# Patient Record
Sex: Male | Born: 1940
Health system: Southern US, Community
[De-identification: ages and names within clinical notes are randomized; demographics above are authoritative.]

## PROBLEM LIST (undated history)

## (undated) DIAGNOSIS — N62 Hypertrophy of breast: Secondary | ICD-10-CM

## (undated) DIAGNOSIS — F32A Depression, unspecified: Secondary | ICD-10-CM

## (undated) DIAGNOSIS — I4510 Unspecified right bundle-branch block: Secondary | ICD-10-CM

## (undated) DIAGNOSIS — M1990 Unspecified osteoarthritis, unspecified site: Secondary | ICD-10-CM

## (undated) DIAGNOSIS — Z720 Tobacco use: Secondary | ICD-10-CM

## (undated) DIAGNOSIS — M4850XA Collapsed vertebra, not elsewhere classified, site unspecified, initial encounter for fracture: Secondary | ICD-10-CM

## (undated) DIAGNOSIS — N400 Enlarged prostate without lower urinary tract symptoms: Secondary | ICD-10-CM

## (undated) DIAGNOSIS — R739 Hyperglycemia, unspecified: Secondary | ICD-10-CM

## (undated) DIAGNOSIS — J449 Chronic obstructive pulmonary disease, unspecified: Secondary | ICD-10-CM

## (undated) DIAGNOSIS — Z87442 Personal history of urinary calculi: Secondary | ICD-10-CM

## (undated) DIAGNOSIS — I209 Angina pectoris, unspecified: Secondary | ICD-10-CM

## (undated) DIAGNOSIS — IMO0001 Reserved for inherently not codable concepts without codable children: Secondary | ICD-10-CM

## (undated) DIAGNOSIS — F329 Major depressive disorder, single episode, unspecified: Secondary | ICD-10-CM

## (undated) DIAGNOSIS — G4733 Obstructive sleep apnea (adult) (pediatric): Secondary | ICD-10-CM

## (undated) DIAGNOSIS — M549 Dorsalgia, unspecified: Secondary | ICD-10-CM

## (undated) DIAGNOSIS — Z9989 Dependence on other enabling machines and devices: Secondary | ICD-10-CM

## (undated) DIAGNOSIS — E538 Deficiency of other specified B group vitamins: Secondary | ICD-10-CM

## (undated) DIAGNOSIS — G8929 Other chronic pain: Secondary | ICD-10-CM

## (undated) DIAGNOSIS — E119 Type 2 diabetes mellitus without complications: Secondary | ICD-10-CM

## (undated) DIAGNOSIS — I2510 Atherosclerotic heart disease of native coronary artery without angina pectoris: Secondary | ICD-10-CM

## (undated) DIAGNOSIS — M5134 Other intervertebral disc degeneration, thoracic region: Secondary | ICD-10-CM

## (undated) DIAGNOSIS — R7303 Prediabetes: Secondary | ICD-10-CM

## (undated) DIAGNOSIS — M51379 Other intervertebral disc degeneration, lumbosacral region without mention of lumbar back pain or lower extremity pain: Secondary | ICD-10-CM

## (undated) DIAGNOSIS — I219 Acute myocardial infarction, unspecified: Secondary | ICD-10-CM

## (undated) DIAGNOSIS — E785 Hyperlipidemia, unspecified: Secondary | ICD-10-CM

## (undated) DIAGNOSIS — M5030 Other cervical disc degeneration, unspecified cervical region: Secondary | ICD-10-CM

## (undated) DIAGNOSIS — F419 Anxiety disorder, unspecified: Secondary | ICD-10-CM

## (undated) DIAGNOSIS — K219 Gastro-esophageal reflux disease without esophagitis: Secondary | ICD-10-CM

## (undated) DIAGNOSIS — K279 Peptic ulcer, site unspecified, unspecified as acute or chronic, without hemorrhage or perforation: Secondary | ICD-10-CM

## (undated) DIAGNOSIS — M545 Low back pain, unspecified: Secondary | ICD-10-CM

## (undated) DIAGNOSIS — D369 Benign neoplasm, unspecified site: Secondary | ICD-10-CM

## (undated) DIAGNOSIS — M5137 Other intervertebral disc degeneration, lumbosacral region: Secondary | ICD-10-CM

## (undated) HISTORY — DX: Other chronic pain: G89.29

## (undated) HISTORY — PX: CORONARY ANGIOPLASTY: SHX604

## (undated) HISTORY — DX: Benign neoplasm, unspecified site: D36.9

## (undated) HISTORY — DX: Depression, unspecified: F32.A

## (undated) HISTORY — DX: Benign prostatic hyperplasia without lower urinary tract symptoms: N40.0

## (undated) HISTORY — PX: CARDIAC CATHETERIZATION: SHX172

## (undated) HISTORY — DX: Major depressive disorder, single episode, unspecified: F32.9

## (undated) HISTORY — PX: JOINT REPLACEMENT: SHX530

## (undated) HISTORY — DX: Hyperglycemia, unspecified: R73.9

## (undated) HISTORY — DX: Hypertrophy of breast: N62

## (undated) HISTORY — DX: Deficiency of other specified B group vitamins: E53.8

## (undated) HISTORY — PX: CATARACT EXTRACTION W/ INTRAOCULAR LENS  IMPLANT, BILATERAL: SHX1307

## (undated) HISTORY — DX: Low back pain, unspecified: M54.50

## (undated) HISTORY — DX: Low back pain: M54.5

## (undated) HISTORY — PX: EYE SURGERY: SHX253

## (undated) HISTORY — PX: PENILE PROSTHESIS IMPLANT: SHX240

## (undated) HISTORY — PX: COLONOSCOPY W/ POLYPECTOMY: SHX1380

## (undated) HISTORY — DX: Atherosclerotic heart disease of native coronary artery without angina pectoris: I25.10

## (undated) HISTORY — DX: Collapsed vertebra, not elsewhere classified, site unspecified, initial encounter for fracture: M48.50XA

---
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
## 1968-02-26 DIAGNOSIS — M4850XA Collapsed vertebra, not elsewhere classified, site unspecified, initial encounter for fracture: Secondary | ICD-10-CM

## 1968-02-26 HISTORY — DX: Collapsed vertebra, not elsewhere classified, site unspecified, initial encounter for fracture: M48.50XA

## 1975-02-26 HISTORY — PX: NASAL SEPTUM SURGERY: SHX37

## 1999-03-26 ENCOUNTER — Encounter: Payer: Self-pay | Admitting: Urology

## 1999-03-30 ENCOUNTER — Observation Stay (HOSPITAL_COMMUNITY): Admission: RE | Admit: 1999-03-30 | Discharge: 1999-03-31 | Payer: Self-pay | Admitting: Urology

## 1999-08-26 HISTORY — PX: SCROTAL SURGERY: SHX2387

## 1999-09-17 ENCOUNTER — Ambulatory Visit (HOSPITAL_BASED_OUTPATIENT_CLINIC_OR_DEPARTMENT_OTHER): Admission: RE | Admit: 1999-09-17 | Discharge: 1999-09-17 | Payer: Self-pay | Admitting: Urology

## 2001-01-30 ENCOUNTER — Encounter: Payer: Self-pay | Admitting: Rheumatology

## 2001-01-30 ENCOUNTER — Encounter: Admission: RE | Admit: 2001-01-30 | Discharge: 2001-01-30 | Payer: Self-pay | Admitting: Rheumatology

## 2001-02-13 ENCOUNTER — Ambulatory Visit (HOSPITAL_COMMUNITY): Admission: RE | Admit: 2001-02-13 | Discharge: 2001-02-13 | Payer: Self-pay | Admitting: Cardiovascular Disease

## 2001-02-13 ENCOUNTER — Encounter: Payer: Self-pay | Admitting: Cardiovascular Disease

## 2001-02-16 ENCOUNTER — Encounter: Admission: RE | Admit: 2001-02-16 | Discharge: 2001-02-16 | Payer: Self-pay | Admitting: Rheumatology

## 2001-02-16 ENCOUNTER — Encounter: Payer: Self-pay | Admitting: Rheumatology

## 2001-02-25 HISTORY — PX: BYPASS GRAFT POPLITEAL TO POPLITEAL: SHX5763

## 2001-03-13 ENCOUNTER — Inpatient Hospital Stay (HOSPITAL_COMMUNITY): Admission: RE | Admit: 2001-03-13 | Discharge: 2001-03-15 | Payer: Self-pay

## 2005-02-25 DIAGNOSIS — D369 Benign neoplasm, unspecified site: Secondary | ICD-10-CM

## 2005-02-25 HISTORY — DX: Benign neoplasm, unspecified site: D36.9

## 2005-05-23 ENCOUNTER — Encounter: Admission: RE | Admit: 2005-05-23 | Discharge: 2005-05-23 | Payer: Self-pay | Admitting: Rheumatology

## 2005-05-26 HISTORY — PX: LOWER EXTREMITY ANGIOGRAM: SHX5955

## 2005-06-12 ENCOUNTER — Ambulatory Visit (HOSPITAL_COMMUNITY): Admission: RE | Admit: 2005-06-12 | Discharge: 2005-06-12 | Payer: Self-pay | Admitting: Vascular Surgery

## 2005-06-25 HISTORY — PX: BYPASS GRAFT POPLITEAL TO POPLITEAL: SHX5763

## 2005-06-27 ENCOUNTER — Inpatient Hospital Stay (HOSPITAL_COMMUNITY): Admission: RE | Admit: 2005-06-27 | Discharge: 2005-06-29 | Payer: Self-pay | Admitting: Vascular Surgery

## 2006-02-28 ENCOUNTER — Encounter: Admission: RE | Admit: 2006-02-28 | Discharge: 2006-02-28 | Payer: Self-pay | Admitting: Rheumatology

## 2006-05-06 ENCOUNTER — Encounter: Admission: RE | Admit: 2006-05-06 | Discharge: 2006-05-06 | Payer: Self-pay | Admitting: Geriatric Medicine

## 2006-05-14 ENCOUNTER — Encounter: Admission: RE | Admit: 2006-05-14 | Discharge: 2006-05-14 | Payer: Self-pay | Admitting: Geriatric Medicine

## 2006-11-28 ENCOUNTER — Encounter: Admission: RE | Admit: 2006-11-28 | Discharge: 2006-11-28 | Payer: Self-pay | Admitting: Geriatric Medicine

## 2007-02-26 DIAGNOSIS — I2510 Atherosclerotic heart disease of native coronary artery without angina pectoris: Secondary | ICD-10-CM

## 2007-02-26 HISTORY — DX: Atherosclerotic heart disease of native coronary artery without angina pectoris: I25.10

## 2007-10-02 ENCOUNTER — Ambulatory Visit: Payer: Self-pay | Admitting: Vascular Surgery

## 2007-11-26 HISTORY — PX: ESOPHAGOGASTRODUODENOSCOPY (EGD) WITH ESOPHAGEAL DILATION: SHX5812

## 2007-12-01 ENCOUNTER — Encounter: Admission: RE | Admit: 2007-12-01 | Discharge: 2007-12-01 | Payer: Self-pay | Admitting: Cardiology

## 2007-12-04 ENCOUNTER — Ambulatory Visit (HOSPITAL_COMMUNITY): Admission: RE | Admit: 2007-12-04 | Discharge: 2007-12-04 | Payer: Self-pay | Admitting: Cardiology

## 2007-12-17 ENCOUNTER — Encounter (INDEPENDENT_AMBULATORY_CARE_PROVIDER_SITE_OTHER): Payer: Self-pay | Admitting: Gastroenterology

## 2007-12-17 ENCOUNTER — Ambulatory Visit (HOSPITAL_COMMUNITY): Admission: RE | Admit: 2007-12-17 | Discharge: 2007-12-17 | Payer: Self-pay | Admitting: Gastroenterology

## 2008-12-15 ENCOUNTER — Ambulatory Visit: Payer: Self-pay | Admitting: Vascular Surgery

## 2009-01-25 HISTORY — PX: TOTAL KNEE ARTHROPLASTY: SHX125

## 2009-02-06 ENCOUNTER — Inpatient Hospital Stay (HOSPITAL_COMMUNITY): Admission: RE | Admit: 2009-02-06 | Discharge: 2009-02-08 | Payer: Self-pay | Admitting: Orthopedic Surgery

## 2009-02-12 ENCOUNTER — Ambulatory Visit: Payer: Self-pay | Admitting: Vascular Surgery

## 2009-02-12 ENCOUNTER — Encounter (INDEPENDENT_AMBULATORY_CARE_PROVIDER_SITE_OTHER): Payer: Self-pay | Admitting: Emergency Medicine

## 2009-02-12 ENCOUNTER — Ambulatory Visit (HOSPITAL_COMMUNITY): Admission: EM | Admit: 2009-02-12 | Discharge: 2009-02-12 | Payer: Self-pay | Admitting: Emergency Medicine

## 2009-06-25 DIAGNOSIS — N62 Hypertrophy of breast: Secondary | ICD-10-CM

## 2009-06-25 HISTORY — DX: Hypertrophy of breast: N62

## 2009-07-05 ENCOUNTER — Encounter: Admission: RE | Admit: 2009-07-05 | Discharge: 2009-07-05 | Payer: Self-pay | Admitting: Geriatric Medicine

## 2009-11-08 ENCOUNTER — Encounter: Admission: RE | Admit: 2009-11-08 | Discharge: 2009-11-08 | Payer: Self-pay | Admitting: Geriatric Medicine

## 2010-03-18 ENCOUNTER — Encounter: Payer: Self-pay | Admitting: Geriatric Medicine

## 2010-05-28 LAB — DIFFERENTIAL
Basophils Absolute: 0 10*3/uL (ref 0.0–0.1)
Basophils Relative: 1 % (ref 0–1)
Eosinophils Absolute: 0.1 10*3/uL (ref 0.0–0.7)
Eosinophils Relative: 2 % (ref 0–5)
Lymphocytes Relative: 25 % (ref 12–46)
Lymphs Abs: 1.4 10*3/uL (ref 0.7–4.0)
Monocytes Absolute: 0.6 K/uL (ref 0.1–1.0)
Monocytes Relative: 11 % (ref 3–12)
Neutro Abs: 3.5 K/uL (ref 1.7–7.7)
Neutrophils Relative %: 62 % (ref 43–77)

## 2010-05-28 LAB — CBC
HCT: 28 % — ABNORMAL LOW (ref 39.0–52.0)
Hemoglobin: 9.6 g/dL — ABNORMAL LOW (ref 13.0–17.0)
MCHC: 34.4 g/dL (ref 30.0–36.0)
MCV: 91.9 fL (ref 78.0–100.0)
Platelets: 250 10*3/uL (ref 150–400)
RBC: 3.05 MIL/uL — ABNORMAL LOW (ref 4.22–5.81)
RDW: 14.2 % (ref 11.5–15.5)
WBC: 5.7 10*3/uL (ref 4.0–10.5)

## 2010-05-28 LAB — POCT CARDIAC MARKERS
CKMB, poc: 2.7 ng/mL (ref 1.0–8.0)
Myoglobin, poc: 278 ng/mL (ref 12–200)
Troponin i, poc: <0.05 ng/mL (ref 0.00–0.09)

## 2010-05-28 LAB — APTT: aPTT: 34 seconds (ref 24–37)

## 2010-05-28 LAB — PROTIME-INR
INR: 1.72 — ABNORMAL HIGH (ref 0.00–1.49)
Prothrombin Time: 20 s — ABNORMAL HIGH (ref 11.6–15.2)

## 2010-05-29 LAB — CBC
HCT: 32.1 % — ABNORMAL LOW (ref 39.0–52.0)
Hemoglobin: 11.1 g/dL — ABNORMAL LOW (ref 13.0–17.0)
Hemoglobin: 14.4 g/dL (ref 13.0–17.0)
MCHC: 34.7 g/dL (ref 30.0–36.0)
MCHC: 35.3 g/dL (ref 30.0–36.0)
MCV: 92 fL (ref 78.0–100.0)
MCV: 92.8 fL (ref 78.0–100.0)
Platelets: 134 10*3/uL — ABNORMAL LOW (ref 150–400)
Platelets: 155 10*3/uL (ref 150–400)
Platelets: 197 10*3/uL (ref 150–400)
RBC: 3.34 MIL/uL — ABNORMAL LOW (ref 4.22–5.81)
RDW: 13.5 % (ref 11.5–15.5)
RDW: 13.7 % (ref 11.5–15.5)
WBC: 6.4 10*3/uL (ref 4.0–10.5)

## 2010-05-29 LAB — URINALYSIS, ROUTINE W REFLEX MICROSCOPIC
Bilirubin Urine: NEGATIVE
Hgb urine dipstick: NEGATIVE
Protein, ur: NEGATIVE mg/dL
Specific Gravity, Urine: 1.018 (ref 1.005–1.030)
pH: 6.5 (ref 5.0–8.0)

## 2010-05-29 LAB — DIFFERENTIAL
Eosinophils Relative: 3 % (ref 0–5)
Lymphocytes Relative: 36 % (ref 12–46)
Monocytes Relative: 8 % (ref 3–12)
Neutrophils Relative %: 52 % (ref 43–77)

## 2010-05-29 LAB — COMPREHENSIVE METABOLIC PANEL
ALT: 22 U/L (ref 0–53)
AST: 23 U/L (ref 0–37)
Albumin: 3.7 g/dL (ref 3.5–5.2)
Alkaline Phosphatase: 73 U/L (ref 39–117)
BUN: 12 mg/dL (ref 6–23)
CO2: 28 mEq/L (ref 19–32)
Calcium: 9.1 mg/dL (ref 8.4–10.5)
Chloride: 101 mEq/L (ref 96–112)
Creatinine, Ser: 0.78 mg/dL (ref 0.4–1.5)
Glucose, Bld: 93 mg/dL (ref 70–99)
Total Bilirubin: 0.6 mg/dL (ref 0.3–1.2)
Total Protein: 6.8 g/dL (ref 6.0–8.3)

## 2010-05-29 LAB — PROTIME-INR
INR: 1 (ref 0.00–1.49)
INR: 1.39 (ref 0.00–1.49)
Prothrombin Time: 16.9 seconds — ABNORMAL HIGH (ref 11.6–15.2)

## 2010-05-29 LAB — BASIC METABOLIC PANEL
BUN: 10 mg/dL (ref 6–23)
BUN: 8 mg/dL (ref 6–23)
CO2: 27 mEq/L (ref 19–32)
CO2: 29 mEq/L (ref 19–32)
Calcium: 8.6 mg/dL (ref 8.4–10.5)
Chloride: 99 mEq/L (ref 96–112)
Creatinine, Ser: 0.82 mg/dL (ref 0.4–1.5)
GFR calc Af Amer: >60 mL/min (ref >60)
GFR calc non Af Amer: >60 mL/min (ref >60)
Glucose, Bld: 149 mg/dL — ABNORMAL HIGH (ref 70–99)
Glucose, Bld: 150 mg/dL — ABNORMAL HIGH (ref 70–99)
Potassium: 3.9 mEq/L (ref 3.5–5.1)
Sodium: 133 mEq/L — ABNORMAL LOW (ref 135–145)

## 2010-05-29 LAB — URINE CULTURE: Culture: NO GROWTH

## 2010-05-29 LAB — APTT: aPTT: 31 seconds (ref 24–37)

## 2010-05-29 LAB — URINE MICROSCOPIC-ADD ON

## 2010-05-29 LAB — TYPE AND SCREEN: Antibody Screen: NEGATIVE

## 2010-07-10 NOTE — Procedures (Signed)
BYPASS GRAFT EVALUATION   INDICATION:  Followup left lower extremity bypass graft.   HISTORY:  Diabetes:  No.  Cardiac:  CAD.  Hypertension:  Yes.  Smoking:  Yes.  Previous Surgery:  Left superficial femoral artery to popliteal artery  bypass graft on 06/27/2005.   SINGLE LEVEL ARTERIAL EXAM                               RIGHT              LEFT  Brachial:                    131                139  Anterior tibial:             139                153  Posterior tibial:            172                176  Peroneal:  Ankle/brachial index:        1.24               1.27   PREVIOUS ABI:  Date:  01/31/2006  RIGHT:  >1.0  LEFT:  >1.0   LOWER EXTREMITY BYPASS GRAFT DUPLEX EXAM:   DUPLEX:  Biphasic Doppler waveforms noted throughout the left lower  extremity bypass graft and its native vessels with no increase in  Doppler velocities noted.   IMPRESSION:  1. Patent left superficial femoral to popliteal artery bypass graft      with no evidence of stenosis.  2. Stable bilateral ankle brachial indices noted.   ___________________________________________  Larina Earthly, M.D.   CH/MEDQ  D:  10/02/2007  T:  10/02/2007  Job:  [id number]

## 2010-07-10 NOTE — Op Note (Signed)
[name], [name] [name] NO.:  [id number]   MEDICAL RECORD NO.:  [id number]          PATIENT TYPE:  AMB   LOCATION:  ENDO                         FACILITY:  [facility]   PHYSICIAN:  Petra Kuba, M.D.    DATE OF BIRTH:  1940-04-09   DATE OF PROCEDURE:  12/17/2007  DATE OF DISCHARGE:                               OPERATIVE REPORT   PROCEDURE:  Esophagogastroduodenoscopy with Savary dilatation.   INDICATIONS:  Patient with atypical chest pain, dysphagia.   Consent was signed after risks, benefits, methods and options thoroughly  discussed multiple times in the past.   MEDICINES USED:  Per Anesthesia, a combination of Diprivan and other  medicines.   DESCRIPTION OF PROCEDURE:  The video endoscope was inserted via direct  vision.  He had a tortuous esophagus, but no obvious hiatal hernia,  stricture or ring.  The scope passed into the stomach and advanced  through the antrum, pertinent for some atrophic gastritis.  Advanced  through a normal pylorus into a normal duodenal bulb and around the C-  loop to a normal second portion of the duodenum.  The scope was  withdrawn back to the bulb and a good look there ruled out abnormalities  in that location.  The scope was withdrawn back to the stomach and  retroflexed.  The cardia, fundus, angularis, lesser and greater curve  were all normal on retroflexed visualization, except for some atrophic  gastritis.  Straight visualization of the stomach did not reveal any  additional finding.  The scope was then slowly withdrawn back to the  posterior pharynx, again confirming the normal esophagus with some  tortuosity without any obvious stricture, ring, blockage or signs of  significant reflux.  The scope was reinserted to the atrium and the  Savary wire was advanced.  Customary J-loop was confirmed  endoscopically.  The scope was removed, making sure to advance the wire  in 1:1 customary method.  Once the scope was removed, marks  were  confirmed at the 4 mark, which equals 80 cm on the wire, Savary 16 Foley  was advanced without resistance.  The wire was withdrawn back in the  dilator which were removed in tandem.  There was a trace amount of heme  on the dilator.  The procedure was terminated at this junction.  The  patient tolerated the procedure well.  There was no immediate  complication.   ENDOSCOPIC DIAGNOSES:  1. Tortuous esophagus, but no obvious stricture or ring, status post      Savary dilatation to 16 only without fluoro.  2. Mild atrophic gastritis and antritis.  3. Otherwise normal esophagogastroduodenoscopy.   PLAN:  Follow up p.r.n. or in 6 weeks to recheck symptoms to make sure  no further workup plans are needed, but possibly a barium swallow if  dysphagia continues and a barium tablet.           ______________________________  Petra Kuba, M.D.     MEM/MEDQ  D:  12/17/2007  T:  12/17/2007  Job:  [id number]   cc:   Hal T.  Stoneking, M.D.  Fax: [phone]

## 2010-07-10 NOTE — Op Note (Signed)
NAMENADIA, TORR NO.:  [id number]   MEDICAL RECORD NO.:  [id number]          PATIENT TYPE:  AMB   LOCATION:  ENDO                         FACILITY:  [facility]   PHYSICIAN:  Petra Kuba, M.D.    DATE OF BIRTH:  [birth year]   DATE OF PROCEDURE:  12/17/2007  DATE OF DISCHARGE:                               OPERATIVE REPORT   PROCEDURE:  Colonoscopy with polypectomy.   INDICATION:  Patient with history of multiple polyps, due for colonic  screening.  Consent was signed after risks, benefits, methods, options  thoroughly discussed multiple times in the past.   MEDICINES USED:  Per anesthesia.  A combination of Diprivan, etc.   PROCEDURE:  Rectal inspection was pertinent for external hemorrhoids.  Genitalia exam was negative.  The video colonoscope was inserted and  displayed a fair preparation with a long looping colon, with abdominal  pressure was able to be advanced into the cecum.  No significant  abnormality could be seen on insertion.  The cecum was identified  by  the appendiceal orifice and the ileocecal valve.  In fact the scope was  inserted a short ways into the terminal ileum which was normal.  Photo  documentation was obtained.  The scope was slowly withdrawn.  A long  time of lots of washing and suctioning was done to get adequate  visualization on withdrawal, over a liter was suctioned and washed.  On  slow withdrawal a small cecal sessile polyp was seen, carefully snared  and with the setting 150 and 15 was removed, suctioned through the scope  and collected in the trap.  Another small semi-sessile polyp was seen in  the hepatic flexure and this was snared at the same setting, suctioned  through the scope and collected in the trap, those were put in the same  container.  On slow withdrawal through the rest of the colon there were  2 small transverse polyps that were seen which were hot biopsied x1 and  3 tiny descending and sigmoid polyps which were  cold biopsied, the  transverse and descending and sigmoid, were all put in the same  container.  No other abnormalities were seen as we slowly withdrew back  to the rectum.  Anorectal pull through in retroflexion confirmed some  small hemorrhoids.  The scope was straightened and readvanced a short  ways into the left side of the colon, air was suctioned, the scope  removed.  The patient tolerated the procedure well, there was no obvious  immediate complication.   ENDOSCOPIC DIAGNOSES:  1. Internal, external hemorrhoids.  2. Three tiny sigmoid and descending polyps cold biopsied.  3. Three small transverse semi-sessile polyps hot biopsied.  4. Two cecal and hepatic flexure small polyps snared on the lower      setting.  5. Otherwise within normal limits to the terminal ileum.   PLAN:  Await pathology to determine future colonic screening, will  definitely use Diprivan on repeat screening and continue workup with an  EGD and probable dilation.  Please see that dictation for other workup  plans, etc.  No aspirin, no nonsteroidals for 10 days.           ______________________________  Petra Kuba, M.D.     MEM/MEDQ  D:  12/17/2007  T:  12/17/2007  Job:  [id number]   cc:   Petra Kuba, M.D.  Fax: [phone]   Hal T. Stoneking, M.D.  Fax: [phone]

## 2010-07-10 NOTE — Cardiovascular Report (Signed)
[name] [name], PIPE NO.:  [id number]   MEDICAL RECORD NO.:  [id number]          PATIENT TYPE:  OIB   LOCATION:  2899                         FACILITY:  [facility]   PHYSICIAN:  Madaline Savage, M.D.DATE OF BIRTH:  1941-02-15   DATE OF PROCEDURE:  DATE OF DISCHARGE:  12/04/2007                            CARDIAC CATHETERIZATION   PROCEDURES PERFORMED:  1. Selective coronary angiography by Judkins technique.  2. Retrograde left heart catheterization.  3. Left ventricular angiography.   COMPLICATIONS:  None.   ENTRY SITE:  Right femoral.   DYE USED:  Omnipaque.   PATIENT PROFILE:  The patient is a 70 year old married white gentleman  from [place], [place], who is a medical patient of Dr. Merlene Laughter.  It is pertinent that he has had previous cardiac  catheterization in 1991 showing a 90% right coronary artery stenosis and  a circumflex stenosis of 50% with normal LV systolic function.  He had  had an inferior myocardial infarction in 1993 with an occluded right  coronary artery, but he had circumflex collateralization and normal LV  systolic function; therefore, he was treated medically.   The patient has a blood pressure now of 261 pounds and a BMI of 38.  He  is 5 feet 9 inches tall.  He has had recent chest pain that is sharp and  burning as well.  We discussed options of stress testing versus cath,  and the patient chose cath and we came to [facility] for outpatient  catheterization today.  No complications occurred.  Results showed LV  pressure to be 162/11 with an end-diastolic pressure of 18.  Central  aortic pressure 140/80, mean of 105.  There was no record made of the  pullback pressure from left ventricle and central aorta.   ANGIOGRAPHIC RESULTS:  The patient has faint calcification in the mid  circumflex coronary artery.  No calcification anywhere else.  Left  ventricular angiogram showed vigorous contractility of all wall segments  except for inferior wall which was very mildly hypokinetic.  Ejection  fraction estimate 55-60%.  No mitral regurgitation.   Coronary artery anatomy shows that there is a widely patent LAD with a  septal perforator branch and a diagonal, all of which appear normal.  He  has a circumflex coronary artery that is nondominant, which provides  marked collateralization of the distal RCA.  You can actually see 3  segments of the RCA filled by way of these collaterals from his distal  circumflex.   The right coronary artery is 100% occluded, just after the pulmonary  conus branch and sinus node branch.  There is really not much in the way  of antegrade collateral flow in a right to right fashion.   FINAL IMPRESSIONS:  Essentially single-vessel coronary artery disease  with 100% occlusion of the proximal right coronary artery with  collateralization from a patent circumflex coronary artery with good  left ventricular systolic function.   PLAN:  The patient will be recovered in our short-stay unit at [facility] and  be a candidate for discharge later today  and will follow up with me in  the office at a later time for further recommendations and therapy.           ______________________________  Madaline Savage, M.D.     WHG/MEDQ  D:  12/04/2007  T:  12/04/2007  Job:  [id number]   cc:   Hal T. Stoneking, M.D.  [facility]

## 2010-07-10 NOTE — Procedures (Signed)
BYPASS GRAFT EVALUATION   INDICATION:  Followup femoral-popliteal artery bypass graft.   HISTORY:  Diabetes:  No.  Cardiac:  Coronary artery disease, MI x2.  Hypertension:  No.  Smoking:  Yes.  Previous Surgery:  Left superficial femoral artery to popliteal artery  bypass graft on 06/27/2005.   SINGLE LEVEL ARTERIAL EXAM                               RIGHT              LEFT  Brachial:                    159                160  Anterior tibial:             155                176  Posterior tibial:            167                170  Peroneal:  Ankle/brachial index:        1.04               1.10   PREVIOUS ABI:  Date:  10/02/2007  RIGHT:  1.24  LEFT:  1.27   LOWER EXTREMITY BYPASS GRAFT DUPLEX EXAM:   DUPLEX:  The left superficial femoral artery to popliteal artery bypass  graft appears with triphasic waveforms proximal, within and distally.   IMPRESSION:  1. Stable ankle brachial indices noted from previous study.  2. Patent left superficial femoral artery to popliteal artery bypass      graft with no stenosis found.   ___________________________________________  Larina Earthly, M.D.   CB/MEDQ  D:  12/15/2008  T:  12/16/2008  Job:  [id number]

## 2010-07-13 NOTE — Discharge Summary (Signed)
[name] [name], [name] [name] NO.:  [id number]   MEDICAL RECORD NO.:  [id number]          PATIENT TYPE:  INP   LOCATION:  2001                         FACILITY:  [facility]   PHYSICIAN:  Larina Earthly, M.D.    DATE OF BIRTH:  1940/12/14   DATE OF ADMISSION:  06/27/2005  DATE OF DISCHARGE:  06/29/2005                                 DISCHARGE SUMMARY   HISTORY OF PRESENT ILLNESS:  The patient is a 70 year old, white male who  over the past year has had problems with worsening left knee pain.  The  patient describes stiffness as well as difficulty straightening his leg and  pain in the knee cap area with ambulation.  He was seen by Dr. Pete Glatter and  subsequently referred to Dr. Thurston Hole for further evaluation.  The patient  underwent plain film of the knee as well as MRI and this showed a nearly 3  cm, left popliteal aneurysm.  He was treated with steroid injection by Dr.  Thurston Hole for some degenerative joint disease which was also noted in the left  knee and subsequently referred to Dr. Arbie Cookey for evaluation of the aneurysm.  Of note, the patient has a previous  history of right popliteal aneurysm  repair about 5 years ago by Dr. Orson Slick.  The patient denied any other  symptoms of claudication, calf, hip or buttock pain, rest pain, peripheral  pain or nonhealing lower extremity ulcers.  Dr. Arbie Cookey reviewed his films and  felt that the best course of action would be to proceed with popliteal  aneurysm repair and he was admitted this hospitalization for the procedure.   PAST MEDICAL HISTORY:  1.  Hyperlipidemia.  2.  Coronary artery disease, status post myocardial infarction x2.  3.  Degenerative joint disease of the left knee with meniscal tear by MRI.  4.  Peptic ulcer disease.  5.  Questionable history of irregular heart beat.   PAST SURGICAL HISTORY:  1.  Right popliteal artery aneurysm repair in 2002, by Dr. Orson Slick.  2.  Septoplasty in 1977.  3.  Penile implant in 2000.  4.   Vasectomy in 1972.   MEDICATIONS PRIOR TO ADMISSION:  1.  Felodipine ER 10 mg daily.  2.  Crestor 20 mg daily.  3.  Wellbutrin 300 mg daily.  4.  Isosorbide dinitrate 20 mg two tablets every day.  5.  Tylenol t.i.d. and p.r.n.  6.  Centrum Silver one daily.  7.  Aspirin 81 mg daily.  8.  Protonix 40 mg daily.   ALLERGIES:  SULFA which causes itching and rash.   For family history, social history, review of systems and physical, please  see the History and Physical done at the time of admission.   HOSPITAL COURSE:  The patient was admitted electively on Jun 27, 2005, and  taken to the operating room where he underwent repair of left popliteal  artery aneurysm with above-knee to below-knee popliteal artery bypass using  reverse saphenous vein graft from the lower left extremity.  The patient  tolerated the procedure well and was taken  to the post anesthesia care unit  in stable condition.   Postoperatively, the patient has done well.  He has had some ongoing  stiffness and pain in the left knee which has led to some difficulty with  ambulation.  From a vascular view point, he has Doppler signals in both the  left posterior tibial and dorsalis pedis arteries.  The incision is healing  well without signs of infection.  He does have some edema in the extremity.  From a laboratory view point, his hemoglobin and hematocrit are quite stable  at 13.6 and 38.5 respectively.  Electrolytes, BUN and creatinine are all  within normal limits.  His overall status is felt that he will be stable for  discharge on the morning of Jun 29, 2005, pending morning rounds re-  evaluation and primarily how he is doing in regards to his mobilization.   DISCHARGE MEDICATIONS:  1.  Percocet 5/325 one to two every 6 hours as needed for pain.  2.  Preoperative medications.   SPECIAL INSTRUCTIONS:  The patient received written instructions in regards  to medications, activity, diet, wound care and followup.    FOLLOW UP:  Followup will include Dr. Arbie Cookey on Jul 15, 2005, at 2 p.m.   CONDITION ON DISCHARGE:  Stable and improving.   DISCHARGE DIAGNOSES:  1.  Left popliteal aneurysm, now status post repair with bypass as described      above.  2.  Hyperlipidemia.  3.  Coronary artery disease.  4.  Degenerative joint disease.  5.  History of meniscal tear, left knee by magnetic resonance imaging.  6.  History of peptic ulcer disease.  7.  History of possible irregular heart beat.  8.  Previous surgeries as mentioned in the above dictation.      Rowe Clack, P.A.-C.      Larina Earthly, M.D.  Electronically Signed    WEG/MEDQ  D:  06/28/2005  T:  06/29/2005  Job:  [id number]   cc:   Hal T. Stoneking, M.D.  Fax: [phone]   Elana Alm. Thurston Hole, M.D.  Fax: [phone]

## 2010-07-13 NOTE — H&P (Signed)
[name] [name], [name] [name] NO.:  [id number]   MEDICAL RECORD NO.:  [id number]          PATIENT TYPE:  INP   LOCATION:  NA                           FACILITY:  [facility]   PHYSICIAN:  Larina Earthly, M.D.    DATE OF BIRTH:  1940-08-10   DATE OF ADMISSION:  06/27/2005  DATE OF DISCHARGE:                                HISTORY & PHYSICAL   CHIEF COMPLAINT:  Left knee pain.   HISTORY OF PRESENT ILLNESS:  The patient is a 70 year old white male who  over the past year has had problems with worsening left knee pain.  He  reports left knee stiffness, difficulty straightening his leg, and pain in  the kneecap area with ambulation.  He has seen Dr. Pete Glatter and was  subsequently referred to Dr. Thurston Hole for further evaluation.  He has  undergone plain films of the knee as well as an MRI and this showed a nearly  3-cm left popliteal aneurysm.  He was treated with a steroid injection by  Dr. Thurston Hole for some degenerative joint disease which was also noted in the  left knee area and was subsequently referred to Dr. Tawanna Cooler Early for  evaluation of the aneurysm.  Of note, he has a previous history of a right  popliteal aneurysm repair around five years ago by Dr. Orson Slick.  He denies  other symptoms such as claudication, calf, hip, or buttock pain, rest pain,  peripheral edema, non-healing lower extremity ulcers.  Dr. Arbie Cookey reviewed  his films and felt that his best course of action would be to proceed with  popliteal aneurysm repair at this time.   PAST MEDICAL HISTORY:  1.  Hyperlipidemia.  2.  Coronary artery disease, status post myocardial infarction x2.  3.  Degenerative joint disease of the left knee with a meniscal tear by MRI.  4.  History of peptic ulcer disease.  5.  Questionable history of irregular heart beat.   PAST SURGICAL HISTORY:  1.  Right popliteal artery aneurysm repair in 2002 by Dr. Orson Slick.  2.  Septoplasty in 1977.  3.  Penile implant in 2000.  4.  Vasectomy in  1972.   MEDICATIONS:  1.  Felodipine ER 10 mg every day.  2.  Crestor 20 mg every day.  3.  Wellbutrin 300 mg every day.  4.  Isosorbide dinitrate 20 mg two tabs every day.  5.  Tylenol t.i.d. and p.r.n.  6.  Centrum Silver one every day.  7.  Aspirin 81 mg every day.  8.  Protonix 40 mg every day.   ALLERGIES:  SULFA which causes itching and a rash.   SOCIAL HISTORY:  He is married and has four children.  He resides in [place] with his wife.  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  He previously smoked one  to one and a half packs of cigarettes per day but has currently cut back to  one half pack.  He has smoked for 50 years.  He does not consume alcohol.   FAMILY HISTORY:  His mother died of Alzheimer's.  His father died of  coronary artery disease and diabetes mellitus.  He has one sister who has  fibromyalgia.  There is a strong history of coronary artery disease in his  paternal grandparents and his aunt.   REVIEW OF SYSTEMS:  See history of present illness for pertinent positives  and negatives.  Also, he has dyspnea with exertion.  He has seasonal  allergies.  He complains of some urinary urgency as well as nocturia of at  least two times per night.  He has chronic back pain and stiffness which he  attributes to arthritis.  He has decreased hearing and has a hearing aid  which he does not use regularly.  He wears glasses.  He denies fevers,  chills, weight loss, recent infections, visual changes, amaurosis fugax,  dysphagia, dysarthria, TIA symptoms, syncope, abdominal pain, nausea,  vomiting, diarrhea, constipation, reflux symptoms, hematochezia, melena,  hematuria, dysuria, nocturia, cough, wheezing, shortness of breath,  orthopnea, PND, hemoptysis, chest pain, palpitations, intolerance to heat or  cold, anxiety, depression, or other psychiatric disturbance.   PHYSICAL EXAMINATION:  VITAL SIGNS:  Blood pressure is 132/80, heart rate 84  and regular, respirations 16 and  unlabored.  GENERAL:  This is an obese white male in no acute distress.  HEENT:  Normocephalic atraumatic.  Pupils are equal, round, and react to  light and accommodation.  Extraocular movements intact.  TMs and canals are  clear.  Nares patent bilaterally.  Oropharynx is clear with moist mucous  membranes and upper dentures in place.  NECK:  Supple without lymphadenopathy, thyromegaly, or carotid bruits.  HEART:  Regular rate and rhythm without murmurs, rubs, or gallops.  LUNGS:  Clear to auscultation.  ABDOMEN:  Soft, obese, nontender, nondistended with active bowel sounds in  all quadrants.  No masses or hepatosplenomegaly.  EXTREMITIES:  No clubbing, cyanosis, or edema.  He has a well healed right  popliteal scar.  He has an easily palpable pulsatile mass in the left  popliteal area.  He has 2+ femoral, popliteal, dorsalis pedis, and posterior  tibial pulses.  NEUROLOGIC:  Cranial nerves II-XII grossly intact.  He is alert and oriented  x3.  Gait is within normal limits.  Upper and lower extremity strength is 5+  and symmetrical.   ASSESSMENT:  This is a 70 year old male with a left popliteal aneurysm.   PLAN:  He will be admitted to [facility], on Jun 29, 2005, and  undergo left popliteal aneurysm repair by Dr. Gretta Began.      Coral Ceo, P.A.      Larina Earthly, M.D.  Electronically Signed    GC/MEDQ  D:  06/21/2005  T:  06/21/2005  Job:  [id number]   cc:   Molly Maduro A. Thurston Hole, M.D.  Fax: [phone]   Hal T. Stoneking, M.D.  Fax: [phone]

## 2010-07-13 NOTE — Op Note (Signed)
NAMEJODI, [name] [name] NO.:  [id number]   MEDICAL RECORD NO.:  [id number]          PATIENT TYPE:  INP   LOCATION:  2001                         FACILITY:  [facility]   PHYSICIAN:  Larina Earthly, M.D.    DATE OF BIRTH:  [birth year]   DATE OF PROCEDURE:  06/27/2005  DATE OF DISCHARGE:                                 OPERATIVE REPORT   PREOPERATIVE DIAGNOSIS:  A 3 cm left popliteal artery aneurysm.   POSTOPERATIVE DIAGNOSIS:  A 3 cm left popliteal artery aneurysm.   OPERATION/PROCEDURE:  Exclusion of left popliteal artery aneurysm and bypass  with saphenous vein at the left popliteal artery aneurysm.   SURGEON:  Larina Earthly, M.D.   ASSISTANT:  Jerold Coombe, P.A.-C.   ANESTHESIA:  General endotracheal anesthesia.   COMPLICATIONS:  None.   DISPOSITION:  To the recovery room stable.   DESCRIPTION OF PROCEDURE:  The patient was taken to the operating room and  placed in the supine position.  The area of the left groin, left leg were  prepped and draped in the usual sterile fashion.  Incision was made over the  saphenous vein in the mid calf and a skin bridge was left and the vein was  exposed through a separate incision in the above-knee position.  The patient  had excellent caliber saphenous vein.  The tributary branches of the  saphenous vein were ligated with 3-0 and 4-0 silk ties and divided.  The  below-knee popliteal artery was exposed through the medial approach through  the same vein harvest incision and was of normal caliber.  The above-knee  popliteal artery and distal superficial artery junction were exposed and the  popliteal artery aneurysm was visualized in the above-knee and at-knee  position.  The artery above this had mild ectasia and was not aneurysmal.  A  tunnel was created from the level of the above-knee to below-knee popliteal  artery.  The saphenous vein was ligated distally and proximally and divided  and was gently dilated and was  found to be excellent conduit for bypass.  The caliber was relatively uniform and decision was made to reverse the  saphenous vein.  The below-knee popliteal artery was again exposed, was  ligated proximally and was occluded distally.  The patient had been given  8000 units of IV heparin.  The saphenous vein graft was reversed and  spatulated and sewn end-to-side to the below-knee popliteal artery with a  running 6-0 Prolene suture.  Clamps were removed after the usual flush  maneuvers and asbestos was completed and the graft was flushed with  heparinized saline.  The graft was then brought through the prior placed  anatomic tunnel up to the level of the superficial femoral artery.  The  superficial femoral artery was ligated distally with a 2-0 silk tie and was  occluded proximally with a vascular clamp.  The distal superficial femoral  artery was opened with _____ Windle Guard scissors.  The graft was cut to the  appropriate length with the knee straightened and was spatulated and sewn  end-to-side  to the artery with a running 6-0 Prolene suture.  Prior to  completion of the anastomosis, usual flushing maneuvers were undertaken.  The anastomosis was completed.  Flow was restored through the graft.  The  patient had a palpable dorsalis pedis pulse.  The patient was given 50 mg of  protamine to reverse the heparin.  Wounds were  irrigated with saline.  Hemostasis with electrocautery.  Wounds were closed  with 2-0 Vicryl to close the fascia and the skin was closed with 3-0  subcuticular Vicryl stitch.  Sterile dressing was applied.  The patient was  taken to the recovery room in stable condition.      Larina Earthly, M.D.  Electronically Signed     TFE/MEDQ  D:  06/27/2005  T:  06/28/2005  Job:  [id number]   cc:   Hal T. Stoneking, M.D.  Fax: [phone]   Madaline Savage, M.D.  Fax: [phone]   Elana Alm. Thurston Hole, M.D.  Fax: [phone]

## 2010-07-13 NOTE — Op Note (Signed)
[place]  Patient:    [name] [name], BURNSWORTH Visit Number: [id number] MRN: [id number]          Service Type: DSU Location: [facility] 2869 01 Attending Physician:  Ruta Hinds Dictated by:   Pearletha Furl Alanda Amass, M.D. Proc. Date: 02/13/01 Admit Date:  02/13/2001 Discharge Date: 02/13/2001   CC:         Harlow Mares, M.D., [facility]  Madaline Savage, M.D.  Zigmund Daniel, M.D.   Operative Report  PROCEDURE:  Retrograde abdominal aortic catheterization.  Abdominal aortic angiogram, midstream PA projection.  Right iliac angiography PA and oblique projections.  Bilateral lower extremity runoff using DSA cutfilm imaging and bolus chase technique.  Spot films right SFA popliteal.  DESCRIPTION OF PROCEDURE:  The patient was brought to the sixth floor PV lab in the postabsorptive state after 5 mg Valium p.o. premedication.  He was given 2 mg of Nubane for sedation in lab, 1% Xylocaine was used for local anesthesia.  Preoperative creatinine was 0.9.  The patient was hydrated preoperatively.  The RFA was entered with a single anterior puncture using 18 thin wall needle with modified Seldinger technique and a 5 French short Cordis sidearm sheath was inserted without difficulty.  Wooley wire was used to traverse the iliac system and 5 French pigtail catheter was placed above the level of the renal arteries.  Abdominal aortic angiogram was done in the midstream PA projection with DSA at 20 cc, 20 cc per second.  A second injection was done above the iliac bifurcation in the PA projection.  Right iliac angiography was done through the sidearm sheath in the PA and oblique projection by hand injection.  Bilateral lower extremity runoff was done with injection above the iliac bifurcation at 88 cc, 8 cc per second with DSA imaging and visualization using steptable "bolus chase" technique to the feet bilaterally.  Spot films of the right distal SFA and  popliteal were done in the PA and oblique projections using peak-hold technique with 8 to 10 cc injections x 2.  Catheters were removed, sidearm sheath was flushed.  The patient was brought to the holding area in stable condition where sidearm sheath was removed and pressure hemostasis applied on the right groin.  Arterial pressures were monitored throughout the procedure and ranged 140 to 160 over 80-85 mmHg.  Abdominal aortic angiogram in midstream PA projection revealed a widely patent proximal siliac and SMA axis.  The renal arteries were single and normal bilaterally.  Infrarenal abdominal aorta showed mild atherosclerotic eccentric changes on the right with no significant stenosis or aneurysmal formation.  RCIA had 30% eccentric narrowing, but a large residual lumen and was smooth throughout the proximal half.  There was an ulcerative lesion in the distal RCIA medially with no significant luminal narrowing or stenosis.  The right hypogastric was intact and the REIA was tortuous, but normal.  The LCIA had irregularities.  The left hypogastric was intact and the LEIA was tortuous and normal.  Bilateral SFA profunda junctions were normal.  There were mild irregularities in the distal third of the left SFA and segmental aneurysmal disease of approximately 10 to 12 mm of the distal left SFA popliteal ending in the mid popliteal before the joint line.  There was no significant stenosis or clot seen.  There was three vessel runoff to the LLE with a dominant PT artery.  The distal right SFA had 30% irregularity followed by a segmental lumpy bumpy aneurysmal  dilatation of the distal SFA and popliteal extending to the joint line with minimal dilatation beyond this.  This was approximately 20 mm in diameter at largest transverse.  There was no high grade stenosis and no clots or thrombus seen.  There is three vessel runoff to the right foot.  There was bilateral anterior tibial  irregularity with no significant stenosis.  BRIEF HISTORY:  Please refer to H&P and outpatient records.  This patient is a 70 year old smoker with exogenous obesity, COPD, and known coronary artery disease, hyperlipidemia, and hypertension.  He suffered a remote DMI in 1991 and was treated medically with hospitalization in [facility].  He has been followed cardiacwise by Madaline Savage, M.D. and his last catheterization was June of 1997 showing total right coronary artery occlusion with good collaterals from the left coronary system and no significant left coronary disease with EF approximately 55%.  In preparation for penile implant, he underwent Cardiolite February of 2002 which showed no ischemia and inferior scar.  The patient has had recent onset of right knee discomfort.  He has discomfort in the subpatellar region as well as into the posterior aspect of the knee.  It occurs at rest and is a throbbing sensation and occurs with exertion.  He has been seen by Demetria Pore. Levitin, M.D. who felt that this was probably not arthritic.  He has been on analgesics now and was referred to our office for Dopplers and lower extremity Duplex which demonstrated an LABI of 0.98, an RABI of 1.0 with aneurysmal dilatation segmentally of the right popliteal with greatest diameter of 20 mm.  Angiography reveals moderately large, complex, nonthrombotic aneurysm of the distal right SFA and popliteal.  This appears to approach 20 mm in diameter. His symptoms are somewhat difficult to interpret, but I believe may be compatible with pain from this aneurysm.  He has not had atheroembolic phenomenon clinically.  I would recommend vascular surgical consultation.  The patient has been counseled on necessity to discontinue smoking and will need lipid follow-up as well.  CATHETERIZATION DIAGNOSES:  1. Bilateral popliteal aneurysms, right greater than left - symptomatic right. 2. Three vessel runoff  bilaterally with no significant aortoiliac disease. 3. Systemic hypertension with normal renal arteries. 4. Exogenous obesity. 5. Chronic obstructive pulmonary disease, continued cigarette abuse. 6. Remote diaphragmatic myocardial infarction in 1991 treated medically,    negative Cardiolite February of 2002, single vessel disease on    catheterization in 1999. 7. Chronic right bundle branch block, asymptomatic. 8. Prior penile implant in February of 2001 with revision in July of 2002    (Dr. Brunilda Payor). 9. Hyperlipidemia. Dictated by:   Pearletha Furl Alanda Amass, M.D. Attending Physician:  Ruta Hinds DD:  02/13/01 TD:  02/15/01 Job: [id number]

## 2010-07-13 NOTE — Op Note (Signed)
[place]. [facility]  Patient:    [name] [name], [name] [name]                         MRN: [id number] Proc. Date: 09/17/99 Adm. Date:  [id number] Attending:  Lindaann Slough CC:         Madaline Savage, M.D.                           Operative Report  PREOPERATIVE DIAGNOSIS:  High scrotal migration of scrotal pump.  POSTOPERATIVE DIAGNOSIS:  High scrotal migration of scrotal pump.  OPERATION PERFORMED:  Revision of scrotal pump.  SURGEON:  Lindaann Slough, M.D.  ASSISTANT:  ANESTHESIA:  General.  INDICATIONS FOR PROCEDURE:  The patient is a 70 year old male who had a penile prosthesis inserted about five months ago.  The scrotal pump had migrated high in the scrotum and had been causing him some discomfort.  It is also difficult for him to inflate and deflate the penile prosthesis.  He is scheduled for revision of his scrotal pump.   DESCRIPTION OF PROCEDURE:  Under general anesthesia, the patient was prepped and draped and was placed in supine position.  The scrotal incision was made. The incision was carried down to the subcutaneous tissues and the ____________ pump was ____________ and freed from the surrounding tissues. Then a scrotal pouch was made in the most dependent portion of the scrotum. The wound was irrigated with normal saline.  Hemostasis was secured with electrocautery.  The ____________ pump was then placed in the scrotal pouch and secured with #2-0 Vicryl.  The wound was then closed in two layers with #3-0 Vicryl and 4-0 Vicryl.  ESTIMATED BLOOD LOSS:  Minimal.  Sponge, needle and instrument counts were correct.  The patient tolerated the procedure well and left the operating room in satisfactory condition to post anesthesia care unit. DD:  09/17/99 TD:  09/18/99 Job: [id number]

## 2010-07-13 NOTE — Op Note (Signed)
[name] [name], [name] [name]                  ACCOUNT NO.:  [id number]   MEDICAL RECORD NO.:  [id number]          PATIENT TYPE:  AMB   LOCATION:  SDS                          FACILITY:  [facility]   PHYSICIAN:  Larina Earthly, M.D.    DATE OF BIRTH:  [birth year]   DATE OF PROCEDURE:  06/12/2005  DATE OF DISCHARGE:  06/12/2005                                 OPERATIVE REPORT   PREOPERATIVE DIAGNOSIS:  Left popliteal artery aneurysm.   POSTOPERATIVE DIAGNOSIS:  Left popliteal artery aneurysm.   PROCEDURE:  Aortogram, bilateral lower extremity runoff.   SURGEON:  Larina Earthly, M.D.   ASSISTANT:  Nurse   ANESTHESIA:  1% lidocaine local.   COMPLICATIONS:  None.   DISPOSITION:  To holding area stable.   DESCRIPTION OF PROCEDURE:  The patient was taken to the peripheral vascular  catheterization lab, placed in the supine area where the area of both groins  were prepped and draped in the usual sterile fashion.  Using local  anesthesia and a single wall puncture, the right common femoral artery was  entered and a guidewire was placed up to the level of the suprarenal aorta.  A 5-French sheath catheter was passed over the guidewire.  A pigtail  catheter was positioned above the suprarenal aorta and AP projections were  undertaken.  This revealed widely patent renal arteries bilaterally.  There  was mild irregularity of infrarenal aorta with no evidence of aneurysmal  change.  Next, the pigtail catheter was pulled down to the level of the  aortic bifurcation and runoff was obtained through this injection.  There  was no evidence of any iliac artery aneurysm.  These vessels were widely  patent with no evidence of atherosclerotic disease.  The common femoral  arteries were opened bilaterally as were the superficial femoral arteries.  The patient had a prior right distal superficial femoral artery to below-  knee popliteal bypass for aneurysm.  There was some irregularity of the  superficial femoral  artery proximal to the bypass but no evidence of  stenosis.  The bypass was widely patent into the below-knee popliteal artery  with three-vessel runoff.  Next the left leg runoff revealed the popliteal  artery aneurysm at the above-knee position.  There was calcification of the  popliteal artery aneurysm with mural thrombus at the level of the knee.  There was three-vessel runoff on the left as well.  The patient tolerated  the procedure without immediate complications and was transferred to holding  area in stable condition.     Larina Earthly, M.D.  Electronically Signed    TFE/MEDQ  D:  06/12/2005  T:  06/12/2005  Job:  [id number]

## 2010-07-13 NOTE — Consult Note (Signed)
[place]. [facility]  Patient:    [name] [name], [name] [name] Visit Number: [id number] MRN: [id number]          Service Type: DSU Location: [facility] 2869 01 Attending Physician:  Ruta Hinds Dictated by:   Zigmund Daniel, M.D. Proc. Date: 02/13/01 Admit Date:  02/13/2001 Discharge Date: 02/13/2001   CC:         Richard A. Alanda Amass, M.D.   Consultation Report  HISTORY:  The patient is a 70 year old white male who underwent evaluation of pain in the right knee.  He is known to have some arthritis, and he saw Dr. Coral Spikes who felt that the pain was not completely explained by arthritis. The patient has been taking only Tylenol, however.  He was found to have an enlarged popliteal artery shown to be aneurysmally dilated by duplex scan, and he underwent arteriogram today showing excellent inflow with minimal proximal disease and distal disease but very irregular and enlarged popliteal artery. He has had no pain in the foot.  He has had some pain in the area of the right hip.  He has had no symptoms to speak of on the left.  The arteriogram additionally demonstrated minimal aneurysmal dilatation of the left popliteal artery.  The maximum arteriographic diameter of aneurysm was 2 cm.  The popliteal artery is diffusely aneurysmal.  There is no obvious clot free within the lumen.  On examination, the patient has no evidence of complication of the arterial puncture on the right side.  There are good posterior tibial pulses bilaterally.  There is an easily palpable popliteal aneurysm on both sides, much larger on the right, slightly tender on the right.  Palpation of aneurysm does not reproduce the patients pain, however.  There is no skin ulceration or evidence of any thromboembolic disease distally.  IMPRESSION: 1. Right popliteal artery aneurysm, possibly producing pain. 2. Left popliteal aneurysm, minimal.  RECOMMENDATIONS:  I have discussed the situation  with the patient, and I reviewed the angiogram personally.  I told him that I could not be altogether certain that this aneurysm could be producing his pain, although it might possibly be producing some pressure on nerves and tissues which would be painful.  I let him know that the main reason for repair of such an aneurysm would be to prevent rupture and thromboembolic complications of the aneurysm. He understands this.  His main concern is his pain, and he may want to discuss change of his pain medication regimen with his physician.  He also wants to discuss this with his family and will get back in touch with me after the holidays to discuss the possibility of having the aneurysm repaired.  I think the aneurysm should be repaired. Dictated by:   Zigmund Daniel, M.D. Attending Physician:  Ruta Hinds DD:  02/13/01 TD:  02/14/01 Job: [id number]

## 2010-07-13 NOTE — Op Note (Signed)
[place]. [facility]  Patient:    [name] [name], [name] [name] Visit Number: [id number] MRN: [id number]          Service Type: SUR Location: 5700 5704 01 Attending Physician:  Meredith Leeds Dictated by:   Zigmund Daniel, M.D. Proc. Date: 03/13/01 Admit Date:  03/13/2001 Discharge Date: 03/15/2001   CC:         Richard A. Alanda Amass, M.D.   Operative Report  PREOPERATIVE DIAGNOSIS:  Painful right popliteal artery aneurysm.  POSTOPERATIVE DIAGNOSIS:  Painful right popliteal artery aneurysm.  PROCEDURE:  Exclusion and bypass of right popliteal aneurysm.  SURGEON:  Zigmund Daniel, M.D.  ASSISTANT:  Donnie Coffin. Samuella Cota, M.D.  ANESTHESIA:  General.  DESCRIPTION OF PROCEDURE:  After the patient was monitored and anesthetized and had routine preparation and draping of the entire right lower extremity, right groin, and lower abdomen, I made an incision in the medial distal thigh, dissected down into the popliteal space, and carefully isolated and controlled proximally and distally the popliteal artery.  It was slightly large but not aneurysmal at that point.  It was a bit difficult to separate from the femoral veins, but we got it separated and an adequate length exposed.  I then made an incision on the proximal medial leg and dissected down through the subcutaneous tissues and muscle and entered the popliteal space and cut the semimembranosus and semitendinosus tendon and went on down into the popliteal space and again isolated the popliteal artery proximally and distally.  It was of normal caliber at that location.  There was a good pulse in it.  It was very deep and a bit difficult to expose, but I was able to get it adequately exposed.  I then located the long saphenous vein in the posteromedial thigh and found it to be of adequate caliber, and I dissected out a suitable length of the vein in the thigh, cutting down directly on it.  I made a tunnel  for the vein between the proximal and distal arterial incisions.  I ligated tributaries of the vein and then resected an adequate length, dilated it to be sure there were no holes in it, and then reversed it and then after the patient was heparinized, I performed an end of vein to side of popliteal artery anastomosis with 6-0 Prolene suture.  The vein was spatulated somewhat to provide a nice, wide anastomotic opening.  Flow through the vein was excellent.  I passed it through the tunnel and then cut it to the necessary length and made sure it was not twisted, then performed a similar distal anastomosis after spatulating the vein and making a longitudinal arteriotomy. This then when complete had excellent flow through the vein, no kinks or twists in it, and when I occluded the popliteal artery just proximal to the vein graft, the pulse in the distal popliteal artery and in the posterior tibial artery was excellent.  I then ligated the popliteal artery just proximal to the distal anastomosis and distal to the proximal anastomosis, and when I did so there was no longer a pulse in the popliteal artery aneurysm or in the artery proximal or distal to the ligatures.  I felt that this excluded the aneurysm adequately and that it would thrombose without any problem at that point.  I felt that the distal perfusion was excellent and the conduit was excellent.  Hemostasis was good.  I closed the incisions in multiple layers with 2-0 Vicryl and with staples,  and there was a good pulse in the bypass graft and posterior tibial artery at the close of the procedure. Dictated by:   Zigmund Daniel, M.D. Attending Physician:  Meredith Leeds DD:  03/13/01 TD:  03/16/01 Job: [id number]

## 2011-06-05 ENCOUNTER — Ambulatory Visit: Payer: Self-pay | Admitting: Ophthalmology

## 2011-06-18 ENCOUNTER — Ambulatory Visit: Payer: Self-pay | Admitting: Ophthalmology

## 2011-10-29 ENCOUNTER — Ambulatory Visit: Payer: Self-pay | Admitting: Ophthalmology

## 2011-12-04 ENCOUNTER — Encounter: Payer: Self-pay | Admitting: Vascular Surgery

## 2012-09-03 ENCOUNTER — Emergency Department (HOSPITAL_COMMUNITY): Payer: Medicare Other

## 2012-09-03 ENCOUNTER — Encounter (HOSPITAL_COMMUNITY)
Admission: EM | Disposition: A | Discharge status: Home / Self Care | Payer: Self-pay | Source: Home / Self Care | Attending: Emergency Medicine

## 2012-09-03 ENCOUNTER — Encounter (HOSPITAL_COMMUNITY): Payer: Self-pay | Admitting: Cardiology

## 2012-09-03 ENCOUNTER — Observation Stay (HOSPITAL_COMMUNITY)
Admission: EM | Admit: 2012-09-03 | Discharge: 2012-09-04 | Disposition: A | Discharge status: Home / Self Care | Payer: Medicare Other | Attending: Interventional Cardiology | Admitting: Interventional Cardiology

## 2012-09-03 DIAGNOSIS — F17200 Nicotine dependence, unspecified, uncomplicated: Secondary | ICD-10-CM | POA: Insufficient documentation

## 2012-09-03 DIAGNOSIS — J449 Chronic obstructive pulmonary disease, unspecified: Secondary | ICD-10-CM | POA: Insufficient documentation

## 2012-09-03 DIAGNOSIS — Z79899 Other long term (current) drug therapy: Secondary | ICD-10-CM | POA: Insufficient documentation

## 2012-09-03 DIAGNOSIS — Z91199 Patient's noncompliance with other medical treatment and regimen due to unspecified reason: Secondary | ICD-10-CM | POA: Insufficient documentation

## 2012-09-03 DIAGNOSIS — I2510 Atherosclerotic heart disease of native coronary artery without angina pectoris: Principal | ICD-10-CM | POA: Insufficient documentation

## 2012-09-03 DIAGNOSIS — I25110 Atherosclerotic heart disease of native coronary artery with unstable angina pectoris: Secondary | ICD-10-CM | POA: Diagnosis present

## 2012-09-03 DIAGNOSIS — I724 Aneurysm of artery of lower extremity: Secondary | ICD-10-CM | POA: Diagnosis present

## 2012-09-03 DIAGNOSIS — Z9119 Patient's noncompliance with other medical treatment and regimen: Secondary | ICD-10-CM | POA: Insufficient documentation

## 2012-09-03 DIAGNOSIS — I2582 Chronic total occlusion of coronary artery: Secondary | ICD-10-CM | POA: Insufficient documentation

## 2012-09-03 DIAGNOSIS — K219 Gastro-esophageal reflux disease without esophagitis: Secondary | ICD-10-CM | POA: Diagnosis present

## 2012-09-03 DIAGNOSIS — I252 Old myocardial infarction: Secondary | ICD-10-CM | POA: Insufficient documentation

## 2012-09-03 DIAGNOSIS — I200 Unstable angina: Secondary | ICD-10-CM | POA: Insufficient documentation

## 2012-09-03 DIAGNOSIS — R0602 Shortness of breath: Secondary | ICD-10-CM | POA: Insufficient documentation

## 2012-09-03 DIAGNOSIS — J4489 Other specified chronic obstructive pulmonary disease: Secondary | ICD-10-CM | POA: Insufficient documentation

## 2012-09-03 DIAGNOSIS — E785 Hyperlipidemia, unspecified: Secondary | ICD-10-CM | POA: Diagnosis present

## 2012-09-03 DIAGNOSIS — I4510 Unspecified right bundle-branch block: Secondary | ICD-10-CM | POA: Insufficient documentation

## 2012-09-03 DIAGNOSIS — R072 Precordial pain: Secondary | ICD-10-CM | POA: Insufficient documentation

## 2012-09-03 DIAGNOSIS — Z7982 Long term (current) use of aspirin: Secondary | ICD-10-CM | POA: Insufficient documentation

## 2012-09-03 DIAGNOSIS — K279 Peptic ulcer, site unspecified, unspecified as acute or chronic, without hemorrhage or perforation: Secondary | ICD-10-CM | POA: Diagnosis present

## 2012-09-03 HISTORY — DX: Gastro-esophageal reflux disease without esophagitis: K21.9

## 2012-09-03 HISTORY — DX: Acute myocardial infarction, unspecified: I21.9

## 2012-09-03 HISTORY — PX: LEFT HEART CATHETERIZATION WITH CORONARY ANGIOGRAM: SHX5451

## 2012-09-03 HISTORY — DX: Tobacco use: Z72.0

## 2012-09-03 HISTORY — DX: Unspecified right bundle-branch block: I45.10

## 2012-09-03 HISTORY — DX: Chronic obstructive pulmonary disease, unspecified: J44.9

## 2012-09-03 HISTORY — DX: Peptic ulcer, site unspecified, unspecified as acute or chronic, without hemorrhage or perforation: K27.9

## 2012-09-03 HISTORY — DX: Hyperlipidemia, unspecified: E78.5

## 2012-09-03 LAB — COMPREHENSIVE METABOLIC PANEL
ALT: 17 U/L (ref 0–53)
Alkaline Phosphatase: 71 U/L (ref 39–117)
CO2: 25 mEq/L (ref 19–32)
GFR calc Af Amer: >90 mL/min (ref >90)
GFR calc non Af Amer: 89 mL/min — ABNORMAL LOW (ref >90)
Glucose, Bld: 88 mg/dL (ref 70–99)
Potassium: 4.1 mEq/L (ref 3.5–5.1)
Sodium: 137 mEq/L (ref 135–145)

## 2012-09-03 LAB — BASIC METABOLIC PANEL
Calcium: 9.8 mg/dL (ref 8.4–10.5)
GFR calc Af Amer: >90 mL/min (ref >90)
GFR calc non Af Amer: 87 mL/min — ABNORMAL LOW (ref >90)
Glucose, Bld: 101 mg/dL — ABNORMAL HIGH (ref 70–99)
Potassium: 4.1 mEq/L (ref 3.5–5.1)
Sodium: 137 mEq/L (ref 135–145)

## 2012-09-03 LAB — CBC WITH DIFFERENTIAL/PLATELET
Lymphocytes Relative: 31 % (ref 12–46)
Lymphs Abs: 2.2 10*3/uL (ref 0.7–4.0)
Neutro Abs: 4.1 10*3/uL (ref 1.7–7.7)
Neutrophils Relative %: 56 % (ref 43–77)
Platelets: 176 10*3/uL (ref 150–400)
RBC: 4.86 MIL/uL (ref 4.22–5.81)
WBC: 7.3 10*3/uL (ref 4.0–10.5)

## 2012-09-03 LAB — CBC
Hemoglobin: 15.4 g/dL (ref 13.0–17.0)
MCH: 31.7 pg (ref 26.0–34.0)
MCHC: 35.2 g/dL (ref 30.0–36.0)
RDW: 13.5 % (ref 11.5–15.5)

## 2012-09-03 LAB — POCT I-STAT TROPONIN I: Troponin i, poc: 0 ng/mL (ref 0.00–0.08)

## 2012-09-03 LAB — APTT: aPTT: 29 seconds (ref 24–37)

## 2012-09-03 LAB — PROTIME-INR: Prothrombin Time: 13.5 seconds (ref 11.6–15.2)

## 2012-09-03 SURGERY — LEFT HEART CATHETERIZATION WITH CORONARY ANGIOGRAM
Anesthesia: LOCAL

## 2012-09-03 MED ORDER — NITROGLYCERIN IN D5W 200-5 MCG/ML-% IV SOLN: 3.0000 ug/min | INTRAVENOUS | Status: DC

## 2012-09-03 MED ORDER — ONDANSETRON HCL 4 MG/2ML IJ SOLN: 4.0000 mg | Freq: Four times a day (QID) | INTRAMUSCULAR | Status: DC | PRN

## 2012-09-03 MED ORDER — HEPARIN (PORCINE) IN NACL 100-0.45 UNIT/ML-% IJ SOLN
1200.0000 [IU]/h | INTRAMUSCULAR | Status: DC
  Administered 2012-09-03: 1200 [IU]/h via INTRAVENOUS
  Filled 2012-09-03: qty 250

## 2012-09-03 MED ORDER — METOPROLOL TARTRATE 25 MG PO TABS
25.0000 mg | ORAL_TABLET | Freq: Two times a day (BID) | ORAL | Status: DC
  Administered 2012-09-03 – 2012-09-04 (×2): 25 mg via ORAL
  Filled 2012-09-03 (×3): qty 1

## 2012-09-03 MED ORDER — BUPROPION HCL ER (XL) 300 MG PO TB24
300.0000 mg | ORAL_TABLET | Freq: Every day | ORAL | Status: DC
  Administered 2012-09-03 – 2012-09-04 (×2): 300 mg via ORAL
  Filled 2012-09-03 (×2): qty 1

## 2012-09-03 MED ORDER — ASPIRIN EC 81 MG PO TBEC
81.0000 mg | DELAYED_RELEASE_TABLET | Freq: Every day | ORAL | Status: DC
  Administered 2012-09-04: 10:00:00 81 mg via ORAL
  Filled 2012-09-03: qty 1

## 2012-09-03 MED ORDER — SODIUM CHLORIDE 0.9 % IV SOLN: 250.0000 mL | INTRAVENOUS | Status: DC | PRN

## 2012-09-03 MED ORDER — UREA 40 % EX OINT: 1.0000 "application " | TOPICAL_OINTMENT | Freq: Two times a day (BID) | CUTANEOUS | Status: DC

## 2012-09-03 MED ORDER — LIDOCAINE HCL (PF) 1 % IJ SOLN
INTRAMUSCULAR | Status: AC
  Filled 2012-09-03: qty 30

## 2012-09-03 MED ORDER — SODIUM CHLORIDE 0.9 % IV SOLN: 1.0000 mL/kg/h | INTRAVENOUS | Status: AC

## 2012-09-03 MED ORDER — ASPIRIN 81 MG PO TABS
81.0000 mg | ORAL_TABLET | Freq: Every day | ORAL | Status: DC
Start: 2012-09-03 — End: 2012-09-03

## 2012-09-03 MED ORDER — PANTOPRAZOLE SODIUM 40 MG PO TBEC
40.0000 mg | DELAYED_RELEASE_TABLET | Freq: Every day | ORAL | Status: DC
  Administered 2012-09-03 – 2012-09-04 (×2): 40 mg via ORAL
  Filled 2012-09-03 (×2): qty 1

## 2012-09-03 MED ORDER — ENOXAPARIN SODIUM 40 MG/0.4ML ~~LOC~~ SOLN
40.0000 mg | SUBCUTANEOUS | Status: DC
  Filled 2012-09-03: qty 0.4

## 2012-09-03 MED ORDER — ISOSORBIDE DINITRATE 20 MG PO TABS
20.0000 mg | ORAL_TABLET | Freq: Three times a day (TID) | ORAL | Status: DC
  Administered 2012-09-03 – 2012-09-04 (×2): 20 mg via ORAL
  Filled 2012-09-03 (×5): qty 1

## 2012-09-03 MED ORDER — HEPARIN (PORCINE) IN NACL 2-0.9 UNIT/ML-% IJ SOLN
INTRAMUSCULAR | Status: AC
  Filled 2012-09-03: qty 1000

## 2012-09-03 MED ORDER — SODIUM CHLORIDE 0.9 % IV SOLN: INTRAVENOUS | Status: DC

## 2012-09-03 MED ORDER — MIDAZOLAM HCL 2 MG/2ML IJ SOLN
INTRAMUSCULAR | Status: AC
  Filled 2012-09-03: qty 2

## 2012-09-03 MED ORDER — HYDROCODONE-ACETAMINOPHEN 5-325 MG PO TABS: 1.0000 | ORAL_TABLET | Freq: Four times a day (QID) | ORAL | Status: DC | PRN

## 2012-09-03 MED ORDER — ACETAMINOPHEN 325 MG PO TABS: 650.0000 mg | ORAL_TABLET | ORAL | Status: DC | PRN

## 2012-09-03 MED ORDER — SODIUM CHLORIDE 0.9 % IJ SOLN: 3.0000 mL | Freq: Two times a day (BID) | INTRAMUSCULAR | Status: DC

## 2012-09-03 MED ORDER — FENTANYL CITRATE 0.05 MG/ML IJ SOLN
INTRAMUSCULAR | Status: AC
  Filled 2012-09-03: qty 2

## 2012-09-03 MED ORDER — HEPARIN BOLUS VIA INFUSION
4000.0000 [IU] | Freq: Once | INTRAVENOUS | Status: AC
  Administered 2012-09-03: 4000 [IU] via INTRAVENOUS

## 2012-09-03 MED ORDER — ASPIRIN 81 MG PO CHEW
324.0000 mg | CHEWABLE_TABLET | Freq: Once | ORAL | Status: AC
  Administered 2012-09-03: 324 mg via ORAL
  Filled 2012-09-03: qty 4

## 2012-09-03 MED ORDER — NITROGLYCERIN 0.4 MG SL SUBL: 0.4000 mg | SUBLINGUAL_TABLET | SUBLINGUAL | Status: DC | PRN

## 2012-09-03 MED ORDER — ASPIRIN 81 MG PO CHEW: 324.0000 mg | CHEWABLE_TABLET | ORAL | Status: DC

## 2012-09-03 MED ORDER — ATORVASTATIN CALCIUM 40 MG PO TABS: 40.0000 mg | ORAL_TABLET | Freq: Every day | ORAL | Status: DC

## 2012-09-03 MED ORDER — SODIUM CHLORIDE 0.9 % IJ SOLN: 3.0000 mL | INTRAMUSCULAR | Status: DC | PRN

## 2012-09-03 MED ORDER — ROSUVASTATIN CALCIUM 20 MG PO TABS
20.0000 mg | ORAL_TABLET | Freq: Every day | ORAL | Status: DC
  Filled 2012-09-03 (×2): qty 1

## 2012-09-03 NOTE — ED Notes (Signed)
Pt a x 4. Waiting for Cath to call and report they are ready. Pt family at bedside.

## 2012-09-03 NOTE — Progress Notes (Signed)
ANTICOAGULATION CONSULT NOTE - Initial Consult  Pharmacy Consult for Heparin Indication: chest pain/ACS  Allergies  Allergen Reactions  . Lipitor (Atorvastatin)     Muscle weakness  . Sulfa Antibiotics   . Vytorin (Ezetimibe-Simvastatin)     Body aches    Patient Measurements: Height: 5\' 8"  (172.7 cm) Weight: 241 lb (109.317 kg) IBW/kg (Calculated) : 68.4 Heparin Dosing Weight: 92.6 kgs  Vital Signs: Temp: 98.1 F (36.7 C) (07/10 1127) Temp src: Oral (07/10 1127) BP: 138/84 mmHg (07/10 1133) Pulse Rate: 66 (07/10 1133)  Labs:  Recent Labs  09/03/12 1145  HGB 15.4  HCT 43.8  PLT 180  CREATININE 0.80    Estimated Creatinine Clearance: 100.1 ml/min (by C-G formula based on Cr of 0.8).   Medical History: Past Medical History  Diagnosis Date  . MI (myocardial infarction)     Medications:   (Not in a hospital admission)  Assessment: 72yo M admited w/ worsening chest pain and known CAD. Pharmacy consulted for heparin dosing. Patient shows no acute signs of bleeding. CBC stable.   Goal of Therapy:  Heparin level 0.3-0.7 units/ml Monitor platelets by anticoagulation protocol: Yes   Plan:  Give 4000 units bolus x 1 Start heparin infusion at 1200 units/hr Check anti-Xa level in 6 hours and daily while on heparin Continue to monitor H&H and platelets  Allena Katz, Analis Distler M 09/03/2012,1:31 PM

## 2012-09-03 NOTE — ED Notes (Signed)
Pt reports chest pain over the past couple of days. Reports that he has history of MI back the in the 90's. Reports SOB and nausea over the past and pain is worse with activity. Denies any chest pain at this time. No distress noted. Skin warm and dry.

## 2012-09-03 NOTE — H&P (Signed)
[name] [name] is a 72 y.o. male  Admit date: 09/03/2012 Referring Physician: Pete Glatter Primary Cardiologist:: HWB Leia Alf, M.D. Chief complaint / reason for admission: Unstable angina pectoris  HPI: The patient is 72 years of age and has long-standing history of coronary artery disease. He suffered to myocardial infarctions in the mid 1990s. Cardiac catheterization performed in 2009 demonstrated total occlusion of the right coronary filling off with collaterals from the left circumflex. Over the past year and progressively more severely over the past month the patient has experienced chest discomfort with minimal activity. Rest relieves the discomfort. Prolonged discomfort is relieved by supplemental nitroglycerin. He has dyspnea on exertion. He was admitted to the hospital with the diagnoses of accelerating angina.    PMH:    Past Medical History  Diagnosis Date  . MI (myocardial infarction)   . Gastroesophageal reflux   . Hyperlipidemia LDL goal < 70   . COPD (chronic obstructive pulmonary disease)   . Tobacco abuse   . Right bundle branch block     PSH:    Past Surgical History  Procedure Laterality Date  . Knee surgery      ALLERGIES:   Lipitor; Sulfa antibiotics; and Vytorin  Prior to Admit Meds:   Prescriptions prior to admission  Medication Sig Dispense Refill  . aspirin 81 MG tablet Take 81 mg by mouth daily.      Marland Kitchen buPROPion (WELLBUTRIN XL) 300 MG 24 hr tablet Take 300 mg by mouth daily.      . diclofenac sodium (VOLTAREN) 1 % GEL Apply 4 g topically every 6 (six) hours as needed (joint pain).      Marland Kitchen esomeprazole (NEXIUM) 40 MG capsule Take 40 mg by mouth daily before breakfast.      . HYDROcodone-acetaminophen (NORCO/VICODIN) 5-325 MG per tablet Take 1 tablet by mouth every 6 (six) hours as needed for pain.      . isosorbide dinitrate (ISORDIL) 20 MG tablet Take 20 mg by mouth 3 (three) times daily.      . nitroGLYCERIN (NITROSTAT) 0.4 MG SL tablet Place 0.4 mg  under the tongue every 5 (five) minutes as needed for chest pain.      . rosuvastatin (CRESTOR) 20 MG tablet Take 20 mg by mouth daily.      Marland Kitchen triamcinolone (KENALOG) 0.025 % cream Apply 1 application topically daily.      . urea (CARMOL) 40 % ointment Apply 1 application topically 2 (two) times daily.       Family HX:   History reviewed. No pertinent family history. Social HX:    History   Social History  . Marital Status: Married    Spouse Name: N/A    Number of Children: N/A  . Years of Education: N/A   Occupational History  . Not on file.   Social History Main Topics  . Smoking status: Current Every Day Smoker  . Smokeless tobacco: Not on file  . Alcohol Use: No  . Drug Use: No  . Sexually Active: Not on file   Other Topics Concern  . Not on file   Social History Narrative  . No narrative on file     ROS: He denies diabetes, claudication, stroke, bleeding, there is a prior history of peptic ulcer disease, and medication noncompliance.  Physical Exam: Blood pressure 148/75, pulse 66, temperature 98.1 F (36.7 C), temperature source Oral, resp. rate 20, height 5\' 8"  (1.727 m), weight 108.2 kg (238 lb 8.6 oz), SpO2  98.00%.   Patient is obese. Lying comfortably on the gurney in the emergency room without any distress. Skin color is without evidence of cyanosis or pallor.  HEENT exam shows pupils are equally reactive to light  Neck exam reveals no JVD or carotid bruits  Chest is clear auscultation and percussion  Cardiac exam reveals a grade 2 of 6 systolic murmur right sternal border. No diastolic murmur. S4 gallop is audible.  Abdomen is obese but nontender.  Extremities reveal no edema. Femoral pulses are 2+, radial pulses are 2+, and carotids are 2+ and symmetric.  Neurological exam is intact Labs:   Lab Results  Component Value Date   WBC 7.3 09/03/2012   HGB 14.9 09/03/2012   HCT 43.7 09/03/2012   MCV 89.9 09/03/2012   PLT 176 09/03/2012     Recent  Labs Lab 09/03/12 1428  NA 137  K 4.1  CL 101  CO2 25  BUN 16  CREATININE 0.75  CALCIUM 9.7  PROT 7.1  BILITOT 0.5  ALKPHOS 71  ALT 17  AST 20  GLUCOSE 88   Lab Results  Component Value Date   TROPONINI <0.30 09/03/2012   BNP    Component Value Date/Time   PROBNP 82.5 09/03/2012 1427     Radiology:   Clinical Data: 2-week history of chest pain. Shortness of breath.  History of nine. History of smoking.  CHEST - 2 VIEW  Comparison: 02/07/2009. CT 02/12/2009.  Findings: Cardiac silhouette is normal size and shape. Ectasia and  nonaneurysmal calcification of the thoracic aorta are seen. There  is tortuosity of the descending thoracic aorta. Mediastinal and  hilar contours appear stable. No pulmonary infiltrates or masses  were evident. No pleural abnormality is evident. There is  osteophyte formation in the spine. There is narrowing of some of  the intervertebral disc spaces.  IMPRESSION:  No acute or active cardiopulmonary or pleural abnormalities are  seen. Stable chronic findings are detailed above.   EKG:  Normal sinus rhythm with right bundle branch block. No acute change noted  ASSESSMENT:   1. Crescendo angina pectoris in a patient with known coronary disease with chronic total occlusion of the right coronary collateralized from the left system. Concern at this time in a patient who continues to smoke is that there is disease affecting the collateral source.  2. Tobacco abuse with probable COPD  3. Gastroesophageal reflux  Plan:  1. Serial markers rule out myocardial infarction  2. Serial EKGs to rule out microinfarction  3. The patient is been counseled to [facility] coronary angiography to define anatomy and help guide therapy.  4. Smoking cessation recommended  5. Start beta blocker therapy Lesleigh Noe 09/03/2012 5:36 PM

## 2012-09-03 NOTE — CV Procedure (Signed)
     Diagnostic Cardiac Catheterization Report  [name] [name]  72 y.o.  male 07-10-1940  Procedure Date: 09/03/2012 Referring Physician: Pete Glatter, MD Primary Cardiologist:: HWBSmith, III   PROCEDURE:  Left heart catheterization with selective coronary angiography, left ventriculogram.  INDICATIONS:  Crescendo angina the patient with known coronary disease consisting of total occlusion of the RCA collateralized from the left coronary system  The risks, benefits, and details of the procedure were explained to the patient.  The patient verbalized understanding and wanted to proceed.  Informed written consent was obtained.  PROCEDURE TECHNIQUE:  After Xylocaine anesthesia a 5 French sheath was placed in the right femoral artery with a single anterior needle wall stick.   Coronary angiography was done using a 5 [place] A2 MP, 5 [place] JR 4 and JL 4 diagnostic catheters.  Left ventriculography was done using a JR 4 utilizing hand injection.    CONTRAST:  Total of 80 cc.  COMPLICATIONS:  None.    HEMODYNAMICS:  Aortic pressure was 140/70 mmHg; LV pressure was 143/15 mmHg; LVEDP 18 mm mercury.  There was no gradient between the left ventricle and aorta.    ANGIOGRAPHIC DATA:   The left main coronary artery is widely patent.  The left anterior descending artery is transapical, giving origin to a large diagonal. Multiple luminal irregularities are noted throughout the proximal and mid vessel. No focal high-grade obstructions noted..  The left circumflex artery is nondominant. A large well-formed left atrial recurrent branch supplies collaterals to the distal right coronary. 2 small obtuse marginal branches arise from the circumflex and free of any significant obstruction..  The right coronary artery is totally occluded proximally.  LEFT VENTRICULOGRAM:  Left ventricular angiogram was done in the 30 RAO projection and revealed normal left ventricular wall motion and systolic function with an  estimated ejection fraction of 60 %.    IMPRESSIONS:  1. Exertional dyspnea and chest tightness possibly related to combination of COPD and coronary artery disease. No threatening anatomic substrate is identified by catheterization.  2. Totally occluded proximal right coronary with widely patent circumflex and LAD. Anatomy is generally unchanged compared to 2009 although there is more.diffuse LAD and circumflex disease along the previous study.  3. Overall normal left ventricular function   RECOMMENDATION:  Add low-dose beta blocker therapy. Evaluate for the presence and severity of COPD. No further cardiac evaluation to.

## 2012-09-03 NOTE — ED Provider Notes (Signed)
History    CSN: [id number] Arrival date & time 09/03/12  1116  First MD Initiated Contact with Patient 09/03/12 1131     Chief Complaint  Patient presents with  . Chest Pain   (Consider location/radiation/quality/duration/timing/severity/associated sxs/prior Treatment) HPI Comments: Patient presents with 2 week history of progressively worsening substernal chest pain. Is associated with shortness of breath and nausea and worse with exertion. It lasts for 10-15 minutes at a time. His daughter states he had MIs in 65s but is not any stents. A catheterization in 2009 showed patent vessels. He is pain-free at this time. His daughter states he's been using 5 nitroglycerin daily for the past 2 weeks when he usually uses it only intermittently. Denies any abdominal pain, back pain, headache or vision change. He saw his PCP today and was referred here to see cardiology.  The history is provided by the patient.   Past Medical History  Diagnosis Date  . MI (myocardial infarction)    Past Surgical History  Procedure Laterality Date  . Knee surgery     History reviewed. No pertinent family history. History  Substance Use Topics  . Smoking status: Current Every Day Smoker  . Smokeless tobacco: Not on file  . Alcohol Use: No    Review of Systems  Constitutional: Positive for activity change. Negative for fever and appetite change.  HENT: Negative for congestion and rhinorrhea.   Respiratory: Positive for chest tightness and shortness of breath. Negative for cough.   Cardiovascular: Positive for chest pain.  Gastrointestinal: Positive for nausea. Negative for abdominal pain.  Genitourinary: Negative for scrotal swelling and testicular pain.  Musculoskeletal: Negative for back pain.  Skin: Negative for rash.  Neurological: Positive for dizziness. Negative for weakness and headaches.  Hematological: Negative for adenopathy.  A complete 10 system review of systems was obtained and all  systems are negative except as noted in the HPI and PMH.    Allergies  Lipitor; Sulfa antibiotics; and Vytorin  Home Medications   Current Outpatient Rx  Name  Route  Sig  Dispense  Refill  . aspirin 81 MG tablet   Oral   Take 81 mg by mouth daily.         Marland Kitchen buPROPion (WELLBUTRIN XL) 300 MG 24 hr tablet   Oral   Take 300 mg by mouth daily.         . diclofenac sodium (VOLTAREN) 1 % GEL   Topical   Apply 4 g topically every 6 (six) hours as needed (joint pain).         Marland Kitchen esomeprazole (NEXIUM) 40 MG capsule   Oral   Take 40 mg by mouth daily before breakfast.         . HYDROcodone-acetaminophen (NORCO/VICODIN) 5-325 MG per tablet   Oral   Take 1 tablet by mouth every 6 (six) hours as needed for pain.         . isosorbide dinitrate (ISORDIL) 20 MG tablet   Oral   Take 20 mg by mouth 3 (three) times daily.         . nitroGLYCERIN (NITROSTAT) 0.4 MG SL tablet   Sublingual   Place 0.4 mg under the tongue every 5 (five) minutes as needed for chest pain.         . rosuvastatin (CRESTOR) 20 MG tablet   Oral   Take 20 mg by mouth daily.         Marland Kitchen triamcinolone (KENALOG) 0.025 % cream  Topical   Apply 1 application topically daily.         . urea (CARMOL) 40 % ointment   Topical   Apply 1 application topically 2 (two) times daily.          BP 149/78  Pulse 65  Temp(Src) 98.1 F (36.7 C) (Oral)  Resp 19  Ht 5\' 8"  (1.727 m)  Wt 241 lb (109.317 kg)  BMI 36.65 kg/m2  SpO2 96% Physical Exam  Constitutional: He is oriented to person, place, and time. He appears well-developed and well-nourished. No distress.  HENT:  Head: Normocephalic and atraumatic.  Mouth/Throat: No oropharyngeal exudate.  Eyes: Conjunctivae and EOM are normal. Pupils are equal, round, and reactive to light.  Neck: Normal range of motion. Neck supple.  Cardiovascular: Normal rate, regular rhythm, normal heart sounds and intact distal pulses.   No murmur  heard. Pulmonary/Chest: Effort normal and breath sounds normal. No respiratory distress.  Abdominal: Soft. There is no tenderness. There is no rebound and no guarding.  Musculoskeletal: Normal range of motion. He exhibits no edema and no tenderness.  Neurological: He is alert and oriented to person, place, and time. No cranial nerve deficit. He exhibits normal muscle tone. Coordination normal.  Skin: Skin is warm.    ED Course  Procedures (including critical care time) Labs Reviewed  BASIC METABOLIC PANEL - Abnormal; Notable for the following:    Glucose, Bld 101 (*)    GFR calc non Af Amer 87 (*)    All other components within normal limits  COMPREHENSIVE METABOLIC PANEL - Abnormal; Notable for the following:    GFR calc non Af Amer 89 (*)    All other components within normal limits  CBC  PRO B NATRIURETIC PEPTIDE  TROPONIN I  APTT  PROTIME-INR  CBC WITH DIFFERENTIAL  PRO B NATRIURETIC PEPTIDE  TROPONIN I  TROPONIN I  HEPARIN LEVEL (UNFRACTIONATED)  POCT I-STAT TROPONIN I   Dg Chest 2 View  09/03/2012   *RADIOLOGY REPORT*  Clinical Data: 2-week history of chest pain.  Shortness of breath. History of nine.  History of smoking.  CHEST - 2 VIEW  Comparison: 02/07/2009.  CT 02/12/2009.  Findings: Cardiac silhouette is normal size and shape. Ectasia and nonaneurysmal calcification of the thoracic aorta are seen.  There is tortuosity of the descending thoracic aorta.  Mediastinal and hilar contours appear stable.  No pulmonary infiltrates or masses were evident. No pleural abnormality is evident.  There is osteophyte formation in the spine.  There is narrowing of some of the intervertebral disc spaces.  IMPRESSION: No acute or active cardiopulmonary or pleural abnormalities are seen.  Stable chronic findings are detailed above.   Original Report Authenticated By: Onalee Hua Call   1. Unstable angina     MDM  Worsening intermittent chest pain for the past 2 weeks associated with shortness  of breath and nausea, similar to previous MI.Marland Kitchen Patient given aspirin. Chest pain-free at this time.   EKG is unchanged. First troponin is negative. Discussed with Dr. Katrinka Blazing of [facility] cardiology. Patient's story is concerning for angina he will be admitted for catheterization.    Date: 09/03/2012  Rate: 66  Rhythm: normal sinus rhythm  QRS Axis: normal  Intervals: normal  ST/T Wave abnormalities: nonspecific ST/T changes  Conduction Disutrbances:right bundle branch block  Narrative Interpretation:   Old EKG Reviewed: unchanged   CRITICAL CARE Performed by: Glynn Octave Total critical care time: 30 Critical care time was exclusive of separately billable procedures  and treating other patients. Critical care was necessary to treat or prevent imminent or life-threatening deterioration. Critical care was time spent personally by me on the following activities: development of treatment plan with patient and/or surrogate as well as nursing, discussions with consultants, evaluation of patient's response to treatment, examination of patient, obtaining history from patient or surrogate, ordering and performing treatments and interventions, ordering and review of laboratory studies, ordering and review of radiographic studies, pulse oximetry and re-evaluation of patient's condition.    Glynn Octave, MD 09/03/12 [phone]

## 2012-09-04 LAB — LIPID PANEL
Cholesterol: 100 mg/dL (ref 0–200)
LDL Cholesterol: 42 mg/dL (ref 0–99)
Total CHOL/HDL Ratio: 3.2 RATIO
Triglycerides: 136 mg/dL (ref <150)
VLDL: 27 mg/dL (ref 0–40)

## 2012-09-04 MED ORDER — METOPROLOL TARTRATE 25 MG PO TABS: 25.0000 mg | ORAL_TABLET | Freq: Two times a day (BID) | ORAL | Status: DC

## 2012-09-04 MED ORDER — ACTIVE PARTNERSHIP FOR HEALTH OF YOUR HEART BOOK
Freq: Once | Status: AC
  Administered 2012-09-04: 01:00:00
  Filled 2012-09-04: qty 1

## 2012-09-04 NOTE — Discharge Summary (Signed)
Patient ID: [name] [name] MRN: [id number] DOB/AGE: 73-May-1942 73 y.o.  Admit date: 09/03/2012 Discharge date: 09/04/2012 Patient Active Problem List   Diagnosis Date Noted  . Unstable angina pectoris 09/03/2012    Priority: High    Class: Acute  . CAD (coronary artery disease), native coronary artery 09/03/2012    Priority: Medium    Class: Chronic  . Bilateral popliteal artery aneurysm 09/03/2012    Priority: Low    Class: Chronic  . Gastroesophageal reflux disease 09/03/2012    Priority: Low    Class: Chronic  . Hyperlipidemia LDL goal < 70 09/03/2012    Priority: Low    Class: Chronic  . Right bundle branch block 09/03/2012    Priority: Low    Class: Chronic  . Peptic ulcer disease 09/03/2012    Priority: Low    Class: Chronic   Primary Discharge Diagnosis: Class III angina Secondary Discharge Diagnosis: Coronary atherosclerosis with chronic total occlusion of the right coronary  Bilateral popliteal artery aneurysms  Hyperlipidemia  Right bundle branch block  Tobacco abuse  COPD  Significant Diagnostic Studies: Coronary angiography  Consults: None  Hospital Course: The patient was sent to the emergency room by his primary physician because of accelerating angina. Historically, the patient reduced compliance with isosorbide therapy. Because of her history was concerning he underwent coronary angiography. Anatomy was unchanged from prior study 5 years ago demonstrating chronic total occlusion of the right coronary collateralized from the circumflex. Both the circumflex and left coronary contained significant plaquing but no high-grade obstruction.  Beta blocker therapy was reinstituted. Compliance with isosorbide was stressed. At discharge there was no evidence of complication. He was encouraged to discontinue tobacco use .   Discharge Exam: Blood pressure 134/70, pulse 68, temperature 97.7 F (36.5 C), temperature source Oral, resp. rate 18, height 5\' 8"   (1.727 m), weight 109 kg (240 lb 4.8 oz), SpO2 97.00%.    Right groin unremarkable  Chest is clear  No murmur or rub is heard.  Neurological exam is unremarkable.   Labs:   Lab Results  Component Value Date   WBC 7.3 09/03/2012   HGB 14.9 09/03/2012   HCT 43.7 09/03/2012   MCV 89.9 09/03/2012   PLT 176 09/03/2012    Recent Labs Lab 09/03/12 1428  NA 137  K 4.1  CL 101  CO2 25  BUN 16  CREATININE 0.75  CALCIUM 9.7  PROT 7.1  BILITOT 0.5  ALKPHOS 71  ALT 17  AST 20  GLUCOSE 88   Lab Results  Component Value Date   TROPONINI <0.30 09/04/2012    Lab Results  Component Value Date   CHOL 100 09/04/2012   Lab Results  Component Value Date   HDL 31* 09/04/2012   Lab Results  Component Value Date   LDLCALC 42 09/04/2012   Lab Results  Component Value Date   TRIG 136 09/04/2012   Lab Results  Component Value Date   CHOLHDL 3.2 09/04/2012   No results found for this basename: LDLDIRECT      Radiology: No acute abnormality EKG: Right bundle branch block  FOLLOW UP PLANS AND APPOINTMENTS    Medication List    STOP taking these medications       diclofenac sodium 1 % Gel  Commonly known as:  VOLTAREN      TAKE these medications       aspirin 81 MG tablet  Take 81 mg by mouth daily.  buPROPion 300 MG 24 hr tablet  Commonly known as:  WELLBUTRIN XL  Take 300 mg by mouth daily.     esomeprazole 40 MG capsule  Commonly known as:  NEXIUM  Take 40 mg by mouth daily before breakfast.     HYDROcodone-acetaminophen 5-325 MG per tablet  Commonly known as:  NORCO/VICODIN  Take 1 tablet by mouth every 6 (six) hours as needed for pain.     isosorbide dinitrate 20 MG tablet  Commonly known as:  ISORDIL  Take 20 mg by mouth 3 (three) times daily.     metoprolol tartrate 25 MG tablet  Commonly known as:  LOPRESSOR  Take 1 tablet (25 mg total) by mouth 2 (two) times daily.     nitroGLYCERIN 0.4 MG SL tablet  Commonly known as:  NITROSTAT  Place 0.4  mg under the tongue every 5 (five) minutes as needed for chest pain.     rosuvastatin 20 MG tablet  Commonly known as:  CRESTOR  Take 20 mg by mouth daily.     triamcinolone 0.025 % cream  Commonly known as:  KENALOG  Apply 1 application topically daily.     urea 40 % ointment  Commonly known as:  CARMOL  Apply 1 application topically 2 (two) times daily.           Follow-up Information   Follow up with Lesleigh Noe, MD On 10/05/2012. (2:45 PM)    Contact information:   [address] [place] [address] [phone]       BRING ALL MEDICATIONS WITH YOU TO FOLLOW UP APPOINTMENTS  Time spent with patient to include physician time: 20 minutes Signed: Lesleigh Noe 09/04/2012, 8:05 AM

## 2012-11-11 ENCOUNTER — Other Ambulatory Visit: Payer: Self-pay | Admitting: Geriatric Medicine

## 2012-11-11 DIAGNOSIS — R1310 Dysphagia, unspecified: Secondary | ICD-10-CM

## 2012-11-18 ENCOUNTER — Ambulatory Visit
Admission: RE | Admit: 2012-11-18 | Discharge: 2012-11-18 | Disposition: A | Discharge status: Home / Self Care | Payer: Medicare Other | Source: Ambulatory Visit | Attending: Geriatric Medicine | Admitting: Geriatric Medicine

## 2012-11-18 DIAGNOSIS — R1310 Dysphagia, unspecified: Secondary | ICD-10-CM

## 2013-10-05 ENCOUNTER — Telehealth: Payer: Self-pay

## 2013-10-05 NOTE — Telephone Encounter (Signed)
I received a call from the patient's daughter stating that her father was here and told someone that he was having Chest Pain. I called the patient on both phone numbers provided in EPIC and left a message on both for the patient to return my call. Dr. [name] was made aware of this patient and he advised to tell the patient to please go to the ER at [facility] if he was having current chest pain. An appt. Was made for the patient to see Dr. [name] in Oct. And a note was sent to Marland Mcalpine, CMA for Dr. [name] to try and move the patient to a sooner appt.

## 2013-10-06 NOTE — Telephone Encounter (Signed)
Spoke with patients daughter yesterday and let her know we will work on getting her father seen sooner. I also asked pts daughter if her father had received work-up for his COPD, which was Dr. Thompson Caul recommendation after heart cath last year. She said that he probably hasnt and she wasn't aware of him having COPD. She will get pt in to see his  primary Dr. Felipa Eth asap and I told her we would get back to her regarding appt for father.

## 2013-10-15 ENCOUNTER — Telehealth: Payer: Self-pay

## 2013-10-15 NOTE — Telephone Encounter (Signed)
pt aware appt moved up with Dr.Smith to 9/9 @ 10;15am.pt verbalized understanding.

## 2013-10-18 NOTE — Telephone Encounter (Signed)
Lamar Laundry, CMA at 10/15/2013 3:49 PM     Status: Signed        pt aware appt moved up with Dr.Smith to 9/9 @ 10;15am.pt verbalized understanding.

## 2013-10-19 ENCOUNTER — Telehealth: Payer: Self-pay | Admitting: *Deleted

## 2013-10-19 MED ORDER — METOPROLOL TARTRATE 25 MG PO TABS: 25.0000 mg | ORAL_TABLET | Freq: Two times a day (BID) | ORAL | Status: DC

## 2013-10-19 NOTE — Telephone Encounter (Signed)
Metoprolol refilled 60 R-1

## 2013-10-19 NOTE — Telephone Encounter (Signed)
Patients daughter requests metoprolol refill for patient be sent to [place] in [place]. Thanks, MI

## 2013-11-03 ENCOUNTER — Ambulatory Visit (INDEPENDENT_AMBULATORY_CARE_PROVIDER_SITE_OTHER): Payer: Medicare Other | Admitting: Interventional Cardiology

## 2013-11-03 ENCOUNTER — Encounter: Payer: Self-pay | Admitting: Interventional Cardiology

## 2013-11-03 VITALS — BP 102/80 | HR 61 | Ht 70.0 in | Wt 253.0 lb

## 2013-11-03 DIAGNOSIS — I2510 Atherosclerotic heart disease of native coronary artery without angina pectoris: Secondary | ICD-10-CM

## 2013-11-03 DIAGNOSIS — I10 Essential (primary) hypertension: Secondary | ICD-10-CM

## 2013-11-03 DIAGNOSIS — I209 Angina pectoris, unspecified: Secondary | ICD-10-CM

## 2013-11-03 DIAGNOSIS — I4510 Unspecified right bundle-branch block: Secondary | ICD-10-CM

## 2013-11-03 DIAGNOSIS — I25119 Atherosclerotic heart disease of native coronary artery with unspecified angina pectoris: Secondary | ICD-10-CM

## 2013-11-03 DIAGNOSIS — E785 Hyperlipidemia, unspecified: Secondary | ICD-10-CM

## 2013-11-03 DIAGNOSIS — I724 Aneurysm of artery of lower extremity: Secondary | ICD-10-CM

## 2013-11-03 NOTE — Patient Instructions (Signed)
Your physician recommends that you continue on your current medications as directed. Please refer to the Current Medication list given to you today.  Your physician has requested that you have a lexiscan myoview. For further information please visit [email or web address]. Please follow instruction sheet, as given.  Your physician wants you to follow-up in: 1 year You will receive a reminder letter in the mail two months in advance. If you don't receive a letter, please call our office to schedule the follow-up appointment.

## 2013-11-03 NOTE — Progress Notes (Signed)
Patient ID: [name] [name], male   DOB: Jun 19, 1940, 73 y.o.   MRN: [id number]    [address], [address], [place]  [address] Phone: [phone] Fax:  [phone]  Date:  11/03/2013   ID:  [name] [name], DOB 03-Apr-1940, MRN [id number]  PCP:  No primary provider on file.   ASSESSMENT:  1. Coronary atherosclerosis with known RCA occlusion and moderate LAD disease in 2009. Progressive angina over the last 6 months requiring multiple nitroglycerin tablets for relief 2. Hypertension 3. Hyperlipidemia 4. Right bundle branch block 5. Peripheral vascular disease with bilateral popliteal aneurysm  PLAN:  1. pharmacologic nuclear study 2. He prolonged angina unrelieved by nitroglycerin, go to the emergency room 3. Moderate to high-risk myocardial perfusion study, will need to have coronary angiography   SUBJECTIVE: [name] [name] is a 73 y.o. male who is experiencing gradual increase in angina over the past 6 months. Angina has always been exertional do to a totally occluded right coronary. He now notices that when he climbs a flight of stairs, he would get tightness in his chest and sometimes requires up to 3 nitroglycerin tablets to relieve. He has never had discomfort to occur at rest. He denies orthopnea, PND, or lower extremity swelling. He's had no prolonged episodes lasting greater than 10 minutes.   Wt Readings from Last 3 Encounters:  11/03/13 253 lb (114.76 kg)  09/04/12 240 lb 4.8 oz (109 kg)  09/04/12 240 lb 4.8 oz (109 kg)     Past Medical History  Diagnosis Date  . MI (myocardial infarction)   . Gastroesophageal reflux   . Hyperlipidemia LDL goal < 70   . COPD (chronic obstructive pulmonary disease)   . Tobacco abuse   . Right bundle branch block   . Peptic ulcer disease     Current Outpatient Prescriptions  Medication Sig Dispense Refill  . aspirin 81 MG tablet Take 81 mg by mouth daily.      Marland Kitchen buPROPion (WELLBUTRIN XL) 300 MG 24 hr tablet Take 300 mg by  mouth daily.      Marland Kitchen esomeprazole (NEXIUM) 40 MG capsule Take 40 mg by mouth daily before breakfast.      . HYDROcodone-acetaminophen (NORCO/VICODIN) 5-325 MG per tablet Take 1 tablet by mouth every 6 (six) hours as needed for pain.      . isosorbide dinitrate (ISORDIL) 20 MG tablet Take 20 mg by mouth 3 (three) times daily.      . metoprolol tartrate (LOPRESSOR) 25 MG tablet Take 1 tablet (25 mg total) by mouth 2 (two) times daily.  60 tablet  1  . nitroGLYCERIN (NITROSTAT) 0.4 MG SL tablet Place 0.4 mg under the tongue every 5 (five) minutes as needed for chest pain.      . rosuvastatin (CRESTOR) 20 MG tablet Take 10 mg by mouth daily.       . tamsulosin (FLOMAX) 0.4 MG CAPS capsule       . triamcinolone (KENALOG) 0.025 % cream Apply 1 application topically daily.      . urea (CARMOL) 40 % ointment Apply 1 application topically 2 (two) times daily.       No current facility-administered medications for this visit.    Allergies:    Allergies  Allergen Reactions  . Lipitor [Atorvastatin]     Muscle weakness  . Sulfa Antibiotics   . Vytorin [Ezetimibe-Simvastatin]     Body aches    Social History:  The patient  reports that  he has been smoking.  He does not have any smokeless tobacco history on file. He reports that he does not drink alcohol or use illicit drugs.   ROS:  Please see the history of present illness.   Denies claudication. Relatively sedentary due to low back discomfort and degenerative disc disease. Denies palpitations and syncope.   All other systems reviewed and negative.   OBJECTIVE: VS:  BP 102/80  Pulse 61  Ht 5\' 10"  (1.778 m)  Wt 253 lb (114.76 kg)  BMI 36.30 kg/m2 Well nourished, well developed, in no acute distress, obese HEENT: normal Neck: JVD flat. Carotid bruit absent  Cardiac:  normal S1, S2; RRR; no murmur Lungs:  clear to auscultation bilaterally, no wheezing, rhonchi or rales Abd: soft, nontender, no hepatomegaly Ext: Edema trace bilateral. Pulses  2+ and symmetric Skin: warm and dry Neuro:  CNs 2-12 intact, no focal abnormalities noted  EKG:  Sinus bradycardia, right bundle branch block, otherwise unchanged       Signed, Illene Labrador III, MD 11/03/2013 11:23 AM

## 2013-11-17 ENCOUNTER — Ambulatory Visit (HOSPITAL_COMMUNITY): Payer: Medicare Other | Attending: Cardiology | Admitting: Radiology

## 2013-11-17 VITALS — BP 129/76 | HR 57 | Ht 70.0 in | Wt 249.0 lb

## 2013-11-17 DIAGNOSIS — I25119 Atherosclerotic heart disease of native coronary artery with unspecified angina pectoris: Secondary | ICD-10-CM

## 2013-11-17 DIAGNOSIS — R0602 Shortness of breath: Secondary | ICD-10-CM | POA: Diagnosis not present

## 2013-11-17 DIAGNOSIS — I10 Essential (primary) hypertension: Secondary | ICD-10-CM | POA: Diagnosis not present

## 2013-11-17 DIAGNOSIS — R42 Dizziness and giddiness: Secondary | ICD-10-CM | POA: Insufficient documentation

## 2013-11-17 DIAGNOSIS — I4510 Unspecified right bundle-branch block: Secondary | ICD-10-CM | POA: Insufficient documentation

## 2013-11-17 DIAGNOSIS — R079 Chest pain, unspecified: Secondary | ICD-10-CM | POA: Insufficient documentation

## 2013-11-17 DIAGNOSIS — I2510 Atherosclerotic heart disease of native coronary artery without angina pectoris: Secondary | ICD-10-CM

## 2013-11-17 MED ORDER — REGADENOSON 0.4 MG/5ML IV SOLN
0.4000 mg | Freq: Once | INTRAVENOUS | Status: AC
  Administered 2013-11-17: 0.4 mg via INTRAVENOUS

## 2013-11-17 MED ORDER — TECHNETIUM TC 99M SESTAMIBI GENERIC - CARDIOLITE
30.0000 | Freq: Once | INTRAVENOUS | Status: AC | PRN
  Administered 2013-11-17: 30 via INTRAVENOUS

## 2013-11-17 MED ORDER — TECHNETIUM TC 99M SESTAMIBI GENERIC - CARDIOLITE
10.0000 | Freq: Once | INTRAVENOUS | Status: AC | PRN
  Administered 2013-11-17: 10 via INTRAVENOUS

## 2013-11-17 NOTE — Progress Notes (Signed)
[facility] [place], [place] [address] [phone]    Cardiology Nuclear Med Study  [name] [name] is a 73 y.o. male     MRN : [id number]     DOB: [birth year]  Procedure Date: 11/17/2013  Nuclear Med Background Indication for Stress Test:  Evaluation for Ischemia and Follow up CAD History:  CAD, MPI ~5-6 yrs ago (normal), COPD Cardiac Risk Factors: Hypertension and RBBB  Symptoms:  Chest Pain with Exertion (last date of chest discomfort was yesterday), Dizziness and SOB   Nuclear Pre-Procedure Caffeine/Decaff Intake:  None NPO After: 7:00pm   Lungs:  clear O2 Sat: 95% on room air. IV 0.9% NS with Angio Cath:  22g  IV Site: R Hand  IV Started by:  Crissie Figures, RN  Chest Size (in):  50 Cup Size: n/a  Height: 5\' 10"  (1.778 m)  Weight:  249 lb (112.946 kg)  BMI:  Body mass index is 35.73 kg/(m^2). Tech Comments:  N/A    Nuclear Med Study 1 or 2 day study: 1 day  Stress Test Type:  Lexiscan  Reading MD: N/A  Order Authorizing Provider:  [name], MD  Resting Radionuclide: Technetium 66m Sestamibi  Resting Radionuclide Dose: 11.0 mCi   Stress Radionuclide:  Technetium 81m Sestamibi  Stress Radionuclide Dose: 33.0 mCi           Stress Protocol Rest HR:57 Stress HR: 72  Rest BP: 129/76 Stress BP: 151/84  Exercise Time (min): n/a METS: n/a           Dose of Adenosine (mg):  n/a Dose of Lexiscan: 0.4 mg  Dose of Atropine (mg): n/a Dose of Dobutamine: n/a mcg/kg/min (at max HR)  Stress Test Technologist: Glade Lloyd, BS-ES  Nuclear Technologist:  Vedia Pereyra, CNMT     Rest Procedure:  Myocardial perfusion imaging was performed at rest 45 minutes following the intravenous administration of Technetium 79m Sestamibi. Rest ECG: NSR-RBBB  Stress Procedure:  The patient received IV Lexiscan 0.4 mg over 15-seconds.  Technetium 37m Sestamibi injected at 30-seconds.  Quantitative spect images were obtained after a 45 minute delay.   During the infusion of Lexiscan the patient complained of SOB and had a slight cough.  These symptoms subsided in recovery.  Stress ECG: No significant change from baseline ECG  QPS Raw Data Images:  Normal; no motion artifact; normal heart/lung ratio. Stress Images:  There is decreased uptake in the inferior wall. Rest Images:  There is decreased uptake in the inferior wall. Subtraction (SDS):   Fixed inferior wall defect Transient Ischemic Dilatation (Normal <1.22):  1.37 Lung/Heart Ratio (Normal <0.45):  0.33  Quantitative Gated Spect Images QGS EDV:  124 ml QGS ESV:  63 ml  Impression Exercise Capacity:  Lexiscan with no exercise. BP Response:  Normal blood pressure response. Clinical Symptoms:  There is dyspnea. ECG Impression:  No significant ST segment change suggestive of ischemia. Comparison with Prior Nuclear Study: No images to compare  Overall Impression:  Low risk stress nuclear study Moderate sized inferior wall infarct from apex to base with no ischemia.  LV Ejection Fraction: 49%.  LV Wall Motion:  Mildly decreased function septal hypokinesis  [name]

## 2013-11-19 ENCOUNTER — Telehealth: Payer: Self-pay

## 2013-11-19 NOTE — Telephone Encounter (Signed)
called and spioke with pt and pt wife about lexiscan results.Moderate risk study due to fixed defect and ischemic dilatation. Needscath with coronary angio and possible PCI  Per pt o/v on 9/9 Dr.Smith discussed that a cardiac cath may be required it pt lexiscan was mod-high risk. Pt and pt wife sts that they were not aware. Pt would like for Dr.Smith to give him a call before he decides to proceed Pt adv that I will fwd Dr.Smith a message to call him-adv pt it would probably be mid week next wk since Dr.Smith has jury duty Pt adv to seek medical attention for prolonged chest pain not relieved with Nitro Pt verbalized understanding.

## 2013-11-19 NOTE — Telephone Encounter (Signed)
Message copied by Lamar Laundry on Fri Nov 19, 2013  2:42 PM ------      Message from: [name]      Created: Thu Nov 18, 2013  7:02 PM       Moderate risk study due to fixed defect and ischemic dilatation. Needscath with coronary angio and possible PCI ------

## 2013-11-26 ENCOUNTER — Telehealth: Payer: Self-pay | Admitting: Interventional Cardiology

## 2013-11-26 ENCOUNTER — Other Ambulatory Visit: Payer: Self-pay | Admitting: Interventional Cardiology

## 2013-11-26 ENCOUNTER — Encounter (HOSPITAL_COMMUNITY): Payer: Self-pay | Admitting: Pharmacy Technician

## 2013-11-26 DIAGNOSIS — R931 Abnormal findings on diagnostic imaging of heart and coronary circulation: Secondary | ICD-10-CM

## 2013-11-26 DIAGNOSIS — R9439 Abnormal result of other cardiovascular function study: Secondary | ICD-10-CM

## 2013-11-26 NOTE — Telephone Encounter (Signed)
returned pt daughter call.lmtcb

## 2013-11-26 NOTE — Telephone Encounter (Signed)
New message     When is cath scheduled?

## 2013-11-26 NOTE — Telephone Encounter (Signed)
Pt daughter aware.pt cardiac cath scheduled on 10/7 @8 :30am with Dr.Smith. Pt will come into the office for pre procedure labs on 10/5 and have a cxr @ [facility]. Pre procedure letter left at the front desk for pt to pick up when he comes in for labs.pt daughter verbalized understanding

## 2013-11-29 ENCOUNTER — Other Ambulatory Visit (INDEPENDENT_AMBULATORY_CARE_PROVIDER_SITE_OTHER): Payer: Medicare Other | Admitting: *Deleted

## 2013-11-29 ENCOUNTER — Ambulatory Visit
Admission: RE | Admit: 2013-11-29 | Discharge: 2013-11-29 | Disposition: A | Discharge status: Home / Self Care | Payer: Medicare Other | Source: Ambulatory Visit | Attending: Interventional Cardiology | Admitting: Interventional Cardiology

## 2013-11-29 DIAGNOSIS — R9439 Abnormal result of other cardiovascular function study: Secondary | ICD-10-CM

## 2013-11-29 DIAGNOSIS — R931 Abnormal findings on diagnostic imaging of heart and coronary circulation: Secondary | ICD-10-CM

## 2013-11-29 LAB — PROTIME-INR
INR: 1.1 ratio — ABNORMAL HIGH (ref 0.8–1.0)
PROTHROMBIN TIME: 12.4 s (ref 9.6–13.1)

## 2013-11-29 LAB — CBC WITH DIFFERENTIAL/PLATELET
BASOS ABS: 0 10*3/uL (ref 0.0–0.1)
Basophils Relative: 0.4 % (ref 0.0–3.0)
Eosinophils Absolute: 0.3 10*3/uL (ref 0.0–0.7)
Eosinophils Relative: 3.9 % (ref 0.0–5.0)
HCT: 42.5 % (ref 39.0–52.0)
Hemoglobin: 14.1 g/dL (ref 13.0–17.0)
LYMPHS PCT: 29 % (ref 12.0–46.0)
Lymphs Abs: 2 10*3/uL (ref 0.7–4.0)
MCHC: 33.2 g/dL (ref 30.0–36.0)
MCV: 91.4 fl (ref 78.0–100.0)
MONO ABS: 0.6 10*3/uL (ref 0.1–1.0)
Monocytes Relative: 8 % (ref 3.0–12.0)
Neutro Abs: 4.2 10*3/uL (ref 1.4–7.7)
Neutrophils Relative %: 58.7 % (ref 43.0–77.0)
PLATELETS: 199 10*3/uL (ref 150.0–400.0)
RBC: 4.66 Mil/uL (ref 4.22–5.81)
RDW: 14.5 % (ref 11.5–15.5)
WBC: 7.1 10*3/uL (ref 4.0–10.5)

## 2013-11-29 LAB — BASIC METABOLIC PANEL
BUN: 16 mg/dL (ref 6–23)
CALCIUM: 9.1 mg/dL (ref 8.4–10.5)
CO2: 24 mEq/L (ref 19–32)
CREATININE: 0.8 mg/dL (ref 0.4–1.5)
Chloride: 101 mEq/L (ref 96–112)
GFR: 97.6 mL/min (ref >60.00)
Glucose, Bld: 130 mg/dL — ABNORMAL HIGH (ref 70–99)
Potassium: 4.2 mEq/L (ref 3.5–5.1)
Sodium: 135 mEq/L (ref 135–145)

## 2013-12-01 ENCOUNTER — Encounter (HOSPITAL_COMMUNITY)
Admission: RE | Disposition: A | Discharge status: Home / Self Care | Payer: Self-pay | Source: Ambulatory Visit | Attending: Interventional Cardiology

## 2013-12-01 ENCOUNTER — Ambulatory Visit (HOSPITAL_COMMUNITY)
Admission: RE | Admit: 2013-12-01 | Discharge: 2013-12-01 | Disposition: A | Discharge status: Home / Self Care | Payer: Medicare Other | Source: Ambulatory Visit | Attending: Interventional Cardiology | Admitting: Interventional Cardiology

## 2013-12-01 DIAGNOSIS — Z720 Tobacco use: Secondary | ICD-10-CM | POA: Diagnosis not present

## 2013-12-01 DIAGNOSIS — I2582 Chronic total occlusion of coronary artery: Secondary | ICD-10-CM | POA: Diagnosis not present

## 2013-12-01 DIAGNOSIS — E785 Hyperlipidemia, unspecified: Secondary | ICD-10-CM | POA: Insufficient documentation

## 2013-12-01 DIAGNOSIS — J449 Chronic obstructive pulmonary disease, unspecified: Secondary | ICD-10-CM | POA: Diagnosis not present

## 2013-12-01 DIAGNOSIS — I252 Old myocardial infarction: Secondary | ICD-10-CM | POA: Diagnosis not present

## 2013-12-01 DIAGNOSIS — I25119 Atherosclerotic heart disease of native coronary artery with unspecified angina pectoris: Secondary | ICD-10-CM | POA: Diagnosis not present

## 2013-12-01 DIAGNOSIS — I4510 Unspecified right bundle-branch block: Secondary | ICD-10-CM | POA: Diagnosis not present

## 2013-12-01 DIAGNOSIS — R931 Abnormal findings on diagnostic imaging of heart and coronary circulation: Secondary | ICD-10-CM

## 2013-12-01 DIAGNOSIS — I10 Essential (primary) hypertension: Secondary | ICD-10-CM | POA: Insufficient documentation

## 2013-12-01 DIAGNOSIS — I739 Peripheral vascular disease, unspecified: Secondary | ICD-10-CM | POA: Diagnosis not present

## 2013-12-01 DIAGNOSIS — I25118 Atherosclerotic heart disease of native coronary artery with other forms of angina pectoris: Secondary | ICD-10-CM

## 2013-12-01 DIAGNOSIS — R9439 Abnormal result of other cardiovascular function study: Secondary | ICD-10-CM | POA: Diagnosis present

## 2013-12-01 HISTORY — PX: LEFT HEART CATHETERIZATION WITH CORONARY ANGIOGRAM: SHX5451

## 2013-12-01 SURGERY — LEFT HEART CATHETERIZATION WITH CORONARY ANGIOGRAM
Anesthesia: LOCAL

## 2013-12-01 MED ORDER — HEPARIN (PORCINE) IN NACL 2-0.9 UNIT/ML-% IJ SOLN
INTRAMUSCULAR | Status: AC
  Filled 2013-12-01: qty 1000

## 2013-12-01 MED ORDER — ONDANSETRON HCL 4 MG/2ML IJ SOLN: 4.0000 mg | Freq: Four times a day (QID) | INTRAMUSCULAR | Status: DC | PRN

## 2013-12-01 MED ORDER — FENTANYL CITRATE 0.05 MG/ML IJ SOLN
INTRAMUSCULAR | Status: AC
  Filled 2013-12-01: qty 2

## 2013-12-01 MED ORDER — NITROGLYCERIN 1 MG/10 ML FOR IR/CATH LAB
INTRA_ARTERIAL | Status: AC
  Filled 2013-12-01: qty 10

## 2013-12-01 MED ORDER — ASPIRIN 81 MG PO CHEW
CHEWABLE_TABLET | ORAL | Status: AC
  Filled 2013-12-01: qty 1

## 2013-12-01 MED ORDER — SODIUM CHLORIDE 0.9 % IV SOLN
INTRAVENOUS | Status: DC
  Administered 2013-12-01: 07:00:00 via INTRAVENOUS

## 2013-12-01 MED ORDER — VERAPAMIL HCL 2.5 MG/ML IV SOLN
INTRAVENOUS | Status: AC
  Filled 2013-12-01: qty 2

## 2013-12-01 MED ORDER — LIDOCAINE HCL (PF) 1 % IJ SOLN
INTRAMUSCULAR | Status: AC
  Filled 2013-12-01: qty 30

## 2013-12-01 MED ORDER — HEPARIN SODIUM (PORCINE) 1000 UNIT/ML IJ SOLN
INTRAMUSCULAR | Status: AC
  Filled 2013-12-01: qty 1

## 2013-12-01 MED ORDER — SODIUM CHLORIDE 0.9 % IJ SOLN: 3.0000 mL | INTRAMUSCULAR | Status: DC | PRN

## 2013-12-01 MED ORDER — ACETAMINOPHEN 325 MG PO TABS: 650.0000 mg | ORAL_TABLET | ORAL | Status: DC | PRN

## 2013-12-01 MED ORDER — SODIUM CHLORIDE 0.9 % IJ SOLN: 3.0000 mL | Freq: Two times a day (BID) | INTRAMUSCULAR | Status: DC

## 2013-12-01 MED ORDER — MIDAZOLAM HCL 2 MG/2ML IJ SOLN
INTRAMUSCULAR | Status: AC
  Filled 2013-12-01: qty 2

## 2013-12-01 MED ORDER — ASPIRIN 81 MG PO CHEW
81.0000 mg | CHEWABLE_TABLET | ORAL | Status: AC
  Administered 2013-12-01: 81 mg via ORAL

## 2013-12-01 MED ORDER — SODIUM CHLORIDE 0.9 % IV SOLN: 250.0000 mL | INTRAVENOUS | Status: DC | PRN

## 2013-12-01 MED ORDER — SODIUM CHLORIDE 0.9 % IV SOLN: 1.0000 mL/kg/h | INTRAVENOUS | Status: DC

## 2013-12-01 NOTE — Discharge Instructions (Signed)
Radial Site Care °Refer to this sheet in the next few weeks. These instructions provide you with information on caring for yourself after your procedure. Your caregiver may also give you more specific instructions. Your treatment has been planned according to current medical practices, but problems sometimes occur. Call your caregiver if you have any problems or questions after your procedure. °HOME CARE INSTRUCTIONS °· You may shower the day after the procedure. Remove the bandage (dressing) and gently wash the site with plain soap and water. Gently pat the site dry. °· Do not apply powder or lotion to the site. °· Do not submerge the affected site in water for 3 to 5 days. °· Inspect the site at least twice daily. °· Do not flex or bend the affected arm for 24 hours. °· No lifting over 5 pounds (2.3 kg) for 5 days after your procedure. °· Do not drive home if you are discharged the same day of the procedure. Have someone else drive you. °· You may drive 24 hours after the procedure unless otherwise instructed by your caregiver. °· Do not operate machinery or power tools for 24 hours. °· A responsible adult should be with you for the first 24 hours after you arrive home. °What to expect: °· Any bruising will usually fade within 1 to 2 weeks. °· Blood that collects in the tissue (hematoma) may be painful to the touch. It should usually decrease in size and tenderness within 1 to 2 weeks. °SEEK IMMEDIATE MEDICAL CARE IF: °· You have unusual pain at the radial site. °· You have redness, warmth, swelling, or pain at the radial site. °· You have drainage (other than a small amount of blood on the dressing). °· You have chills. °· You have a fever or persistent symptoms for more than 72 hours. °· You have a fever and your symptoms suddenly get worse. °· Your arm becomes pale, cool, tingly, or numb. °· You have heavy bleeding from the site. Hold pressure on the site. °Document Released: 03/16/2010 Document Revised:  05/06/2011 Document Reviewed: 03/16/2010 °ExitCare® Patient Information ©2015 [employer]. This information is not intended to replace advice given to you by your health care provider. Make sure you discuss any questions you have with your health care provider. ° °

## 2013-12-01 NOTE — H&P (Signed)
[facility] [address], [address] [place], [place]  [address] Phone: [phone] Fax:  [phone]  Date:  12/01/2013   ID:  [name], DOB 05-16-1940, MRN [id number]  PCP:  No primary provider on file.   ASSESSMENT:  1. Coronary atherosclerotic heart disease with recent increase in angina in a patient with known total occlusion of the RCA and moderate LAD disease. Recent nuclear study demonstrated a large generally fixed inferior defect with evidence of stress induced LV enlargement. This is a moderate risk study and in light of recent symptoms, coronary angiography is indicated. 2. Essential hypertension 3. Hyperlipidemia 4. Peripheral vascular disease with bilateral popliteal aneurysm formation  PLAN:  1. Discuss the findings of the nuclear study with the patient. In view of accelerating anginal symptoms, I have recommended coronary angiography to rule out progression of disease in the setting of known total occlusion of the right coronary. 2. The procedure and risks were discussed with the patient in detail including the risk of stroke, death, myocardial infarction, kidney injury, limb ischemia, among others.   SUBJECTIVE: [name] [name] is a 73 y.o. male a several week history of recurring chest discomfort responsive to nitroglycerin. A recent myocardial perfusion study was read as "low risk". Upon review of the study there is a large inferior wall defect, mild reduction in LVEF, and ischemic dilatation. Because of these findings which to me suggests at least moderate risk and his anginal symptoms, coronary angiography is indicated to exclude progression of disease in territories of an RCA which is known to be occluded. Patient denies orthopnea, PND, and syncope.   Wt Readings from Last 3 Encounters:  12/01/13 250 lb (113.399 kg)  12/01/13 250 lb (113.399 kg)  11/17/13 249 lb (112.946 kg)     Past Medical History  Diagnosis Date  . MI (myocardial infarction)   .  Gastroesophageal reflux   . Hyperlipidemia LDL goal < 70   . COPD (chronic obstructive pulmonary disease)   . Tobacco abuse   . Right bundle branch block   . Peptic ulcer disease     Current Facility-Administered Medications  Medication Dose Route Frequency Provider Last Rate Last Dose  . 0.9 %  sodium chloride infusion  250 mL Intravenous PRN Belva Crome III, MD      . Derrill Memo ON 12/02/2013] 0.9 %  sodium chloride infusion   Intravenous Continuous Sinclair Grooms, MD 125 mL/hr at 12/01/13 [phone]    . aspirin 81 MG chewable tablet           . sodium chloride 0.9 % injection 3 mL  3 mL Intravenous Q12H Belva Crome III, MD      . sodium chloride 0.9 % injection 3 mL  3 mL Intravenous PRN Sinclair Grooms, MD        Allergies:    Allergies  Allergen Reactions  . Lipitor [Atorvastatin]     Muscle weakness  . Sulfa Antibiotics   . Vytorin [Ezetimibe-Simvastatin]     Body aches    Social History:  The patient  reports that he has been smoking.  He does not have any smokeless tobacco history on file. He reports that he does not drink alcohol or use illicit drugs.   ROS:  Please see the history of present illness.   Denies tobacco use.   All other systems reviewed and negative.   OBJECTIVE: VS:  BP 140/53  Pulse 64  Temp(Src) 97.7 F (36.5  C) (Oral)  Resp 20  Ht 5' 8.5" (1.74 m)  Wt 250 lb (113.399 kg)  BMI 37.46 kg/m2  SpO2 99% Well nourished, well developed, in no acute distress, obese HEENT: normal Neck: JVD flat. Carotid bruit absent  Cardiac:  normal S1, S2; RRR; no murmur Lungs:  clear to auscultation bilaterally, no wheezing, rhonchi or rales Abd: soft, nontender, no hepatomegaly Ext: Edema absent. Pulses 2+ Skin: warm and dry Neuro:  CNs 2-12 intact, no focal abnormalities noted  EKG:  Normal sinus rhythm, right bundle branch block, nonspecific ST abnormality.       Signed, Illene Labrador III, MD 12/01/2013 7:47 AM

## 2013-12-01 NOTE — CV Procedure (Addendum)
     Left Heart Catheterization with Coronary Angiography  Report  [name] [name]  73 y.o.  male [birth year]  Procedure Date: 12/01/2013 Referring Physician: [name], M.D. Primary Cardiologist: Amalia Hailey, M.D.  INDICATIONS: Class III angina with known total occlusion of the right coronary artery  PROCEDURE: 1. Left heart catheterization; 2. Coronary angiography; 3. Left ventriculography  CONSENT:  The risks, benefits, and details of the procedure were explained in detail to the patient. Risks including death, stroke, heart attack, kidney injury, allergy, limb ischemia, bleeding and radiation injury were discussed.  The patient verbalized understanding and wanted to proceed.  Informed written consent was obtained.  PROCEDURE TECHNIQUE:  After Xylocaine anesthesia a 5 French Slender sheath was placed in the right radial artery with an angiocath and the modified Seldinger technique.  Coronary angiography was done using a 5 F JR 4 and JL 3.5 cm diagnostic catheter.  Left ventriculography was done using the JR 4 catheter and hand injection.   Images were reviewed and the case was terminated.  Hemostasis was achieved with a wrist and at 13 cc of air.   CONTRAST:  Total of 90 cc.  COMPLICATIONS:  None   HEMODYNAMICS:  Aortic pressure 122/62 mmHg; LV pressure 127/18 mmHg; LVEDP 28 mmHg  ANGIOGRAPHIC DATA:   The left main coronary artery is widely patent.  The left anterior descending artery is calcified with luminal irregularities but no high-grade obstruction. The mid LAD beyond the second diagonal is 30-50% narrowed..  The left circumflex artery is moderate in size and forms a very large collateral to the distal RCA with a left atrial recurrent. No significant obstructions noted..  The right coronary artery is heavily calcified in the midsegment and totally occluded in the midsegment. Faint right to right homocollaterals are noted.Marland Kitchen   LEFT VENTRICULOGRAM:  Left ventricular  angiogram was done in the 30 RAO projection and revealed normal cavity size and contractility. EF 60%.   IMPRESSIONS: 1. Chronic total occlusion of the RCA which is well collateralized from the left circumflex and the LAD cell perforators. 2. Widely patent RCA and circumflex coronary arteries 3. Diastolic heart failure with LVEDP 28 mmHg 4. Class III angina pectoris   RECOMMENDATION:  Intensify medical therapy. Decrease LVEDP to hopefully improve angina via the Law of [place]. We'll double beta blocker to metoprolol 50 mg twice a day; change to isosorbide mononitrate 60 mg per day; add low-dose diuretic therapy.  Will refer to the CT oh team, [name].

## 2013-12-01 NOTE — Interval H&P Note (Signed)
Cath Lab Visit (complete for each Cath Lab visit)  Clinical Evaluation Leading to the Procedure:   ACS: No.  Non-ACS:    Anginal Classification: CCS III  Anti-ischemic medical therapy: Maximal Therapy (2 or more classes of medications)  Non-Invasive Test Results: Intermediate-risk stress test findings: cardiac mortality 1-3%/year  Prior CABG: No previous CABG      History and Physical Interval Note:  12/01/2013 8:17 AM  [name] [name]  has presented today for surgery, with the diagnosis of abnormal nuclear stress test, angina  The various methods of treatment have been discussed with the patient and family. After consideration of risks, benefits and other options for treatment, the patient has consented to  Procedure(s): LEFT HEART CATHETERIZATION WITH CORONARY ANGIOGRAM (N/A) as a surgical intervention .  The patient's history has been reviewed, patient examined, no change in status, stable for surgery.  I have reviewed the patient's chart and labs.  Questions were answered to the patient's satisfaction.     Sinclair Grooms

## 2013-12-02 ENCOUNTER — Ambulatory Visit: Payer: Medicare Other | Admitting: Interventional Cardiology

## 2013-12-31 ENCOUNTER — Encounter: Payer: Self-pay | Admitting: *Deleted

## 2014-01-11 ENCOUNTER — Telehealth: Payer: Self-pay

## 2014-01-11 ENCOUNTER — Telehealth: Payer: Self-pay | Admitting: Interventional Cardiology

## 2014-01-11 NOTE — Telephone Encounter (Signed)
Pt daughter Domingo Dimes that pt had a cardiac cath on 10/7.Dr.Smith was going to talk with Dr.Varanassi about proceeding with another procedure. She reports that pt chest pain has gotten worse and it is impeding on his quality of life.adv her that Dr.Smith is currently out of the office, I will fwd him a message and call back with his response. Pt daughter verbalized understanding.

## 2014-01-11 NOTE — Telephone Encounter (Signed)
Pt daughter spoke with Dr.Smith directly.. Pt will need a consult with Dr.Varanassi for GTO.pt daughter aware of appt 11/25 @8 :81EX

## 2014-01-11 NOTE — Telephone Encounter (Signed)
Returned pt daughter call.lmtcb

## 2014-01-11 NOTE — Telephone Encounter (Signed)
Pt's dtr calling re procedure pt to have, chest pain getting worse , can he get this done sooner? pls call

## 2014-01-19 ENCOUNTER — Encounter: Payer: Self-pay | Admitting: Interventional Cardiology

## 2014-01-19 ENCOUNTER — Ambulatory Visit (INDEPENDENT_AMBULATORY_CARE_PROVIDER_SITE_OTHER): Payer: Medicare Other | Admitting: Interventional Cardiology

## 2014-01-19 ENCOUNTER — Encounter: Payer: Self-pay | Admitting: *Deleted

## 2014-01-19 VITALS — BP 118/58 | HR 60 | Ht 68.5 in | Wt 253.4 lb

## 2014-01-19 DIAGNOSIS — I209 Angina pectoris, unspecified: Secondary | ICD-10-CM

## 2014-01-19 DIAGNOSIS — Z01812 Encounter for preprocedural laboratory examination: Secondary | ICD-10-CM

## 2014-01-19 DIAGNOSIS — I25119 Atherosclerotic heart disease of native coronary artery with unspecified angina pectoris: Secondary | ICD-10-CM

## 2014-01-19 DIAGNOSIS — E663 Overweight: Secondary | ICD-10-CM

## 2014-01-19 MED ORDER — PANTOPRAZOLE SODIUM 40 MG PO TBEC: 40.0000 mg | DELAYED_RELEASE_TABLET | Freq: Every day | ORAL | Status: DC

## 2014-01-19 MED ORDER — CLOPIDOGREL BISULFATE 75 MG PO TABS: 75.0000 mg | ORAL_TABLET | Freq: Every day | ORAL | Status: DC

## 2014-01-19 MED ORDER — ISOSORBIDE MONONITRATE ER 60 MG PO TB24: 60.0000 mg | ORAL_TABLET | Freq: Every day | ORAL | Status: DC

## 2014-01-19 NOTE — Progress Notes (Signed)
Patient ID: [name] [name], male   DOB: [birth year], 73 y.o.   MRN: [id number]    [place], [place],   [address] Phone: [phone] Fax:  [phone]  Date:  01/19/2014   ID:  [name], [name] February 13, 1941, MRN [id number]  PCP:  Mathews Argyle, MD      History of Present Illness: [name] [name] is a 73 y.o. male who had a cath a month ago showing a chronic total occlusion of his RCA.  He continues to have discomfort in his chest.  It comes on with walking.  He uses NTG several times a day.  Sometimes, he will have sharp pains in the center of his chest while watching TV.    He feels that sx are getting worse.  They are very limiting.  Dr. Melvern Banker did his prior cath many years ago.  Apparently, his RCA was occluded in 1993. He had a repeat catheterization in 2009 which showed a short RCA occlusion with left to right collaterals. He has never had attempted intervention on this vessel.   Wt Readings from Last 3 Encounters:  01/19/14 253 lb 6.4 oz (114.941 kg)  12/01/13 250 lb (113.399 kg)  11/17/13 249 lb (112.946 kg)     Past Medical History  Diagnosis Date  . MI (myocardial infarction)   . Gastroesophageal reflux   . Hyperlipidemia LDL goal < 70   . COPD (chronic obstructive pulmonary disease)   . Tobacco abuse   . Right bundle branch block   . Peptic ulcer disease   . Coronary artery disease 2009  . Chronic low back pain   . B12 deficiency   . Sleep apnea   . Vertebral compression fracture 1970  . Depression   . BPH (benign prostatic hyperplasia)   . Hyperglycemia   . Gynecomastia 06/2009  . Adenomatous polyp 2007    Current Outpatient Prescriptions  Medication Sig Dispense Refill  . aspirin 81 MG tablet Take 81 mg by mouth daily.    Marland Kitchen buPROPion (WELLBUTRIN XL) 300 MG 24 hr tablet Take 300 mg by mouth daily.    . cyanocobalamin (,VITAMIN B-12,) 1000 MCG/ML injection Inject 1,000 mcg into the muscle every 30 (thirty) days.    .  hydrochlorothiazide (MICROZIDE) 12.5 MG capsule   1  . HYDROcodone-acetaminophen (NORCO/VICODIN) 5-325 MG per tablet Take 1 tablet by mouth every 6 (six) hours as needed for severe pain.     . metFORMIN (GLUCOPHAGE-XR) 500 MG 24 hr tablet   1  . metoprolol tartrate (LOPRESSOR) 25 MG tablet Take 1 tablet (25 mg total) by mouth 2 (two) times daily. 60 tablet 1  . nitroGLYCERIN (NITROSTAT) 0.4 MG SL tablet Place 0.4 mg under the tongue every 5 (five) minutes as needed for chest pain.    . pantoprazole (PROTONIX) 40 MG tablet Take 1 tablet (40 mg total) by mouth daily. 90 tablet 1  . rosuvastatin (CRESTOR) 10 MG tablet Take 10 mg by mouth daily.    . tamsulosin (FLOMAX) 0.4 MG CAPS capsule Take 0.4 mg by mouth daily.     Marland Kitchen triamcinolone (KENALOG) 0.025 % cream Apply 1 application topically daily.    . urea (CARMOL) 40 % ointment Apply 1 application topically 2 (two) times daily.    . clopidogrel (PLAVIX) 75 MG tablet Take 1 tablet (75 mg total) by mouth daily. 30 tablet 3  . isosorbide mononitrate (IMDUR) 60 MG 24 hr tablet Take 1 tablet (60 mg total)  by mouth daily. 30 tablet 3   No current facility-administered medications for this visit.    Allergies:    Allergies  Allergen Reactions  . Lipitor [Atorvastatin]     Muscle weakness  . Sulfa Antibiotics   . Vytorin [Ezetimibe-Simvastatin]     Body aches    Social History:  The patient  reports that he quit smoking about 16 months ago. He does not have any smokeless tobacco history on file. He reports that he does not drink alcohol or use illicit drugs.   Family History:  The patient's family history includes Alzheimer's disease (age of onset: 77) in his mother; Diabetes in his daughter; Diabetes (age of onset: 58) in his father; Fibromyalgia (age of onset: 74) in his sister; Hypertension in his son; Obesity in his daughter; Peripheral vascular disease in his father; Pneumonia in his father.   ROS:  Please see the history of present illness.   No nausea, vomiting.  No fevers, chills.  No focal weakness.  No dysuria.    All other systems reviewed and negative.   PHYSICAL EXAM: VS:  BP 118/58 mmHg  Pulse 60  Ht 5' 8.5" (1.74 m)  Wt 253 lb 6.4 oz (114.941 kg)  BMI 37.96 kg/m2 General: Well developed, well nourished, in no acute distress HEENT: normal Neck: no JVD, no carotid bruits Cardiac:  normal S1, S2; RRR;  Lungs:  clear to auscultation bilaterally, no wheezing, rhonchi or rales Abd: soft, nontender, no hepatomegaly Ext: no edema,  Skin: warm and dry;  Neuro:   no focal abnormalities noted Psych: normal affect     ASSESSMENT AND PLAN:  1. CAD: Chronic total occlusion of the RCA.  He continues to have symptoms despite 2 antianginal medicines. Will change his long release nitrates to once a day Imdur.  His beta blocker cannot be titrated any further as he is having significant lightheadedness when he stands quickly. This started after starting on a beta blocker for antianginal. Also, his heart rate would limit this. The risks and benefits of CTO PCI were explained to the patient including renal dysfunction, radiation exposure, vessel dissection and perforation, pericardial tamponade on and death. He is willing to proceed. He understands that this may take 2 attempts. We also discussed the possibility of using rotational atherectomy, which is highly likely given the amount of calcium in this vessel. All questions were answered.  Will add clopidogrel 75 mg daily as well.  Change PPI to Protonix.  25 minutes was spent face to face with the patient counseling regarding this procedure.  2. Obesity This will be an issue for radiation exposure during the procedure as he will need high doses of radiation for imaging.  Explained that we may have to stop the procedure due to reaching a radiation exposure limit.   Signed, Mina Marble, MD, [facility] 01/19/2014 10:16 AM

## 2014-01-19 NOTE — Patient Instructions (Addendum)
Your physician has recommended you make the following change in your medication:  1) STOP Isosorbide dinitrate 2) START Imdur 60 mg daily 3) START Plavix 75 mg daily 4) STOP Prilosec 5) START Protonix  Your physician recommends that you return for pre-procedure lab work on: 01/26/14  Your physician has requested that you have a cardiac CTO PCI (chronic total occlusion percutaneous cardiac intervention) . For further information please visit [email or web address]. Please follow instruction sheet, as given.

## 2014-01-26 ENCOUNTER — Other Ambulatory Visit (INDEPENDENT_AMBULATORY_CARE_PROVIDER_SITE_OTHER): Payer: Medicare Other | Admitting: *Deleted

## 2014-01-26 DIAGNOSIS — I25119 Atherosclerotic heart disease of native coronary artery with unspecified angina pectoris: Secondary | ICD-10-CM

## 2014-01-26 DIAGNOSIS — I209 Angina pectoris, unspecified: Secondary | ICD-10-CM

## 2014-01-26 DIAGNOSIS — Z01812 Encounter for preprocedural laboratory examination: Secondary | ICD-10-CM

## 2014-01-27 LAB — BASIC METABOLIC PANEL
BUN: 14 mg/dL (ref 6–23)
CO2: 27 mEq/L (ref 19–32)
CREATININE: 0.8 mg/dL (ref 0.4–1.5)
Calcium: 8.6 mg/dL (ref 8.4–10.5)
Chloride: 106 mEq/L (ref 96–112)
GFR: 96.2 mL/min (ref >60.00)
Glucose, Bld: 112 mg/dL — ABNORMAL HIGH (ref 70–99)
Potassium: 3.9 mEq/L (ref 3.5–5.1)
Sodium: 138 mEq/L (ref 135–145)

## 2014-02-01 ENCOUNTER — Other Ambulatory Visit: Payer: Self-pay | Admitting: Interventional Cardiology

## 2014-02-01 DIAGNOSIS — I209 Angina pectoris, unspecified: Secondary | ICD-10-CM

## 2014-02-02 ENCOUNTER — Encounter (HOSPITAL_COMMUNITY)
Admission: RE | Disposition: A | Discharge status: Home / Self Care | Payer: Self-pay | Source: Ambulatory Visit | Attending: Interventional Cardiology

## 2014-02-02 ENCOUNTER — Ambulatory Visit (HOSPITAL_COMMUNITY)
Admission: RE | Admit: 2014-02-02 | Discharge: 2014-02-03 | Disposition: A | Discharge status: Home / Self Care | Payer: Medicare Other | Source: Ambulatory Visit | Attending: Interventional Cardiology | Admitting: Interventional Cardiology

## 2014-02-02 ENCOUNTER — Encounter (HOSPITAL_COMMUNITY): Payer: Self-pay | Admitting: General Practice

## 2014-02-02 DIAGNOSIS — N400 Enlarged prostate without lower urinary tract symptoms: Secondary | ICD-10-CM | POA: Diagnosis not present

## 2014-02-02 DIAGNOSIS — J449 Chronic obstructive pulmonary disease, unspecified: Secondary | ICD-10-CM | POA: Insufficient documentation

## 2014-02-02 DIAGNOSIS — F329 Major depressive disorder, single episode, unspecified: Secondary | ICD-10-CM | POA: Diagnosis not present

## 2014-02-02 DIAGNOSIS — Z7982 Long term (current) use of aspirin: Secondary | ICD-10-CM | POA: Insufficient documentation

## 2014-02-02 DIAGNOSIS — I252 Old myocardial infarction: Secondary | ICD-10-CM | POA: Diagnosis not present

## 2014-02-02 DIAGNOSIS — Z6837 Body mass index (BMI) 37.0-37.9, adult: Secondary | ICD-10-CM | POA: Insufficient documentation

## 2014-02-02 DIAGNOSIS — E119 Type 2 diabetes mellitus without complications: Secondary | ICD-10-CM | POA: Insufficient documentation

## 2014-02-02 DIAGNOSIS — I25119 Atherosclerotic heart disease of native coronary artery with unspecified angina pectoris: Secondary | ICD-10-CM | POA: Diagnosis not present

## 2014-02-02 DIAGNOSIS — I2582 Chronic total occlusion of coronary artery: Secondary | ICD-10-CM

## 2014-02-02 DIAGNOSIS — K219 Gastro-esophageal reflux disease without esophagitis: Secondary | ICD-10-CM | POA: Diagnosis not present

## 2014-02-02 DIAGNOSIS — E785 Hyperlipidemia, unspecified: Secondary | ICD-10-CM | POA: Diagnosis not present

## 2014-02-02 DIAGNOSIS — E669 Obesity, unspecified: Secondary | ICD-10-CM | POA: Diagnosis not present

## 2014-02-02 DIAGNOSIS — Z8711 Personal history of peptic ulcer disease: Secondary | ICD-10-CM | POA: Diagnosis not present

## 2014-02-02 DIAGNOSIS — Z87891 Personal history of nicotine dependence: Secondary | ICD-10-CM | POA: Insufficient documentation

## 2014-02-02 DIAGNOSIS — I209 Angina pectoris, unspecified: Secondary | ICD-10-CM

## 2014-02-02 HISTORY — DX: Obstructive sleep apnea (adult) (pediatric): G47.33

## 2014-02-02 HISTORY — DX: Unspecified osteoarthritis, unspecified site: M19.90

## 2014-02-02 HISTORY — DX: Dependence on other enabling machines and devices: Z99.89

## 2014-02-02 HISTORY — DX: Other intervertebral disc degeneration, lumbosacral region: M51.37

## 2014-02-02 HISTORY — DX: Dorsalgia, unspecified: M54.9

## 2014-02-02 HISTORY — PX: CARDIAC CATHETERIZATION: SHX172

## 2014-02-02 HISTORY — DX: Other intervertebral disc degeneration, thoracic region: M51.34

## 2014-02-02 HISTORY — DX: Other chronic pain: G89.29

## 2014-02-02 HISTORY — DX: Other cervical disc degeneration, unspecified cervical region: M50.30

## 2014-02-02 HISTORY — DX: Other intervertebral disc degeneration, lumbosacral region without mention of lumbar back pain or lower extremity pain: M51.379

## 2014-02-02 LAB — CBC
HCT: 41.4 % (ref 39.0–52.0)
Hemoglobin: 14 g/dL (ref 13.0–17.0)
MCH: 31.3 pg (ref 26.0–34.0)
MCHC: 33.8 g/dL (ref 30.0–36.0)
MCV: 92.4 fL (ref 78.0–100.0)
Platelets: 177 10*3/uL (ref 150–400)
RBC: 4.48 MIL/uL (ref 4.22–5.81)
RDW: 13.5 % (ref 11.5–15.5)
WBC: 5.4 10*3/uL (ref 4.0–10.5)

## 2014-02-02 LAB — POCT ACTIVATED CLOTTING TIME
ACTIVATED CLOTTING TIME: 405 s
ACTIVATED CLOTTING TIME: 232 s
Activated Clotting Time: 232 seconds
Activated Clotting Time: 294 seconds
Activated Clotting Time: 374 seconds
Activated Clotting Time: 325 seconds
Activated Clotting Time: 331 seconds
Activated Clotting Time: 361 seconds
Activated Clotting Time: 276 seconds
Activated Clotting Time: 140 seconds

## 2014-02-02 LAB — PROTIME-INR
INR: 2.3 — AB (ref 0.00–1.49)
INR: 1.05 (ref 0.00–1.49)
Prothrombin Time: 25.5 seconds — ABNORMAL HIGH (ref 11.6–15.2)
Prothrombin Time: 13.9 seconds (ref 11.6–15.2)

## 2014-02-02 LAB — GLUCOSE, CAPILLARY
GLUCOSE-CAPILLARY: 156 mg/dL — AB (ref 70–99)
Glucose-Capillary: 95 mg/dL (ref 70–99)
Glucose-Capillary: 163 mg/dL — ABNORMAL HIGH (ref 70–99)

## 2014-02-02 SURGERY — LEFT HEART CATH AND CORONARY ANGIOGRAPHY

## 2014-02-02 MED ORDER — NITROGLYCERIN IN D5W 200-5 MCG/ML-% IV SOLN
INTRAVENOUS | Status: AC
  Filled 2014-02-02: qty 250

## 2014-02-02 MED ORDER — METFORMIN HCL ER 500 MG PO TB24
500.0000 mg | ORAL_TABLET | Freq: Every day | ORAL | Status: DC
Start: 2014-02-04 — End: 2014-02-03

## 2014-02-02 MED ORDER — SODIUM CHLORIDE 0.9 % IV SOLN: 250.0000 mL | INTRAVENOUS | Status: DC | PRN

## 2014-02-02 MED ORDER — NITROGLYCERIN 0.4 MG SL SUBL: 0.4000 mg | SUBLINGUAL_TABLET | SUBLINGUAL | Status: DC | PRN

## 2014-02-02 MED ORDER — SODIUM CHLORIDE 0.9 % IJ SOLN: 3.0000 mL | INTRAMUSCULAR | Status: DC | PRN

## 2014-02-02 MED ORDER — CYANOCOBALAMIN 1000 MCG/ML IJ SOLN: 1000.0000 ug | INTRAMUSCULAR | Status: DC

## 2014-02-02 MED ORDER — ASPIRIN 81 MG PO CHEW: 81.0000 mg | CHEWABLE_TABLET | ORAL | Status: DC

## 2014-02-02 MED ORDER — PANTOPRAZOLE SODIUM 40 MG PO TBEC
40.0000 mg | DELAYED_RELEASE_TABLET | Freq: Every day | ORAL | Status: DC
  Administered 2014-02-02: 40 mg via ORAL
  Filled 2014-02-02: qty 1

## 2014-02-02 MED ORDER — BUPROPION HCL ER (XL) 300 MG PO TB24
300.0000 mg | ORAL_TABLET | Freq: Every day | ORAL | Status: DC
  Administered 2014-02-02: 18:00:00 300 mg via ORAL
  Filled 2014-02-02 (×2): qty 1

## 2014-02-02 MED ORDER — LIDOCAINE HCL (PF) 1 % IJ SOLN
INTRAMUSCULAR | Status: AC
  Filled 2014-02-02: qty 30

## 2014-02-02 MED ORDER — METOPROLOL TARTRATE 25 MG PO TABS: 25.0000 mg | ORAL_TABLET | Freq: Two times a day (BID) | ORAL | Status: DC

## 2014-02-02 MED ORDER — HYDRALAZINE HCL 20 MG/ML IJ SOLN
INTRAMUSCULAR | Status: AC
  Filled 2014-02-02: qty 1

## 2014-02-02 MED ORDER — ACETAMINOPHEN 325 MG PO TABS: 650.0000 mg | ORAL_TABLET | ORAL | Status: DC | PRN

## 2014-02-02 MED ORDER — FENTANYL CITRATE 0.05 MG/ML IJ SOLN
INTRAMUSCULAR | Status: AC
  Filled 2014-02-02: qty 2

## 2014-02-02 MED ORDER — ASPIRIN 81 MG PO CHEW
81.0000 mg | CHEWABLE_TABLET | Freq: Every day | ORAL | Status: DC
  Administered 2014-02-03: 81 mg via ORAL
  Filled 2014-02-02 (×2): qty 1

## 2014-02-02 MED ORDER — MIDAZOLAM HCL 2 MG/2ML IJ SOLN
INTRAMUSCULAR | Status: AC
  Filled 2014-02-02: qty 2

## 2014-02-02 MED ORDER — METOPROLOL TARTRATE 50 MG PO TABS
50.0000 mg | ORAL_TABLET | Freq: Two times a day (BID) | ORAL | Status: DC
  Administered 2014-02-02: 21:00:00 50 mg via ORAL
  Filled 2014-02-02: qty 1
  Filled 2014-02-02: qty 2
  Filled 2014-02-02 (×2): qty 1

## 2014-02-02 MED ORDER — HYDROCODONE-ACETAMINOPHEN 5-325 MG PO TABS: 1.0000 | ORAL_TABLET | Freq: Four times a day (QID) | ORAL | Status: DC | PRN

## 2014-02-02 MED ORDER — HEPARIN SODIUM (PORCINE) 1000 UNIT/ML IJ SOLN
INTRAMUSCULAR | Status: AC
  Filled 2014-02-02: qty 1

## 2014-02-02 MED ORDER — ISOSORBIDE MONONITRATE ER 60 MG PO TB24
60.0000 mg | ORAL_TABLET | Freq: Every day | ORAL | Status: DC
  Administered 2014-02-02: 60 mg via ORAL
  Filled 2014-02-02 (×2): qty 1

## 2014-02-02 MED ORDER — ONDANSETRON HCL 4 MG/2ML IJ SOLN: 4.0000 mg | Freq: Four times a day (QID) | INTRAMUSCULAR | Status: DC | PRN

## 2014-02-02 MED ORDER — CLOPIDOGREL BISULFATE 75 MG PO TABS: 75.0000 mg | ORAL_TABLET | Freq: Every day | ORAL | Status: DC

## 2014-02-02 MED ORDER — SODIUM CHLORIDE 0.9 % IV SOLN: INTRAVENOUS | Status: DC

## 2014-02-02 MED ORDER — NITROGLYCERIN 1 MG/10 ML FOR IR/CATH LAB
INTRA_ARTERIAL | Status: AC
  Filled 2014-02-02: qty 10

## 2014-02-02 MED ORDER — TAMSULOSIN HCL 0.4 MG PO CAPS
0.4000 mg | ORAL_CAPSULE | Freq: Every day | ORAL | Status: DC
  Administered 2014-02-02: 18:00:00 0.4 mg via ORAL
  Filled 2014-02-02 (×2): qty 1

## 2014-02-02 MED ORDER — CLOPIDOGREL BISULFATE 75 MG PO TABS
75.0000 mg | ORAL_TABLET | Freq: Every day | ORAL | Status: DC
  Administered 2014-02-03: 08:00:00 75 mg via ORAL
  Filled 2014-02-02: qty 1

## 2014-02-02 MED ORDER — ASPIRIN EC 81 MG PO TBEC: 81.0000 mg | DELAYED_RELEASE_TABLET | Freq: Every day | ORAL | Status: DC

## 2014-02-02 MED ORDER — SODIUM CHLORIDE 0.9 % IJ SOLN: 3.0000 mL | Freq: Two times a day (BID) | INTRAMUSCULAR | Status: DC

## 2014-02-02 MED ORDER — SODIUM CHLORIDE 0.9 % IV SOLN: INTRAVENOUS | Status: AC

## 2014-02-02 MED ORDER — HEPARIN (PORCINE) IN NACL 2-0.9 UNIT/ML-% IJ SOLN
INTRAMUSCULAR | Status: AC
  Filled 2014-02-02: qty 1000

## 2014-02-02 NOTE — CV Procedure (Signed)
PROCEDURE:  Left heart catheterization with selective coronary angiography, left ventriculogram.  INDICATIONS:  Angina   The risks, benefits, and details of the procedure were explained to the patient.  The patient verbalized understanding and wanted to proceed.  Informed written consent was obtained.  PROCEDURE TECHNIQUE:  After Xylocaine anesthesia an 11F 45 cm Terumo sheath was placed in the right femoral artery with a single anterior needle wall stick.  An Amplatz super stiff wire was used for support after predilating with a 6 [place] dilator. Left coronary angiography was done using a Judkins CLS 3.5 guide catheter.  Right coronary angiography was done using an AL-1 guide catheter.  Left heart catheterization was performed using the AL-1.   The attempted intervention was performed. Please see below for details. Hemostasis in the left groin was achieved with a 6 French Angio-Seal. Due to calcification in the femoral artery and being close to the bifurcation, manual compression will be used for the right groin hemostasis.   CONTRAST:  Total of 190 cc.  COMPLICATIONS:  None.    HEMODYNAMICS:  Aortic pressure was 143/58; LV pressure was 143/0; LVEDP 12.  There was no gradient between the left ventricle and aorta.    ANGIOGRAPHIC DATA:   The left main coronary artery is widely patent.  The left anterior descending artery is a large vessel proximally. There is mild disease in the mid vessel. There are several medium-sized septal perforator vessels which provided collaterals to the PDA.  The left circumflex artery is a large vessel with mild disease. There is a large epicardial collateral which is tortuous which connects directly to the posterior lateral artery.  The right coronary artery is occluded proximally and heavily diseased throughout the mid to distal portion. Reconstitution of the RCA occurs in the posterior lateral and posterior descending arteries.  LEFT VENTRICULOGRAM:  Left  ventricular angiogram was not done.  PCI NARRATIVE: IV heparin was used for anticoagulation. Multiple ECTs were used to check that the heparin was therapeutic. Initially, a fielder XT wire was advanced into the RCA. We are able to navigate past 2 proximal branches off the RCA. The wire would not proceed past the calcific proximal. Even with a balloon catheter support, the wire would not cross. We switched out for a Corsair catheter for support. The wire still would not cross. A miracle 3 g wire was then used without success. We switched her miracle 6 g wire. This did advance to the more distal RCA, likely subintimal. Did not make it any further into the PDA or posterolateral artery. We did not advance to an area that was suitable for reentry.  Attention was then turned to the retrograde approach. A pro-water wire was initially attempted to use to gain access to the septal, this was unsuccessful. We then used a fielder FC wire. A Turnpike catheter was advanced into the largest septal perforator. Multiple attempts with a sion wire were made to try and cross into the RCA territory distally. This was unsuccessful. The same technique was tried with 2 other septal perforators. We could not gain access to the distal RCA territory.  At that point, attention was then turned back to the antegrade approach. We attempted to not go a pilot 200 wire without success. We then tried the knuckle the fielder FC wire. This did not work either. We pulled the Corsair catheter back but the wire still did not advance.    Final angiography was performed. There was mild staining in his  septal perforator. The LAD was intact. Flow to the proximal branches and the RCA was still present. The patient was pain-free. During the procedure, he was hypertensive and IV nitroglycerin was used to bring his blood pressure down.   IMPRESSIONS:  1. Unsuccessful attempt at CTO PCI of the RCA. Both antegrade and retrograde approaches were  attempted. 2.   LVEDP 12 mmHg.    RECOMMENDATION:  Intensify medical therapy as tolerated for angina. If symptoms cannot be controlled, would have to consider using the epicardial collateral from the circumflex to get into the right. This would be a procedure with significantly more risk of perforation of the collateral. Hopefully, we can try to control his angina with other therapy.

## 2014-02-02 NOTE — H&P (View-Only) (Signed)
Patient ID: [name] [name], male   DOB: 1940-03-09, 73 y.o.   MRN: [id number]    [place], [place], [place]  [address] Phone: [phone] Fax:  [phone]  Date:  01/19/2014   ID:  Lemon, Sternberg [birth year], MRN [id number]  PCP:  Mathews Argyle, MD      History of Present Illness: [name] [name] is a 73 y.o. male who had a cath a month ago showing a chronic total occlusion of his RCA.  He continues to have discomfort in his chest.  It comes on with walking.  He uses NTG several times a day.  Sometimes, he will have sharp pains in the center of his chest while watching TV.    He feels that sx are getting worse.  They are very limiting.  Dr. Melvern Banker did his prior cath many years ago.  Apparently, his RCA was occluded in 1993. He had a repeat catheterization in 2009 which showed a short RCA occlusion with left to right collaterals. He has never had attempted intervention on this vessel.   Wt Readings from Last 3 Encounters:  01/19/14 253 lb 6.4 oz (114.941 kg)  12/01/13 250 lb (113.399 kg)  11/17/13 249 lb (112.946 kg)     Past Medical History  Diagnosis Date  . MI (myocardial infarction)   . Gastroesophageal reflux   . Hyperlipidemia LDL goal < 70   . COPD (chronic obstructive pulmonary disease)   . Tobacco abuse   . Right bundle branch block   . Peptic ulcer disease   . Coronary artery disease 2009  . Chronic low back pain   . B12 deficiency   . Sleep apnea   . Vertebral compression fracture 1970  . Depression   . BPH (benign prostatic hyperplasia)   . Hyperglycemia   . Gynecomastia 06/2009  . Adenomatous polyp 2007    Current Outpatient Prescriptions  Medication Sig Dispense Refill  . aspirin 81 MG tablet Take 81 mg by mouth daily.    Marland Kitchen buPROPion (WELLBUTRIN XL) 300 MG 24 hr tablet Take 300 mg by mouth daily.    . cyanocobalamin (,VITAMIN B-12,) 1000 MCG/ML injection Inject 1,000 mcg into the muscle every 30 (thirty) days.    .  hydrochlorothiazide (MICROZIDE) 12.5 MG capsule   1  . HYDROcodone-acetaminophen (NORCO/VICODIN) 5-325 MG per tablet Take 1 tablet by mouth every 6 (six) hours as needed for severe pain.     . metFORMIN (GLUCOPHAGE-XR) 500 MG 24 hr tablet   1  . metoprolol tartrate (LOPRESSOR) 25 MG tablet Take 1 tablet (25 mg total) by mouth 2 (two) times daily. 60 tablet 1  . nitroGLYCERIN (NITROSTAT) 0.4 MG SL tablet Place 0.4 mg under the tongue every 5 (five) minutes as needed for chest pain.    . pantoprazole (PROTONIX) 40 MG tablet Take 1 tablet (40 mg total) by mouth daily. 90 tablet 1  . rosuvastatin (CRESTOR) 10 MG tablet Take 10 mg by mouth daily.    . tamsulosin (FLOMAX) 0.4 MG CAPS capsule Take 0.4 mg by mouth daily.     Marland Kitchen triamcinolone (KENALOG) 0.025 % cream Apply 1 application topically daily.    . urea (CARMOL) 40 % ointment Apply 1 application topically 2 (two) times daily.    . clopidogrel (PLAVIX) 75 MG tablet Take 1 tablet (75 mg total) by mouth daily. 30 tablet 3  . isosorbide mononitrate (IMDUR) 60 MG 24 hr tablet Take 1 tablet (60 mg total)  by mouth daily. 30 tablet 3   No current facility-administered medications for this visit.    Allergies:    Allergies  Allergen Reactions  . Lipitor [Atorvastatin]     Muscle weakness  . Sulfa Antibiotics   . Vytorin [Ezetimibe-Simvastatin]     Body aches    Social History:  The patient  reports that he quit smoking about 16 months ago. He does not have any smokeless tobacco history on file. He reports that he does not drink alcohol or use illicit drugs.   Family History:  The patient's family history includes Alzheimer's disease (age of onset: 80) in his mother; Diabetes in his daughter; Diabetes (age of onset: 23) in his father; Fibromyalgia (age of onset: 63) in his sister; Hypertension in his son; Obesity in his daughter; Peripheral vascular disease in his father; Pneumonia in his father.   ROS:  Please see the history of present illness.   No nausea, vomiting.  No fevers, chills.  No focal weakness.  No dysuria.    All other systems reviewed and negative.   PHYSICAL EXAM: VS:  BP 118/58 mmHg  Pulse 60  Ht 5' 8.5" (1.74 m)  Wt 253 lb 6.4 oz (114.941 kg)  BMI 37.96 kg/m2 General: Well developed, well nourished, in no acute distress HEENT: normal Neck: no JVD, no carotid bruits Cardiac:  normal S1, S2; RRR;  Lungs:  clear to auscultation bilaterally, no wheezing, rhonchi or rales Abd: soft, nontender, no hepatomegaly Ext: no edema,  Skin: warm and dry;  Neuro:   no focal abnormalities noted Psych: normal affect     ASSESSMENT AND PLAN:  1. CAD: Chronic total occlusion of the RCA.  He continues to have symptoms despite 2 antianginal medicines. Will change his long release nitrates to once a day Imdur.  His beta blocker cannot be titrated any further as he is having significant lightheadedness when he stands quickly. This started after starting on a beta blocker for antianginal. Also, his heart rate would limit this. The risks and benefits of CTO PCI were explained to the patient including renal dysfunction, radiation exposure, vessel dissection and perforation, pericardial tamponade on and death. He is willing to proceed. He understands that this may take 2 attempts. We also discussed the possibility of using rotational atherectomy, which is highly likely given the amount of calcium in this vessel. All questions were answered.  Will add clopidogrel 75 mg daily as well.  Change PPI to Protonix.  25 minutes was spent face to face with the patient counseling regarding this procedure.  2. Obesity This will be an issue for radiation exposure during the procedure as he will need high doses of radiation for imaging.  Explained that we may have to stop the procedure due to reaching a radiation exposure limit.   Signed, Mina Marble, MD, [facility] 01/19/2014 10:16 AM

## 2014-02-02 NOTE — Interval H&P Note (Signed)
Cath Lab Visit (complete for each Cath Lab visit)  Clinical Evaluation Leading to the Procedure:   ACS: No.  Non-ACS:    Anginal Classification: CCS III  Anti-ischemic medical therapy: Maximal Therapy (2 or more classes of medications)  Non-Invasive Test Results: No non-invasive testing performed  Prior CABG: No previous CABG   Ischemic Symptoms? CCS III (Marked limitation of ordinary activity) Anti-ischemic Medical Therapy? Maximal Medical Therapy (2 or more classes of medications) Non-invasive Test Results? No non-invasive testing performed Prior CABG? No Previous CABG   Patient Information:   1-2V CAD, no prox LAD  A (7)  Indication: 20; Score: 7   Patient Information:   1-2V-CAD with DS 50-60% With No FFR, No IVUS  I (3)  Indication: 21; Score: 3   Patient Information:   1-2V-CAD with DS 50-60% With FFR  A (7)  Indication: 22; Score: 7   Patient Information:   1-2V-CAD with DS 50-60% With FFR>0.8, IVUS not significant  I (2)  Indication: 23; Score: 2   Patient Information:   3V-CAD without LMCA With Abnormal LV systolic function  A (9)  Indication: 48; Score: 9   Patient Information:   LMCA-CAD  A (9)  Indication: 49; Score: 9   Patient Information:   2V-CAD with prox LAD PCI  A (7)  Indication: 62; Score: 7   Patient Information:   2V-CAD with prox LAD CABG  A (8)  Indication: 62; Score: 8   Patient Information:   3V-CAD without LMCA With Low CAD burden(i.e., 3 focal stenoses, low SYNTAX score) PCI  A (7)  Indication: 63; Score: 7   Patient Information:   3V-CAD without LMCA With Low CAD burden(i.e., 3 focal stenoses, low SYNTAX score) CABG  A (9)  Indication: 63; Score: 9   Patient Information:   3V-CAD without LMCA E06c - Intermediate-high CAD burden (i.e., multiple diffuse lesions, presence of CTO, or high SYNTAX score) PCI  U (4)  Indication: 64; Score: 4   Patient Information:   3V-CAD without  LMCA E06c - Intermediate-high CAD burden (i.e., multiple diffuse lesions, presence of CTO, or high SYNTAX score) CABG  A (9)  Indication: 64; Score: 9   Patient Information:   LMCA-CAD With Isolated LMCA stenosis  PCI  U (6)  Indication: 65; Score: 6   Patient Information:   LMCA-CAD With Isolated LMCA stenosis  CABG  A (9)  Indication: 65; Score: 9   Patient Information:   LMCA-CAD Additional CAD, low CAD burden (i.e., 1- to 2-vessel additional involvement, low SYNTAX score) PCI  U (5)  Indication: 66; Score: 5   Patient Information:   LMCA-CAD Additional CAD, low CAD burden (i.e., 1- to 2-vessel additional involvement, low SYNTAX score) CABG  A (9)  Indication: 66; Score: 9   Patient Information:   LMCA-CAD Additional CAD, intermediate-high CAD burden (i.e., 3-vessel involvement, presence of CTO, or high SYNTAX score) PCI  I (3)  Indication: 67; Score: 3   Patient Information:   LMCA-CAD Additional CAD, intermediate-high CAD burden (i.e., 3-vessel involvement, presence of CTO, or high SYNTAX score) CABG  A (9)  Indication: 67; Score: 9    History and Physical Interval Note:  02/02/2014 8:40 AM  [name] [name]  has presented today for surgery, with the diagnosis of cad  The various methods of treatment have been discussed with the patient and family. After consideration of risks, benefits and other options for treatment, the patient has consented to  Procedure(s): PERCUTANEOUS CORONARY STENT  INTERVENTION (PCI-S) (N/A) as a surgical intervention .  The patient's history has been reviewed, patient examined, no change in status, stable for surgery.  I have reviewed the patient's chart and labs.  Questions were answered to the patient's satisfaction.     [name] [name] S.

## 2014-02-03 ENCOUNTER — Encounter (HOSPITAL_COMMUNITY): Payer: Self-pay | Admitting: Interventional Cardiology

## 2014-02-03 DIAGNOSIS — I2582 Chronic total occlusion of coronary artery: Secondary | ICD-10-CM | POA: Diagnosis not present

## 2014-02-03 DIAGNOSIS — I209 Angina pectoris, unspecified: Secondary | ICD-10-CM

## 2014-02-03 DIAGNOSIS — J449 Chronic obstructive pulmonary disease, unspecified: Secondary | ICD-10-CM | POA: Diagnosis not present

## 2014-02-03 DIAGNOSIS — I25119 Atherosclerotic heart disease of native coronary artery with unspecified angina pectoris: Secondary | ICD-10-CM | POA: Diagnosis not present

## 2014-02-03 DIAGNOSIS — I252 Old myocardial infarction: Secondary | ICD-10-CM | POA: Diagnosis not present

## 2014-02-03 LAB — CBC
HCT: 38 % — ABNORMAL LOW (ref 39.0–52.0)
Hemoglobin: 12.6 g/dL — ABNORMAL LOW (ref 13.0–17.0)
MCH: 30.1 pg (ref 26.0–34.0)
MCHC: 33.2 g/dL (ref 30.0–36.0)
MCV: 90.7 fL (ref 78.0–100.0)
Platelets: 159 10*3/uL (ref 150–400)
RBC: 4.19 MIL/uL — ABNORMAL LOW (ref 4.22–5.81)
RDW: 13.8 % (ref 11.5–15.5)
WBC: 6.4 10*3/uL (ref 4.0–10.5)

## 2014-02-03 LAB — BASIC METABOLIC PANEL
Anion gap: 11 (ref 5–15)
BUN: 13 mg/dL (ref 6–23)
CO2: 24 mEq/L (ref 19–32)
Calcium: 9.1 mg/dL (ref 8.4–10.5)
Chloride: 99 mEq/L (ref 96–112)
Creatinine, Ser: 0.64 mg/dL (ref 0.50–1.35)
GFR calc Af Amer: >90 mL/min (ref >90)
GFR calc non Af Amer: >90 mL/min (ref >90)
Glucose, Bld: 118 mg/dL — ABNORMAL HIGH (ref 70–99)
Potassium: 3.9 mEq/L (ref 3.7–5.3)
Sodium: 134 mEq/L — ABNORMAL LOW (ref 137–147)

## 2014-02-03 LAB — GLUCOSE, CAPILLARY: Glucose-Capillary: 265 mg/dL — ABNORMAL HIGH (ref 70–99)

## 2014-02-03 MED ORDER — INSULIN ASPART 100 UNIT/ML ~~LOC~~ SOLN
0.0000 [IU] | Freq: Three times a day (TID) | SUBCUTANEOUS | Status: DC
  Administered 2014-02-03: 08:00:00 8 [IU] via SUBCUTANEOUS

## 2014-02-03 MED ORDER — RANOLAZINE ER 500 MG PO TB12
500.0000 mg | ORAL_TABLET | Freq: Two times a day (BID) | ORAL | Status: DC
  Filled 2014-02-03 (×2): qty 1

## 2014-02-03 MED ORDER — RANOLAZINE ER 500 MG PO TB12: 500.0000 mg | ORAL_TABLET | Freq: Two times a day (BID) | ORAL | Status: DC

## 2014-02-03 MED ORDER — METFORMIN HCL ER 500 MG PO TB24
500.0000 mg | ORAL_TABLET | Freq: Every day | ORAL | Status: DC
Start: 2014-02-03 — End: 2016-07-10

## 2014-02-03 NOTE — Progress Notes (Signed)
CARDIAC REHAB PHASE I   PRE:  Rate/Rhythm: 75 SR  BP:  Supine:   Sitting: 105/50  Standing:    SaO2:   MODE:  Ambulation: 500 ft   POST:  Rate/Rhythm: 78 SR  BP:  Supine:   Sitting: 95/61  Standing:    SaO2:  0800-0850 Pt walked 500 ft with hand held asst, stopping once due to SOB to rest. No CP. Pt stated he can only walk about 300 ft at a time due to SOB from COPD. Encouraged walking but did not give ex ed due to limitation from COPD. Pt not interested in CRP 2.  Reviewed NTG use and gave diabetic diet. Pt stated he is a  New diabetic. Discussed watching his carbs. Pt stated his wife is a diabetic and they attended some diet classes.   Graylon Good, RN BSN  02/03/2014 8:47 AM

## 2014-02-03 NOTE — Discharge Summary (Signed)
CARDIOLOGY DISCHARGE SUMMARY   Patient ID: [name] [name] MRN: [id number] DOB/AGE: May 01, 1940 73 y.o.  Admit date: 02/02/2014 Discharge date: 02/03/2014  PCP: Mathews Argyle, MD Primary Cardiologist: Dr. Beau Fanny  Primary Discharge Diagnosis:  Angina pectoris Secondary Discharge Diagnosis:   Procedures: Left heart catheterization with selective coronary angiography, left ventriculogram  Hospital Course: [name] [name] is a 73 y.o. male with a history of CAD. He was having continued anginal symptoms despite 2 antianginal medications. Dr. Beau Fanny reviewed previous data and noted that there was a chronic total occlusion of the RCA. Procedures have been developed to perform PCI on these vessels and he discussed the risks and benefits of this procedure with the patient. [name] agreed to the procedure and came to the hospital on 12/09 for this.  Procedure results are below. He had unsuccessful PCI of his CTO RCA and tolerated the procedure well. He was held overnight and hydrated.  On 12/10, he was seen by Dr. Beau Fanny and all data were reviewed. [name] had been seen by cardiac rehabilitation and educated on heart-healthy lifestyle modifications and exercise guidelines. He had ecchymosis on both cath sites and bilateral femoral bruits were noted but no significant hematemesis. The patient and his wife were educated on managing this and contacting our office for any concerns.  On 12/10 [name] was ambulating without chest pain or shortness of breath. No further inpatient workup was indicated and he is considered stable for discharge, to follow up as an outpatient.  BP 113/62 mmHg  Pulse 63  Temp(Src) 98.1 F (36.7 C) (Oral)  Resp 18  Ht 5\' 9"  (1.753 m)  Wt 251 lb 12.6 oz (114.21 kg)  BMI 37.17 kg/m2  SpO2 96%  General: Well developed, well nourished, male in no acute distress Head: Eyes PERRLA, No xanthomas.   Normocephalic and atraumatic  Lungs: Clear bilaterally to  auscultation. Heart: HRRR S1 S2, without MRG.  Pulses are 2+ & equal. No JVD.  Abdomen: Bowel sounds are present, abdomen soft and non-tender without masses or  hernias noted. Msk: Normal strength and tone for age. Extremities: No clubbing, cyanosis or edema.  Right groin cath site small amount of ecchymosis, small hematoma, left groin cath site with moderate ecchymosis and 3 cm hematoma. Bilateral femoral bruits noted. Skin:  No rashes or lesions noted. Neuro: Alert and oriented X 3. Psych:  Good affect, responds appropriately  Labs: Lab Results  Component Value Date   WBC 6.4 02/03/2014   HGB 12.6* 02/03/2014   HCT 38.0* 02/03/2014   MCV 90.7 02/03/2014   PLT 159 02/03/2014     Recent Labs Lab 02/03/14 0335  NA 134*  K 3.9  CL 99  CO2 24  BUN 13  CREATININE 0.64  CALCIUM 9.1  GLUCOSE 118*    Recent Labs  02/02/14 0854  INR 1.05   Cardiac Cath:  ANGIOGRAPHIC DATA: The left main coronary artery is widely patent. The left anterior descending artery is a large vessel proximally. There is mild disease in the mid vessel. There are several medium-sized septal perforator vessels which provided collaterals to the PDA. The left circumflex artery is a large vessel with mild disease. There is a large epicardial collateral which is tortuous which connects directly to the posterior lateral artery. The right coronary artery is occluded proximally and heavily diseased throughout the mid to distal portion. Reconstitution of the RCA occurs in the posterior lateral and posterior descending arteries. IMPRESSIONS: 1. Unsuccessful attempt at CTO  PCI of the RCA. Both antegrade and retrograde approaches were attempted. 2.  LVEDP 12 mmHg. RECOMMENDATION: Intensify medical therapy as tolerated for angina. If symptoms cannot be controlled, would have to consider using the epicardial collateral from the circumflex to get into the right. This would be a procedure with significantly more risk  of perforation of the collateral. Hopefully, we can try to control his angina with other therapy.  EKG: SR, RBBB  FOLLOW UP PLANS AND APPOINTMENTS Allergies  Allergen Reactions  . Lipitor [Atorvastatin] Other (See Comments)    Muscle weakness  . Sulfa Antibiotics Hives  . Vytorin [Ezetimibe-Simvastatin] Other (See Comments)    Body aches     Medication List    TAKE these medications        aspirin EC 81 MG tablet  Take 81 mg by mouth daily.     buPROPion 300 MG 24 hr tablet  Commonly known as:  WELLBUTRIN XL  Take 300 mg by mouth daily.     clopidogrel 75 MG tablet  Commonly known as:  PLAVIX  Take 1 tablet (75 mg total) by mouth daily.     cyanocobalamin 1000 MCG/ML injection  Commonly known as:  (VITAMIN B-12)  Inject 1,000 mcg into the muscle every 30 (thirty) days. No specific day of the month. Administered by daughter.     hydrochlorothiazide 12.5 MG capsule  Commonly known as:  MICROZIDE  Take 12.5 mg by mouth daily.     HYDROcodone-acetaminophen 5-325 MG per tablet  Commonly known as:  NORCO/VICODIN  Take 1 tablet by mouth every 6 (six) hours as needed for severe pain.     isosorbide mononitrate 60 MG 24 hr tablet  Commonly known as:  IMDUR  Take 1 tablet (60 mg total) by mouth daily.     metFORMIN 500 MG 24 hr tablet  Commonly known as:  GLUCOPHAGE-XR  Take 1 tablet (500 mg total) by mouth daily with breakfast. HOLD for 48 hours. Restart on 02/05/2014     metoprolol tartrate 25 MG tablet  Commonly known as:  LOPRESSOR  Take 1 tablet (25 mg total) by mouth 2 (two) times daily.     nitroGLYCERIN 0.4 MG SL tablet  Commonly known as:  NITROSTAT  Place 0.4 mg under the tongue every 5 (five) minutes as needed for chest pain.     pantoprazole 40 MG tablet  Commonly known as:  PROTONIX  Take 1 tablet (40 mg total) by mouth daily.     ranolazine 500 MG 12 hr tablet  Commonly known as:  RANEXA  Take 1 tablet (500 mg total) by mouth 2 (two) times daily.      tamsulosin 0.4 MG Caps capsule  Commonly known as:  FLOMAX  Take 0.4 mg by mouth daily.        Discharge Instructions    Diet - low sodium heart healthy    Complete by:  As directed      Diet Carb Modified    Complete by:  As directed      Increase activity slowly    Complete by:  As directed           Follow-up Information    Follow up with Jettie Booze., MD.   Specialty:  Interventional Cardiology   Why:  The office will call   Contact information:   [address] [place] [address] [phone]       BRING ALL MEDICATIONS WITH YOU TO FOLLOW UP APPOINTMENTS  Time spent with patient to include physician time: 38 min Signed: Rosaria Ferries, PA-C 02/03/2014, 10:14 AM Co-Sign MD  I have examined the patient and reviewed assessment and plan and discussed with patient.  Agree with above as stated.  Unsuccessful PCI attempt.  COntinue medical therapy.  We discussed that if he has refractory sx, we could try to use a large epicardial collateral to help cross retrograde.  We used only antegrade approach and septal perforators retrograde during this attempt.  Ranexa started this admission.  Will see if this helps with anginal sx. Would try to avoid sending for CABG since it would be for only one vessel.   Xxavier Noon S.

## 2014-02-03 NOTE — Discharge Instructions (Signed)
PLEASE REMEMBER TO BRING ALL OF YOUR MEDICATIONS TO EACH OF YOUR FOLLOW-UP OFFICE VISITS. ° °PLEASE ATTEND ALL SCHEDULED FOLLOW-UP APPOINTMENTS.  ° °Activity: Increase activity slowly as tolerated. You may shower, but no soaking baths (or swimming) for 1 week. No driving for 2 days. No lifting over 5 lbs for 1 week. No sexual activity for 1 week.  ° °You May Return to Work: in 1 week (if applicable) ° °Wound Care: You may wash cath site gently with soap and water. Keep cath site clean and dry. If you notice pain, swelling, bleeding or pus at your cath site, please call [phone]. ° ° ° °Cardiac Cath Site Care °Refer to this sheet in the next few weeks. These instructions provide you with information on caring for yourself after your procedure. Your caregiver may also give you more specific instructions. Your treatment has been planned according to current medical practices, but problems sometimes occur. Call your caregiver if you have any problems or questions after your procedure. °HOME CARE INSTRUCTIONS °· You may shower 24 hours after the procedure. Remove the bandage (dressing) and gently wash the site with plain soap and water. Gently pat the site dry.  °· Do not apply powder or lotion to the site.  °· Do not sit in a bathtub, swimming pool, or whirlpool for 5 to 7 days.  °· No bending, squatting, or lifting anything over 10 pounds (4.5 kg) as directed by your caregiver.  °· Inspect the site at least twice daily.  °· Do not drive home if you are discharged the same day of the procedure. Have someone else drive you.  °· You may drive 24 hours after the procedure unless otherwise instructed by your caregiver.  °What to expect: °· Any bruising will usually fade within 1 to 2 weeks.  °· Blood that collects in the tissue (hematoma) may be painful to the touch. It should usually decrease in size and tenderness within 1 to 2 weeks.  °SEEK IMMEDIATE MEDICAL CARE IF: °· You have unusual pain at the site or down the  affected limb.  °· You have redness, warmth, swelling, or pain at the site.  °· You have drainage (other than a small amount of blood on the dressing).  °· You have chills.  °· You have a fever or persistent symptoms for more than 72 hours.  °· You have a fever and your symptoms suddenly get worse.  °· Your leg becomes pale, cool, tingly, or numb.  °· You have heavy bleeding from the site. Hold pressure on the site.  °Document Released: 03/16/2010 Document Revised: 01/31/2011 Document Reviewed: 03/16/2010 °[employer]® Patient Information ©2012 [employer]. ° °

## 2014-06-14 NOTE — Op Note (Signed)
PATIENT NAME:  [name] [name], [name] [name] MR#:  [id number] DATE OF BIRTH:  02-18-1941  DATE OF PROCEDURE:  10/29/2011  PREOPERATIVE DIAGNOSIS: Visually significant cataract of the right eye.   POSTOPERATIVE DIAGNOSIS: Visually significant cataract of the right eye.   OPERATIVE PROCEDURE: Cataract extraction by phacoemulsification with implant of intraocular lens to right eye.   SURGEON: Birder Robson, MD.   ANESTHESIA:  1. Managed anesthesia care.  2. Topical tetracaine drops followed by 2% Xylocaine jelly applied in the preoperative holding area.   COMPLICATIONS: None.   TECHNIQUE:  Stop and chop.  DESCRIPTION OF PROCEDURE: The patient was examined and consented in the preoperative holding area where the aforementioned topical anesthesia was applied to the right eye and then brought back to the Operating Room where the right eye was prepped and draped in the usual sterile ophthalmic fashion and a lid speculum was placed. A paracentesis was created with the side port blade and the anterior chamber was filled with viscoelastic. A near clear corneal incision was performed with the steel keratome. A continuous curvilinear capsulorrhexis was performed with a cystotome followed by the capsulorrhexis forceps. Hydrodissection and hydrodelineation were carried out with BSS on a blunt cannula. The lens was removed in a stop and chop technique and the remaining cortical material was removed with the irrigation-aspiration handpiece. The capsular bag was inflated with viscoelastic and the Tecnis ZCB00 16.5-diopter lens, serial number [id number] was placed in the capsular bag without complication. The remaining viscoelastic was removed from the eye with the irrigation-aspiration handpiece. The wounds were hydrated. The anterior chamber was flushed with Miostat and the eye was inflated to physiologic pressure. The wounds were found to be water tight. The eye was dressed with Vigamox. The patient was given protective  glasses to wear throughout the day and a shield with which to sleep tonight. The patient was also given drops with which to begin a drop regimen today and will follow-up with me in one day.  ____________________________ [name]. Kang Ishida, MD wlp:slb D: 10/29/2011 12:29:10 ET T: 10/29/2011 13:08:51 ET JOB#: [id number]  cc: Corinne Goucher L. Shakendra Griffeth, MD, <Dictator> [name] Jonna Dittrich MD ELECTRONICALLY SIGNED 10/30/2011 13:07

## 2014-06-19 NOTE — Op Note (Signed)
PATIENT NAME:  [name] [name], [name] [name] MR#:  [id number] DATE OF BIRTH:  April 29, 1940  DATE OF PROCEDURE:  06/18/2011  PREOPERATIVE DIAGNOSIS: Visually significant cataract of the left eye.   POSTOPERATIVE DIAGNOSIS: Visually significant cataract of the left eye.   OPERATIVE PROCEDURE: Cataract extraction by phacoemulsification with implant of intraocular lens to left eye.   SURGEON: Birder Robson, MD.   ANESTHESIA:  1. Managed anesthesia care.  2. Topical tetracaine drops followed by 2% Xylocaine jelly applied in the preoperative holding area.   COMPLICATIONS: None.   TECHNIQUE:  Four quadrant divide-and-conquer.   DESCRIPTION OF PROCEDURE: The patient was examined and consented in the preoperative holding area where the aforementioned topical anesthesia was applied to the left eye and then brought back to the Operating Room where the left eye was prepped and draped in the usual sterile ophthalmic fashion and a lid speculum was placed. A paracentesis was created with the side port blade and the anterior chamber was filled with viscoelastic. A near clear corneal incision was performed with the steel keratome. A continuous curvilinear capsulorrhexis was performed with a cystotome followed by the capsulorrhexis forceps. Hydrodissection and hydrodelineation were carried out with BSS on a blunt cannula. The lens was removed in a four quadrant divide-and-conquer technique and the remaining cortical material was removed with the irrigation-aspiration handpiece. The capsular bag was inflated with viscoelastic and the Technis ZCB00 16.5-diopter lens, serial number [id number] was placed in the capsular bag without complication. The remaining viscoelastic was removed from the eye with the irrigation-aspiration handpiece. The wounds were hydrated. The anterior chamber was flushed with Miostat and the eye was inflated to physiologic pressure. The wounds were found to be water tight. The eye was dressed with Vigamox. The  patient was given protective glasses to wear throughout the day and a shield with which to sleep tonight. The patient was also given drops with which to begin a drop regimen today and will follow-up with me in one day.   ____________________________ [name]. [name], MD wlp:cms D: 06/18/2011 12:26:17 ET T: 06/18/2011 12:35:44 ET JOB#: [id number]  cc: [name], MD, <Dictator> [name] MD ELECTRONICALLY SIGNED 06/25/2011 11:41

## 2014-07-14 ENCOUNTER — Other Ambulatory Visit: Payer: Self-pay | Admitting: Interventional Cardiology

## 2014-08-17 ENCOUNTER — Other Ambulatory Visit: Payer: Self-pay | Admitting: Interventional Cardiology

## 2014-10-07 ENCOUNTER — Other Ambulatory Visit: Payer: Self-pay | Admitting: Interventional Cardiology

## 2014-10-07 NOTE — Telephone Encounter (Signed)
Should this still be 25mg  bid? The pharmacy is requesting 50mg  bid. Please advise. Thanks, MI

## 2014-10-25 ENCOUNTER — Telehealth: Payer: Self-pay | Admitting: Interventional Cardiology

## 2014-10-25 NOTE — Telephone Encounter (Signed)
New message  Per daughter:  Pt c/o of Chest Pain: STAT if CP now or developed within 24 hours  1. Are you having CP right now? No  2. Are you experiencing any other symptoms (ex. SOB, nausea, vomiting, sweating)? SOB   3. How long have you been experiencing CP? Gotten worse over past 3-4 weeks  4. Is your CP continuous or coming and going? Comes and goes on daily basis  5. Have you taken Nitroglycerin? Yes  Pt had chest pain on Sunday and took nitro and it did not help with c/p Pt daughter had pt to take an extra baby aspirin on Sunday and it did help with symptoms Daughter wanting to know if pt can be seen sooner than appointment in November Daughter says pt is not on a blood thinner and daughter wanting to know if he should be on blood thinner If you cannot reach daughter on mobile #, please call ofc # [phone] and have her paged at work?

## 2014-10-25 NOTE — Telephone Encounter (Signed)
Returned pt daughter call. She sts there is some confusion with pt meds. She sts that pt has not been taking plavix or Ranexa. She was at pt home yesterday and wrote down what pt has on hand. She confirms that pt is taking Imdur and Metoprolol. Pt has daily chest discomfort, and uses sub nitro daily.  Discussed with Armanda Heritage  Pt daughter aware. appt scheduled with Armanda Heritage on 8/31 @ 1:30pm.

## 2014-10-26 ENCOUNTER — Ambulatory Visit (INDEPENDENT_AMBULATORY_CARE_PROVIDER_SITE_OTHER): Payer: Medicare Other | Admitting: Physician Assistant

## 2014-10-26 ENCOUNTER — Encounter: Payer: Self-pay | Admitting: Physician Assistant

## 2014-10-26 VITALS — BP 98/63 | HR 56 | Ht 69.0 in | Wt 242.6 lb

## 2014-10-26 DIAGNOSIS — I209 Angina pectoris, unspecified: Secondary | ICD-10-CM | POA: Diagnosis not present

## 2014-10-26 DIAGNOSIS — R079 Chest pain, unspecified: Secondary | ICD-10-CM

## 2014-10-26 MED ORDER — NITROGLYCERIN 0.4 MG SL SUBL: 0.4000 mg | SUBLINGUAL_TABLET | SUBLINGUAL | Status: DC | PRN

## 2014-10-26 MED ORDER — ISOSORBIDE MONONITRATE ER 30 MG PO TB24: 30.0000 mg | ORAL_TABLET | Freq: Every day | ORAL | Status: DC

## 2014-10-26 NOTE — Patient Instructions (Addendum)
Medication Instructions:  Your physician has recommended you make the following change in your medication:  1) STOP Isosorbide dinitrate (Isordil) 2) START Isosorbide mononitrate (Imdur) 30 mg tablet by mouth DAILY   Labwork: NONE  Testing/Procedures: NONE  Follow-Up: Your physician recommends that you schedule a follow-up appointment with Dr. [name] next available.    Any Other Special Instructions Will Be Listed Below (If Applicable).

## 2014-10-26 NOTE — Progress Notes (Signed)
Cardiology Office Note   Date:  10/26/2014   ID:  [name], [name] [birth year], MRN [id number]  PCP:  Mathews Argyle, MD  Cardiologist:  Dr Gwynne Edinger, PA-C   Chief Complaint: Anginal chest pain  History of Present Illness: [name] [name] is a 74 y.o. male with a history of CTO RCA, unsuccessful PCI in 01/2014, GERD, hyperlipidemia, RBBB and DJD.   [name] [name] presents for evaluation of chest pain. Ever since before his heart catheterization in December 2015, he has dealt with his angina. It will occur with exertion. After he goes up a flight of stairs, he will have to stop at the top and sit down and rest and take a nitroglycerin.   He was on Ranexa for short period of time, but stated the co-pay was too high for him to be able to afford it. He did not get the prescription refilled. He is compliant with his other medications.   He is concerned because he keeps having to take nitroglycerin for the angina and is taking 4-5 a day in addition to the sublingual nitroglycerin before meals he takes to help with a GI problem (possibly esophageal spasm). He has not had chest pain that is unrelieved by sublingual nitroglycerin and he generally does not have to take more than one or 2 before symptoms are relieved.   Past Medical History  Diagnosis Date  . Gastroesophageal reflux   . Hyperlipidemia LDL goal < 70   . Tobacco abuse   . Right bundle branch block   . Peptic ulcer disease 1959  . Coronary artery disease 2009  . Chronic low back pain   . B12 deficiency   . Vertebral compression fracture 1970  . Depression   . BPH (benign prostatic hyperplasia)   . Hyperglycemia   . Gynecomastia 06/2009  . Adenomatous polyp 2007  . MI (myocardial infarction) 08/1989; 1993  . OSA on CPAP     "have mask; don't wear it" (02/02/2014)  . Arthritis     "my body"  . DDD (degenerative disc disease), cervical   . DDD (degenerative disc disease), lumbosacral   . DDD (degenerative  disc disease), thoracic   . Chronic back pain     "all over"  . Skin cancer     "burned/froze off my back" (02/02/2014)    Past Surgical History  Procedure Laterality Date  . Total knee arthroplasty Left 01/2009  . Joint replacement    . Cataract extraction w/ intraocular lens  implant, bilateral Bilateral ~ 2012  . Penile prosthesis implant  1990's  . Nasal septum surgery  1977  . Cardiac catheterization  1991; 1993; 08/2012; 11/2013; 02/02/2014    I've had 6-7 total  . Coronary angioplasty  1990's  . Esophagogastroduodenoscopy (egd) with esophageal dilation  11/2007  . Bypass graft popliteal to popliteal Left 06/2005    Dr Donnetta Hutching  . Bypass graft popliteal to popliteal Right 02/2001    Dr Deon Pilling  . Scrotal surgery  08/1999    pump revision/notes 07/11/2010  . Lower extremity angiogram Left 05/2005    L pop-pop BPG patent  . Left heart catheterization with coronary angiogram N/A 09/03/2012    Procedure: LEFT HEART CATHETERIZATION WITH CORONARY ANGIOGRAM;  Surgeon: Sinclair Grooms, MD;  Location: [facility] CATH LAB;  Service: Cardiovascular;  Laterality: N/A;  . Left heart catheterization with coronary angiogram N/A 12/01/2013    Procedure: LEFT HEART CATHETERIZATION WITH CORONARY ANGIOGRAM;  Surgeon: Mallie Mussel  Carlye Grippe, MD;  Location: [facility] CATH LAB;  Service: Cardiovascular;  Laterality: N/A;  . Cardiac catheterization  02/02/2014    Procedure: LEFT HEART CATH AND CORONARY ANGIOGRAPHY;  Surgeon: Jettie Booze, MD;  mild disease in LAD/CFX, RCA w/ CTO, unsuccessfull PCI both w/ antegrade and retrograde approaches     Current Outpatient Prescriptions  Medication Sig Dispense Refill  . aspirin EC 81 MG tablet Take 81 mg by mouth daily.    Marland Kitchen buPROPion (WELLBUTRIN XL) 300 MG 24 hr tablet Take 300 mg by mouth daily.    . cyanocobalamin (,VITAMIN B-12,) 1000 MCG/ML injection Inject 1,000 mcg into the muscle every 30 (thirty) days. No specific day of the month. Administered by daughter.    .  hydrochlorothiazide (MICROZIDE) 12.5 MG capsule Take 12.5 mg by mouth daily.    Marland Kitchen HYDROcodone-acetaminophen (NORCO/VICODIN) 5-325 MG per tablet Take 1 tablet by mouth every 6 (six) hours as needed for severe pain.     . metFORMIN (GLUCOPHAGE-XR) 500 MG 24 hr tablet Take 1 tablet (500 mg total) by mouth daily with breakfast. HOLD for 48 hours. Restart on 02/05/2014  1  . metoprolol (LOPRESSOR) 50 MG tablet TAKE ONE TABLET BY MOUTH TWICE DAILY 60 tablet 0  . metoprolol tartrate (LOPRESSOR) 25 MG tablet Take 1 tablet (25 mg total) by mouth 2 (two) times daily. 60 tablet 1  . nitroGLYCERIN (NITROSTAT) 0.4 MG SL tablet Place 1 tablet (0.4 mg total) under the tongue every 5 (five) minutes as needed for chest pain. 100 tablet 11  . pantoprazole (PROTONIX) 40 MG tablet Take 1 tablet (40 mg total) by mouth daily. 90 tablet 1  . rosuvastatin (CRESTOR) 10 MG tablet Take 10 mg by mouth daily.    . tamsulosin (FLOMAX) 0.4 MG CAPS capsule Take 0.4 mg by mouth daily.     . clopidogrel (PLAVIX) 75 MG tablet TAKE ONE TABLET BY MOUTH ONCE DAILY (Patient not taking: Reported on 10/26/2014) 30 tablet 0  . isosorbide mononitrate (IMDUR) 30 MG 24 hr tablet Take 1 tablet (30 mg total) by mouth daily. 90 tablet 3  . ranolazine (RANEXA) 500 MG 12 hr tablet Take 1 tablet (500 mg total) by mouth 2 (two) times daily. (Patient not taking: Reported on 10/26/2014) 60 tablet 11   No current facility-administered medications for this visit.    Allergies:   Lipitor; Sulfa antibiotics; and Vytorin    Social History:  The patient  reports that he quit smoking about 2 years ago. His smoking use included Cigarettes. He has a 59 pack-year smoking history. He has quit using smokeless tobacco. His smokeless tobacco use included Chew. He reports that he does not drink alcohol or use illicit drugs.   Family History:  The patient's family history includes Alzheimer's disease (age of onset: 32) in his mother; Diabetes in his daughter;  Diabetes (age of onset: 67) in his father; Fibromyalgia (age of onset: 27) in his sister; Hypertension in his son; Obesity in his daughter; Peripheral vascular disease in his father; Pneumonia in his father.    ROS:  Please see the history of present illness. All other systems are reviewed and negative.    PHYSICAL EXAM: VS:  BP 98/63 mmHg  Pulse 56  Ht 5\' 9"  (1.753 m)  Wt 242 lb 9.6 oz (110.043 kg)  BMI 35.81 kg/m2  SpO2 98% , BMI Body mass index is 35.81 kg/(m^2). GEN: Well nourished, well developed, in no acute distress HEENT: normal Neck: no JVD, carotid  bruits, or masses Cardiac: RRR; no murmurs, rubs, or gallops, no edema, distal pulses intact in all 4 extremities  Respiratory:  Few rales, good air exchange, normal work of breathing GI: soft, nontender, nondistended, + BS MS: no deformity or atrophy Skin: warm and dry, no rash Neuro:  Strength and sensation are intact Psych: euthymic mood, full affect   EKG:  EKG is ordered today. The ekg ordered today demonstrates sinus bradycardia, rate 56 with a right bundle branch block which is old.   Recent Labs: 02/03/2014: BUN 13; Creatinine, Ser 0.64; Hemoglobin 12.6*; Platelets 159; Potassium 3.9; Sodium 134*    Lipid Panel    Component Value Date/Time   CHOL 100 09/04/2012 0515   TRIG 136 09/04/2012 0515   HDL 31* 09/04/2012 0515   CHOLHDL 3.2 09/04/2012 0515   VLDL 27 09/04/2012 0515   LDLCALC 42 09/04/2012 0515     Wt Readings from Last 3 Encounters:  10/26/14 242 lb 9.6 oz (110.043 kg)  02/03/14 251 lb 12.6 oz (114.21 kg)  01/19/14 253 lb 6.4 oz (114.941 kg)     Other studies Reviewed: Additional studies/ records that were reviewed today include: Cath reports, hospital records and previous office notes as well as ECGs.  ASSESSMENT AND PLAN:  1.  Angina pectoris: He has chronic angina and his blood pressure limits medication options. He was on Isordil but only taking it once a day at 20 mg. We'll change him to  Imdur 30 mg daily and see how tolerated. We will refill his sublingual nitroglycerin and give him 100 tablets at a time because he takes it both chronically for GI issues and when necessary for cardiac issues. He is encouraged to look up Ranexa in the computer to see if there is a co-pay assistance card available from the manufacturer. The patient states their daughter can help with this. The concept of collateral circulation was discussed with the patient and he is encouraged to increase his activity as long as his angina resolves with one sublingual nitroglycerin. He is encouraged to follow-up with Dr. Beau Fanny at his next available appointment.  Current medicines are reviewed at length with the patient today.  The patient does not have concerns regarding medicines.  The following changes have been made:  Change Isordil to Imdur and refill nitroglycerin  Labs/ tests ordered today include:   Orders Placed This Encounter  Procedures  . EKG 12-Lead     Disposition:   FU with Dr. Beau Fanny   Signed, Lenoard Aden  10/26/2014 4:36 PM    [facility] [place], [place], [place]  [address] Phone: [phone]; Fax: [phone]

## 2014-10-27 NOTE — Telephone Encounter (Signed)
Spoke with pt daughter Jonni Sanger. Ad her that pt med list has been update. She will help the pt complete the pt assistance form for Ranexa as Armanda Heritage suggested and drop it off to our office. Pt will keep f/u appt with Dr.Smith in Nov

## 2014-10-27 NOTE — Telephone Encounter (Signed)
lmtcb x2 on pt daughter cell and work#

## 2014-10-27 NOTE — Telephone Encounter (Signed)
Follow up     Pt was seen yesterday by Suanne Marker and she was needing correct dosage of Metoprolol Correct dosage is 50mg  1 tablet 2 times a day If you cannot reach [name] on mobile, please call office @ [phone] and have her paged

## 2014-12-08 ENCOUNTER — Other Ambulatory Visit: Payer: Self-pay | Admitting: Interventional Cardiology

## 2014-12-14 ENCOUNTER — Other Ambulatory Visit: Payer: Self-pay | Admitting: Interventional Cardiology

## 2014-12-14 NOTE — Telephone Encounter (Signed)
Pharmacy is requesting metoprolol tartrate 50mg  bid, but office notes indicate that the patient should be taking 25mg  bid. Please advise. Thanks, MI

## 2014-12-15 NOTE — Telephone Encounter (Signed)
Per tel call on 9/1. Pt is taking Metoprolol 50mg  bid

## 2014-12-16 ENCOUNTER — Other Ambulatory Visit: Payer: Self-pay | Admitting: Geriatric Medicine

## 2014-12-16 DIAGNOSIS — F17211 Nicotine dependence, cigarettes, in remission: Secondary | ICD-10-CM

## 2014-12-16 NOTE — Telephone Encounter (Signed)
Update med list and approve 50 mg BID

## 2014-12-26 ENCOUNTER — Other Ambulatory Visit: Payer: Medicare Other

## 2014-12-27 ENCOUNTER — Other Ambulatory Visit: Payer: Medicare Other

## 2014-12-30 ENCOUNTER — Ambulatory Visit
Admission: RE | Admit: 2014-12-30 | Discharge: 2014-12-30 | Disposition: A | Discharge status: Home / Self Care | Payer: Medicare Other | Source: Ambulatory Visit | Attending: Geriatric Medicine | Admitting: Geriatric Medicine

## 2014-12-30 DIAGNOSIS — F17211 Nicotine dependence, cigarettes, in remission: Secondary | ICD-10-CM

## 2015-01-12 ENCOUNTER — Encounter: Payer: Self-pay | Admitting: Interventional Cardiology

## 2015-01-12 ENCOUNTER — Ambulatory Visit (INDEPENDENT_AMBULATORY_CARE_PROVIDER_SITE_OTHER): Payer: Medicare Other | Admitting: Interventional Cardiology

## 2015-01-12 VITALS — BP 110/64 | HR 55 | Ht 68.0 in | Wt 243.0 lb

## 2015-01-12 DIAGNOSIS — E785 Hyperlipidemia, unspecified: Secondary | ICD-10-CM

## 2015-01-12 DIAGNOSIS — I25119 Atherosclerotic heart disease of native coronary artery with unspecified angina pectoris: Secondary | ICD-10-CM | POA: Diagnosis not present

## 2015-01-12 DIAGNOSIS — I209 Angina pectoris, unspecified: Secondary | ICD-10-CM

## 2015-01-12 DIAGNOSIS — I10 Essential (primary) hypertension: Secondary | ICD-10-CM | POA: Diagnosis not present

## 2015-01-12 DIAGNOSIS — I724 Aneurysm of artery of lower extremity: Secondary | ICD-10-CM

## 2015-01-12 DIAGNOSIS — I4510 Unspecified right bundle-branch block: Secondary | ICD-10-CM

## 2015-01-12 MED ORDER — ISOSORBIDE MONONITRATE ER 60 MG PO TB24: 60.0000 mg | ORAL_TABLET | Freq: Every day | ORAL | Status: DC

## 2015-01-12 NOTE — Progress Notes (Signed)
Cardiology Office Note   Date:  01/12/2015   ID:  [name] [name], DOB 1940-07-27, MRN [id number]  PCP:  Mathews Argyle, MD  Cardiologist:  Sinclair Grooms, MD   Chief Complaint  Patient presents with  . Coronary Artery Disease      History of Present Illness: [name] [name] is a 74 y.o. male who presents for a COPD, CAD with chronic total occlusion of RCA, essential hypertension, hyperlipidemia, obesity, obstructive sleep apnea, and diabetes mellitus with C KD.  Each evening when he climbs his stairs, he has angina. He has angina at other times. Angina is responsive to nitroglycerin. He can't do as much as he would like to do because of discomfort. He usually gets complete relief with one tablet.    Past Medical History  Diagnosis Date  . Gastroesophageal reflux   . Hyperlipidemia LDL goal < 70   . Tobacco abuse   . Right bundle branch block   . Peptic ulcer disease 1959  . Coronary artery disease 2009  . Chronic low back pain   . B12 deficiency   . Vertebral compression fracture ([place]) 1970  . Depression   . BPH (benign prostatic hyperplasia)   . Hyperglycemia   . Gynecomastia 06/2009  . Adenomatous polyp 2007  . MI (myocardial infarction) ([place]) 08/1989; 1993  . OSA on CPAP     "have mask; don't wear it" (02/02/2014)  . Arthritis     "my body"  . DDD (degenerative disc disease), cervical   . DDD (degenerative disc disease), lumbosacral   . DDD (degenerative disc disease), thoracic   . Chronic back pain     "all over"  . Skin cancer     "burned/froze off my back" (02/02/2014)    Past Surgical History  Procedure Laterality Date  . Total knee arthroplasty Left 01/2009  . Joint replacement    . Cataract extraction w/ intraocular lens  implant, bilateral Bilateral ~ 2012  . Penile prosthesis implant  1990's  . Nasal septum surgery  1977  . Cardiac catheterization  1991; 1993; 08/2012; 11/2013; 02/02/2014    I've had 6-7 total  . Coronary angioplasty  1990's    . Esophagogastroduodenoscopy (egd) with esophageal dilation  11/2007  . Bypass graft popliteal to popliteal Left 06/2005    Dr Donnetta Hutching  . Bypass graft popliteal to popliteal Right 02/2001    Dr Deon Pilling  . Scrotal surgery  08/1999    pump revision/notes 07/11/2010  . Lower extremity angiogram Left 05/2005    L pop-pop BPG patent  . Left heart catheterization with coronary angiogram N/A 09/03/2012    Procedure: LEFT HEART CATHETERIZATION WITH CORONARY ANGIOGRAM;  Surgeon: Sinclair Grooms, MD;  Location: [facility] CATH LAB;  Service: Cardiovascular;  Laterality: N/A;  . Left heart catheterization with coronary angiogram N/A 12/01/2013    Procedure: LEFT HEART CATHETERIZATION WITH CORONARY ANGIOGRAM;  Surgeon: Sinclair Grooms, MD;  Location: [facility] CATH LAB;  Service: Cardiovascular;  Laterality: N/A;  . Cardiac catheterization  02/02/2014    Procedure: LEFT HEART CATH AND CORONARY ANGIOGRAPHY;  Surgeon: Jettie Booze, MD;  mild disease in LAD/CFX, RCA w/ CTO, unsuccessfull PCI both w/ antegrade and retrograde approaches      Current Outpatient Prescriptions  Medication Sig Dispense Refill  . aspirin EC 81 MG tablet Take 81 mg by mouth daily.    Marland Kitchen buPROPion (WELLBUTRIN XL) 300 MG 24 hr tablet Take 300 mg by mouth daily.    Marland Kitchen  cyanocobalamin (,VITAMIN B-12,) 1000 MCG/ML injection Inject 1,000 mcg into the muscle every 30 (thirty) days. No specific day of the month. Administered by daughter.    Marland Kitchen HYDROcodone-acetaminophen (NORCO/VICODIN) 5-325 MG per tablet Take 1 tablet by mouth every 6 (six) hours as needed for severe pain.     . isosorbide mononitrate (IMDUR) 30 MG 24 hr tablet Take 1 tablet (30 mg total) by mouth daily. 90 tablet 3  . metFORMIN (GLUCOPHAGE-XR) 500 MG 24 hr tablet Take 1 tablet (500 mg total) by mouth daily with breakfast. HOLD for 48 hours. Restart on 02/05/2014  1  . metoprolol (LOPRESSOR) 50 MG tablet TAKE ONE TABLET BY MOUTH TWICE DAILY 60 tablet 1  . nitroGLYCERIN (NITROSTAT) 0.4  MG SL tablet Place 1 tablet (0.4 mg total) under the tongue every 5 (five) minutes as needed for chest pain. 100 tablet 11  . pantoprazole (PROTONIX) 40 MG tablet Take 1 tablet (40 mg total) by mouth daily. 90 tablet 1  . rosuvastatin (CRESTOR) 10 MG tablet Take 10 mg by mouth daily.    . tamsulosin (FLOMAX) 0.4 MG CAPS capsule Take 0.4 mg by mouth daily.      No current facility-administered medications for this visit.    Allergies:   Lipitor; Other; Sulfa antibiotics; and Vytorin    Social History:  The patient  reports that he quit smoking about 2 years ago. His smoking use included Cigarettes. He has a 59 pack-year smoking history. He has quit using smokeless tobacco. His smokeless tobacco use included Chew. He reports that he does not drink alcohol or use illicit drugs.   Family History:  The patient's family history includes Alzheimer's disease (age of onset: 63) in his mother; Diabetes in his daughter; Diabetes (age of onset: 80) in his father; Fibromyalgia (age of onset: 72) in his sister; Hypertension in his son; Obesity in his daughter; Peripheral vascular disease in his father; Pneumonia in his father.    ROS:  Please see the history of present illness.   Otherwise, review of systems are positive for difficulty swallowing, constipation, leg pain, fatigue, decreased hearing, muscle pain, occasional easy bruising and dizziness..   All other systems are reviewed and negative.    PHYSICAL EXAM: VS:  BP 110/64 mmHg  Pulse 55  Ht 5\' 8"  (1.727 m)  Wt 110.224 kg (243 lb)  BMI 36.96 kg/m2 , BMI Body mass index is 36.96 kg/(m^2). GEN: Well nourished, well developed, in no acute distress HEENT: normal Neck: no JVD, carotid bruits, or masses Cardiac: RRR.  There is no murmur, rub, or gallop. There is no edema. Respiratory:  clear to auscultation bilaterally, normal work of breathing. GI: soft, nontender, nondistended, + BS MS: no deformity or atrophy Skin: warm and dry, no rash Neuro:   Strength and sensation are intact Psych: euthymic mood, full affect   EKG:  EKG is not ordered today.    Recent Labs: 02/03/2014: BUN 13; Creatinine, Ser 0.64; Hemoglobin 12.6*; Platelets 159; Potassium 3.9; Sodium 134*    Lipid Panel    Component Value Date/Time   CHOL 100 09/04/2012 0515   TRIG 136 09/04/2012 0515   HDL 31* 09/04/2012 0515   CHOLHDL 3.2 09/04/2012 0515   VLDL 27 09/04/2012 0515   LDLCALC 42 09/04/2012 0515      Wt Readings from Last 3 Encounters:  01/12/15 110.224 kg (243 lb)  10/26/14 110.043 kg (242 lb 9.6 oz)  02/03/14 114.21 kg (251 lb 12.6 oz)  Other studies Reviewed: Additional studies/ records that were reviewed today include: Electronic medical record. The findings include no new data.    ASSESSMENT AND PLAN:  1. Coronary artery disease involving native coronary artery of native heart with angina pectoris (HCC) Class III angina. Failed attempt at RCA CTO recanalization  2. Right bundle branch block None evaluated  3. Hyperlipidemia with target LDL less than 70 On therapy and followed by primary care  4. Essential hypertension Excellent control  5. Bilateral popliteal artery aneurysm (HCC) Asymptomatic    Current medicines are reviewed at length with the patient today.  The patient has the following concerns regarding medicines: Discussed the low dose of isosorbide. No side effects..  The following changes/actions have been instituted:    Increase Imdur to 60 mg per day and further titrated as needed to control angina.  Clinical follow-up in 6 months  Return call to [place] in one month to inform if there is improvement in exertional ability on optimized Imdur. If not we should further increase the dose. We also have the option of increasing Ranexa.  Labs/ tests ordered today include:  No orders of the defined types were placed in this encounter.     Disposition:   FU with HS in 6 months  Signed, Sinclair Grooms, MD   01/12/2015 8:16 AM    [facility] [address], [place], [place]  [address] Phone: [phone]; Fax: [phone]

## 2015-01-12 NOTE — Patient Instructions (Signed)
Medication Instructions:  Your physician has recommended you make the following change in your medication:  INCREASE Imdur to 60mg  daily   Labwork: None ordered  Testing/Procedures: None ordered  Follow-Up: Your physician wants you to follow-up in: 6 months with Dr.Smith You will receive a reminder letter in the mail two months in advance. If you don't receive a letter, please call our office to schedule the follow-up appointment.   Any Other Special Instructions Will Be Listed Below (If Applicable). Call the office in 4 weeks with an update of your symptoms     If you need a refill on your cardiac medications before your next appointment, please call your pharmacy.

## 2015-01-25 ENCOUNTER — Telehealth: Payer: Self-pay | Admitting: Interventional Cardiology

## 2015-01-25 NOTE — Telephone Encounter (Signed)
Called pt to verify how he is using Nitro. Nitro should be prn for CP. lmtcb

## 2015-01-25 NOTE — Telephone Encounter (Signed)
Spoke with pt who sts that he is not taking Nitro Daily after every meal or every 15 min prn. Pt sts that he takes Nitro prn for CP. Pt sts that he does not use Nitro daily, only if he is hurting. Pt verbally repeated back to me instruction on how and when to use Nitro.  Spoke with Opal Sidles the pharmacist at [facility], she sts hat pt last refilled Nitro on 11/14 and that the pt did indicate to her that he is using Nitro daily after each meal. Adv her of by conversation with pt above. Adv her pt Nitro should be dispensed as it is written.

## 2015-01-25 NOTE — Telephone Encounter (Signed)
Follow up ° ° ° ° ° °Returning a call to the nurse °

## 2015-01-25 NOTE — Telephone Encounter (Signed)
[facility] calling [phone]-they have rx for pt's Nitro to take 1 tab under tongue as needed for chest pain, pt states Dr. [name] told him to take 1 tab after every meal and every 15 minutes as needed for chest pain-[facility] needs clarification as pt's insurance will not  cover so many pills-pls advise

## 2015-02-04 ENCOUNTER — Other Ambulatory Visit: Payer: Self-pay | Admitting: Interventional Cardiology

## 2015-02-24 ENCOUNTER — Other Ambulatory Visit: Payer: Self-pay | Admitting: Interventional Cardiology

## 2015-05-15 DIAGNOSIS — M65341 Trigger finger, right ring finger: Secondary | ICD-10-CM | POA: Insufficient documentation

## 2015-05-15 DIAGNOSIS — M1811 Unilateral primary osteoarthritis of first carpometacarpal joint, right hand: Secondary | ICD-10-CM | POA: Insufficient documentation

## 2015-05-15 DIAGNOSIS — M79641 Pain in right hand: Secondary | ICD-10-CM | POA: Insufficient documentation

## 2015-05-15 DIAGNOSIS — M19042 Primary osteoarthritis, left hand: Secondary | ICD-10-CM | POA: Insufficient documentation

## 2015-06-16 ENCOUNTER — Encounter (HOSPITAL_COMMUNITY): Payer: Self-pay | Admitting: *Deleted

## 2015-06-16 ENCOUNTER — Emergency Department (HOSPITAL_COMMUNITY): Payer: No Typology Code available for payment source

## 2015-06-16 ENCOUNTER — Observation Stay (HOSPITAL_COMMUNITY)
Admission: EM | Admit: 2015-06-16 | Discharge: 2015-06-17 | Disposition: A | Discharge status: Home / Self Care | Payer: No Typology Code available for payment source | Attending: Internal Medicine | Admitting: Internal Medicine

## 2015-06-16 DIAGNOSIS — K219 Gastro-esophageal reflux disease without esophagitis: Secondary | ICD-10-CM | POA: Diagnosis present

## 2015-06-16 DIAGNOSIS — N400 Enlarged prostate without lower urinary tract symptoms: Secondary | ICD-10-CM | POA: Diagnosis not present

## 2015-06-16 DIAGNOSIS — Z87891 Personal history of nicotine dependence: Secondary | ICD-10-CM | POA: Diagnosis not present

## 2015-06-16 DIAGNOSIS — I25119 Atherosclerotic heart disease of native coronary artery with unspecified angina pectoris: Secondary | ICD-10-CM

## 2015-06-16 DIAGNOSIS — Z7984 Long term (current) use of oral hypoglycemic drugs: Secondary | ICD-10-CM | POA: Insufficient documentation

## 2015-06-16 DIAGNOSIS — E538 Deficiency of other specified B group vitamins: Secondary | ICD-10-CM | POA: Insufficient documentation

## 2015-06-16 DIAGNOSIS — I25110 Atherosclerotic heart disease of native coronary artery with unstable angina pectoris: Secondary | ICD-10-CM | POA: Diagnosis present

## 2015-06-16 DIAGNOSIS — Z7982 Long term (current) use of aspirin: Secondary | ICD-10-CM | POA: Insufficient documentation

## 2015-06-16 DIAGNOSIS — Z79899 Other long term (current) drug therapy: Secondary | ICD-10-CM | POA: Diagnosis not present

## 2015-06-16 DIAGNOSIS — W2211XA Striking against or struck by driver side automobile airbag, initial encounter: Secondary | ICD-10-CM | POA: Insufficient documentation

## 2015-06-16 DIAGNOSIS — S27892A Contusion of other specified intrathoracic organs, initial encounter: Secondary | ICD-10-CM | POA: Diagnosis not present

## 2015-06-16 DIAGNOSIS — Z95828 Presence of other vascular implants and grafts: Secondary | ICD-10-CM | POA: Insufficient documentation

## 2015-06-16 DIAGNOSIS — E785 Hyperlipidemia, unspecified: Secondary | ICD-10-CM | POA: Diagnosis not present

## 2015-06-16 DIAGNOSIS — I252 Old myocardial infarction: Secondary | ICD-10-CM | POA: Insufficient documentation

## 2015-06-16 DIAGNOSIS — Z96652 Presence of left artificial knee joint: Secondary | ICD-10-CM | POA: Insufficient documentation

## 2015-06-16 DIAGNOSIS — K224 Dyskinesia of esophagus: Secondary | ICD-10-CM | POA: Insufficient documentation

## 2015-06-16 DIAGNOSIS — F329 Major depressive disorder, single episode, unspecified: Secondary | ICD-10-CM | POA: Diagnosis not present

## 2015-06-16 DIAGNOSIS — I2510 Atherosclerotic heart disease of native coronary artery without angina pectoris: Secondary | ICD-10-CM | POA: Insufficient documentation

## 2015-06-16 DIAGNOSIS — S2220XA Unspecified fracture of sternum, initial encounter for closed fracture: Secondary | ICD-10-CM

## 2015-06-16 DIAGNOSIS — I10 Essential (primary) hypertension: Secondary | ICD-10-CM | POA: Diagnosis not present

## 2015-06-16 DIAGNOSIS — G4733 Obstructive sleep apnea (adult) (pediatric): Secondary | ICD-10-CM | POA: Insufficient documentation

## 2015-06-16 DIAGNOSIS — Z85828 Personal history of other malignant neoplasm of skin: Secondary | ICD-10-CM | POA: Insufficient documentation

## 2015-06-16 DIAGNOSIS — S2222XA Fracture of body of sternum, initial encounter for closed fracture: Principal | ICD-10-CM | POA: Insufficient documentation

## 2015-06-16 DIAGNOSIS — R0789 Other chest pain: Secondary | ICD-10-CM | POA: Diagnosis present

## 2015-06-16 DIAGNOSIS — T148XXA Other injury of unspecified body region, initial encounter: Secondary | ICD-10-CM

## 2015-06-16 LAB — BASIC METABOLIC PANEL
ANION GAP: 8 (ref 5–15)
BUN: 16 mg/dL (ref 6–20)
CHLORIDE: 105 mmol/L (ref 101–111)
CO2: 25 mmol/L (ref 22–32)
Calcium: 9.3 mg/dL (ref 8.9–10.3)
Creatinine, Ser: 0.76 mg/dL (ref 0.61–1.24)
GFR calc Af Amer: >60 mL/min (ref >60)
Glucose, Bld: 146 mg/dL — ABNORMAL HIGH (ref 65–99)
POTASSIUM: 3.9 mmol/L (ref 3.5–5.1)
SODIUM: 138 mmol/L (ref 135–145)

## 2015-06-16 LAB — CBC WITH DIFFERENTIAL/PLATELET
BASOS ABS: 0 10*3/uL (ref 0.0–0.1)
Basophils Relative: 0 %
EOS ABS: 0.2 10*3/uL (ref 0.0–0.7)
Eosinophils Relative: 3 %
HCT: 42.7 % (ref 39.0–52.0)
HEMOGLOBIN: 14.2 g/dL (ref 13.0–17.0)
LYMPHS ABS: 1.5 10*3/uL (ref 0.7–4.0)
Lymphocytes Relative: 19 %
MCH: 30.5 pg (ref 26.0–34.0)
MCHC: 33.3 g/dL (ref 30.0–36.0)
MCV: 91.8 fL (ref 78.0–100.0)
Monocytes Absolute: 0.7 10*3/uL (ref 0.1–1.0)
Monocytes Relative: 8 %
NEUTROS PCT: 70 %
Neutro Abs: 5.5 10*3/uL (ref 1.7–7.7)
PLATELETS: 177 10*3/uL (ref 150–400)
RBC: 4.65 MIL/uL (ref 4.22–5.81)
RDW: 13.8 % (ref 11.5–15.5)
WBC: 7.9 10*3/uL (ref 4.0–10.5)

## 2015-06-16 LAB — TROPONIN I

## 2015-06-16 MED ORDER — SODIUM CHLORIDE 0.9% FLUSH: 3.0000 mL | Freq: Two times a day (BID) | INTRAVENOUS | Status: DC

## 2015-06-16 MED ORDER — ISOSORBIDE MONONITRATE ER 60 MG PO TB24: 60.0000 mg | ORAL_TABLET | Freq: Every day | ORAL | Status: DC

## 2015-06-16 MED ORDER — PANTOPRAZOLE SODIUM 40 MG PO TBEC: 40.0000 mg | DELAYED_RELEASE_TABLET | Freq: Every day | ORAL | Status: DC

## 2015-06-16 MED ORDER — CYANOCOBALAMIN 1000 MCG/ML IJ SOLN: 1000.0000 ug | INTRAMUSCULAR | Status: DC

## 2015-06-16 MED ORDER — METFORMIN HCL ER 500 MG PO TB24: 500.0000 mg | ORAL_TABLET | Freq: Every day | ORAL | Status: DC

## 2015-06-16 MED ORDER — BUPROPION HCL ER (XL) 300 MG PO TB24
300.0000 mg | ORAL_TABLET | Freq: Every day | ORAL | Status: DC
  Filled 2015-06-16: qty 1

## 2015-06-16 MED ORDER — DOCUSATE SODIUM 100 MG PO CAPS
100.0000 mg | ORAL_CAPSULE | Freq: Two times a day (BID) | ORAL | Status: DC
  Administered 2015-06-16: 100 mg via ORAL
  Filled 2015-06-16: qty 1

## 2015-06-16 MED ORDER — ROSUVASTATIN CALCIUM 10 MG PO TABS: 10.0000 mg | ORAL_TABLET | Freq: Every day | ORAL | Status: DC

## 2015-06-16 MED ORDER — NITROGLYCERIN 0.4 MG SL SUBL: 0.4000 mg | SUBLINGUAL_TABLET | Freq: Three times a day (TID) | SUBLINGUAL | Status: DC

## 2015-06-16 MED ORDER — NITROGLYCERIN 0.4 MG SL SUBL
0.4000 mg | SUBLINGUAL_TABLET | SUBLINGUAL | Status: DC | PRN
Start: 2015-06-16 — End: 2015-06-17

## 2015-06-16 MED ORDER — MORPHINE SULFATE (PF) 4 MG/ML IV SOLN
4.0000 mg | Freq: Once | INTRAVENOUS | Status: AC
  Administered 2015-06-16: 4 mg via INTRAVENOUS
  Filled 2015-06-16: qty 1

## 2015-06-16 MED ORDER — METOPROLOL TARTRATE 50 MG PO TABS
50.0000 mg | ORAL_TABLET | Freq: Two times a day (BID) | ORAL | Status: DC
  Administered 2015-06-16: 50 mg via ORAL
  Filled 2015-06-16: qty 1

## 2015-06-16 MED ORDER — TAMSULOSIN HCL 0.4 MG PO CAPS: 0.4000 mg | ORAL_CAPSULE | Freq: Every day | ORAL | Status: DC

## 2015-06-16 MED ORDER — ONDANSETRON HCL 4 MG/2ML IJ SOLN
4.0000 mg | Freq: Four times a day (QID) | INTRAMUSCULAR | Status: DC | PRN
Start: 2015-06-16 — End: 2015-06-17

## 2015-06-16 MED ORDER — OXYCODONE HCL 5 MG PO TABS
5.0000 mg | ORAL_TABLET | ORAL | Status: DC | PRN
  Administered 2015-06-16 – 2015-06-17 (×3): 5 mg via ORAL
  Filled 2015-06-16 (×3): qty 1

## 2015-06-16 MED ORDER — ONDANSETRON HCL 4 MG PO TABS: 4.0000 mg | ORAL_TABLET | Freq: Four times a day (QID) | ORAL | Status: DC | PRN

## 2015-06-16 MED ORDER — MORPHINE SULFATE (PF) 2 MG/ML IV SOLN
2.0000 mg | INTRAVENOUS | Status: DC | PRN
  Administered 2015-06-16: 2 mg via INTRAVENOUS
  Filled 2015-06-16: qty 1
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
## 2015-06-16 NOTE — ED Notes (Signed)
CARELINK Called at 17:35.
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
## 2015-06-16 NOTE — ED Notes (Signed)
Dr Wyatt at bedside.  

## 2015-06-17 DIAGNOSIS — S27899A Unspecified injury of other specified intrathoracic organs, initial encounter: Secondary | ICD-10-CM | POA: Diagnosis not present

## 2015-06-17 DIAGNOSIS — S2222XA Fracture of body of sternum, initial encounter for closed fracture: Secondary | ICD-10-CM | POA: Diagnosis not present

## 2015-06-17 LAB — CBC
HEMATOCRIT: 43.2 % (ref 39.0–52.0)
Hemoglobin: 14.1 g/dL (ref 13.0–17.0)
MCH: 30.4 pg (ref 26.0–34.0)
MCHC: 32.6 g/dL (ref 30.0–36.0)
MCV: 93.1 fL (ref 78.0–100.0)
Platelets: 151 10*3/uL (ref 150–400)
RBC: 4.64 MIL/uL (ref 4.22–5.81)
RDW: 13.7 % (ref 11.5–15.5)
WBC: 7.1 10*3/uL (ref 4.0–10.5)

## 2015-06-17 LAB — GLUCOSE, CAPILLARY
GLUCOSE-CAPILLARY: 142 mg/dL — AB (ref 65–99)
GLUCOSE-CAPILLARY: 134 mg/dL — AB (ref 65–99)

## 2015-06-17 MED ORDER — DOCUSATE SODIUM 100 MG PO CAPS: 100.0000 mg | ORAL_CAPSULE | Freq: Two times a day (BID) | ORAL | Status: DC

## 2015-06-17 MED ORDER — ASPIRIN EC 81 MG PO TBEC: 81.0000 mg | DELAYED_RELEASE_TABLET | Freq: Every day | ORAL | Status: AC

## 2015-06-17 MED ORDER — HYDROCODONE-ACETAMINOPHEN 5-325 MG PO TABS: 1.0000 | ORAL_TABLET | Freq: Four times a day (QID) | ORAL | Status: DC | PRN

## 2015-06-17 MED ORDER — ALBUTEROL SULFATE HFA 108 (90 BASE) MCG/ACT IN AERS: 2.0000 | INHALATION_SPRAY | Freq: Four times a day (QID) | RESPIRATORY_TRACT | Status: DC | PRN

## 2015-06-17 MED ORDER — ALBUTEROL SULFATE (2.5 MG/3ML) 0.083% IN NEBU
2.5000 mg | INHALATION_SOLUTION | Freq: Once | RESPIRATORY_TRACT | Status: AC
  Administered 2015-06-17: 2.5 mg via RESPIRATORY_TRACT
  Filled 2015-06-17: qty 3
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
## 2015-06-17 NOTE — Progress Notes (Signed)
Discharged to home with family office visits in place teaching done  
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
## 2015-07-10 ENCOUNTER — Encounter: Payer: Self-pay | Admitting: Interventional Cardiology

## 2015-07-10 ENCOUNTER — Telehealth: Payer: Self-pay | Admitting: Interventional Cardiology

## 2015-07-10 ENCOUNTER — Ambulatory Visit (INDEPENDENT_AMBULATORY_CARE_PROVIDER_SITE_OTHER): Payer: Medicare Other | Admitting: Interventional Cardiology

## 2015-07-10 VITALS — BP 100/58 | HR 66 | Ht 68.5 in | Wt 235.8 lb

## 2015-07-10 DIAGNOSIS — I25119 Atherosclerotic heart disease of native coronary artery with unspecified angina pectoris: Secondary | ICD-10-CM | POA: Diagnosis not present

## 2015-07-10 DIAGNOSIS — I10 Essential (primary) hypertension: Secondary | ICD-10-CM | POA: Diagnosis not present

## 2015-07-10 DIAGNOSIS — E785 Hyperlipidemia, unspecified: Secondary | ICD-10-CM

## 2015-07-10 DIAGNOSIS — R0789 Other chest pain: Secondary | ICD-10-CM | POA: Diagnosis not present

## 2015-07-10 DIAGNOSIS — I4510 Unspecified right bundle-branch block: Secondary | ICD-10-CM

## 2015-07-10 DIAGNOSIS — I724 Aneurysm of artery of lower extremity: Secondary | ICD-10-CM

## 2015-07-10 NOTE — Telephone Encounter (Signed)
Noted with update Dr.Smith
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
## 2015-07-10 NOTE — Patient Instructions (Signed)
Medication Instructions:  Your physician recommends that you continue on your current medications as directed. Please refer to the Current Medication list given to you today.   Labwork: -None  Testing/Procedures: -None  Follow-Up: Your physician wants you to follow-up in: 1 year with Dr. [name].  You will receive a reminder letter in the mail two months in advance. If you don't receive a letter, please call our office to schedule the follow-up appointment.   Any Other Special Instructions Will Be Listed Below (If Applicable).  Call our office  back in 2-3 week to let Dr. [name]  know about your chest discomfort.   If you need a refill on your cardiac medications before your next appointment, please call your pharmacy.

## 2015-07-19 NOTE — Addendum Note (Signed)
Addended by: Freada Bergeron on: 07/19/2015 05:07 PM   Modules accepted: Orders

## 2015-09-22 ENCOUNTER — Other Ambulatory Visit: Payer: Self-pay | Admitting: Gastroenterology

## 2015-09-25 ENCOUNTER — Encounter (HOSPITAL_COMMUNITY): Payer: Self-pay | Admitting: *Deleted

## 2015-09-25 NOTE — Progress Notes (Signed)
Anesthesia Chart Review:  Pt is a 75 year old male scheduled for EGD on 09/27/2015 with Clarene Essex, MD.   Pt is a same day work up.   Cardiologist is [name], MD.   PMH includes:  CAD, MI, DM, hyperlipidemia, OSA, GERD. Current smoker. BMI 35  Medications include: albuterol, ASA, imdur, metformin, metoprolol, protonix, crestor  Labs will be obtained DOS.   Chest x-ray 06/16/15 reviewed. No edema or consolidation  EKG 07/10/15: NSR. RBBB.   Cardiac cath 12/01/13 (for abnormal stress test, chest pain):  1. Chronic total occlusion of the RCA which is well collateralized from the left circumflex and the LAD cell perforators. 2. Widely patent RCA and circumflex coronary arteries 3. Diastolic heart failure with LVEDP 28 mmHg 4. Class III angina pectoris  Pt reported to PAT RN he has chest pain and that Dr. [name] is aware (angina documented in 01/12/15 note and on 2015 cath) and that it is not different than his usual.   If no changes, I anticipate pt can proceed with surgery as scheduled.   Willeen Cass, FNP-BC [facility]/Anesthesiology Phone: [phone] 09/25/2015 1:41 PM
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
## 2015-09-28 ENCOUNTER — Ambulatory Visit (HOSPITAL_COMMUNITY): Payer: Medicare Other | Admitting: Emergency Medicine

## 2015-09-28 ENCOUNTER — Encounter (HOSPITAL_COMMUNITY): Payer: Self-pay | Admitting: *Deleted

## 2015-09-28 ENCOUNTER — Encounter (HOSPITAL_COMMUNITY)
Admission: RE | Disposition: A | Discharge status: Home / Self Care | Payer: Self-pay | Source: Ambulatory Visit | Attending: Gastroenterology

## 2015-09-28 ENCOUNTER — Ambulatory Visit (HOSPITAL_COMMUNITY)
Admission: RE | Admit: 2015-09-28 | Discharge: 2015-09-28 | Disposition: A | Discharge status: Home / Self Care | Payer: Medicare Other | Source: Ambulatory Visit | Attending: Gastroenterology | Admitting: Gastroenterology

## 2015-09-28 ENCOUNTER — Ambulatory Visit (HOSPITAL_COMMUNITY): Payer: Medicare Other

## 2015-09-28 DIAGNOSIS — G4730 Sleep apnea, unspecified: Secondary | ICD-10-CM | POA: Insufficient documentation

## 2015-09-28 DIAGNOSIS — K259 Gastric ulcer, unspecified as acute or chronic, without hemorrhage or perforation: Secondary | ICD-10-CM | POA: Diagnosis not present

## 2015-09-28 DIAGNOSIS — E1151 Type 2 diabetes mellitus with diabetic peripheral angiopathy without gangrene: Secondary | ICD-10-CM | POA: Diagnosis not present

## 2015-09-28 DIAGNOSIS — I252 Old myocardial infarction: Secondary | ICD-10-CM | POA: Insufficient documentation

## 2015-09-28 DIAGNOSIS — R1310 Dysphagia, unspecified: Secondary | ICD-10-CM | POA: Diagnosis present

## 2015-09-28 DIAGNOSIS — K224 Dyskinesia of esophagus: Secondary | ICD-10-CM | POA: Insufficient documentation

## 2015-09-28 DIAGNOSIS — M1990 Unspecified osteoarthritis, unspecified site: Secondary | ICD-10-CM | POA: Insufficient documentation

## 2015-09-28 DIAGNOSIS — K222 Esophageal obstruction: Secondary | ICD-10-CM | POA: Diagnosis not present

## 2015-09-28 DIAGNOSIS — K219 Gastro-esophageal reflux disease without esophagitis: Secondary | ICD-10-CM | POA: Insufficient documentation

## 2015-09-28 DIAGNOSIS — I2510 Atherosclerotic heart disease of native coronary artery without angina pectoris: Secondary | ICD-10-CM | POA: Insufficient documentation

## 2015-09-28 HISTORY — PX: ESOPHAGOGASTRODUODENOSCOPY (EGD) WITH PROPOFOL: SHX5813

## 2015-09-28 HISTORY — DX: Type 2 diabetes mellitus without complications: E11.9

## 2015-09-28 HISTORY — PX: SAVORY DILATION: SHX5439

## 2015-09-28 HISTORY — DX: Reserved for inherently not codable concepts without codable children: IMO0001

## 2015-09-28 LAB — GLUCOSE, CAPILLARY: GLUCOSE-CAPILLARY: 139 mg/dL — AB (ref 65–99)

## 2015-09-28 SURGERY — ESOPHAGOGASTRODUODENOSCOPY (EGD) WITH PROPOFOL
Anesthesia: Monitor Anesthesia Care

## 2015-09-28 MED ORDER — LIDOCAINE HCL (CARDIAC) 20 MG/ML IV SOLN
INTRAVENOUS | Status: DC | PRN
  Administered 2015-09-28: 100 mg via INTRATRACHEAL

## 2015-09-28 MED ORDER — PROPOFOL 500 MG/50ML IV EMUL
INTRAVENOUS | Status: DC | PRN
  Administered 2015-09-28: 80 ug/kg/min via INTRAVENOUS

## 2015-09-28 MED ORDER — NITROGLYCERIN 0.4 MG SL SUBL
SUBLINGUAL_TABLET | SUBLINGUAL | Status: AC
  Filled 2015-09-28: qty 1

## 2015-09-28 MED ORDER — PROPOFOL 10 MG/ML IV BOLUS
INTRAVENOUS | Status: DC | PRN
  Administered 2015-09-28 (×5): 10 mg via INTRAVENOUS

## 2015-09-28 MED ORDER — LACTATED RINGERS IV SOLN
INTRAVENOUS | Status: DC
  Administered 2015-09-28: 08:00:00 via INTRAVENOUS

## 2015-09-28 MED ORDER — SODIUM CHLORIDE 0.9 % IV SOLN: INTRAVENOUS | Status: DC

## 2015-09-28 MED ORDER — NITROGLYCERIN IN D5W 200-5 MCG/ML-% IV SOLN
0.0000 ug/min | INTRAVENOUS | Status: DC
  Filled 2015-09-28: qty 250

## 2015-09-28 MED ORDER — BUTAMBEN-TETRACAINE-BENZOCAINE 2-2-14 % EX AERO
INHALATION_SPRAY | CUTANEOUS | Status: DC | PRN
  Administered 2015-09-28: 2 via TOPICAL

## 2015-09-28 NOTE — Anesthesia Preprocedure Evaluation (Signed)
Anesthesia Evaluation  Patient identified by MRN, date of birth, ID band Patient awake    Airway Mallampati: II  TM Distance: >3 FB Neck ROM: Full    Dental  (+) Teeth Intact   Pulmonary shortness of breath, sleep apnea , Current Smoker,    breath sounds clear to auscultation       Cardiovascular + angina + CAD, + Past MI and + Peripheral Vascular Disease  + dysrhythmias  Rhythm:Regular Rate:Normal     Neuro/Psych    GI/Hepatic GERD  Controlled,  Endo/Other  diabetes, Well Controlled  Renal/GU      Musculoskeletal  (+) Arthritis ,   Abdominal (+) + obese,   Peds  Hematology   Anesthesia Other Findings   Reproductive/Obstetrics                             Anesthesia Physical Anesthesia Plan  ASA: III  Anesthesia Plan: MAC   Post-op Pain Management:    Induction: Intravenous  Airway Management Planned: Natural Airway and Nasal Cannula  Additional Equipment:   Intra-op Plan:   Post-operative Plan:   Informed Consent: I have reviewed the patients History and Physical, chart, labs and discussed the procedure including the risks, benefits and alternatives for the proposed anesthesia with the patient or authorized representative who has indicated his/her understanding and acceptance.   Dental advisory given  Plan Discussed with: CRNA  Anesthesia Plan Comments:         Anesthesia Quick Evaluation

## 2015-09-28 NOTE — Transfer of Care (Signed)
Immediate Anesthesia Transfer of Care Note  Patient: [name] [name]  Procedure(s) Performed: Procedure(s) with comments: ESOPHAGOGASTRODUODENOSCOPY (EGD) WITH PROPOFOL (N/A) SAVORY DILATION (N/A) - have balloons available  Patient Location: Endoscopy Unit  Anesthesia Type:MAC  Level of Consciousness: awake, alert , oriented and patient cooperative  Airway & Oxygen Therapy: Patient Spontanous Breathing and Patient connected to nasal cannula oxygen  Post-op Assessment: Report given to RN and Post -op Vital signs reviewed and stable  Post vital signs: Reviewed and stable  Last Vitals:  Vitals:   09/28/15 0747 09/28/15 0922  BP: (!) 178/76 131/62  Pulse: 66 66  Resp: 18 14  Temp: 36.7 C     Last Pain:  Vitals:   09/28/15 0747  TempSrc: Oral         Complications: No apparent anesthesia complications

## 2015-09-28 NOTE — Progress Notes (Signed)
[name] [name] 8:35 AM  Subjective: Patient without any new complaints since we saw him last week in the office  Objective: Vital signs stable afebrile no acute distress exam please see preassessment evaluation  Assessment: Dysphasia  Plan: Okay to proceed with endoscopy and probable dilation with anesthesia assistance  [facility]  Pager [phone] After 5PM or if no answer call [phone]

## 2015-09-28 NOTE — Discharge Instructions (Signed)
Call if question or problem or if symptoms getting worse otherwise follow-up in one month  Esophagogastroduodenoscopy, Care After Refer to this sheet in the next few weeks. These instructions provide you with information about caring for yourself after your procedure. Your health care provider may also give you more specific instructions. Your treatment has been planned according to current medical practices, but problems sometimes occur. Call your health care provider if you have any problems or questions after your procedure. WHAT TO EXPECT AFTER THE PROCEDURE After your procedure, it is typical to feel:  Soreness in your throat.  Pain with swallowing.  Sick to your stomach (nauseous).  Bloated.  Dizzy.  Fatigued. HOME CARE INSTRUCTIONS  Do not eat or drink anything until the numbing medicine (local anesthetic) has worn off and your gag reflex has returned. You will know that the local anesthetic has worn off when you can swallow comfortably.  Do not drive or operate machinery until directed by your health care provider.  Take medicines only as directed by your health care provider. SEEK MEDICAL CARE IF:   You cannot stop coughing.  You are not urinating at all or less than usual. SEEK IMMEDIATE MEDICAL CARE IF:  You have difficulty swallowing.  You cannot eat or drink.  You have worsening throat or chest pain.  You have dizziness or lightheadedness or you faint.  You have nausea or vomiting.  You have chills.  You have a fever.  You have severe abdominal pain.  You have black, tarry, or bloody stools.   This information is not intended to replace advice given to you by your health care provider. Make sure you discuss any questions you have with your health care provider.   Document Released: 01/29/2012 Document Revised: 03/04/2014 Document Reviewed: 01/29/2012 Elsevier Interactive Patient Education 2016 Elsevier Inc[employer]   Esophageal Dilatation Esophageal  dilatation is a procedure to open a blocked or narrowed part of the esophagus. The esophagus is the long tube in your throat that carries food and liquid from your mouth to your stomach. The procedure is also called esophageal dilation.  You may need this procedure if you have a buildup of scar tissue in your esophagus that makes it difficult, painful, or even impossible to swallow. This can be caused by gastroesophageal reflux disease (GERD). In rare cases, people need this procedure because they have cancer of the esophagus or a problem with the way food moves through the esophagus. Sometimes you may need to have another dilatation to enlarge the opening of the esophagus gradually. LET [facility] CARE PROVIDER KNOW ABOUT:   Any allergies you have.  All medicines you are taking, including vitamins, herbs, eye drops, creams, and over-the-counter medicines.  Previous problems you or members of your family have had with the use of anesthetics.  Any blood disorders you have.  Previous surgeries you have had.  Medical conditions you have.  Any antibiotic medicines you are required to take before dental procedures. RISKS AND COMPLICATIONS Generally, this is a safe procedure. However, problems can occur and include:  Bleeding from a tear in the lining of the esophagus.  A hole (perforation) in the esophagus. BEFORE THE PROCEDURE  Do not eat or drink anything after midnight on the night before the procedure or as directed by your health care provider.  Ask your health care provider about changing or stopping your regular medicines. This is especially important if you are taking diabetes medicines or blood thinners.  Plan to have someone  take you home after the procedure. PROCEDURE   You will be given a medicine that makes you relaxed and sleepy (sedative).  A medicine may be sprayed or gargled to numb the back of the throat.  Your health care provider can use various instruments to do  an esophageal dilatation. During the procedure, the instrument used will be placed in your mouth and passed down into your esophagus. Options include:  Simple dilators. This instrument is carefully placed in the esophagus to stretch it.  Guided wire bougies. In this method, a flexible tube (endoscope) is used to insert a wire into the esophagus. The dilator is passed over this wire to enlarge the esophagus. Then the wire is removed.  Balloon dilators. An endoscope with a small balloon at the end is passed down into the esophagus. Inflating the balloon gently stretches the esophagus and opens it up. AFTER THE PROCEDURE  Your blood pressure, heart rate, breathing rate, and blood oxygen level will be monitored often until the medicines you were given have worn off.  Your throat may feel slightly sore and will probably still feel numb. This will improve slowly over time.  You will not be allowed to eat or drink until the throat numbness has resolved.  If this is a same-day procedure, you may be allowed to go home once you have been able to drink, urinate, and sit on the edge of the bed without nausea or dizziness.  If this is a same-day procedure, you should have a friend or family member with you for the next 24 hours after the procedure.   This information is not intended to replace advice given to you by your health care provider. Make sure you discuss any questions you have with your health care provider.   Document Released: 04/04/2005 Document Revised: 03/04/2014 Document Reviewed: 06/23/2013 Elsevier Interactive Patient Education [employer].

## 2015-09-28 NOTE — Anesthesia Postprocedure Evaluation (Signed)
Anesthesia Post Note  Patient: VENIAMIN WEIGMAN  Procedure(s) Performed: Procedure(s) (LRB): ESOPHAGOGASTRODUODENOSCOPY (EGD) WITH PROPOFOL (N/A) SAVORY DILATION (N/A)  Patient location during evaluation: Endoscopy Anesthesia Type: MAC Level of consciousness: awake and alert Pain management: pain level controlled Vital Signs Assessment: post-procedure vital signs reviewed and stable Respiratory status: spontaneous breathing, nonlabored ventilation, respiratory function stable and patient connected to nasal cannula oxygen Cardiovascular status: stable and blood pressure returned to baseline Anesthetic complications: no    Last Vitals:  Vitals:   09/28/15 0922 09/28/15 0933  BP: 131/62 (!) 148/73  Pulse: 66 67  Resp: 14 (!) 21  Temp:      Last Pain:  Vitals:   09/28/15 0747  TempSrc: Oral                 Kawan Valladolid,JAMES TERRILL

## 2015-09-28 NOTE — Op Note (Signed)
[facility] ([facility]) Patient Name: [name] [name] Procedure Date : 09/28/2015 MRN: [id number] Attending MD: Clarene Essex , MD Date of Birth: 1940/12/07 CSN: [id number] Age: 75 Admit Type: Inpatient Procedure:                Upper GI endoscopy Indications:              Dysphagia Providers:                Clarene Essex, MD, Sarah Monday RN, RN, William Dalton, Technician Referring MD:              Medicines:                Propofol total dose 264 mg IV, Cetacaine spray 100                            mg IV lidocaine Complications:            No immediate complications. Estimated Blood Loss:     Estimated blood loss: none. Estimated blood loss:                            none. Procedure:                Pre-Anesthesia Assessment:                           - Prior to the procedure, a History and Physical                            was performed, and patient medications and                            allergies were reviewed. The patient's tolerance of                            previous anesthesia was also reviewed. The risks                            and benefits of the procedure and the sedation                            options and risks were discussed with the patient.                            All questions were answered, and informed consent                            was obtained. Prior Anticoagulants: The patient has                            taken aspirin, last dose was 2 days prior to  procedure. ASA Grade Assessment: II - A patient                            with mild systemic disease. After reviewing the                            risks and benefits, the patient was deemed in                            satisfactory condition to undergo the procedure.                           After obtaining informed consent, the endoscope was                            passed under direct vision. Throughout the   procedure, the patient's blood pressure, pulse, and                            oxygen saturations were monitored continuously. The                            [id number]) scope was introduced through the                            mouth, and advanced to the second part of duodenum.                            The upper GI endoscopy was accomplished without                            difficulty. The patient tolerated the procedure                            well. Findings:      One mild benign-appearing, intrinsic stenosis was found. And was       traversed. A guidewire was placed under fluoroscopic guidance and the       scope was withdrawn. Dilation was performed with a Savary dilator with       no resistance at 16 mm. Estimated blood loss: none.      One tiny non-bleeding cratered gastric ulcer with no stigmata of       bleeding was found in the gastric antrum.      The duodenal bulb, first portion of the duodenum and second portion of       the duodenum were normal.      Abnormal motility was noted in the distal esophagus. There is spasticity       of the esophageal body. The distal esophagus/lower esophageal sphincter       is spastic, but gives up passage to the endoscope.      The exam was otherwise without abnormality. Impression:               - Benign-appearing esophageal stenosis. Dilated.                           -  Non-bleeding gastric ulcer with no stigmata of                            bleeding.                           - Normal duodenal bulb, first portion of the                            duodenum and second portion of the duodenum.                           - Abnormal esophageal motility, consistent with                            presbyesophagus.                           - The examination was otherwise normal.                           - No specimens collected. Moderate Sedation:      moderate sedation-none Recommendation:           - Patient has a contact  number available for                            emergencies. The signs and symptoms of potential                            delayed complications were discussed with the                            patient. Return to normal activities tomorrow.                            Written discharge instructions were provided to the                            patient.                           - Soft diet today.                           - Continue present medications. continue pump                            inhibitors and nitroglycerin before eating                           - Return to GI clinic in 4 weeks.                           - Telephone GI clinic if symptomatic PRN. consider  a balloon dilatation if this is unsuccessful or                            even one time Botox just to see if it helps Procedure Code(s):        --- Professional ---                           [phone], Esophagogastroduodenoscopy, flexible,                            transoral; with insertion of guide wire followed by                            passage of dilator(s) through esophagus over guide                            wire Diagnosis Code(s):        --- Professional ---                           K22.2, Esophageal obstruction                           K25.9, Gastric ulcer, unspecified as acute or                            chronic, without hemorrhage or perforation                           K22.4, Dyskinesia of esophagus                           R13.10, Dysphagia, unspecified CPT copyright 2016 American Medical Association. All rights reserved. The codes documented in this report are preliminary and upon coder review may  be revised to meet current compliance requirements. Clarene Essex, MD 09/28/2015 9:24:22 AM This report has been signed electronically. Number of Addenda: 0

## 2015-09-28 NOTE — Anesthesia Procedure Notes (Signed)
Procedure Name: MAC Date/Time: 09/28/2015 8:52 AM Performed by: Lance Coon Pre-anesthesia Checklist: Patient identified, Emergency Drugs available, Suction available, Patient being monitored and Timeout performed Patient Re-evaluated:Patient Re-evaluated prior to inductionOxygen Delivery Method: Circle system utilized

## 2015-09-29 NOTE — Anesthesia Postprocedure Evaluation (Signed)
Anesthesia Post Note  Patient: [name] [name]  Procedure(s) Performed: Procedure(s) (LRB): ESOPHAGOGASTRODUODENOSCOPY (EGD) WITH PROPOFOL (N/A) SAVORY DILATION (N/A)  Anesthesia Post Evaluation  Last Vitals:  Vitals:   09/28/15 0922 09/28/15 0933  BP: 131/62 (!) 148/73  Pulse: 66 67  Resp: 14 (!) 21  Temp:      Last Pain:  Vitals:   09/28/15 0747  TempSrc: Oral                 Kenyana Husak,JAMES TERRILL

## 2016-05-07 ENCOUNTER — Ambulatory Visit
Admission: RE | Admit: 2016-05-07 | Discharge: 2016-05-07 | Disposition: A | Discharge status: Home / Self Care | Payer: Medicare Other | Source: Ambulatory Visit | Attending: Geriatric Medicine | Admitting: Geriatric Medicine

## 2016-05-07 ENCOUNTER — Other Ambulatory Visit: Payer: Self-pay | Admitting: Geriatric Medicine

## 2016-05-07 DIAGNOSIS — M79641 Pain in right hand: Secondary | ICD-10-CM

## 2016-06-01 ENCOUNTER — Other Ambulatory Visit: Payer: Self-pay | Admitting: Interventional Cardiology

## 2016-06-01 DIAGNOSIS — I209 Angina pectoris, unspecified: Secondary | ICD-10-CM

## 2016-06-25 ENCOUNTER — Other Ambulatory Visit: Payer: Self-pay | Admitting: Interventional Cardiology

## 2016-06-25 DIAGNOSIS — I209 Angina pectoris, unspecified: Secondary | ICD-10-CM

## 2016-07-09 NOTE — Progress Notes (Signed)
Cardiology Office Note    Date:  07/10/2016   ID:  [name] [name], DOB April 01, 1940, MRN [id number]  PCP:  [name], MD  Cardiologist: Sinclair Grooms, MD   Chief Complaint  Patient presents with  . Coronary Artery Disease    History of Present Illness:  [name] [name] is a 76 y.o. male follow-up of COPD, CAD with chronic total occlusion of RCA, essential hypertension, hyperlipidemia, obesity, obstructive sleep apnea, and diabetes mellitus with CKD  He is still smoking cigarettes and seems to be proud of it. He denies chest pain pattern that has changed. He uses nitroglycerin infrequently. This is because he has been instructed by his gastroenterologist, Dr. Watt Climes to take a sublingual nitroglycerin prior to each meal. This seems to help his swallowing. He does not have orthopnea or lower extremity swelling. He has not had syncope but has occasional dizzy episodes that are vague and not well characterized.  Past Medical History:  Diagnosis Date  . Adenomatous polyp 2007  . Arthritis    "my body"  . B12 deficiency   . BPH (benign prostatic hyperplasia)   . Chronic back pain    "all over"  . Chronic low back pain   . Coronary artery disease 2009  . DDD (degenerative disc disease), cervical   . DDD (degenerative disc disease), lumbosacral   . DDD (degenerative disc disease), thoracic   . Depression   . Diabetes mellitus without complication ([place])    Type II  . Gastroesophageal reflux   . Gynecomastia 06/2009  . Hyperglycemia   . Hyperlipidemia LDL goal < 70   . MI (myocardial infarction) ([place]) 08/1989; 1993  . OSA on CPAP    "have mask; don't wear it" (02/02/2014) 09/25/15- now has a BiPAp- doesn'tt use it  . Peptic ulcer disease 1959  . Right bundle branch block   . Shortness of breath dyspnea   . Skin cancer    "burned/froze off my back" (02/02/2014)  . Tobacco abuse   . Vertebral compression fracture ([place]) 1970    Past Surgical History:  Procedure Laterality Date    . BYPASS GRAFT POPLITEAL TO POPLITEAL Left 06/2005   Dr Donnetta Hutching  . BYPASS GRAFT POPLITEAL TO POPLITEAL Right 02/2001   Dr Deon Pilling  . CARDIAC CATHETERIZATION  1991; 1993; 08/2012; 11/2013; 02/02/2014   I've had 6-7 total  . CARDIAC CATHETERIZATION  02/02/2014   Procedure: LEFT HEART CATH AND CORONARY ANGIOGRAPHY;  Surgeon: Jettie Booze, MD;  mild disease in LAD/CFX, RCA w/ CTO, unsuccessfull PCI both w/ antegrade and retrograde approaches   . CATARACT EXTRACTION W/ INTRAOCULAR LENS  IMPLANT, BILATERAL Bilateral ~ 2012  . COLONOSCOPY W/ POLYPECTOMY    . CORONARY ANGIOPLASTY  1990's  . ESOPHAGOGASTRODUODENOSCOPY (EGD) WITH ESOPHAGEAL DILATION  11/2007  . ESOPHAGOGASTRODUODENOSCOPY (EGD) WITH PROPOFOL N/A 09/28/2015   Procedure: ESOPHAGOGASTRODUODENOSCOPY (EGD) WITH PROPOFOL;  Surgeon: Clarene Essex, MD;  Location: [facility] ENDOSCOPY;  Service: Endoscopy;  Laterality: N/A;  . EYE SURGERY Bilateral   . JOINT REPLACEMENT    . LEFT HEART CATHETERIZATION WITH CORONARY ANGIOGRAM N/A 09/03/2012   Procedure: LEFT HEART CATHETERIZATION WITH CORONARY ANGIOGRAM;  Surgeon: Sinclair Grooms, MD;  Location: [facility] CATH LAB;  Service: Cardiovascular;  Laterality: N/A;  . LEFT HEART CATHETERIZATION WITH CORONARY ANGIOGRAM N/A 12/01/2013   Procedure: LEFT HEART CATHETERIZATION WITH CORONARY ANGIOGRAM;  Surgeon: Sinclair Grooms, MD;  Location: [facility] CATH LAB;  Service: Cardiovascular;  Laterality: N/A;  . LOWER EXTREMITY  ANGIOGRAM Left 05/2005   L pop-pop BPG patent  . NASAL SEPTUM SURGERY  1977  . PENILE PROSTHESIS IMPLANT  1990's  . SAVORY DILATION N/A 09/28/2015   Procedure: SAVORY DILATION;  Surgeon: Clarene Essex, MD;  Location[facility] ENDOSCOPY;  Service: Endoscopy;  Laterality: N/A;  have balloons available  . SCROTAL SURGERY  08/1999   pump revision/notes 07/11/2010  . TOTAL KNEE ARTHROPLASTY Left 01/2009    Current Medications: Outpatient Medications Prior to Visit  Medication Sig Dispense Refill  . aspirin EC 81 MG  tablet Take 1 tablet (81 mg total) by mouth daily. Resume after 4 days.    Marland Kitchen buPROPion (WELLBUTRIN XL) 300 MG 24 hr tablet Take 300 mg by mouth daily.    . cetirizine (ZYRTEC) 10 MG tablet Take 10 mg by mouth daily as needed for allergies.   11  . cyanocobalamin (,VITAMIN B-12,) 1000 MCG/ML injection Inject 1,000 mcg into the muscle every 30 (thirty) days. No specific day of the month. Administered by daughter.    Marland Kitchen HYDROcodone-acetaminophen (NORCO/VICODIN) 5-325 MG tablet Take 1 tablet by mouth every 6 (six) hours as needed for severe pain. 30 tablet 0  . isosorbide mononitrate (IMDUR) 60 MG 24 hr tablet TAKE 1 TABLET BY MOUTH ONCE DAILY 30 tablet 0  . Melatonin 2.5 MG CHEW Chew 2.5 mg by mouth at bedtime as needed (sleep.).    Marland Kitchen nitroGLYCERIN (NITROSTAT) 0.4 MG SL tablet Place 1 tablet (0.4 mg total) under the tongue every 5 (five) minutes as needed for chest pain. 100 tablet 11  . nitroGLYCERIN (NITROSTAT) 0.4 MG SL tablet Place 0.4 mg under the tongue 3 (three) times daily. 30 minutes prior to meals for esophageal spasms.    . rosuvastatin (CRESTOR) 10 MG tablet Take 10 mg by mouth daily.    . tamsulosin (FLOMAX) 0.4 MG CAPS capsule Take 0.4 mg by mouth daily.     Marland Kitchen albuterol (PROVENTIL HFA;VENTOLIN HFA) 108 (90 Base) MCG/ACT inhaler Inhale 2 puffs into the lungs every 6 (six) hours as needed for wheezing or shortness of breath. (Patient not taking: Reported on 07/10/2016) 1 Inhaler 2  . docusate sodium (COLACE) 100 MG capsule Take 1 capsule (100 mg total) by mouth 2 (two) times daily. (Patient not taking: Reported on 07/10/2016) 60 capsule 0  . metFORMIN (GLUCOPHAGE-XR) 500 MG 24 hr tablet Take 1 tablet (500 mg total) by mouth daily with breakfast. HOLD for 48 hours. Restart on 02/05/2014 (Patient not taking: Reported on 07/10/2016)  1  . metoprolol (LOPRESSOR) 50 MG tablet TAKE ONE TABLET BY MOUTH TWICE DAILY (Patient not taking: Reported on 07/10/2016) 60 tablet 1  . pantoprazole (PROTONIX) 40 MG  tablet Take 1 tablet (40 mg total) by mouth daily. (Patient not taking: Reported on 07/10/2016) 90 tablet 1   No facility-administered medications prior to visit.      Allergies:   Lipitor [atorvastatin]; Other; Sulfa antibiotics; Sulfacetamide sodium; and Vytorin [ezetimibe-simvastatin]   Social History   Social History  . Marital status: Widowed    Spouse name: N/A  . Number of children: N/A  . Years of education: N/A   Social History Main Topics  . Smoking status: Current Some Day Smoker    Packs/day: 1.00    Years: 59.00    Types: Cigarettes    Last attempt to quit: 08/25/2012  . Smokeless tobacco: Former Systems developer    Types: Chew     Comment: Smokes 1- 2 cigs a day since wife died Jul 26, 2015  .  Alcohol use No  . Drug use: No  . Sexual activity: Not Asked   Other Topics Concern  . None   Social History Narrative   Tobacco use cigarettes: former smoker, quit in year 2014. Pack-year Hx: 32, tobacco history last updated 05/12/2013, additional findings: tobacco user moderate cigarette smoker (10-19 cigs/day), additional findings: tobacco Non-User current non-smoker, no smoking. No alcohol, stopped 1981. 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 Marital status: married. 4 children.     Family History:  The patient's family history includes Alzheimer's disease (age of onset: 12) in his mother; Diabetes in his daughter; Diabetes (age of onset: 16) in his father; Fibromyalgia (age of onset: 58) in his sister; Hypertension in his son; Obesity in his daughter; Peripheral vascular disease in his father; Pneumonia in his father.   ROS:   Please see the history of present illness.    Continues to smoke cigarettes heavily. Has arthritis in his knees. Occasional dizzy spells. Chronic shortness of breath but not changed from prior. Significant difficulty with swallowing.  All other systems reviewed and are negative.   PHYSICAL EXAM:   VS:  BP  106/62 (BP Location: Right Arm)   Pulse (!) 57   Ht 5\' 9"  (1.753 m)   Wt 228 lb 12.8 oz (103.8 kg)   BMI 33.79 kg/m    GEN: Well nourished, well developed, in no acute distress .He has a full head of Quincy Simmonds here and actually considering his problems. She and her than his stated age. HEENT: normal  Neck: no JVD, carotid bruits, or masses Cardiac: RRR; no murmurs, rubs, or gallops,no edema  Respiratory:  clear to auscultation bilaterally, normal work of breathing. There are distant breath sounds. GI: soft, nontender, nondistended, + BS MS: no deformity or atrophy  Skin: warm and dry, no rash Neuro:  Alert and Oriented x 3, Strength and sensation are intact Psych: euthymic mood, full affect  Wt Readings from Last 3 Encounters:  07/10/16 228 lb 12.8 oz (103.8 kg)  07/10/15 235 lb 12.8 oz (107 kg)  06/16/15 241 lb 6.4 oz (109.5 kg)      Studies/Labs Reviewed:   EKG:  EKG  Normal sinus rhythm, right bundle-branch block, and when compared to prior tracings, no significant changes of occurred.  Recent Labs: No results found for requested labs within last 8760 hours.   Lipid Panel    Component Value Date/Time   CHOL 100 09/04/2012 0515   TRIG 136 09/04/2012 0515   HDL 31 (L) 09/04/2012 0515   CHOLHDL 3.2 09/04/2012 0515   VLDL 27 09/04/2012 0515   LDLCALC 42 09/04/2012 0515    Additional studies/ records that were reviewed today include:  No new data no new evaluations.    ASSESSMENT:    1. Coronary artery disease involving native coronary artery of native heart with angina pectoris ([place])   2. Essential hypertension   3. Hyperlipidemia with target LDL less than 70   4. Right bundle branch block   5. Gastroesophageal reflux disease with esophagitis      PLAN:  In order of problems listed above:  1. Stable from an anginal standpoint. He is known to have chronic stable right coronary occlusion with collaterals. Nitroglycerin seems remove any chest discomfort. 2. Well  controlled target should be 140/90 mmHg a last period 3. LDL target less than 70 as our goal. 4. EKG is stable/has not changed.  Counseled to stop smoking. Calcitonin notified us of  increasing episodes of chest pain. Plan clinical follow-up in one year. Copy of today's EKG will be sent his primary care physician, Dr. Felipa Eth.  Medication Adjustments/Labs and Tests Ordered: Current medicines are reviewed at length with the patient today.  Concerns regarding medicines are outlined above.  Medication changes, Labs and Tests ordered today are listed in the Patient Instructions below. Patient Instructions  Medication Instructions:  None  Labwork: None  Testing/Procedures: None  Follow-Up: Your physician wants you to follow-up in: 1 year with Dr. [name].  You will receive a reminder letter in the mail two months in advance. If you don't receive a letter, please call our office to schedule the follow-up appointment.   Any Other Special Instructions Will Be Listed Below (If Applicable).     If you need a refill on your cardiac medications before your next appointment, please call your pharmacy.      Signed, Sinclair Grooms, MD  07/10/2016 10:47 AM    [facility] [address], [place], [place]  [address] Phone: [phone]; Fax: [phone]

## 2016-07-10 ENCOUNTER — Ambulatory Visit (INDEPENDENT_AMBULATORY_CARE_PROVIDER_SITE_OTHER): Payer: Medicare Other | Admitting: Interventional Cardiology

## 2016-07-10 ENCOUNTER — Encounter: Payer: Self-pay | Admitting: Interventional Cardiology

## 2016-07-10 VITALS — BP 106/62 | HR 57 | Ht 69.0 in | Wt 228.8 lb

## 2016-07-10 DIAGNOSIS — I10 Essential (primary) hypertension: Secondary | ICD-10-CM | POA: Diagnosis not present

## 2016-07-10 DIAGNOSIS — K2100 Gastro-esophageal reflux disease with esophagitis, without bleeding: Secondary | ICD-10-CM

## 2016-07-10 DIAGNOSIS — E785 Hyperlipidemia, unspecified: Secondary | ICD-10-CM

## 2016-07-10 DIAGNOSIS — I25119 Atherosclerotic heart disease of native coronary artery with unspecified angina pectoris: Secondary | ICD-10-CM

## 2016-07-10 DIAGNOSIS — I4510 Unspecified right bundle-branch block: Secondary | ICD-10-CM

## 2016-07-10 NOTE — Patient Instructions (Signed)

## 2016-07-26 ENCOUNTER — Other Ambulatory Visit: Payer: Self-pay | Admitting: Interventional Cardiology

## 2016-07-26 DIAGNOSIS — I209 Angina pectoris, unspecified: Secondary | ICD-10-CM

## 2016-08-19 DIAGNOSIS — L608 Other nail disorders: Secondary | ICD-10-CM | POA: Insufficient documentation

## 2016-08-22 ENCOUNTER — Other Ambulatory Visit: Payer: Self-pay | Admitting: Orthopedic Surgery

## 2016-09-11 ENCOUNTER — Encounter (HOSPITAL_BASED_OUTPATIENT_CLINIC_OR_DEPARTMENT_OTHER): Payer: Self-pay | Admitting: *Deleted

## 2016-09-13 ENCOUNTER — Encounter (HOSPITAL_BASED_OUTPATIENT_CLINIC_OR_DEPARTMENT_OTHER)
Admission: RE | Admit: 2016-09-13 | Discharge: 2016-09-13 | Disposition: A | Discharge status: Home / Self Care | Payer: Medicare Other | Source: Ambulatory Visit | Attending: Orthopedic Surgery | Admitting: Orthopedic Surgery

## 2016-09-13 DIAGNOSIS — Z01818 Encounter for other preprocedural examination: Secondary | ICD-10-CM | POA: Insufficient documentation

## 2016-09-13 LAB — BASIC METABOLIC PANEL
Anion gap: 6 (ref 5–15)
BUN: 13 mg/dL (ref 6–20)
CALCIUM: 9.3 mg/dL (ref 8.9–10.3)
CO2: 27 mmol/L (ref 22–32)
CREATININE: 0.77 mg/dL (ref 0.61–1.24)
Chloride: 103 mmol/L (ref 101–111)
Glucose, Bld: 111 mg/dL — ABNORMAL HIGH (ref 65–99)
Potassium: 4.6 mmol/L (ref 3.5–5.1)
SODIUM: 136 mmol/L (ref 135–145)

## 2016-09-17 ENCOUNTER — Ambulatory Visit (HOSPITAL_BASED_OUTPATIENT_CLINIC_OR_DEPARTMENT_OTHER)
Admission: RE | Admit: 2016-09-17 | Discharge: 2016-09-17 | Disposition: A | Discharge status: Home / Self Care | Payer: Medicare Other | Source: Ambulatory Visit | Attending: Orthopedic Surgery | Admitting: Orthopedic Surgery

## 2016-09-17 ENCOUNTER — Encounter (HOSPITAL_BASED_OUTPATIENT_CLINIC_OR_DEPARTMENT_OTHER): Payer: Self-pay | Admitting: Certified Registered"

## 2016-09-17 ENCOUNTER — Ambulatory Visit (HOSPITAL_BASED_OUTPATIENT_CLINIC_OR_DEPARTMENT_OTHER): Payer: Medicare Other | Admitting: Certified Registered"

## 2016-09-17 ENCOUNTER — Encounter (HOSPITAL_BASED_OUTPATIENT_CLINIC_OR_DEPARTMENT_OTHER)
Admission: RE | Disposition: A | Discharge status: Home / Self Care | Payer: Self-pay | Source: Ambulatory Visit | Attending: Orthopedic Surgery

## 2016-09-17 DIAGNOSIS — I2510 Atherosclerotic heart disease of native coronary artery without angina pectoris: Secondary | ICD-10-CM | POA: Diagnosis not present

## 2016-09-17 DIAGNOSIS — K219 Gastro-esophageal reflux disease without esophagitis: Secondary | ICD-10-CM | POA: Insufficient documentation

## 2016-09-17 DIAGNOSIS — Z7982 Long term (current) use of aspirin: Secondary | ICD-10-CM | POA: Diagnosis not present

## 2016-09-17 DIAGNOSIS — F17210 Nicotine dependence, cigarettes, uncomplicated: Secondary | ICD-10-CM | POA: Diagnosis not present

## 2016-09-17 DIAGNOSIS — Z79899 Other long term (current) drug therapy: Secondary | ICD-10-CM | POA: Insufficient documentation

## 2016-09-17 DIAGNOSIS — N400 Enlarged prostate without lower urinary tract symptoms: Secondary | ICD-10-CM | POA: Diagnosis not present

## 2016-09-17 DIAGNOSIS — Z7984 Long term (current) use of oral hypoglycemic drugs: Secondary | ICD-10-CM | POA: Diagnosis not present

## 2016-09-17 DIAGNOSIS — I252 Old myocardial infarction: Secondary | ICD-10-CM | POA: Diagnosis not present

## 2016-09-17 DIAGNOSIS — F329 Major depressive disorder, single episode, unspecified: Secondary | ICD-10-CM | POA: Insufficient documentation

## 2016-09-17 DIAGNOSIS — E119 Type 2 diabetes mellitus without complications: Secondary | ICD-10-CM | POA: Diagnosis not present

## 2016-09-17 DIAGNOSIS — L608 Other nail disorders: Secondary | ICD-10-CM | POA: Insufficient documentation

## 2016-09-17 DIAGNOSIS — M5030 Other cervical disc degeneration, unspecified cervical region: Secondary | ICD-10-CM | POA: Insufficient documentation

## 2016-09-17 DIAGNOSIS — G4733 Obstructive sleep apnea (adult) (pediatric): Secondary | ICD-10-CM | POA: Insufficient documentation

## 2016-09-17 DIAGNOSIS — E785 Hyperlipidemia, unspecified: Secondary | ICD-10-CM | POA: Diagnosis not present

## 2016-09-17 DIAGNOSIS — M5137 Other intervertebral disc degeneration, lumbosacral region: Secondary | ICD-10-CM | POA: Diagnosis not present

## 2016-09-17 HISTORY — PX: SKIN FULL THICKNESS GRAFT: SHX442

## 2016-09-17 HISTORY — PX: NAILBED REPAIR: SHX5028

## 2016-09-17 LAB — GLUCOSE, CAPILLARY
GLUCOSE-CAPILLARY: 97 mg/dL (ref 65–99)
Glucose-Capillary: 109 mg/dL — ABNORMAL HIGH (ref 65–99)

## 2016-09-17 SURGERY — REPAIR, NAIL BED
Anesthesia: Monitor Anesthesia Care | Site: Finger | Laterality: Right

## 2016-09-17 MED ORDER — SCOPOLAMINE 1 MG/3DAYS TD PT72: 1.0000 | MEDICATED_PATCH | Freq: Once | TRANSDERMAL | Status: DC | PRN

## 2016-09-17 MED ORDER — MEPERIDINE HCL 25 MG/ML IJ SOLN: 6.2500 mg | INTRAMUSCULAR | Status: DC | PRN

## 2016-09-17 MED ORDER — BUPIVACAINE HCL (PF) 0.25 % IJ SOLN
INTRAMUSCULAR | Status: DC | PRN
  Administered 2016-09-17: 10 mL

## 2016-09-17 MED ORDER — LACTATED RINGERS IV SOLN
INTRAVENOUS | Status: DC
  Administered 2016-09-17 (×2): via INTRAVENOUS

## 2016-09-17 MED ORDER — HYDROCODONE-ACETAMINOPHEN 7.5-325 MG PO TABS: 1.0000 | ORAL_TABLET | Freq: Once | ORAL | Status: DC | PRN

## 2016-09-17 MED ORDER — BUPIVACAINE HCL (PF) 0.25 % IJ SOLN
INTRAMUSCULAR | Status: AC
  Filled 2016-09-17: qty 30

## 2016-09-17 MED ORDER — PROMETHAZINE HCL 25 MG/ML IJ SOLN: 6.2500 mg | INTRAMUSCULAR | Status: DC | PRN

## 2016-09-17 MED ORDER — LIDOCAINE HCL (PF) 1 % IJ SOLN
INTRAMUSCULAR | Status: AC
  Filled 2016-09-17: qty 5

## 2016-09-17 MED ORDER — FENTANYL CITRATE (PF) 100 MCG/2ML IJ SOLN
50.0000 ug | INTRAMUSCULAR | Status: DC | PRN
  Administered 2016-09-17: 50 ug via INTRAVENOUS

## 2016-09-17 MED ORDER — ONDANSETRON HCL 4 MG/2ML IJ SOLN
INTRAMUSCULAR | Status: DC | PRN
  Administered 2016-09-17: 4 mg via INTRAVENOUS

## 2016-09-17 MED ORDER — LIDOCAINE HCL (CARDIAC) 20 MG/ML IV SOLN
INTRAVENOUS | Status: DC | PRN
  Administered 2016-09-17: 30 mg via INTRAVENOUS

## 2016-09-17 MED ORDER — CEFAZOLIN SODIUM-DEXTROSE 2-4 GM/100ML-% IV SOLN
2.0000 g | INTRAVENOUS | Status: AC
  Administered 2016-09-17: 2 g via INTRAVENOUS

## 2016-09-17 MED ORDER — MIDAZOLAM HCL 2 MG/2ML IJ SOLN
1.0000 mg | INTRAMUSCULAR | Status: DC | PRN
Start: 2016-09-17 — End: 2016-09-17

## 2016-09-17 MED ORDER — PROPOFOL 500 MG/50ML IV EMUL
INTRAVENOUS | Status: DC | PRN
  Administered 2016-09-17: 50 ug/kg/min via INTRAVENOUS

## 2016-09-17 MED ORDER — CEFAZOLIN SODIUM-DEXTROSE 2-4 GM/100ML-% IV SOLN
INTRAVENOUS | Status: AC
  Filled 2016-09-17: qty 100

## 2016-09-17 MED ORDER — FENTANYL CITRATE (PF) 100 MCG/2ML IJ SOLN
INTRAMUSCULAR | Status: AC
  Filled 2016-09-17: qty 2

## 2016-09-17 MED ORDER — LIDOCAINE HCL (PF) 0.5 % IJ SOLN
INTRAMUSCULAR | Status: DC | PRN
  Administered 2016-09-17: 30 mL via INTRAVENOUS

## 2016-09-17 MED ORDER — 0.9 % SODIUM CHLORIDE (POUR BTL) OPTIME
TOPICAL | Status: DC | PRN
  Administered 2016-09-17: 150 mL

## 2016-09-17 MED ORDER — FENTANYL CITRATE (PF) 100 MCG/2ML IJ SOLN: 25.0000 ug | INTRAMUSCULAR | Status: DC | PRN

## 2016-09-17 MED ORDER — HYDROCODONE-ACETAMINOPHEN 5-325 MG PO TABS: 1.0000 | ORAL_TABLET | Freq: Four times a day (QID) | ORAL | 0 refills | Status: DC | PRN

## 2016-09-17 MED ORDER — CHLORHEXIDINE GLUCONATE 4 % EX LIQD: 60.0000 mL | Freq: Once | CUTANEOUS | Status: DC

## 2016-09-17 SURGICAL SUPPLY — 44 items
APL SKNCLS STERI-STRIP NONHPOA (GAUZE/BANDAGES/DRESSINGS) ×3
BENZOIN TINCTURE PRP APPL 2/3 (GAUZE/BANDAGES/DRESSINGS) ×2 IMPLANT
BLADE MINI RND TIP GREEN BEAV (BLADE) ×2 IMPLANT
BLADE SURG 15 STRL LF DISP TIS (BLADE) ×3 IMPLANT
BLADE SURG 15 STRL SS (BLADE) ×4
BNDG CMPR 9X4 STRL LF SNTH (GAUZE/BANDAGES/DRESSINGS)
BNDG COHESIVE 1X5 TAN STRL LF (GAUZE/BANDAGES/DRESSINGS) ×2 IMPLANT
BNDG ESMARK 4X9 LF (GAUZE/BANDAGES/DRESSINGS) IMPLANT
CHLORAPREP W/TINT 26ML (MISCELLANEOUS) ×4 IMPLANT
CORD BIPOLAR FORCEPS 12FT (ELECTRODE) ×4 IMPLANT
COVER BACK TABLE 60X90IN (DRAPES) ×4 IMPLANT
COVER MAYO STAND STRL (DRAPES) ×4 IMPLANT
CUFF TOURN SGL LL 18 NRW (TOURNIQUET CUFF) IMPLANT
CUFF TOURNIQUET SINGLE 18IN (TOURNIQUET CUFF) IMPLANT
CUFF TOURNIQUET SINGLE 24IN (TOURNIQUET CUFF) IMPLANT
DRAPE EXTREMITY T 121X128X90 (DRAPE) ×4 IMPLANT
DRAPE SURG 17X23 STRL (DRAPES) ×4 IMPLANT
GAUZE SPONGE 4X4 12PLY STRL (GAUZE/BANDAGES/DRESSINGS) ×4 IMPLANT
GAUZE XEROFORM 1X8 LF (GAUZE/BANDAGES/DRESSINGS) ×4 IMPLANT
GLOVE BIOGEL PI IND STRL 7.0 (GLOVE) ×3 IMPLANT
GLOVE BIOGEL PI IND STRL 8.5 (GLOVE) ×3 IMPLANT
GLOVE BIOGEL PI INDICATOR 7.0 (GLOVE) ×3
GLOVE BIOGEL PI INDICATOR 8.5 (GLOVE) ×1
GLOVE ECLIPSE 6.5 STRL STRAW (GLOVE) ×2 IMPLANT
GLOVE SURG ORTHO 8.0 STRL STRW (GLOVE) ×4 IMPLANT
GOWN STRL REUS W/ TWL LRG LVL3 (GOWN DISPOSABLE) ×3 IMPLANT
GOWN STRL REUS W/TWL LRG LVL3 (GOWN DISPOSABLE) ×4
GOWN STRL REUS W/TWL XL LVL3 (GOWN DISPOSABLE) ×4 IMPLANT
NDL PRECISIONGLIDE 27X1.5 (NEEDLE) IMPLANT
NEEDLE PRECISIONGLIDE 27X1.5 (NEEDLE) ×4 IMPLANT
NS IRRIG 1000ML POUR BTL (IV SOLUTION) ×4 IMPLANT
PACK BASIN DAY SURGERY FS (CUSTOM PROCEDURE TRAY) ×4 IMPLANT
PADDING CAST ABS 4INX4YD NS (CAST SUPPLIES) ×1
PADDING CAST ABS COTTON 4X4 ST (CAST SUPPLIES) ×3 IMPLANT
SPLINT FINGER 4.25 BULB 911906 (SOFTGOODS) ×2 IMPLANT
STOCKINETTE 4X48 STRL (DRAPES) ×4 IMPLANT
STRIP CLOSURE SKIN 1/4X4 (GAUZE/BANDAGES/DRESSINGS) ×2 IMPLANT
SUT CHROMIC 6 0 G 1 (SUTURE) ×2 IMPLANT
SUT ETHILON 4 0 PS 2 18 (SUTURE) ×4 IMPLANT
SUT VIC AB 4-0 P2 18 (SUTURE) IMPLANT
SYR BULB 3OZ (MISCELLANEOUS) ×4 IMPLANT
SYR CONTROL 10ML LL (SYRINGE) ×2 IMPLANT
TOWEL OR 17X24 6PK STRL BLUE (TOWEL DISPOSABLE) ×8 IMPLANT
UNDERPAD 30X30 (UNDERPADS AND DIAPERS) ×4 IMPLANT

## 2016-09-17 NOTE — Op Note (Signed)
Dictation Number [id number]

## 2016-09-17 NOTE — Anesthesia Postprocedure Evaluation (Signed)
Anesthesia Post Note  Patient: [name] [name]  Procedure(s) Performed: Procedure(s) (LRB): ABLATION NAILBED RIGHT INDEX (Right) SKIN GRAFT FROM UPPER ARM (Right)     Patient location during evaluation: PACU Anesthesia Type: MAC and Regional Level of consciousness: awake and alert Pain management: pain level controlled Vital Signs Assessment: post-procedure vital signs reviewed and stable Respiratory status: spontaneous breathing and respiratory function stable Cardiovascular status: stable Anesthetic complications: no    Last Vitals:  Vitals:   09/17/16 1215 09/17/16 1230  BP: (!) 143/63 (!) 152/74  Pulse: (!) 54 (!) 57  Resp: 17 17  Temp:      Last Pain:  Vitals:   09/17/16 1215  TempSrc:   PainSc: 4                  [name]

## 2016-09-17 NOTE — Brief Op Note (Signed)
09/17/2016  11:40 AM  PATIENT:  [name]  76 y.o. male  PRE-OPERATIVE DIAGNOSIS:  RIGHT INDEX FINGER PINCER NAIL DEFORMITY  POST-OPERATIVE DIAGNOSIS:  RIGHT INDEX FINGER PINCER NAIL DEFORMITY  PROCEDURE:  Procedure(s): ABLATION NAILBED RIGHT INDEX (Right) SKIN GRAFT FROM UPPER ARM (Right)  SURGEON:  Surgeon(s) and Role:    * Daryll Brod, MD - Primary  PHYSICIAN ASSISTANT:   ASSISTANTS: none   ANESTHESIA:   local and regional  EBL:  Total I/O In: 1000 [I.V.:1000] Out: -   BLOOD ADMINISTERED:none  DRAINS: none   LOCAL MEDICATIONS USED:  BUPIVICAINE   SPECIMEN:  No Specimen  DISPOSITION OF SPECIMEN:  N/A  COUNTS:  YES  TOURNIQUET:   Total Tourniquet Time Documented: Upper Arm (Right) - 50 minutes Total: Upper Arm (Right) - 50 minutes   DICTATION: .Other Dictation: Dictation Number [id number]  PLAN OF CARE: Discharge to home after PACU  PATIENT DISPOSITION:  PACU - hemodynamically stable.

## 2016-09-17 NOTE — Op Note (Signed)
[name] [name], [name] [name] NO.:  [id number]  MEDICAL RECORD NO.:  [id number]  LOCATION:                                 FACILITY:  PHYSICIAN:  Daryll Brod, M.D.            DATE OF BIRTH:  DATE OF PROCEDURE:  09/17/2016 DATE OF DISCHARGE:                              OPERATIVE REPORT   PREOPERATIVE DIAGNOSIS:  Pincer nail right index finger.  POSTOPERATIVE DIAGNOSIS:  Pincer nail right index finger.  OPERATION:  Ablation of nail with full-thickness skin graft from upper arm to right index finger.  SURGEON:  Daryll Brod, M.D.  ASSISTANT:  None.  ANESTHESIA:  Upper arm IV regional with local infiltration.  PLACE OF SURGERY:  [facility].  ANESTHESIOLOGIST:  Montez Hageman, MD.  HISTORY:  The patient is a 76 year old male with a history of pincer nail on his right index finger.  We have had a thorough discussion as to the etiology and various treatment alternatives, and he has elected to ablate the nail with full-thickness skin graft from the upper arm.  The pre, peri and postoperative course have been discussed along with risks and complications.  He is aware there is no guarantee with the surgery; the possibility of infection; recurrence of injury to arteries, nerves, tendons; incomplete relief of symptoms; dystrophy; possibility of nail horn, corn formation.  In preoperative area, the patient is seen, the extremity was marked by both patient and surgeon.  Antibiotic given.  PROCEDURE IN DETAIL:  The patient was brought to the operating room, where an upper arm IV regional anesthetic was carried out without difficulty.  He was prepped using ChloraPrep in a supine position with the right arm free.  A 3-minute dry time was allowed, and time-out taken, confirming the patient and procedure.  A metacarpal block was then given with 0.25% bupivacaine without epinephrine, 8 mL was used. The nail plate was removed with a Astronomer.  The area of defect in the tissue was noted.  A Beaver blade was then used to excise the nail matrix on nail fold proximally, both dorsally and palmarly.  This was entirely removed.  A template was then taken of the defect created.  This was measured twice, and this was placed on the upper inner arm.  An elliptical incision was made after marking the exact area of necessary graft.  The ellipse was made and the full-thickness skin graft removed down to the dermis.  The fatty tissue was removed.  The margins were undermined.  This allowed the wound to be closed with interrupted 4-0 nylon sutures.  A local infiltration with 0.25% bupivacaine was then given, approximately 3 mL was used.  The nail graft was then placed onto the nail bed.  This was done after irrigating the wound copiously with saline.  This was then sutured into position with a running 6-0 chromic suture.  A stent dressing was then performed. Steri-Strips were applied with benzoin.  A sterile compressive dressing was then applied along with a splint.  A dressing was placed on the upper arm.  The tourniquet was deflated.  He was taken to the recovery room for observation in satisfactory condition.  He will be discharged to home to return to [place] in 1 week, on [place].          ______________________________ Daryll Brod, M.D.     GK/MEDQ  D:  09/17/2016  T:  09/17/2016  Job:  [id number]

## 2016-09-17 NOTE — Anesthesia Procedure Notes (Signed)
Anesthesia Regional Block: Bier block (IV Regional)   Pre-Anesthetic Checklist: ,, timeout performed, Correct Patient, Correct Site, Correct Laterality, Correct Procedure,, site marked, surgical consent,, at surgeon's request  Laterality: Right     Needles:  Injection technique: Single-shot  Needle Type: Other      Needle Gauge: 20     Additional Needles:   Procedures:,,,,,,, Esmarch exsanguination, single tourniquet utilized,  Narrative:   Performed by: Personally

## 2016-09-17 NOTE — Discharge Instructions (Addendum)

## 2016-09-17 NOTE — Anesthesia Preprocedure Evaluation (Signed)
Anesthesia Evaluation  Patient identified by MRN, date of birth, ID band Patient awake    Airway Mallampati: II  TM Distance: >3 FB Neck ROM: Full    Dental  (+) Teeth Intact   Pulmonary shortness of breath, sleep apnea , Current Smoker,    breath sounds clear to auscultation       Cardiovascular hypertension, + angina + CAD, + Past MI and + Peripheral Vascular Disease  + dysrhythmias  Rhythm:Regular Rate:Normal     Neuro/Psych    GI/Hepatic GERD  Controlled,  Endo/Other  diabetes, Well Controlled  Renal/GU      Musculoskeletal  (+) Arthritis ,   Abdominal (+) + obese,   Peds  Hematology   Anesthesia Other Findings   Reproductive/Obstetrics                             Anesthesia Physical  Anesthesia Plan  ASA: III  Anesthesia Plan: MAC   Post-op Pain Management:    Induction: Intravenous  PONV Risk Score and Plan:   Airway Management Planned: Natural Airway and Nasal Cannula  Additional Equipment:   Intra-op Plan:   Post-operative Plan:   Informed Consent: I have reviewed the patients History and Physical, chart, labs and discussed the procedure including the risks, benefits and alternatives for the proposed anesthesia with the patient or authorized representative who has indicated his/her understanding and acceptance.   Dental advisory given  Plan Discussed with: CRNA  Anesthesia Plan Comments:         Anesthesia Quick Evaluation

## 2016-09-17 NOTE — Transfer of Care (Signed)
Immediate Anesthesia Transfer of Care Note  Patient: [name] [name]  Procedure(s) Performed: Procedure(s): ABLATION NAILBED RIGHT INDEX (Right) SKIN GRAFT FROM UPPER ARM (Right)  Patient Location: PACU  Anesthesia Type:Bier block  Level of Consciousness: awake, alert , oriented and patient cooperative  Airway & Oxygen Therapy: Patient Spontanous Breathing and Patient connected to face mask oxygen  Post-op Assessment: Report given to RN and Post -op Vital signs reviewed and stable  Post vital signs: Reviewed and stable  Last Vitals:  Vitals:   09/17/16 0904  BP: 118/61  Pulse: (!) 53  Resp: 18  Temp: (!) 36.4 C    Last Pain:  Vitals:   09/17/16 0932  TempSrc:   PainSc: 4       Patients Stated Pain Goal: 3 (30/09/23 3007)  Complications: No apparent anesthesia complications

## 2016-09-17 NOTE — H&P (Signed)
[name] [name] is an 76 y.o. male.   Chief Complaint:pincer nail right index HPI: . [name] [name] is 77 years old left-hand-dominant. He is referred by Dr. [name]. He is complaining of his right index finger. Is complaining of pain at the tip. Throbbing in nature with a VAS score 10/10. This been going on gradually for the past several months. He recalls no history of injury. He states heat helps. He states that it is primarily around his nail plate where the finger hurts. He states it is curving around. He has no history of injury. He has not had any other treatment for this. He does have a history of diabetes and arthritis. Has no history of thyroid problems or gout. Family history is positive diabetes negative for thyroid problems arthritis and gout. He has a history of catching of his right ring finger which has resolved. He is complaining of occasional numbness on his left index.       Past Medical History:  Diagnosis Date  . Adenomatous polyp 2007  . Arthritis    "my body"  . B12 deficiency   . BPH (benign prostatic hyperplasia)   . Chronic back pain    "all over"  . Chronic low back pain   . Coronary artery disease 2009  . DDD (degenerative disc disease), cervical   . DDD (degenerative disc disease), lumbosacral   . DDD (degenerative disc disease), thoracic   . Depression   . Diabetes mellitus without complication ([place])    Type II  . Gastroesophageal reflux   . Gynecomastia 06/2009  . Hyperglycemia   . Hyperlipidemia LDL goal < 70   . MI (myocardial infarction) ([place]) 08/1989; 1993  . OSA on CPAP    "have mask; don't wear it" (02/02/2014) 09/25/15- now has a BiPAp- doesn'tt use it  . Peptic ulcer disease 1959  . Right bundle branch block   . Shortness of breath dyspnea   . Skin cancer    "burned/froze off my back" (02/02/2014)  . Tobacco abuse   . Vertebral compression fracture ([place]) 1970    Past Surgical History:  Procedure Laterality Date  . BYPASS GRAFT POPLITEAL TO  POPLITEAL Left 06/2005   Dr Donnetta Hutching  . BYPASS GRAFT POPLITEAL TO POPLITEAL Right 02/2001   Dr Deon Pilling  . CARDIAC CATHETERIZATION  1991; 1993; 08/2012; 11/2013; 02/02/2014   I've had 6-7 total  . CARDIAC CATHETERIZATION  02/02/2014   Procedure: LEFT HEART CATH AND CORONARY ANGIOGRAPHY;  Surgeon: Jettie Booze, MD;  mild disease in LAD/CFX, RCA w/ CTO, unsuccessfull PCI both w/ antegrade and retrograde approaches   . CATARACT EXTRACTION W/ INTRAOCULAR LENS  IMPLANT, BILATERAL Bilateral ~ 2012  . COLONOSCOPY W/ POLYPECTOMY    . CORONARY ANGIOPLASTY  1990's  . ESOPHAGOGASTRODUODENOSCOPY (EGD) WITH ESOPHAGEAL DILATION  11/2007  . ESOPHAGOGASTRODUODENOSCOPY (EGD) WITH PROPOFOL N/A 09/28/2015   Procedure: ESOPHAGOGASTRODUODENOSCOPY (EGD) WITH PROPOFOL;  Surgeon: Clarene Essex, MD;  Location: [facility] ENDOSCOPY;  Service: Endoscopy;  Laterality: N/A;  . EYE SURGERY Bilateral   . JOINT REPLACEMENT    . LEFT HEART CATHETERIZATION WITH CORONARY ANGIOGRAM N/A 09/03/2012   Procedure: LEFT HEART CATHETERIZATION WITH CORONARY ANGIOGRAM;  Surgeon: Sinclair Grooms, MD;  Location: [facility] CATH LAB;  Service: Cardiovascular;  Laterality: N/A;  . LEFT HEART CATHETERIZATION WITH CORONARY ANGIOGRAM N/A 12/01/2013   Procedure: LEFT HEART CATHETERIZATION WITH CORONARY ANGIOGRAM;  Surgeon: Sinclair Grooms, MD;  Location: [facility] CATH LAB;  Service: Cardiovascular;  Laterality: N/A;  .  LOWER EXTREMITY ANGIOGRAM Left 05/2005   L pop-pop BPG patent  . NASAL SEPTUM SURGERY  1977  . PENILE PROSTHESIS IMPLANT  1990's  . SAVORY DILATION N/A 09/28/2015   Procedure: SAVORY DILATION;  Surgeon: Clarene Essex, MD;  Location: [facility] ENDOSCOPY;  Service: Endoscopy;  Laterality: N/A;  have balloons available  . SCROTAL SURGERY  08/1999   pump revision/notes 07/11/2010  . TOTAL KNEE ARTHROPLASTY Left 01/2009    Family History  Problem Relation Age of Onset  . Diabetes Father 52       deceased  . Peripheral vascular disease Father   . Pneumonia Father    . Alzheimer's disease Mother 74       deceased  . Fibromyalgia Sister 76       deceased  . Hypertension Son   . Obesity Daughter   . Diabetes Daughter    Social History:  reports that he has been smoking Cigarettes.  He has a 14.75 pack-year smoking history. He has quit using smokeless tobacco. His smokeless tobacco use included Chew. He reports that he does not drink alcohol or use drugs.  Allergies:  Allergies  Allergen Reactions  . Lipitor [Atorvastatin] Other (See Comments)    Muscle weakness  . Other     Antibiotic (pt unsure of name-will find out for [place]) - severe rash, itching and hives  . Sulfa Antibiotics Hives  . Sulfacetamide Sodium Hives  . Vytorin [Ezetimibe-Simvastatin] Other (See Comments)    Body aches    No prescriptions prior to admission.    No results found for this or any previous visit (from the past 48 hour(s)).  No results found.   Pertinent items are noted in HPI.  Height 5\' 9"  (1.753 m), weight 104.3 kg (230 lb).  General appearance: alert, cooperative and appears stated age Head: Normocephalic, without obvious abnormality, asymmetric shape Neck: no JVD Resp: clear to auscultation bilaterally Cardio: regular rate and rhythm, S1, S2 normal, no murmur, click, rub or gallop GI: soft, non-tender; bowel sounds normal; no masses,  no organomegaly Extremities: painful pincer nail right index Pulses: 2+ and symmetric Skin: Skin color, texture, turgor normal. No rashes or lesions Neurologic: Grossly normal Incision/Wound: na  Assessment/Plan Assessment:  1. Pincer nail deformity    Plan: Discussed various treatment alternatives for the pincer nail with him including removal of the nail plate responding the lateral nail matrix folds. Versus ablation of the nail with skin graft. He he is elected to proceed to have this skin grafted with a ablation of the nail. He is well aware of risks including recurrence of nail horns. Possibility loss of the  graft. The amount of time for healing. He would like to delay this until after his vacation. This can be scheduled for outpatient under regional anesthesia with a graft from the upper inner arm. Encouraged and answered to his satisfaction.       [name] [name] 09/17/2016, 6:49 AM

## 2016-09-17 NOTE — Anesthesia Procedure Notes (Signed)
Procedure Name: MAC Date/Time: 09/17/2016 10:54 AM Performed by: Miah Boye D Pre-anesthesia Checklist: Patient identified, Emergency Drugs available, Suction available, Patient being monitored and Timeout performed Patient Re-evaluated:Patient Re-evaluated prior to induction Oxygen Delivery Method: Simple face mask

## 2016-09-18 ENCOUNTER — Encounter (HOSPITAL_BASED_OUTPATIENT_CLINIC_OR_DEPARTMENT_OTHER): Payer: Self-pay | Admitting: Orthopedic Surgery

## 2017-01-15 ENCOUNTER — Encounter: Payer: Self-pay | Admitting: Podiatry

## 2017-01-15 ENCOUNTER — Ambulatory Visit: Payer: Medicare Other | Admitting: Podiatry

## 2017-01-15 VITALS — BP 125/62 | HR 78

## 2017-01-15 DIAGNOSIS — M79674 Pain in right toe(s): Secondary | ICD-10-CM

## 2017-01-15 DIAGNOSIS — M79675 Pain in left toe(s): Secondary | ICD-10-CM | POA: Diagnosis not present

## 2017-01-15 DIAGNOSIS — B351 Tinea unguium: Secondary | ICD-10-CM | POA: Diagnosis not present

## 2017-01-20 NOTE — Progress Notes (Signed)
Subjective:    Patient ID: [name] [name], male   DOB: 76 y.o.   MRN: [id number]   HPI [name] [name] presents the office today for concerns of thick, painful, elongated toenails that he could not from himself.  He denies any open sores.  Denies any redness or swelling or drainage from the toenail sites.  States the nails cause pain with pressure and with in shoes.  He has no other complaints today.   Review of Systems  All other systems reviewed and are negative.  Past Medical History:  Diagnosis Date  . Adenomatous polyp 2007  . Arthritis    "my body"  . B12 deficiency   . BPH (benign prostatic hyperplasia)   . Chronic back pain    "all over"  . Chronic low back pain   . Coronary artery disease 2009  . DDD (degenerative disc disease), cervical   . DDD (degenerative disc disease), lumbosacral   . DDD (degenerative disc disease), thoracic   . Depression   . Diabetes mellitus without complication ([place])    Type II  . Gastroesophageal reflux   . Gynecomastia 06/2009  . Hyperglycemia   . Hyperlipidemia LDL goal < 70   . MI (myocardial infarction) ([place]) 08/1989; 1993  . OSA on CPAP    "have mask; don't wear it" (02/02/2014) 09/25/15- now has a BiPAp- doesn'tt use it  . Peptic ulcer disease 1959  . Right bundle branch block   . Shortness of breath dyspnea   . Skin cancer    "burned/froze off my back" (02/02/2014)  . Tobacco abuse   . Vertebral compression fracture ([place]) 1970    Past Surgical History:  Procedure Laterality Date  . BYPASS GRAFT POPLITEAL TO POPLITEAL Left 06/2005   Dr Donnetta Hutching  . BYPASS GRAFT POPLITEAL TO POPLITEAL Right 02/2001   Dr Deon Pilling  . CARDIAC CATHETERIZATION  1991; 1993; 08/2012; 11/2013; 02/02/2014   I've had 6-7 total  . CARDIAC CATHETERIZATION  02/02/2014   Procedure: LEFT HEART CATH AND CORONARY ANGIOGRAPHY;  Surgeon: Jettie Booze, MD;  mild disease in LAD/CFX, RCA w/ CTO, unsuccessfull PCI both w/ antegrade and retrograde approaches   . CATARACT EXTRACTION  W/ INTRAOCULAR LENS  IMPLANT, BILATERAL Bilateral ~ 2012  . COLONOSCOPY W/ POLYPECTOMY    . CORONARY ANGIOPLASTY  1990's  . ESOPHAGOGASTRODUODENOSCOPY (EGD) WITH ESOPHAGEAL DILATION  11/2007  . ESOPHAGOGASTRODUODENOSCOPY (EGD) WITH PROPOFOL N/A 09/28/2015   Procedure: ESOPHAGOGASTRODUODENOSCOPY (EGD) WITH PROPOFOL;  Surgeon: Clarene Essex, MD;  Location[facility] ENDOSCOPY;  Service: Endoscopy;  Laterality: N/A;  . EYE SURGERY Bilateral   . JOINT REPLACEMENT    . LEFT HEART CATHETERIZATION WITH CORONARY ANGIOGRAM N/A 09/03/2012   Procedure: LEFT HEART CATHETERIZATION WITH CORONARY ANGIOGRAM;  Surgeon: Sinclair Grooms, MD;  Location: [facility] CATH LAB;  Service: Cardiovascular;  Laterality: N/A;  . LEFT HEART CATHETERIZATION WITH CORONARY ANGIOGRAM N/A 12/01/2013   Procedure: LEFT HEART CATHETERIZATION WITH CORONARY ANGIOGRAM;  Surgeon: Sinclair Grooms, MD;  Location: [facility] CATH LAB;  Service: Cardiovascular;  Laterality: N/A;  . LOWER EXTREMITY ANGIOGRAM Left 05/2005   L pop-pop BPG patent  . NAILBED REPAIR Right 09/17/2016   Procedure: ABLATION NAILBED RIGHT INDEX;  Surgeon: Daryll Brod, MD;  Location: [place];  Service: Orthopedics;  Laterality: Right;  . NASAL SEPTUM SURGERY  1977  . PENILE PROSTHESIS IMPLANT  1990's  . SAVORY DILATION N/A 09/28/2015   Procedure: SAVORY DILATION;  Surgeon: Clarene Essex, MD;  Location: [facility] ENDOSCOPY;  Service: Endoscopy;  Laterality: N/A;  have balloons available  . SCROTAL SURGERY  08/1999   pump revision/notes 07/11/2010  . SKIN FULL THICKNESS GRAFT Right 09/17/2016   Procedure: SKIN GRAFT FROM UPPER ARM;  Surgeon: Daryll Brod, MD;  Location: [place];  Service: Orthopedics;  Laterality: Right;  . TOTAL KNEE ARTHROPLASTY Left 01/2009     Current Outpatient Medications:  .  aspirin EC 81 MG tablet, Take 1 tablet (81 mg total) by mouth daily. Resume after 4 days., Disp: , Rfl:  .  buPROPion (WELLBUTRIN XL) 300 MG 24 hr tablet, Take 300 mg by  mouth daily., Disp: , Rfl:  .  cetirizine (ZYRTEC) 10 MG tablet, Take 10 mg by mouth daily as needed for allergies. , Disp: , Rfl: 11 .  cyanocobalamin (,VITAMIN B-12,) 1000 MCG/ML injection, Inject 1,000 mcg into the muscle every 30 (thirty) days. No specific day of the month. Administered by daughter., Disp: , Rfl:  .  docusate sodium (COLACE) 100 MG capsule, Take 100 mg by mouth 2 (two) times daily as needed for mild constipation., Disp: , Rfl:  .  HYDROcodone-acetaminophen (NORCO) 5-325 MG tablet, Take 1 tablet by mouth every 6 (six) hours as needed., Disp: 20 tablet, Rfl: 0 .  HYDROcodone-acetaminophen (NORCO/VICODIN) 5-325 MG tablet, Take 1 tablet by mouth every 6 (six) hours as needed for severe pain., Disp: 30 tablet, Rfl: 0 .  isosorbide mononitrate (IMDUR) 60 MG 24 hr tablet, TAKE 1 TABLET BY MOUTH ONCE DAILY, Disp: 30 tablet, Rfl: 11 .  Melatonin 2.5 MG CHEW, Chew 2.5 mg by mouth at bedtime as needed (sleep.)., Disp: , Rfl:  .  metFORMIN (GLUCOPHAGE-XR) 500 MG 24 hr tablet, Take 500 mg by mouth daily with breakfast., Disp: , Rfl:  .  metoprolol tartrate (LOPRESSOR) 50 MG tablet, Take 50 mg by mouth 2 (two) times daily., Disp: , Rfl:  .  nitroGLYCERIN (NITROSTAT) 0.4 MG SL tablet, Place 1 tablet (0.4 mg total) under the tongue every 5 (five) minutes as needed for chest pain., Disp: 100 tablet, Rfl: 11 .  nitroGLYCERIN (NITROSTAT) 0.4 MG SL tablet, Place 0.4 mg under the tongue 3 (three) times daily. 30 minutes prior to meals for esophageal spasms., Disp: , Rfl:  .  pantoprazole (PROTONIX) 40 MG tablet, Take 40 mg by mouth daily., Disp: , Rfl:  .  rosuvastatin (CRESTOR) 10 MG tablet, Take 10 mg by mouth daily., Disp: , Rfl:  .  tamsulosin (FLOMAX) 0.4 MG CAPS capsule, Take 0.4 mg by mouth daily. , Disp: , Rfl:   Allergies  Allergen Reactions  . Lipitor [Atorvastatin] Other (See Comments)    Muscle weakness  . Other     Antibiotic (pt unsure of name-will find out for [place]) - severe rash,  itching and hives  . Sulfa Antibiotics Hives  . Sulfacetamide Sodium Hives  . Vytorin [Ezetimibe-Simvastatin] Other (See Comments)    Body aches    Social History   Socioeconomic History  . Marital status: Widowed    Spouse name: Not on file  . Number of children: Not on file  . Years of education: Not on file  . Highest education level: Not on file  Social Needs  . Financial resource strain: Not on file  . Food insecurity - worry: Not on file  . Food insecurity - inability: Not on file  . Transportation needs - medical: Not on file  . Transportation needs - non-medical: Not on file  Occupational History  . Not  on file  Tobacco Use  . Smoking status: Current Some Day Smoker    Packs/day: 0.25    Years: 59.00    Pack years: 14.75    Types: Cigarettes    Last attempt to quit: 08/25/2012    Years since quitting: 4.4  . Smokeless tobacco: Former Systems developer    Types: Chew  . Tobacco comment: Smokes 1- 2 cigs a day since wife died 07/15/15  Substance and Sexual Activity  . Alcohol use: No    Alcohol/week: 0.0 oz  . Drug use: No  . Sexual activity: Not on file  Other Topics Concern  . Not on file  Social History Narrative   Tobacco use cigarettes: former smoker, quit in year 2014. Pack-year Hx: 19, tobacco history last updated 05/12/2013, additional findings: tobacco user moderate cigarette smoker (10-19 cigs/day), additional findings: tobacco Non-User current non-smoker, no smoking. No alcohol, stopped 1981. 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 Marital status: married. 4 children.         Objective:  Physical Exam General: AAO x3, NAD  Dermatological: Nails are hypertrophic, dystrophic, brittle, discolored, elongated 10. No surrounding redness or drainage. Tenderness nails 1-5 bilaterally. No open lesions or pre-ulcerative lesions are identified today.  Vascular: Dorsalis Pedis artery and Posterior Tibial artery pedal  pulses are 2/4 bilateral with immedate capillary fill time.  There is no pain with calf compression, swelling, warmth, erythema.   Neruologic: Grossly intact via light touch bilateral.  Protective threshold with Semmes Wienstein monofilament intact to all pedal sites bilateral.   Musculoskeletal: No pain, crepitus, or limitation noted with foot and ankle range of motion bilateral. Muscular strength 5/5 in all groups tested bilateral.  Gait: Unassisted, Nonantalgic.      Assessment:     Symptomatic onychomycosis    Plan:  -Treatment options discussed including all alternatives, risks, and complications -Etiology of symptoms were discussed -Nails debrided 10 without complications or bleeding. -Daily foot inspection -Follow-up in 3 months or sooner if any problems arise. In the meantime, encouraged to call the office with any questions, concerns, change in symptoms.   Celesta Gentile, DPM

## 2017-03-18 DIAGNOSIS — H903 Sensorineural hearing loss, bilateral: Secondary | ICD-10-CM | POA: Insufficient documentation

## 2017-03-18 DIAGNOSIS — H9313 Tinnitus, bilateral: Secondary | ICD-10-CM | POA: Insufficient documentation

## 2017-03-18 DIAGNOSIS — G4733 Obstructive sleep apnea (adult) (pediatric): Secondary | ICD-10-CM | POA: Insufficient documentation
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
## 2017-05-13 ENCOUNTER — Emergency Department: Payer: Medicare Other

## 2017-05-13 ENCOUNTER — Other Ambulatory Visit: Payer: Self-pay

## 2017-05-13 ENCOUNTER — Encounter: Payer: Self-pay | Admitting: Emergency Medicine

## 2017-05-13 ENCOUNTER — Emergency Department
Admission: EM | Admit: 2017-05-13 | Discharge: 2017-05-14 | Disposition: A | Discharge status: Home / Self Care | Payer: Medicare Other | Attending: Emergency Medicine | Admitting: Emergency Medicine

## 2017-05-13 DIAGNOSIS — Z7984 Long term (current) use of oral hypoglycemic drugs: Secondary | ICD-10-CM | POA: Insufficient documentation

## 2017-05-13 DIAGNOSIS — R079 Chest pain, unspecified: Secondary | ICD-10-CM | POA: Diagnosis not present

## 2017-05-13 DIAGNOSIS — K5900 Constipation, unspecified: Secondary | ICD-10-CM | POA: Diagnosis not present

## 2017-05-13 DIAGNOSIS — R112 Nausea with vomiting, unspecified: Secondary | ICD-10-CM | POA: Diagnosis not present

## 2017-05-13 DIAGNOSIS — F17210 Nicotine dependence, cigarettes, uncomplicated: Secondary | ICD-10-CM | POA: Insufficient documentation

## 2017-05-13 DIAGNOSIS — Z96652 Presence of left artificial knee joint: Secondary | ICD-10-CM | POA: Insufficient documentation

## 2017-05-13 DIAGNOSIS — E119 Type 2 diabetes mellitus without complications: Secondary | ICD-10-CM | POA: Insufficient documentation

## 2017-05-13 DIAGNOSIS — I252 Old myocardial infarction: Secondary | ICD-10-CM | POA: Insufficient documentation

## 2017-05-13 DIAGNOSIS — Z79899 Other long term (current) drug therapy: Secondary | ICD-10-CM | POA: Insufficient documentation

## 2017-05-13 DIAGNOSIS — Z7982 Long term (current) use of aspirin: Secondary | ICD-10-CM | POA: Diagnosis not present

## 2017-05-13 DIAGNOSIS — I2510 Atherosclerotic heart disease of native coronary artery without angina pectoris: Secondary | ICD-10-CM | POA: Insufficient documentation

## 2017-05-13 LAB — BASIC METABOLIC PANEL
ANION GAP: 9 (ref 5–15)
BUN: 23 mg/dL — ABNORMAL HIGH (ref 6–20)
CALCIUM: 8.8 mg/dL — AB (ref 8.9–10.3)
CO2: 26 mmol/L (ref 22–32)
Chloride: 101 mmol/L (ref 101–111)
Creatinine, Ser: 0.88 mg/dL (ref 0.61–1.24)
GFR calc Af Amer: >60 mL/min (ref >60)
Glucose, Bld: 145 mg/dL — ABNORMAL HIGH (ref 65–99)
Potassium: 4.1 mmol/L (ref 3.5–5.1)
Sodium: 136 mmol/L (ref 135–145)

## 2017-05-13 LAB — CBC
HCT: 47 % (ref 40.0–52.0)
HEMOGLOBIN: 15.8 g/dL (ref 13.0–18.0)
MCH: 30.7 pg (ref 26.0–34.0)
MCHC: 33.6 g/dL (ref 32.0–36.0)
MCV: 91.6 fL (ref 80.0–100.0)
Platelets: 166 10*3/uL (ref 150–440)
RBC: 5.13 MIL/uL (ref 4.40–5.90)
RDW: 14.2 % (ref 11.5–14.5)
WBC: 7.6 10*3/uL (ref 3.8–10.6)

## 2017-05-13 LAB — TROPONIN I

## 2017-05-13 NOTE — ED Triage Notes (Addendum)
Pt arrived to the ED accompanied by his wife for complaints of having a tick bite and vomiting. Pt reports that the vomiting started after the removal of the tick . Pt is AOx4 in no apparent distress with no redness noticed on the site of the bite.   During the triage process the Pt reported that he has been experiencing chest pain accompanied by vomiting. Pt states that he has an extensive cardiac history. Pt is AOx4 in no apparent distress.

## 2017-05-14 ENCOUNTER — Encounter: Payer: Self-pay | Admitting: Radiology

## 2017-05-14 ENCOUNTER — Emergency Department: Payer: Medicare Other

## 2017-05-14 LAB — URINALYSIS, COMPLETE (UACMP) WITH MICROSCOPIC
BACTERIA UA: NONE SEEN
BILIRUBIN URINE: NEGATIVE
Glucose, UA: NEGATIVE mg/dL
Hgb urine dipstick: NEGATIVE
KETONES UR: 5 mg/dL — AB
Nitrite: NEGATIVE
PH: 5 (ref 5.0–8.0)
Protein, ur: NEGATIVE mg/dL

## 2017-05-14 LAB — TROPONIN I

## 2017-05-14 MED ORDER — SODIUM CHLORIDE 0.9 % IV BOLUS (SEPSIS)
1000.0000 mL | Freq: Once | INTRAVENOUS | Status: AC
  Administered 2017-05-14: 1000 mL via INTRAVENOUS

## 2017-05-14 MED ORDER — IOPAMIDOL (ISOVUE-300) INJECTION 61%
100.0000 mL | Freq: Once | INTRAVENOUS | Status: AC | PRN
  Administered 2017-05-14: 100 mL via INTRAVENOUS

## 2017-05-14 MED ORDER — POLYETHYLENE GLYCOL 3350 17 G PO PACK: 17.0000 g | PACK | Freq: Every day | ORAL | 0 refills | Status: DC

## 2017-05-14 MED ORDER — ONDANSETRON 4 MG PO TBDP: 4.0000 mg | ORAL_TABLET | Freq: Three times a day (TID) | ORAL | 0 refills | Status: DC | PRN

## 2017-05-14 MED ORDER — ONDANSETRON HCL 4 MG/2ML IJ SOLN
4.0000 mg | Freq: Once | INTRAMUSCULAR | Status: AC
  Administered 2017-05-14: 4 mg via INTRAVENOUS
  Filled 2017-05-14: qty 2
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
## 2017-05-14 NOTE — Discharge Instructions (Signed)
Please follow up with your primary care physician for further evaluation of your vomiting. Please return with any other concern or any worsening complaints

## 2017-05-14 NOTE — ED Notes (Signed)
Patient able to tolerate PO fluid.

## 2017-08-19 NOTE — Progress Notes (Signed)
Cardiology Office Note    Date:  08/20/2017   ID:  [name] [name] April 20, 1940, MRN [id number]  PCP:  [name] Manes, MD  Cardiologist: [name] Grooms, MD   Chief Complaint  Patient presents with  . Coronary Artery Disease    History of Present Illness:  [name] [name] is a 77 y.o. male  follow-up of COPD, CAD with chronic total occlusion of RCA, essential hypertension, hyperlipidemia, obesity, obstructive sleep apnea, and diabetes mellitus with CKD  Exertional dyspnea and chest tightness.  No significant change since prior.  Still smoking cigarettes.  Denies chest pain at rest.  With ambulation, he is limited by lower extremity discomfort and dyspnea.  Has chronic cough every morning.  According to his daughter, there has been decreased hearing, excessive fatigue, difficulty with anosmia, and muscle pain.  Still smoking unfiltered Pall Mall's.   Past Medical History:  Diagnosis Date  . Adenomatous polyp 2007  . Arthritis    "my body"  . B12 deficiency   . BPH (benign prostatic hyperplasia)   . Chronic back pain    "all over"  . Chronic low back pain   . Coronary artery disease 2009  . DDD (degenerative disc disease), cervical   . DDD (degenerative disc disease), lumbosacral   . DDD (degenerative disc disease), thoracic   . Depression   . Diabetes mellitus without complication ([place])    Type II  . Gastroesophageal reflux   . Gynecomastia 06/2009  . Hyperglycemia   . Hyperlipidemia LDL goal < 70   . MI (myocardial infarction) ([place]) 08/1989; 1993  . OSA on CPAP    "have mask; don't wear it" (02/02/2014) 09/25/15- now has a BiPAp- doesn'tt use it  . Peptic ulcer disease 1959  . Right bundle branch block   . Shortness of breath dyspnea   . Tobacco abuse   . Vertebral compression fracture ([place]) 1970    Past Surgical History:  Procedure Laterality Date  . BYPASS GRAFT POPLITEAL TO POPLITEAL Left 06/2005   Dr Donnetta Hutching  . BYPASS GRAFT POPLITEAL TO POPLITEAL Right 02/2001     Dr Deon Pilling  . CARDIAC CATHETERIZATION  1991; 1993; 08/2012; 11/2013; 02/02/2014   I've had 6-7 total  . CARDIAC CATHETERIZATION  02/02/2014   Procedure: LEFT HEART CATH AND CORONARY ANGIOGRAPHY;  Surgeon: Jettie Booze, MD;  mild disease in LAD/CFX, RCA w/ CTO, unsuccessfull PCI both w/ antegrade and retrograde approaches   . CATARACT EXTRACTION W/ INTRAOCULAR LENS  IMPLANT, BILATERAL Bilateral ~ 2012  . COLONOSCOPY W/ POLYPECTOMY    . CORONARY ANGIOPLASTY  1990's  . ESOPHAGOGASTRODUODENOSCOPY (EGD) WITH ESOPHAGEAL DILATION  11/2007  . ESOPHAGOGASTRODUODENOSCOPY (EGD) WITH PROPOFOL N/A 09/28/2015   Procedure: ESOPHAGOGASTRODUODENOSCOPY (EGD) WITH PROPOFOL;  Surgeon: Clarene Essex, MD;  Location[facility] ENDOSCOPY;  Service: Endoscopy;  Laterality: N/A;  . EYE SURGERY Bilateral   . JOINT REPLACEMENT    . LEFT HEART CATHETERIZATION WITH CORONARY ANGIOGRAM N/A 09/03/2012   Procedure: LEFT HEART CATHETERIZATION WITH CORONARY ANGIOGRAM;  Surgeon: [name] Grooms, MD;  Location: [facility] CATH LAB;  Service: Cardiovascular;  Laterality: N/A;  . LEFT HEART CATHETERIZATION WITH CORONARY ANGIOGRAM N/A 12/01/2013   Procedure: LEFT HEART CATHETERIZATION WITH CORONARY ANGIOGRAM;  Surgeon: [name] Grooms, MD;  Location: [facility] CATH LAB;  Service: Cardiovascular;  Laterality: N/A;  . LOWER EXTREMITY ANGIOGRAM Left 05/2005   L pop-pop BPG patent  . NAILBED REPAIR Right 09/17/2016   Procedure: ABLATION NAILBED RIGHT INDEX;  Surgeon:  Daryll Brod, MD;  Location: [place];  Service: Orthopedics;  Laterality: Right;  . NASAL SEPTUM SURGERY  1977  . PENILE PROSTHESIS IMPLANT  1990's  . SAVORY DILATION N/A 09/28/2015   Procedure: SAVORY DILATION;  Surgeon: Clarene Essex, MD;  Location: [facility] ENDOSCOPY;  Service: Endoscopy;  Laterality: N/A;  have balloons available  . SCROTAL SURGERY  08/1999   pump revision/notes 07/11/2010  . SKIN FULL THICKNESS GRAFT Right 09/17/2016   Procedure: SKIN GRAFT FROM UPPER ARM;  Surgeon:  Daryll Brod, MD;  Location: [place];  Service: Orthopedics;  Laterality: Right;  . TOTAL KNEE ARTHROPLASTY Left 01/2009    Current Medications: Outpatient Medications Prior to Visit  Medication Sig Dispense Refill  . aspirin EC 81 MG tablet Take 1 tablet (81 mg total) by mouth daily. Resume after 4 days.    Marland Kitchen buPROPion (WELLBUTRIN) 100 MG tablet Take 100 mg by mouth 2 (two) times daily.    . cetirizine (ZYRTEC) 10 MG tablet Take 10 mg by mouth daily as needed for allergies.   11  . cholecalciferol (VITAMIN D) 1000 units tablet Take 2,000 Units by mouth daily.    . cyanocobalamin (,VITAMIN B-12,) 1000 MCG/ML injection Inject 1,000 mcg into the muscle every 30 (thirty) days. No specific day of the month. Administered by daughter.    . docusate sodium (COLACE) 100 MG capsule Take 100 mg by mouth 2 (two) times daily as needed for mild constipation.    Marland Kitchen HYDROcodone-acetaminophen (NORCO/VICODIN) 5-325 MG tablet Take 1 tablet by mouth every 6 (six) hours as needed for severe pain. 30 tablet 0  . isosorbide mononitrate (IMDUR) 60 MG 24 hr tablet TAKE 1 TABLET BY MOUTH ONCE DAILY 30 tablet 11  . Melatonin 2.5 MG CHEW Chew 2.5 mg by mouth at bedtime as needed (sleep.).    Marland Kitchen metoprolol tartrate (LOPRESSOR) 50 MG tablet Take 50 mg by mouth 2 (two) times daily.    . nitroGLYCERIN (NITROSTAT) 0.4 MG SL tablet Place 1 tablet (0.4 mg total) under the tongue every 5 (five) minutes as needed for chest pain. 100 tablet 11  . pantoprazole (PROTONIX) 40 MG tablet Take 40 mg by mouth daily.    . rosuvastatin (CRESTOR) 10 MG tablet Take 10 mg by mouth daily.    . tamsulosin (FLOMAX) 0.4 MG CAPS capsule Take 0.4 mg by mouth daily.     . metFORMIN (GLUCOPHAGE-XR) 500 MG 24 hr tablet Take 500 mg by mouth daily with breakfast.    . buPROPion (WELLBUTRIN XL) 300 MG 24 hr tablet Take 300 mg by mouth daily.    Marland Kitchen HYDROcodone-acetaminophen (NORCO) 5-325 MG tablet Take 1 tablet by mouth every 6 (six) hours  as needed. 20 tablet 0  . nitroGLYCERIN (NITROSTAT) 0.4 MG SL tablet Place 0.4 mg under the tongue 3 (three) times daily. 30 minutes prior to meals for esophageal spasms.    . ondansetron (ZOFRAN ODT) 4 MG disintegrating tablet Take 1 tablet (4 mg total) by mouth every 8 (eight) hours as needed for nausea or vomiting. 20 tablet 0  . polyethylene glycol (MIRALAX) packet Take 17 g by mouth daily. 14 each 0   No facility-administered medications prior to visit.      Allergies:   Doxycycline; Lipitor [atorvastatin]; Other; Sulfa antibiotics; Sulfacetamide sodium; and Vytorin [ezetimibe-simvastatin]   Social History   Socioeconomic History  . Marital status: Widowed    Spouse name: Not on file  . Number of children: Not on file  .  Years of education: Not on file  . Highest education level: Not on file  Occupational History  . Not on file  Social Needs  . Financial resource strain: Not on file  . Food insecurity:    Worry: Not on file    Inability: Not on file  . Transportation needs:    Medical: Not on file    Non-medical: Not on file  Tobacco Use  . Smoking status: Current Some Day Smoker    Packs/day: 0.25    Years: 59.00    Pack years: 14.75    Types: Cigarettes    Last attempt to quit: 08/25/2012    Years since quitting: 4.9  . Smokeless tobacco: Former Systems developer    Types: Chew  . Tobacco comment: Smokes 1- 2 cigs a day since wife died 07-24-15  Substance and Sexual Activity  . Alcohol use: No    Alcohol/week: 0.0 oz  . Drug use: No  . Sexual activity: Not on file  Lifestyle  . Physical activity:    Days per week: Not on file    Minutes per session: Not on file  . Stress: Not on file  Relationships  . Social connections:    Talks on phone: Not on file    Gets together: Not on file    Attends religious service: Not on file    Active member of club or organization: Not on file    Attends meetings of clubs or organizations: Not on file    Relationship status: Not on file    Other Topics Concern  . Not on file  Social History Narrative   Tobacco use cigarettes: former smoker, quit in year 2014. Pack-year Hx: 2, tobacco history last updated 05/12/2013, additional findings: tobacco user moderate cigarette smoker (10-19 cigs/day), additional findings: tobacco Non-User current non-smoker, no smoking. No alcohol, stopped 1981. 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 Marital status: married. 4 children.     Family History:  The patient's family history includes Alzheimer's disease (age of onset: 6) in his mother; Diabetes in his daughter; Diabetes (age of onset: 52) in his father; Fibromyalgia (age of onset: 45) in his sister; Hypertension in his son; Obesity in his daughter; Peripheral vascular disease in his father; Pneumonia in his father.   ROS:   Please see the history of present illness.    Excessive daytime sleepiness, snores according to his daughter.  Complains of hearing loss, chest pressure, excessive fatigue, change in appetite, and joint swelling. All other systems reviewed and are negative.   PHYSICAL EXAM:   VS:  BP (!) 100/56   Pulse (!) 54   Ht 5\' 4"  (1.626 m)   Wt 224 lb (101.6 kg)   SpO2 98%   BMI 38.45 kg/m    GEN: Well nourished, well developed, in no acute distress  HEENT: normal  Neck: Left carotid bruit. Cardiac: RRR; no murmurs, rubs, or gallops,no edema  Respiratory:  clear to auscultation bilaterally, normal work of breathing GI: soft, nontender, nondistended, + BS MS: no deformity or atrophy  Skin: warm and dry, no rash Neuro:  Alert and Oriented x 3, Strength and sensation are intact Psych: euthymic mood, full affect  Wt Readings from Last 3 Encounters:  08/20/17 224 lb (101.6 kg)  05/13/17 231 lb (104.8 kg)  09/17/16 222 lb 2 oz (100.8 kg)      Studies/Labs Reviewed:   EKG:  EKG personal review of the EKG performed on 05/14/2017  reveals right bundle, vertical  axis, sinus rhythm.  Recent Labs: 05/13/2017: BUN 23; Creatinine, Ser 0.88; Hemoglobin 15.8; Platelets 166; Potassium 4.1; Sodium 136   Lipid Panel    Component Value Date/Time   CHOL 100 09/04/2012 0515   TRIG 136 09/04/2012 0515   HDL 31 (L) 09/04/2012 0515   CHOLHDL 3.2 09/04/2012 0515   VLDL 27 09/04/2012 0515   LDLCALC 42 09/04/2012 0515    Additional studies/ records that were reviewed today include:  No new functional data.    ASSESSMENT:    1. Coronary artery disease involving native coronary artery of native heart with angina pectoris ([place])   2. Hyperlipidemia with target LDL less than 70   3. Right bundle branch block   4. Essential hypertension   5. Left carotid bruit   6. Aortic atherosclerosis ([place])   7. Tobacco abuse   8. Pulmonary emphysema, unspecified emphysema type ([place])      PLAN:  In order of problems listed above:  1. Continue risk factor modification and add Ranexa 500 mg twice daily.  Generally speaking ischemia symptoms are stable but limiting in addition to dyspnea associated with COPD and lower extremity limitations. 2. LDL is in excellent control when last evaluated in November and was 44.  Continue current therapy. 3. No change when last evaluated. 4. Excellent blood pressure control, limiting ability to uptitrate anti-ischemic therapy. 5. With new faint left carotid bruit, and known widespread atherosclerosis, will perform bilateral carotid Doppler.    Medication Adjustments/Labs and Tests Ordered: Current medicines are reviewed at length with the patient today.  Concerns regarding medicines are outlined above.  Medication changes, Labs and Tests ordered today are listed in the Patient Instructions below. Patient Instructions  Medication Instructions:  1) START Ranexa 500mg  twice daily  Labwork: None  Testing/Procedures: Your physician has requested that you have a carotid duplex. This test is an ultrasound of the carotid arteries in  your neck. It looks at blood flow through these arteries that supply the brain with blood. Allow one hour for this exam. There are no restrictions or special instructions.   Follow-Up: Your physician wants you to follow-up in: 1 year with Dr. [name].  You will receive a reminder letter in the mail two months in advance. If you don't receive a letter, please call our office to schedule the follow-up appointment.   Any Other Special Instructions Will Be Listed Below (If Applicable).     If you need a refill on your cardiac medications before your next appointment, please call your pharmacy.      Signed, [name] Grooms, MD  08/20/2017 8:38 AM    [address], [place], [place]  [address] Phone: [phone]; Fax: [phone]

## 2017-08-20 ENCOUNTER — Other Ambulatory Visit: Payer: Self-pay | Admitting: Physician Assistant

## 2017-08-20 ENCOUNTER — Encounter: Payer: Self-pay | Admitting: Interventional Cardiology

## 2017-08-20 ENCOUNTER — Ambulatory Visit: Payer: Medicare Other | Admitting: Interventional Cardiology

## 2017-08-20 VITALS — BP 100/56 | HR 54 | Ht 64.0 in | Wt 224.0 lb

## 2017-08-20 DIAGNOSIS — R1310 Dysphagia, unspecified: Secondary | ICD-10-CM

## 2017-08-20 DIAGNOSIS — I10 Essential (primary) hypertension: Secondary | ICD-10-CM | POA: Diagnosis not present

## 2017-08-20 DIAGNOSIS — R1319 Other dysphagia: Secondary | ICD-10-CM

## 2017-08-20 DIAGNOSIS — E785 Hyperlipidemia, unspecified: Secondary | ICD-10-CM

## 2017-08-20 DIAGNOSIS — I25119 Atherosclerotic heart disease of native coronary artery with unspecified angina pectoris: Secondary | ICD-10-CM

## 2017-08-20 DIAGNOSIS — Z720 Tobacco use: Secondary | ICD-10-CM

## 2017-08-20 DIAGNOSIS — R0989 Other specified symptoms and signs involving the circulatory and respiratory systems: Secondary | ICD-10-CM | POA: Diagnosis not present

## 2017-08-20 DIAGNOSIS — J439 Emphysema, unspecified: Secondary | ICD-10-CM | POA: Diagnosis not present

## 2017-08-20 DIAGNOSIS — I4510 Unspecified right bundle-branch block: Secondary | ICD-10-CM

## 2017-08-20 DIAGNOSIS — J449 Chronic obstructive pulmonary disease, unspecified: Secondary | ICD-10-CM | POA: Insufficient documentation

## 2017-08-20 DIAGNOSIS — I700 Atherosclerosis of aorta: Secondary | ICD-10-CM | POA: Diagnosis not present

## 2017-08-20 DIAGNOSIS — K224 Dyskinesia of esophagus: Secondary | ICD-10-CM

## 2017-08-20 DIAGNOSIS — Z8719 Personal history of other diseases of the digestive system: Secondary | ICD-10-CM

## 2017-08-20 MED ORDER — RANOLAZINE ER 500 MG PO TB12: 500.0000 mg | ORAL_TABLET | Freq: Two times a day (BID) | ORAL | 11 refills | Status: DC

## 2017-08-20 NOTE — Patient Instructions (Signed)
Medication Instructions:  1) START Ranexa 500mg  twice daily  Labwork: None  Testing/Procedures: Your physician has requested that you have a carotid duplex. This test is an ultrasound of the carotid arteries in your neck. It looks at blood flow through these arteries that supply the brain with blood. Allow one hour for this exam. There are no restrictions or special instructions.   Follow-Up: Your physician wants you to follow-up in: 1 year with Dr. [name].  You will receive a reminder letter in the mail two months in advance. If you don't receive a letter, please call our office to schedule the follow-up appointment.   Any Other Special Instructions Will Be Listed Below (If Applicable).     If you need a refill on your cardiac medications before your next appointment, please call your pharmacy.

## 2017-08-21 ENCOUNTER — Other Ambulatory Visit: Payer: Self-pay | Admitting: *Deleted

## 2017-08-21 ENCOUNTER — Telehealth: Payer: Self-pay | Admitting: Interventional Cardiology

## 2017-08-21 MED ORDER — RANOLAZINE ER 500 MG PO TB12: 500.0000 mg | ORAL_TABLET | Freq: Two times a day (BID) | ORAL | 11 refills | Status: DC

## 2017-08-21 NOTE — Telephone Encounter (Signed)
Called [name] but was unable to reach her. Called and spoke with patient and he states that the rx was sent to [facility] which he no longer uses, he now uses [facility]. I apologized for this and made him aware that I would send the rx to [facility] as requested. He verbalized understanding and appreciation.

## 2017-08-21 NOTE — Telephone Encounter (Signed)
New message     *STAT* If patient is at the pharmacy, call can be transferred to refill team.   1. Which medications need to be refilled? (please list name of each medication and dose if known) ranolazine (RANEXA) 500 MG 12 hr tablet  2. Which pharmacy/location (including street and city if local pharmacy) is medication to be sent to? [facility] [address]  3. Do they need a 30 day or 90 day supply? [place]

## 2017-08-26 ENCOUNTER — Ambulatory Visit
Admission: RE | Admit: 2017-08-26 | Discharge: 2017-08-26 | Disposition: A | Discharge status: Home / Self Care | Payer: Medicare Other | Source: Ambulatory Visit | Attending: Physician Assistant | Admitting: Physician Assistant

## 2017-08-26 DIAGNOSIS — K224 Dyskinesia of esophagus: Secondary | ICD-10-CM

## 2017-08-26 DIAGNOSIS — R1310 Dysphagia, unspecified: Secondary | ICD-10-CM

## 2017-08-26 DIAGNOSIS — Z8719 Personal history of other diseases of the digestive system: Secondary | ICD-10-CM

## 2017-08-26 DIAGNOSIS — R1319 Other dysphagia: Secondary | ICD-10-CM

## 2017-08-27 ENCOUNTER — Ambulatory Visit (HOSPITAL_COMMUNITY)
Admission: RE | Admit: 2017-08-27 | Discharge: 2017-08-27 | Disposition: A | Discharge status: Home / Self Care | Payer: Medicare Other | Source: Ambulatory Visit | Attending: Cardiology | Admitting: Cardiology

## 2017-08-27 DIAGNOSIS — R0989 Other specified symptoms and signs involving the circulatory and respiratory systems: Secondary | ICD-10-CM | POA: Diagnosis not present

## 2017-08-27 DIAGNOSIS — I10 Essential (primary) hypertension: Secondary | ICD-10-CM | POA: Insufficient documentation

## 2017-08-27 DIAGNOSIS — I2510 Atherosclerotic heart disease of native coronary artery without angina pectoris: Secondary | ICD-10-CM | POA: Insufficient documentation

## 2017-08-27 DIAGNOSIS — E785 Hyperlipidemia, unspecified: Secondary | ICD-10-CM | POA: Insufficient documentation

## 2017-08-27 DIAGNOSIS — I6523 Occlusion and stenosis of bilateral carotid arteries: Secondary | ICD-10-CM | POA: Insufficient documentation

## 2017-08-27 DIAGNOSIS — F17200 Nicotine dependence, unspecified, uncomplicated: Secondary | ICD-10-CM | POA: Diagnosis not present

## 2017-09-02 ENCOUNTER — Encounter (HOSPITAL_COMMUNITY): Payer: Medicare Other

## 2017-09-04 ENCOUNTER — Other Ambulatory Visit: Payer: Self-pay | Admitting: Gastroenterology

## 2017-09-04 ENCOUNTER — Encounter (HOSPITAL_COMMUNITY): Payer: Self-pay | Admitting: *Deleted

## 2017-09-05 ENCOUNTER — Other Ambulatory Visit: Payer: Self-pay | Admitting: Gastroenterology

## 2017-09-09 ENCOUNTER — Encounter (HOSPITAL_COMMUNITY): Payer: Self-pay | Admitting: *Deleted

## 2017-09-09 ENCOUNTER — Encounter (HOSPITAL_COMMUNITY)
Admission: RE | Disposition: A | Discharge status: Home / Self Care | Payer: Self-pay | Source: Ambulatory Visit | Attending: Gastroenterology

## 2017-09-09 ENCOUNTER — Ambulatory Visit (HOSPITAL_COMMUNITY): Payer: Medicare Other | Admitting: Anesthesiology

## 2017-09-09 ENCOUNTER — Other Ambulatory Visit: Payer: Self-pay

## 2017-09-09 ENCOUNTER — Ambulatory Visit (HOSPITAL_COMMUNITY)
Admission: RE | Admit: 2017-09-09 | Discharge: 2017-09-09 | Disposition: A | Discharge status: Home / Self Care | Payer: Medicare Other | Source: Ambulatory Visit | Attending: Gastroenterology | Admitting: Gastroenterology

## 2017-09-09 DIAGNOSIS — Z9841 Cataract extraction status, right eye: Secondary | ICD-10-CM | POA: Insufficient documentation

## 2017-09-09 DIAGNOSIS — N400 Enlarged prostate without lower urinary tract symptoms: Secondary | ICD-10-CM | POA: Insufficient documentation

## 2017-09-09 DIAGNOSIS — Z79899 Other long term (current) drug therapy: Secondary | ICD-10-CM | POA: Insufficient documentation

## 2017-09-09 DIAGNOSIS — K219 Gastro-esophageal reflux disease without esophagitis: Secondary | ICD-10-CM | POA: Insufficient documentation

## 2017-09-09 DIAGNOSIS — Z881 Allergy status to other antibiotic agents status: Secondary | ICD-10-CM | POA: Insufficient documentation

## 2017-09-09 DIAGNOSIS — G8929 Other chronic pain: Secondary | ICD-10-CM | POA: Insufficient documentation

## 2017-09-09 DIAGNOSIS — E119 Type 2 diabetes mellitus without complications: Secondary | ICD-10-CM | POA: Insufficient documentation

## 2017-09-09 DIAGNOSIS — Z9842 Cataract extraction status, left eye: Secondary | ICD-10-CM | POA: Diagnosis not present

## 2017-09-09 DIAGNOSIS — Z833 Family history of diabetes mellitus: Secondary | ICD-10-CM | POA: Insufficient documentation

## 2017-09-09 DIAGNOSIS — E669 Obesity, unspecified: Secondary | ICD-10-CM | POA: Diagnosis not present

## 2017-09-09 DIAGNOSIS — F329 Major depressive disorder, single episode, unspecified: Secondary | ICD-10-CM | POA: Diagnosis not present

## 2017-09-09 DIAGNOSIS — Z882 Allergy status to sulfonamides status: Secondary | ICD-10-CM | POA: Insufficient documentation

## 2017-09-09 DIAGNOSIS — Z7982 Long term (current) use of aspirin: Secondary | ICD-10-CM | POA: Insufficient documentation

## 2017-09-09 DIAGNOSIS — K2950 Unspecified chronic gastritis without bleeding: Secondary | ICD-10-CM | POA: Diagnosis not present

## 2017-09-09 DIAGNOSIS — K449 Diaphragmatic hernia without obstruction or gangrene: Secondary | ICD-10-CM | POA: Diagnosis not present

## 2017-09-09 DIAGNOSIS — I739 Peripheral vascular disease, unspecified: Secondary | ICD-10-CM | POA: Diagnosis not present

## 2017-09-09 DIAGNOSIS — I252 Old myocardial infarction: Secondary | ICD-10-CM | POA: Diagnosis not present

## 2017-09-09 DIAGNOSIS — M1990 Unspecified osteoarthritis, unspecified site: Secondary | ICD-10-CM | POA: Insufficient documentation

## 2017-09-09 DIAGNOSIS — Z820 Family history of epilepsy and other diseases of the nervous system: Secondary | ICD-10-CM | POA: Insufficient documentation

## 2017-09-09 DIAGNOSIS — Z8711 Personal history of peptic ulcer disease: Secondary | ICD-10-CM | POA: Insufficient documentation

## 2017-09-09 DIAGNOSIS — E7800 Pure hypercholesterolemia, unspecified: Secondary | ICD-10-CM | POA: Diagnosis not present

## 2017-09-09 DIAGNOSIS — Z8269 Family history of other diseases of the musculoskeletal system and connective tissue: Secondary | ICD-10-CM | POA: Insufficient documentation

## 2017-09-09 DIAGNOSIS — E538 Deficiency of other specified B group vitamins: Secondary | ICD-10-CM | POA: Diagnosis not present

## 2017-09-09 DIAGNOSIS — Z86010 Personal history of colonic polyps: Secondary | ICD-10-CM | POA: Insufficient documentation

## 2017-09-09 DIAGNOSIS — Z888 Allergy status to other drugs, medicaments and biological substances status: Secondary | ICD-10-CM | POA: Insufficient documentation

## 2017-09-09 DIAGNOSIS — G4730 Sleep apnea, unspecified: Secondary | ICD-10-CM | POA: Diagnosis not present

## 2017-09-09 DIAGNOSIS — I25119 Atherosclerotic heart disease of native coronary artery with unspecified angina pectoris: Secondary | ICD-10-CM | POA: Diagnosis not present

## 2017-09-09 DIAGNOSIS — F17210 Nicotine dependence, cigarettes, uncomplicated: Secondary | ICD-10-CM | POA: Insufficient documentation

## 2017-09-09 DIAGNOSIS — M545 Low back pain: Secondary | ICD-10-CM | POA: Insufficient documentation

## 2017-09-09 DIAGNOSIS — J449 Chronic obstructive pulmonary disease, unspecified: Secondary | ICD-10-CM | POA: Diagnosis not present

## 2017-09-09 DIAGNOSIS — I4510 Unspecified right bundle-branch block: Secondary | ICD-10-CM | POA: Insufficient documentation

## 2017-09-09 DIAGNOSIS — K222 Esophageal obstruction: Secondary | ICD-10-CM

## 2017-09-09 DIAGNOSIS — Z8249 Family history of ischemic heart disease and other diseases of the circulatory system: Secondary | ICD-10-CM | POA: Insufficient documentation

## 2017-09-09 DIAGNOSIS — Z6833 Body mass index (BMI) 33.0-33.9, adult: Secondary | ICD-10-CM | POA: Diagnosis not present

## 2017-09-09 DIAGNOSIS — R1310 Dysphagia, unspecified: Secondary | ICD-10-CM | POA: Insufficient documentation

## 2017-09-09 HISTORY — PX: ESOPHAGOGASTRODUODENOSCOPY (EGD) WITH PROPOFOL: SHX5813

## 2017-09-09 HISTORY — PX: SAVORY DILATION: SHX5439

## 2017-09-09 SURGERY — ESOPHAGOGASTRODUODENOSCOPY (EGD) WITH PROPOFOL
Anesthesia: Monitor Anesthesia Care

## 2017-09-09 MED ORDER — LIDOCAINE HCL (CARDIAC) PF 100 MG/5ML IV SOSY
PREFILLED_SYRINGE | INTRAVENOUS | Status: DC | PRN
  Administered 2017-09-09: 80 mg via INTRAVENOUS

## 2017-09-09 MED ORDER — SODIUM CHLORIDE 0.9 % IV SOLN: INTRAVENOUS | Status: DC

## 2017-09-09 MED ORDER — LACTATED RINGERS IV SOLN
INTRAVENOUS | Status: DC
  Administered 2017-09-09: 1000 mL via INTRAVENOUS

## 2017-09-09 MED ORDER — GLYCOPYRROLATE 0.2 MG/ML IJ SOLN
INTRAMUSCULAR | Status: DC | PRN
  Administered 2017-09-09: 0.2 mg via INTRAVENOUS

## 2017-09-09 MED ORDER — PROPOFOL 500 MG/50ML IV EMUL
INTRAVENOUS | Status: DC | PRN
  Administered 2017-09-09: 100 ug/kg/min via INTRAVENOUS

## 2017-09-09 MED ORDER — ONABOTULINUMTOXINA 100 UNITS IJ SOLR
INTRAMUSCULAR | Status: AC
  Filled 2017-09-09: qty 100

## 2017-09-09 MED ORDER — PROPOFOL 500 MG/50ML IV EMUL
INTRAVENOUS | Status: DC | PRN
  Administered 2017-09-09: 50 mg via INTRAVENOUS

## 2017-09-09 MED ORDER — SODIUM CHLORIDE 0.9 % IJ SOLN
INTRAMUSCULAR | Status: AC
  Filled 2017-09-09: qty 10

## 2017-09-09 SURGICAL SUPPLY — 15 items

## 2017-09-09 NOTE — Anesthesia Postprocedure Evaluation (Signed)
Anesthesia Post Note  Patient: [name] [name]  Procedure(s) Performed: ESOPHAGOGASTRODUODENOSCOPY (EGD) WITH PROPOFOL (N/A ) SAVORY DILATION (N/A )     Patient location during evaluation: PACU Anesthesia Type: MAC Level of consciousness: awake and alert Pain management: pain level controlled Vital Signs Assessment: post-procedure vital signs reviewed and stable Respiratory status: spontaneous breathing Cardiovascular status: stable Anesthetic complications: no    Last Vitals:  Vitals:   09/09/17 1020 09/09/17 1030  BP: 94/79 (!) 103/57  Pulse: (!) 57 (!) 55  Resp: (!) 21 20  Temp:    SpO2: 98% 94%    Last Pain:  Vitals:   09/09/17 1030  TempSrc:   PainSc: 0-No pain                 Nolon Nations

## 2017-09-09 NOTE — Anesthesia Preprocedure Evaluation (Signed)
Anesthesia Evaluation  Patient identified by MRN, date of birth, ID band Patient awake    Reviewed: Allergy & Precautions, NPO status , Patient's Chart, lab work & pertinent test results, Unable to perform ROS - Chart review only  Airway Mallampati: II  TM Distance: >3 FB Neck ROM: Full    Dental  (+) Teeth Intact   Pulmonary shortness of breath, sleep apnea , COPD, Current Smoker,    breath sounds clear to auscultation       Cardiovascular hypertension, + angina + CAD, + Past MI and + Peripheral Vascular Disease  + dysrhythmias  Rhythm:Regular Rate:Normal     Neuro/Psych    GI/Hepatic PUD, GERD  Controlled,  Endo/Other  diabetes, Well Controlled  Renal/GU      Musculoskeletal  (+) Arthritis ,   Abdominal (+) + obese,   Peds  Hematology   Anesthesia Other Findings   Reproductive/Obstetrics                             Anesthesia Physical  Anesthesia Plan  ASA: III  Anesthesia Plan: MAC   Post-op Pain Management:    Induction: Intravenous  PONV Risk Score and Plan: 0 and Propofol infusion and TIVA  Airway Management Planned: Natural Airway and Nasal Cannula  Additional Equipment:   Intra-op Plan:   Post-operative Plan:   Informed Consent: I have reviewed the patients History and Physical, chart, labs and discussed the procedure including the risks, benefits and alternatives for the proposed anesthesia with the patient or authorized representative who has indicated his/her understanding and acceptance.   Dental advisory given  Plan Discussed with: CRNA  Anesthesia Plan Comments:         Anesthesia Quick Evaluation

## 2017-09-09 NOTE — Discharge Instructions (Signed)
YOU HAD AN ENDOSCOPIC PROCEDURE TODAY: Refer to the procedure report and other information in the discharge instructions given to you for any specific questions about what was found during the examination. If this information does not answer your questions, please call [facility] office at [phone] to clarify.   YOU SHOULD EXPECT: Some feelings of bloating in the abdomen. Passage of more gas than usual. Walking can help get rid of the air that was put into your GI tract during the procedure and reduce the bloating.  DIET: Your first meal following the procedure should be a light meal and then it is ok to progress to your normal diet. A half-sandwich or bowl of soup is an example of a good first meal. Heavy or fried foods are harder to digest and may make you feel nauseous or bloated. Drink plenty of fluids but you should avoid alcoholic beverages for 24 hours. If you had a esophageal dilation, please see attached instructions for diet.   ACTIVITY: Your care partner should take you home directly after the procedure. You should plan to take it easy, moving slowly for the rest of the day. You can resume normal activity the day after the procedure however YOU SHOULD NOT DRIVE, use power tools, machinery or perform tasks that involve climbing or major physical exertion for 24 hours (because of the sedation medicines used during the test).   SYMPTOMS TO REPORT IMMEDIATELY: A gastroenterologist can be reached at any hour. Please call [phone]  for any of the following symptoms:   Following upper endoscopy (EGD, EUS, ERCP, esophageal dilation) Vomiting of blood or coffee ground material  New, significant abdominal pain  New, significant chest pain or pain under the shoulder blades  Painful or persistently difficult swallowing  New shortness of breath  Black, tarry-looking or red, bloody stools  FOLLOW UP:  If any biopsies were taken you will be contacted by phone or by letter within the next 1-3  weeks. Call [phone]  if you have not heard about the biopsies in 3 weeks.  Please also call with any specific questions about appointments or follow up tests. Liquids only for 2 hoursand if okay soft solids today and advance diet tomorrow and call if question or problem or swallowing no better in a few weeks otherwise follow-up in a few months

## 2017-09-09 NOTE — Op Note (Signed)
[facility] Patient Name: [name] [name] Procedure Date: 09/09/2017 MRN: [id number] Attending MD: Clarene Essex , MD Date of Birth: [birth year] CSN: [id number] Age: 77 Admit Type: Outpatient Procedure:                Upper GI endoscopy Indications:              Dysphagia Providers:                Clarene Essex, MDAutumn Sela Hua, RN, Laurena Spies, Technician, Arnoldo Hooker, CRNA Referring MD:              Medicines:                Propofol total dose 143 mg IV, 80 mg IV lidocaine Complications:            No immediate complications. Estimated Blood Loss:     Estimated blood loss: none. Procedure:                Pre-Anesthesia Assessment:                           - Prior to the procedure, a History and Physical                            was performed, and patient medications and                            allergies were reviewed. The patient's tolerance of                            previous anesthesia was also reviewed. The risks                            and benefits of the procedure and the sedation                            options and risks were discussed with the patient.                            All questions were answered, and informed consent                            was obtained. Prior Anticoagulants: The patient has                            taken aspirin, last dose was day of procedure. ASA                            Grade Assessment: II - A patient with mild systemic                            disease. After reviewing the risks and benefits,  the patient was deemed in satisfactory condition to                            undergo the procedure.                           After obtaining informed consent, the endoscope was                            passed under direct vision. Throughout the                            procedure, the patient's blood pressure, pulse, and                            oxygen  saturations were monitored continuously. The                            [id number] ) was introduced through the                            mouth, and advanced to the second part of duodenum.                            The upper GI endoscopy was accomplished without                            difficulty. The patient tolerated the procedure                            well. Scope In: Scope Out: Findings:      A tiny hiatal hernia was present.      I did not see a benign-appearing, intrinsic stenosis was found. The GE       junction was traversed. A guidewire was placed after the endoscopy was       completed and the scope was withdrawn. Dilation was performed with a       Savary dilator with no resistance and no heme at 16 mm.      Localized mild inflammation characterized by erythema was found in the       gastric antrum.      The duodenal bulb, first portion of the duodenum and second portion of       the duodenum were normal.      The exam was otherwise without abnormality. Impression:               - Tiny hiatal hernia.                           - No obvious Benign-appearing esophageal stenosis                            seen. Dilated to 16 mm as above. Probably he has                            esophageal dysmotility  and presbyesophagus                           - Chronic gastritis.                           - Normal duodenal bulb, first portion of the                            duodenum and second portion of the duodenum.                           - The examination was otherwise normal.                           - No specimens collected. Moderate Sedation:      N/A- Per Anesthesia Care Recommendation:           - Patient has a contact number available for                            emergencies. The signs and symptoms of potential                            delayed complications were discussed with the                            patient. Return to normal activities  tomorrow.                            Written discharge instructions were provided to the                            patient.                           - Advance diet as tolerated today.                           - Continue present medications.                           - Return to GI clinic in 3 months.                           - Telephone GI clinic if symptomatic PRN. Procedure Code(s):        --- Professional ---                           [phone], Esophagogastroduodenoscopy, flexible,                            transoral; with insertion of guide wire followed by                            passage of dilator(s) through esophagus over guide  wire Diagnosis Code(s):        --- Professional ---                           K44.9, Diaphragmatic hernia without obstruction or                            gangrene                           K22.2, Esophageal obstruction                           K29.50, Unspecified chronic gastritis without                            bleeding                           R13.10, Dysphagia, unspecified CPT copyright 2017 American Medical Association. All rights reserved. The codes documented in this report are preliminary and upon coder review may  be revised to meet current compliance requirements. Clarene Essex, MD 09/09/2017 10:13:05 AM This report has been signed electronically. Number of Addenda: 0

## 2017-09-09 NOTE — Progress Notes (Signed)
[name] [name] 9:10 AM  Subjective: Patient without any new complaints since we saw him recently in the office and we rediscussed his swallowing with he and his daughter and  and his previous dilations lasted about 3-6 months  Objective: Vital signs stable afebrile no acute distress exam please see preassessment evaluation  Assessment: Dysphagia abnormal barium swallow history of stricture and esophageal dysmotility  Plan: Okay to proceed with endoscopy with possible dilation or Botox with anesthesia assistance  [facility] E  Pager [phone] After 5PM or if no answer call [phone]

## 2017-09-09 NOTE — Transfer of Care (Signed)
Immediate Anesthesia Transfer of Care Note  Patient: [name] [name]  Procedure(s) Performed: Procedure(s) with comments: ESOPHAGOGASTRODUODENOSCOPY (EGD) WITH PROPOFOL (N/A) SAVORY DILATION (N/A) - possible botox injection  Patient Location: PACU  Anesthesia Type:MAC  Level of Consciousness:  sedated, patient cooperative and responds to stimulation  Airway & Oxygen Therapy:Patient Spontanous Breathing and Patient connected to face mask oxgen  Post-op Assessment:  Report given to PACU RN and Post -op Vital signs reviewed and stable  Post vital signs:  Reviewed and stable  Last Vitals:  Vitals:   09/09/17 0838  BP: (!) 151/92  Pulse: (!) 55  Resp: 17  Temp: (!) 36.4 C  SpO2: 10%    Complications: No apparent anesthesia complications

## 2017-09-22 ENCOUNTER — Other Ambulatory Visit: Payer: Self-pay | Admitting: Interventional Cardiology

## 2017-09-22 ENCOUNTER — Telehealth: Payer: Self-pay | Admitting: Interventional Cardiology

## 2017-09-22 DIAGNOSIS — I209 Angina pectoris, unspecified: Secondary | ICD-10-CM

## 2017-09-22 NOTE — Telephone Encounter (Signed)
Spoke with daughter, DPR on file.  She was asking to make sure that pt was to take Imdur and Ranexa.  Advised pt is to take both.  Daughter appreciative for call.

## 2017-09-22 NOTE — Telephone Encounter (Signed)
Pt's daughter is calling and wanting to know does her dad suppose to continue taking Isosorbide mon  60mg  1tablet once daily

## 2018-03-13 ENCOUNTER — Other Ambulatory Visit: Payer: Self-pay | Admitting: *Deleted

## 2018-03-13 MED ORDER — RANOLAZINE ER 500 MG PO TB12: 500.0000 mg | ORAL_TABLET | Freq: Two times a day (BID) | ORAL | 1 refills | Status: DC

## 2018-04-03 ENCOUNTER — Telehealth: Payer: Self-pay | Admitting: Interventional Cardiology

## 2018-04-03 MED ORDER — NITROGLYCERIN 0.4 MG SL SUBL
0.40 | SUBLINGUAL_TABLET | SUBLINGUAL | Status: DC
Start: ? — End: 2018-04-03

## 2018-04-03 NOTE — Telephone Encounter (Signed)
Spoke with daughter and made her aware ok to wait until Monday to see Dr. [name].  Advised if any changes this weekend to go back to the hospital.  Daughter verbalized understanding and was appreciative for call.

## 2018-04-03 NOTE — Telephone Encounter (Signed)
Patient's daughter called to advise [place] that she took her father to [facility] last night for Chest pain, everything checked out ok. They did an EKG, lab work, and X-Ray, and everything was normal. They discharged him home and advised him to follow up with his cardiologist.  She requested a follow up appt with Dr. [name], I scheduled one for Monday 2/10.  She wanted a note forwarded to Dr. [name] to make sure it was ok for him to wait until Monday to be seen.  She stated her father is doing fine right now and slept fine last night.

## 2018-04-06 ENCOUNTER — Ambulatory Visit: Payer: Medicare Other | Admitting: Interventional Cardiology

## 2018-04-06 ENCOUNTER — Encounter: Payer: Self-pay | Admitting: Interventional Cardiology

## 2018-04-06 ENCOUNTER — Encounter (INDEPENDENT_AMBULATORY_CARE_PROVIDER_SITE_OTHER): Payer: Self-pay

## 2018-04-06 VITALS — BP 92/58 | HR 55 | Ht 69.0 in | Wt 216.6 lb

## 2018-04-06 DIAGNOSIS — I4510 Unspecified right bundle-branch block: Secondary | ICD-10-CM

## 2018-04-06 DIAGNOSIS — E785 Hyperlipidemia, unspecified: Secondary | ICD-10-CM

## 2018-04-06 DIAGNOSIS — I25119 Atherosclerotic heart disease of native coronary artery with unspecified angina pectoris: Secondary | ICD-10-CM | POA: Diagnosis not present

## 2018-04-06 DIAGNOSIS — R0989 Other specified symptoms and signs involving the circulatory and respiratory systems: Secondary | ICD-10-CM

## 2018-04-06 DIAGNOSIS — Z720 Tobacco use: Secondary | ICD-10-CM

## 2018-04-06 DIAGNOSIS — I700 Atherosclerosis of aorta: Secondary | ICD-10-CM

## 2018-04-06 DIAGNOSIS — I10 Essential (primary) hypertension: Secondary | ICD-10-CM

## 2018-04-06 DIAGNOSIS — J439 Emphysema, unspecified: Secondary | ICD-10-CM

## 2018-04-06 NOTE — Patient Instructions (Addendum)
Medication Instructions:  Your provider recommends that you continue on your current medications as directed. Please refer to the Current Medication list given to you today.    Labwork: None  Testing/Procedures: None  Follow-Up: You have a follow-up appointment with Truitt Merle scheduled 08/03/18 at 3:00PM.

## 2018-04-06 NOTE — Progress Notes (Signed)
Cardiology Office Note:    Date:  04/06/2018   ID:  VANNA SHAVERS, DOB 26-Nov-1940, MRN [id number]  PCP:  [name], MD  Cardiologist:  No primary care provider on file.   Referring MD: [name], MD   Chief Complaint  Patient presents with  . Coronary Artery Disease  . Chest Pain    History of Present Illness:    [name] [name] is a 78 y.o. male with a hx of COPD, CAD with chronic total occlusion of RCA, essential hypertension, hyperlipidemia, obesity, obstructive sleep apnea, and diabetes mellitus with CKD.  The patient was seen in the emergency room at [facility] on February 7 after he developed focal sharp left sternal chest discomfort.  The discomfort had been present off and on for greater than 10 hours by the time he was seen.  ECGs were performed.  Cardiac ischemic markers were also evaluated.  Chest x-ray was performed.  All objective metrics were negative.  Despite the negative work-up it was recommended that he be admitted to the hospital for ischemic evaluation and he refused.  Since leaving the emergency room he has done better.  He has had 1-2 episodes of brief (lasting seconds) sharp chest discomfort.  The discomfort is nonexertional.  He cannot remember if it is similar to his angina or not.  Also noted is that the patient has not been on Ranexa now for quite some time.  The exact date is not known.  He was taking 500 mg twice daily.  He has had no chest discomfort today.  He has not tried nitroglycerin for the discomfort.  Overall he feels better now than he did a week ago.  Past Medical History:  Diagnosis Date  . Adenomatous polyp 2007  . Arthritis    "my body"  . B12 deficiency   . BPH (benign prostatic hyperplasia)   . Chronic back pain    "all over"  . Chronic low back pain   . Coronary artery disease 2009  . DDD (degenerative disc disease), cervical   . DDD (degenerative disc disease), lumbosacral   . DDD (degenerative disc  disease), thoracic   . Depression   . Diabetes mellitus without complication ([place])    Type II  . Gastroesophageal reflux   . Gynecomastia 06/2009  . Hyperglycemia   . Hyperlipidemia LDL goal < 70   . MI (myocardial infarction) ([place]) 08/1989; 1993  . OSA on CPAP    "have mask; don't wear it" (02/02/2014) 09/25/15- now has a BiPAp- doesn'tt use it  . Peptic ulcer disease 1959  . Right bundle branch block   . Shortness of breath dyspnea   . Tobacco abuse   . Vertebral compression fracture ([place]) 1970    Past Surgical History:  Procedure Laterality Date  . BYPASS GRAFT POPLITEAL TO POPLITEAL Left 06/2005   Dr Donnetta Hutching  . BYPASS GRAFT POPLITEAL TO POPLITEAL Right 02/2001   Dr Deon Pilling  . CARDIAC CATHETERIZATION  1991; 1993; 08/2012; 11/2013; 02/02/2014   I've had 6-7 total  . CARDIAC CATHETERIZATION  02/02/2014   Procedure: LEFT HEART CATH AND CORONARY ANGIOGRAPHY;  Surgeon: Jettie Booze, MD;  mild disease in LAD/CFX, RCA w/ CTO, unsuccessfull PCI both w/ antegrade and retrograde approaches   . CATARACT EXTRACTION W/ INTRAOCULAR LENS  IMPLANT, BILATERAL Bilateral ~ 2012  . COLONOSCOPY W/ POLYPECTOMY    . CORONARY ANGIOPLASTY  1990's  . ESOPHAGOGASTRODUODENOSCOPY (EGD) WITH ESOPHAGEAL DILATION  11/2007  . ESOPHAGOGASTRODUODENOSCOPY (EGD) WITH  PROPOFOL N/A 09/28/2015   Procedure: ESOPHAGOGASTRODUODENOSCOPY (EGD) WITH PROPOFOL;  Surgeon: Clarene Essex, MD;  [facility]COPY;  Service: Endoscopy;  Laterality: N/A;  . ESOPHAGOGASTRODUODENOSCOPY (EGD) WITH PROPOFOL N/A 09/09/2017   Procedure: ESOPHAGOGASTRODUODENOSCOPY (EGD) WITH PROPOFOL;  Surgeon: Clarene Essex, MD;  Location: [facility] ENDOSCOPY;  Service: Endoscopy;  Laterality: N/A;  . EYE SURGERY Bilateral   . JOINT REPLACEMENT    . LEFT HEART CATHETERIZATION WITH CORONARY ANGIOGRAM N/A 09/03/2012   Procedure: LEFT HEART CATHETERIZATION WITH CORONARY ANGIOGRAM;  Surgeon: Sinclair Grooms, MD;  Location: [facility] CATH LAB;  Service: Cardiovascular;   Laterality: N/A;  . LEFT HEART CATHETERIZATION WITH CORONARY ANGIOGRAM N/A 12/01/2013   Procedure: LEFT HEART CATHETERIZATION WITH CORONARY ANGIOGRAM;  Surgeon: Sinclair Grooms, MD;  Location: [facility] CATH LAB;  Service: Cardiovascular;  Laterality: N/A;  . LOWER EXTREMITY ANGIOGRAM Left 05/2005   L pop-pop BPG patent  . NAILBED REPAIR Right 09/17/2016   Procedure: ABLATION NAILBED RIGHT INDEX;  Surgeon: Daryll Brod, MD;  Location: [place];  Service: Orthopedics;  Laterality: Right;  . NASAL SEPTUM SURGERY  1977  . PENILE PROSTHESIS IMPLANT  1990's  . SAVORY DILATION N/A 09/28/2015   Procedure: SAVORY DILATION;  Surgeon: Clarene Essex, MD;  Location[facility] ENDOSCOPY;  Service: Endoscopy;  Laterality: N/A;  have balloons available  . SAVORY DILATION N/A 09/09/2017   Procedure: SAVORY DILATION;  Surgeon: Clarene Essex, MD;  Location: [facility] ENDOSCOPY;  Service: Endoscopy;  Laterality: N/A;  . SCROTAL SURGERY  08/1999   pump revision/notes 07/11/2010  . SKIN FULL THICKNESS GRAFT Right 09/17/2016   Procedure: SKIN GRAFT FROM UPPER ARM;  Surgeon: Daryll Brod, MD;  Location: [place];  Service: Orthopedics;  Laterality: Right;  . TOTAL KNEE ARTHROPLASTY Left 01/2009    Current Medications: Current Meds  Medication Sig  . aspirin EC 81 MG tablet Take 1 tablet (81 mg total) by mouth daily. Resume after 4 days.  Marland Kitchen buPROPion (WELLBUTRIN) 100 MG tablet Take 100 mg by mouth 2 (two) times daily.  . cholecalciferol (VITAMIN D) 1000 units tablet Take 2,000 Units by mouth daily.  . cyanocobalamin (,VITAMIN B-12,) 1000 MCG/ML injection Inject 1,000 mcg into the muscle every 30 (thirty) days. No specific day of the month. Administered by daughter.  . docusate sodium (COLACE) 100 MG capsule Take 100 mg by mouth 2 (two) times daily as needed for mild constipation.  Marland Kitchen HYDROcodone-acetaminophen (NORCO/VICODIN) 5-325 MG tablet Take 1 tablet by mouth every 6 (six) hours as needed for severe pain.  .  isosorbide mononitrate (IMDUR) 60 MG 24 hr tablet Take 1 tablet (60 mg total) by mouth daily.  . metoprolol tartrate (LOPRESSOR) 50 MG tablet Take 50 mg by mouth 2 (two) times daily.  . nitroGLYCERIN (NITROSTAT) 0.4 MG SL tablet Place 1 tablet (0.4 mg total) under the tongue every 5 (five) minutes as needed for chest pain.  . pantoprazole (PROTONIX) 40 MG tablet Take 40 mg by mouth daily.  . rosuvastatin (CRESTOR) 10 MG tablet Take 10 mg by mouth daily.  . tamsulosin (FLOMAX) 0.4 MG CAPS capsule Take 0.4 mg by mouth at bedtime.      Allergies:   Doxycycline; Lipitor [atorvastatin]; Other; Sulfa antibiotics; Sulfacetamide sodium; and Vytorin [ezetimibe-simvastatin]   Social History   Socioeconomic History  . Marital status: Widowed    Spouse name: Not on file  . Number of children: Not on file  . Years of education: Not on file  . Highest education  level: Not on file  Occupational History  . Not on file  Social Needs  . Financial resource strain: Not on file  . Food insecurity:    Worry: Not on file    Inability: Not on file  . Transportation needs:    Medical: Not on file    Non-medical: Not on file  Tobacco Use  . Smoking status: Current Some Day Smoker    Packs/day: 0.25    Years: 59.00    Pack years: 14.75    Types: Cigarettes    Last attempt to quit: 08/25/2012    Years since quitting: 5.6  . Smokeless tobacco: Former Systems developer    Types: Chew  . Tobacco comment: Smokes 1- 2 cigs a day since wife died 06/29/15  Substance and Sexual Activity  . Alcohol use: No    Alcohol/week: 0.0 standard drinks  . Drug use: No  . Sexual activity: Not on file  Lifestyle  . Physical activity:    Days per week: Not on file    Minutes per session: Not on file  . Stress: Not on file  Relationships  . Social connections:    Talks on phone: Not on file    Gets together: Not on file    Attends religious service: Not on file    Active member of club or organization: Not on file    Attends  meetings of clubs or organizations: Not on file    Relationship status: Not on file  Other Topics Concern  . Not on file  Social History Narrative   Tobacco use cigarettes: former smoker, quit in year 2014. Pack-year Hx: 69, tobacco history last updated 05/12/2013, additional findings: tobacco user moderate cigarette smoker (10-19 cigs/day), additional findings: tobacco Non-User current non-smoker, no smoking. No alcohol, stopped 1981. 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 Marital status: married. 4 children.     Family History: The patient's family history includes Alzheimer's disease (age of onset: 14) in his mother; Diabetes in his daughter; Diabetes (age of onset: 63) in his father; Fibromyalgia (age of onset: 29) in his sister; Hypertension in his son; Obesity in his daughter; Peripheral vascular disease in his father; Pneumonia in his father.  ROS:   Please see the history of present illness.    Chest pain, leg swelling, appetite change, skipped heartbeat, snoring, wheezing, constipation, nausea, vomiting, joint swelling, difficulty with balance.  Joint swelling, difficulty with balance, snoring, wheezing, and skipped heartbeats.  Still smoking until yesterday..  All other systems reviewed and are negative.  EKGs/Labs/Other Studies Reviewed:    The following studies were reviewed today:  Carotid Doppler assessment August 27, 2017:  Final Interpretation: Right Carotid: Velocities in the right ICA are consistent with a 1-39% stenosis.  Left Carotid: Velocities in the left ICA are consistent with a 1-39% stenosis.  Vertebrals:  Bilateral vertebral arteries demonstrate antegrade flow. Subclavians: Normal flow hemodynamics were seen in bilateral subclavian              arteries.    EKG:  EKG performed on 04/06/2018 demonstrates normal sinus rhythm with right bundle branch block.  When compared to prior tracings sniffing and  change is noted.  Recent Labs: 05/13/2017: BUN 23; Creatinine, Ser 0.88; Hemoglobin 15.8; Platelets 166; Potassium 4.1; Sodium 136  Recent Lipid Panel    Component Value Date/Time   CHOL 100 09/04/2012 0515   TRIG 136 09/04/2012 0515   HDL 31 (L) 09/04/2012 0515  CHOLHDL 3.2 09/04/2012 0515   VLDL 27 09/04/2012 0515   LDLCALC 42 09/04/2012 0515    Physical Exam:    VS:  BP (!) 92/58   Pulse (!) 55   Ht 5\' 9"  (1.753 m)   Wt 216 lb 9.6 oz (98.2 kg)   SpO2 95%   BMI 31.99 kg/m     Wt Readings from Last 3 Encounters:  04/06/18 216 lb 9.6 oz (98.2 kg)  09/09/17 230 lb (104.3 kg)  08/20/17 224 lb (101.6 kg)     GEN: Elderly.. No acute distress HEENT: Normal NECK: No JVD. LYMPHATICS: No lymphadenopathy CARDIAC: RRR.  No murmur, no gallop, no edema VASCULAR: 2+ bilateral radial and carotid bilateral  pulses, no bruits RESPIRATORY:  Clear to auscultation without rales, wheezing or rhonchi  ABDOMEN: Soft, non-tender, non-distended, No pulsatile mass, MUSCULOSKELETAL: No deformity  SKIN: Warm and dry NEUROLOGIC:  Alert and oriented x 3 PSYCHIATRIC:  Normal affect   ASSESSMENT:    1. Coronary artery disease involving native coronary artery of native heart with angina pectoris ([place])   2. Hyperlipidemia with target LDL less than 70   3. Right bundle branch block   4. Essential hypertension   5. Aortic atherosclerosis ([place])   6. Left carotid bruit   7. Pulmonary emphysema, unspecified emphysema type ([place])   8. Tobacco abuse    PLAN:    In order of problems listed above:  1. Stable without evidence of infarction and with recent chest pain having atypical qualities.  He was on suboptimal dose of Ranexa, 500 mg twice daily.  Now greater than 2 weeks without the medication he seems to be doing well and we have decided to leave Ranexa off of his medication regimen.  Continue close follow-up and observation. 2. Continue rosuvastatin 10 mg/day.  Most recent lipid panel revealed  LDL of 51 in November. 3. Unchanged from prior tracings 4. Blood pressure is relatively low.  He is asymptomatic.  No change in therapy. 5. Not addressed 6. Not addressed 7. States that he has stopped smoking starting last week.  So far so good. 8. Continue to abstain from cigarette smoking.  Stay off of Ranexa.  Clinical follow-up in 3 to 5 months with team member.  Clinical follow-up with me in 1 year or earlier if need be.   Medication Adjustments/Labs and Tests Ordered: Current medicines are reviewed at length with the patient today.  Concerns regarding medicines are outlined above.  Orders Placed This Encounter  Procedures  . EKG 12-Lead   No orders of the defined types were placed in this encounter.   Patient Instructions  Medication Instructions:  Your provider recommends that you continue on your current medications as directed. Please refer to the Current Medication list given to you today.    Labwork: None  Testing/Procedures: None  Follow-Up: You have a follow-up appointment with Truitt Merle scheduled 08/03/18 at 3:00PM.      Signed, Sinclair Grooms, MD  04/06/2018 3:50 PM    [place]

## 2018-07-28 ENCOUNTER — Telehealth: Payer: Self-pay

## 2018-07-28 NOTE — Telephone Encounter (Signed)
[phone] [name] would like someone to call her about her fathers visit taking place Monday 6/8. She says whenever an appt is scheduled it is to be done through her.

## 2018-07-28 NOTE — Telephone Encounter (Signed)
lvm did not see in pt's chart daughter was supposed to be notified of all appts and all appts made through daughter.  Stated to call office back in regard to phone call.

## 2018-07-29 NOTE — Telephone Encounter (Signed)
All appts, medication changes, and concerns are to go through daughter, Ruta Hinds @ [phone].  Also daughter is to be called after appt.      Virtual Visit Pre-Appointment Phone Call  "(Name), I am calling you today to discuss your upcoming appointment. We are currently trying to limit exposure to the virus that causes COVID-19 by seeing patients at home rather than in the office."  1. "What is the BEST phone number to call the day of the visit?" - include this in appointment notes  2. "Do you have or have access to (through a family member/friend) a smartphone with video capability that we can use for your visit?" a. If yes - list this number in appt notes as "cell" (if different from BEST phone #) and list the appointment type as a VIDEO visit in appointment notes b. If no - list the appointment type as a PHONE visit in appointment notes  3. Confirm consent - "In the setting of the current Covid19 crisis, you are scheduled for a (phone or video) visit with your provider on Monday, June 8) at (2:00 pm).  Just as we do with many in-office visits, in order for you to participate in this visit, we must obtain consent.  If you'd like, I can send this to your mychart (if signed up) or email for you to review.  Otherwise, I can obtain your verbal consent now.  All virtual visits are billed to your insurance company just like a normal visit would be.  By agreeing to a virtual visit, we'd like you to understand that the technology does not allow for your provider to perform an examination, and thus may limit your provider's ability to fully assess your condition. If your provider identifies any concerns that need to be evaluated in person, we will make arrangements to do so.  Finally, though the technology is pretty good, we cannot assure that it will always work on either your or our end, and in the setting of a video visit, we may have to convert it to a phone-only visit.  In either situation, we  cannot ensure that we have a secure connection.  Are you willing to proceed?" STAFF: Did the patient verbally acknowledge consent to telehealth visit? Document YES/NO here: YES.  4. Advise patient to be prepared - "Two hours prior to your appointment, go ahead and check your blood pressure, pulse, oxygen saturation, and your weight (if you have the equipment to check those) and write them all down. When your visit starts, your provider will ask you for this information. If you have an Apple Watch or Kardia device, please plan to have heart rate information ready on the day of your appointment. Please have a pen and paper handy nearby the day of the visit as well."  5. Give patient instructions for MyChart download to smartphone OR Doximity/Doxy.me as below if video visit (depending on what platform provider is using)  6. Inform patient they will receive a phone call 15 minutes prior to their appointment time (may be from unknown caller ID) so they should be prepared to answer    TELEPHONE CALL NOTE  [name] [name] has been deemed a candidate for a follow-up tele-health visit to limit community exposure during the Covid-19 pandemic. I spoke with the patient via phone to ensure availability of phone/video source, confirm preferred email & phone number, and discuss instructions and expectations.  I reminded [name] [name] to be prepared with any vital sign  and/or heart rhythm information that could potentially be obtained via home monitoring, at the time of his visit. I reminded [name] [name] to expect a phone call prior to his visit.  Danielle Avanell Shackleton 07/29/2018 10:50 AM   INSTRUCTIONS FOR DOWNLOADING THE MYCHART APP TO SMARTPHONE  - The patient must first make sure to have activated MyChart and know their login information - If Apple, go to [employer] and type in MyChart in the search bar and download the app. If Android, ask patient to go to [employer] and type in [place] in the search  bar and download the app. The app is free but as with any other app downloads, their phone may require them to verify saved payment information or Apple/Android password.  - The patient will need to then log into the app with their MyChart username and password, and select [facility] as their healthcare provider to link the account. When it is time for your visit, go to the MyChart app, find appointments, and click Begin Video Visit. Be sure to Select Allow for your device to access the Microphone and Camera for your visit. You will then be connected, and your provider will be with you shortly.  **If they have any issues connecting, or need assistance please contact MyChart service desk [phone])**  **If using a computer, in order to ensure the best quality for their visit they will need to use either of the following Internet Browsers: [employer], or Google Chrome**  IF USING DOXIMITY or DOXY.ME - The patient will receive a link just prior to their visit by text.     FULL LENGTH CONSENT FOR TELE-HEALTH VISIT   I hereby voluntarily request, consent and authorize [facility] and its employed or contracted physicians, physician assistants, nurse practitioners or other licensed health care professionals (the Practitioner), to provide me with telemedicine health care services (the "Services") as deemed necessary by the treating Practitioner. I acknowledge and consent to receive the Services by the Practitioner via telemedicine. I understand that the telemedicine visit will involve communicating with the Practitioner through live audiovisual communication technology and the disclosure of certain medical information by electronic transmission. I acknowledge that I have been given the opportunity to request an in-person assessment or other available alternative prior to the telemedicine visit and am voluntarily participating in the telemedicine visit.  I understand that I have the  right to withhold or withdraw my consent to the use of telemedicine in the course of my care at any time, without affecting my right to future care or treatment, and that the Practitioner or I may terminate the telemedicine visit at any time. I understand that I have the right to inspect all information obtained and/or recorded in the course of the telemedicine visit and may receive copies of available information for a reasonable fee.  I understand that some of the potential risks of receiving the Services via telemedicine include:  Marland Kitchen Delay or interruption in medical evaluation due to technological equipment failure or disruption; . Information transmitted may not be sufficient (e.g. poor resolution of images) to allow for appropriate medical decision making by the Practitioner; and/or  . In rare instances, security protocols could fail, causing a breach of personal health information.  Furthermore, I acknowledge that it is my responsibility to provide information about my medical history, conditions and care that is complete and accurate to the best of my ability. I acknowledge that Practitioner's advice, recommendations, and/or decision may be  based on factors not within their control, such as incomplete or inaccurate data provided by me or distortions of diagnostic images or specimens that may result from electronic transmissions. I understand that the practice of medicine is not an exact science and that Practitioner makes no warranties or guarantees regarding treatment outcomes. I acknowledge that I will receive a copy of this consent concurrently upon execution via email to the email address I last provided but may also request a printed copy by calling the office of [facility].    I understand that my insurance will be billed for this visit.   I have read or had this consent read to me. . I understand the contents of this consent, which adequately explains the benefits and risks of the Services  being provided via telemedicine.  . I have been provided ample opportunity to ask questions regarding this consent and the Services and have had my questions answered to my satisfaction. . I give my informed consent for the services to be provided through the use of telemedicine in my medical care  By participating in this telemedicine visit I agree to the above.

## 2018-07-31 NOTE — Progress Notes (Signed)
Telehealth Visit     Virtual Visit via Video Note   This visit type was conducted due to national recommendations for restrictions regarding the COVID-19 Pandemic (e.g. social distancing) in an effort to limit this patient's exposure and mitigate transmission in our community.  Due to his co-morbid illnesses, this patient is at least at moderate risk for complications without adequate follow up.  This format is felt to be most appropriate for this patient at this time.  All issues noted in this document were discussed and addressed.  A limited physical exam was performed with this format.  Please refer to the patient's chart for his consent to telehealth for [facility].   Evaluation Performed:  Follow-up visit  This visit type was conducted due to national recommendations for restrictions regarding the COVID-19 Pandemic (e.g. social distancing).  This format is felt to be most appropriate for this patient at this time.  All issues noted in this document were discussed and addressed.  No physical exam was performed (except for noted visual exam findings with Video Visits).  Please refer to the patient's chart (MyChart message for video visits and phone note for telephone visits) for the patient's consent to telehealth for [facility].  Date:  08/03/2018   ID:  [name] [name], DOB Jul 25, 1940, MRN [id number]  Patient Location:  Home  Provider location:   Home  PCP:  [name], MD  Cardiologist:  [facility] Electrophysiologist:  None   Chief Complaint:  Follow up.   History of Present Illness:    [name] [name] is a 78 y.o. male who presents via audio/video conferencing for a telehealth visit today.  Seen for Dr. [name].   He has a history of COPD, CAD with chronic total occlusion of RCA, essential hypertension, hyperlipidemia, obesity, obstructive sleep apnea, and diabetes mellitus with CKD. He has had issues with his memory.   Last seen in early February following an ER visit  at [facility] for focal sharp left sternal chest discomfort > 10 hrs.  Had negative ER evaluation but was recommended to be admitted - he refused - he had brief return of symptoms. He could not remember if this was similar to his prior angina.   At his last visit it was noted that the patient had not been on Ranexa now for quite some time.  The exact date was not known.  Previously was taking 500 mg twice daily. This was not restarted. He was doing well - medical therapy was continued.   The patient does not have symptoms concerning for COVID-19 infection (fever, chills, cough, or new shortness of breath).   Seen today by telephone conversation. He has consented for this visit. He does not have a MyChart account. No Internet or smartphone. He has been staying in with the quarantine. I will call his daughter after this conversation per her wishes to give an update/recall. He does report chest pain - comes and goes. He notes he is using NTG - taking 2 to 3 most days - and has been using NTG before each meal for quite some time. He notes the NTG prior to meals helps with his esophagus/swallowing. He will use NTG other times however for actual chest pain - typically occurs at rest but has had some with exertion.  He last took NTG on Saturday - while watching TV. Sounds like it remains as a "sharp" chest pain. He feels this discomfort is about the same as when he was  seen in February with Dr. [name]. He is not really active. No exercise. Has family that does his yard and house work. Seems limited by shortness of breath and then says he will have some chest pain.   I have called his daughter - [name] - she has been concerned about her father - she has noted more chest pain and more NTG use over the past several months. Patient is still smoking some. Sounds like Ranexa was cost prohibitive.    Past Medical History:  Diagnosis Date  . Adenomatous polyp 2007  . Arthritis    "my body"  .  B12 deficiency   . BPH (benign prostatic hyperplasia)   . Chronic back pain    "all over"  . Chronic low back pain   . Coronary artery disease 2009  . DDD (degenerative disc disease), cervical   . DDD (degenerative disc disease), lumbosacral   . DDD (degenerative disc disease), thoracic   . Depression   . Diabetes mellitus without complication ([place])    Type II  . Gastroesophageal reflux   . Gynecomastia 06/2009  . Hyperglycemia   . Hyperlipidemia LDL goal < 70   . MI (myocardial infarction) ([place]) 08/1989; 1993  . OSA on CPAP    "have mask; don't wear it" (02/02/2014) 09/25/15- now has a BiPAp- doesn'tt use it  . Peptic ulcer disease 1959  . Right bundle branch block   . Shortness of breath dyspnea   . Tobacco abuse   . Vertebral compression fracture ([place]) 1970   Past Surgical History:  Procedure Laterality Date  . BYPASS GRAFT POPLITEAL TO POPLITEAL Left 06/2005   Dr Donnetta Hutching  . BYPASS GRAFT POPLITEAL TO POPLITEAL Right 02/2001   Dr Deon Pilling  . CARDIAC CATHETERIZATION  1991; 1993; 08/2012; 11/2013; 02/02/2014   I've had 6-7 total  . CARDIAC CATHETERIZATION  02/02/2014   Procedure: LEFT HEART CATH AND CORONARY ANGIOGRAPHY;  Surgeon: Jettie Booze, MD;  mild disease in LAD/CFX, RCA w/ CTO, unsuccessfull PCI both w/ antegrade and retrograde approaches   . CATARACT EXTRACTION W/ INTRAOCULAR LENS  IMPLANT, BILATERAL Bilateral ~ 2012  . COLONOSCOPY W/ POLYPECTOMY    . CORONARY ANGIOPLASTY  1990's  . ESOPHAGOGASTRODUODENOSCOPY (EGD) WITH ESOPHAGEAL DILATION  11/2007  . ESOPHAGOGASTRODUODENOSCOPY (EGD) WITH PROPOFOL N/A 09/28/2015   Procedure: ESOPHAGOGASTRODUODENOSCOPY (EGD) WITH PROPOFOL;  Surgeon: Clarene Essex, MD;  [facility] ENDOSCOPY;  Service: Endoscopy;  Laterality: N/A;  . ESOPHAGOGASTRODUODENOSCOPY (EGD) WITH PROPOFOL N/A 09/09/2017   Procedure: ESOPHAGOGASTRODUODENOSCOPY (EGD) WITH PROPOFOL;  Surgeon: Clarene Essex, MD;  Location: [facility] ENDOSCOPY;  Service: Endoscopy;  Laterality: N/A;   . EYE SURGERY Bilateral   . JOINT REPLACEMENT    . LEFT HEART CATHETERIZATION WITH CORONARY ANGIOGRAM N/A 09/03/2012   Procedure: LEFT HEART CATHETERIZATION WITH CORONARY ANGIOGRAM;  Surgeon: Sinclair Grooms, MD;  Location: [facility] CATH LAB;  Service: Cardiovascular;  Laterality: N/A;  . LEFT HEART CATHETERIZATION WITH CORONARY ANGIOGRAM N/A 12/01/2013   Procedure: LEFT HEART CATHETERIZATION WITH CORONARY ANGIOGRAM;  Surgeon: Sinclair Grooms, MD;  Location: [facility] CATH LAB;  Service: Cardiovascular;  Laterality: N/A;  . LOWER EXTREMITY ANGIOGRAM Left 05/2005   L pop-pop BPG patent  . NAILBED REPAIR Right 09/17/2016   Procedure: ABLATION NAILBED RIGHT INDEX;  Surgeon: Daryll Brod, MD;  Location: [facility];  Service: Orthopedics;  Laterality: Right;  . NASAL SEPTUM SURGERY  1977  . PENILE PROSTHESIS IMPLANT  1990's  . SAVORY DILATION N/A 09/28/2015   Procedure: SAVORY  DILATION;  Surgeon: Clarene Essex, MD;  Location: [facility] ENDOSCOPY;  Service: Endoscopy;  Laterality: N/A;  have balloons available  . SAVORY DILATION N/A 09/09/2017   Procedure: SAVORY DILATION;  Surgeon: Clarene Essex, MD;  Location: [facility] ENDOSCOPY;  Service: Endoscopy;  Laterality: N/A;  . SCROTAL SURGERY  08/1999   pump revision/notes 07/11/2010  . SKIN FULL THICKNESS GRAFT Right 09/17/2016   Procedure: SKIN GRAFT FROM UPPER ARM;  Surgeon: Daryll Brod, MD;  Location: [facility];  Service: Orthopedics;  Laterality: Right;  . TOTAL KNEE ARTHROPLASTY Left 01/2009     Current Meds  Medication Sig  . aspirin EC 81 MG tablet Take 1 tablet (81 mg total) by mouth daily. Resume after 4 days.  Marland Kitchen buPROPion (WELLBUTRIN) 100 MG tablet Take 100 mg by mouth 2 (two) times daily.  . cholecalciferol (VITAMIN D) 1000 units tablet Take 2,000 Units by mouth daily.  . cyanocobalamin (,VITAMIN B-12,) 1000 MCG/ML injection Inject 1,000 mcg into the muscle every 30 (thirty) days. No specific day of the month. Administered by daughter.  .  docusate sodium (COLACE) 100 MG capsule Take 100 mg by mouth 2 (two) times daily as needed for mild constipation.  Marland Kitchen HYDROcodone-acetaminophen (NORCO/VICODIN) 5-325 MG tablet Take 1 tablet by mouth every 6 (six) hours as needed for severe pain.  . isosorbide mononitrate (IMDUR) 60 MG 24 hr tablet Take 1 tablet (60 mg total) by mouth daily.  . metoprolol tartrate (LOPRESSOR) 50 MG tablet Take 50 mg by mouth 2 (two) times daily.  . Multiple Vitamin (MULTIVITAMIN WITH MINERALS) TABS tablet Take 1 tablet by mouth daily.  . nitroGLYCERIN (NITROSTAT) 0.4 MG SL tablet Place 1 tablet (0.4 mg total) under the tongue every 5 (five) minutes as needed for chest pain.  . pantoprazole (PROTONIX) 40 MG tablet Take 40 mg by mouth daily.  . rosuvastatin (CRESTOR) 10 MG tablet Take 10 mg by mouth daily.  . tamsulosin (FLOMAX) 0.4 MG CAPS capsule Take 0.4 mg by mouth at bedtime.      Allergies:   Doxycycline; Lipitor [atorvastatin]; Other; Sulfa antibiotics; Sulfacetamide sodium; and Vytorin [ezetimibe-simvastatin]   Social History   Tobacco Use  . Smoking status: Current Some Day Smoker    Packs/day: 0.25    Years: 59.00    Pack years: 14.75    Types: Cigarettes    Last attempt to quit: 08/25/2012    Years since quitting: 5.9  . Smokeless tobacco: Former Systems developer    Types: Chew  . Tobacco comment: Smokes 1- 2 cigs a day since wife died 07/02/2015  Substance Use Topics  . Alcohol use: No    Alcohol/week: 0.0 standard drinks  . Drug use: No     Family Hx: The patient's family history includes Alzheimer's disease (age of onset: 35) in his mother; Diabetes in his daughter; Diabetes (age of onset: 25) in his father; Fibromyalgia (age of onset: 46) in his sister; Hypertension in his son; Obesity in his daughter; Peripheral vascular disease in his father; Pneumonia in his father.  ROS:   Please see the history of present illness.   All other systems reviewed are negative.    Objective:    Vital Signs:  BP (!)  140/56   Ht 5\' 9"  (1.753 m)   BMI 31.99 kg/m    Wt Readings from Last 3 Encounters:  04/06/18 216 lb 9.6 oz (98.2 kg)  09/09/17 230 lb (104.3 kg)  08/20/17 224 lb (101.6 kg)    Alert male in  no acute distress. Not short of breath.    Labs/Other Tests and Data Reviewed:    Lab Results  Component Value Date   WBC 7.6 05/13/2017   HGB 15.8 05/13/2017   HCT 47.0 05/13/2017   PLT 166 05/13/2017   GLUCOSE 145 (H) 05/13/2017   CHOL 100 09/04/2012   TRIG 136 09/04/2012   HDL 31 (L) 09/04/2012   LDLCALC 42 09/04/2012   ALT 17 09/03/2012   AST 20 09/03/2012   NA 136 05/13/2017   K 4.1 05/13/2017   CL 101 05/13/2017   CREATININE 0.88 05/13/2017   BUN 23 (H) 05/13/2017   CO2 26 05/13/2017   INR 1.05 02/02/2014     BNP (last 3 results) No results for input(s): BNP in the last 8760 hours.  ProBNP (last 3 results) No results for input(s): PROBNP in the last 8760 hours.       Prior CV studies:    The following studies were reviewed today:  Carotid Doppler assessment August 27, 2017: Final Interpretation: Right Carotid: Velocities in the right ICA are consistent with a 1-39% stenosis.  Left Carotid: Velocities in the left ICA are consistent with a 1-39% stenosis.  Vertebrals: Bilateral vertebral arteries demonstrate antegrade flow. Subclavians: Normal flow hemodynamics were seen in bilateral subclavianarteries.   CARDIAC CATH IMPRESSIONS 11/2013: 1. Chronic total occlusion of the RCA which is well collateralized from the left circumflex and the LAD cell perforators. 2. Widely patent RCA and circumflex coronary arteries 3. Diastolic heart failure with LVEDP 28 mmHg 4. Class III angina pectoris   RECOMMENDATION:  Intensify medical therapy. Decrease LVEDP to hopefully improve angina via the [place]. We'll double beta blocker to metoprolol 50 mg twice a day; change to isosorbide mononitrate 60 mg per day; add low-dose diuretic therapy.  Will refer to the  CT oh team, [name].  PCI/CTO IMPRESSIONS 11/2013:  1. Unsuccessful attempt at CTO PCI of the RCA. Both antegrade and retrograde approaches were attempted. 2.   LVEDP 12 mmHg.    RECOMMENDATION:  Intensify medical therapy as tolerated for angina. If symptoms cannot be controlled, would have to consider using the epicardial collateral from the circumflex to get into the right. This would be a procedure with significantly more risk of perforation of the collateral. Hopefully, we can try to control his angina with other therapy.     ASSESSMENT & PLAN:    1.  CAD - totally occluded RCA - remote unsuccessful attempt at CTO in 2015 - managed medically - sounds like he is having more chest pain - possible angina - will increase his Imdur today to 90 mg - needs to be seen in the office with EKG - daughter could bring him on Friday. Appointment is made.   2. HLD - on statin - last LDL was 51 by [facility] from November 2019.   3. RBBB  4. HTN - BP is fair. Recheck in the office on Friday. Nitrate increased today.   5. Carotid disease - last study from 2019 noted.   6. Tobacco abuse - he is still smoking some. Total cessation encouraged.   7. COVID-19 Education: The signs and symptoms of COVID-19 were discussed with the patient and how to seek care for testing (follow up with PCP or arrange E-visit).  The importance of social distancing, staying at home, hand hygiene and wearing a mask when out in public were discussed today. He is screened today with our phone conversation and is negative.  Patient Risk:   After full review of this patient's clinical status, I feel that they are at least moderate risk at this time.  Time:   Today, I have spent 12 minutes with the patient and then his daughter with telehealth technology discussing the above issues.     Medication Adjustments/Labs and Tests Ordered: Current medicines are reviewed at length with the patient today.  Concerns regarding  medicines are outlined above.   Tests Ordered: No orders of the defined types were placed in this encounter.   Medication Changes: No orders of the defined types were placed in this encounter.   Disposition:  FU with Richardson Dopp, PA in the office on Friday.   Patient is agreeable to this plan and will call if any problems develop in the interim.   Amie Critchley, NP  08/03/2018 2:18 PM    [facility]

## 2018-08-03 ENCOUNTER — Telehealth (INDEPENDENT_AMBULATORY_CARE_PROVIDER_SITE_OTHER): Payer: Medicare Other | Admitting: Nurse Practitioner

## 2018-08-03 ENCOUNTER — Other Ambulatory Visit: Payer: Self-pay

## 2018-08-03 ENCOUNTER — Encounter: Payer: Self-pay | Admitting: Nurse Practitioner

## 2018-08-03 ENCOUNTER — Telehealth: Payer: Self-pay | Admitting: *Deleted

## 2018-08-03 VITALS — BP 140/56 | Ht 69.0 in

## 2018-08-03 DIAGNOSIS — I25119 Atherosclerotic heart disease of native coronary artery with unspecified angina pectoris: Secondary | ICD-10-CM | POA: Diagnosis not present

## 2018-08-03 DIAGNOSIS — I209 Angina pectoris, unspecified: Secondary | ICD-10-CM

## 2018-08-03 DIAGNOSIS — I4510 Unspecified right bundle-branch block: Secondary | ICD-10-CM

## 2018-08-03 DIAGNOSIS — J439 Emphysema, unspecified: Secondary | ICD-10-CM

## 2018-08-03 DIAGNOSIS — Z720 Tobacco use: Secondary | ICD-10-CM

## 2018-08-03 DIAGNOSIS — I10 Essential (primary) hypertension: Secondary | ICD-10-CM

## 2018-08-03 DIAGNOSIS — Z7189 Other specified counseling: Secondary | ICD-10-CM

## 2018-08-03 DIAGNOSIS — E785 Hyperlipidemia, unspecified: Secondary | ICD-10-CM

## 2018-08-03 MED ORDER — ISOSORBIDE MONONITRATE ER 60 MG PO TB24: 90.0000 mg | ORAL_TABLET | Freq: Every day | ORAL | 3 refills | Status: DC

## 2018-08-03 NOTE — Patient Instructions (Addendum)
After Visit Summary:  We will be checking the following labs today - NONE   Medication Instructions:    Continue with your current medicines. BUT   I am increasing the Imdur to 90 mg - this is a pill and a half every day - this is at your pharmacy.   If you need a refill on your cardiac medications before your next appointment, please call your pharmacy.     Testing/Procedures To Be Arranged:  N/A  Follow-Up:   See Richardson Dopp, PA on Friday at 11:15 AM at the [facility] office.     At [facility], you and your health needs are our priority.  As part of our continuing mission to provide you with exceptional heart care, we have created designated Provider Care Teams.  These Care Teams include your primary Cardiologist (physician) and Advanced Practice Providers (APPs -  Physician Assistants and Nurse Practitioners) who all work together to provide you with the care you need, when you need it.  Special Instructions:  . Stay safe, stay home, wash your hands for at least 20 seconds and wear a mask when out in public.  . It was good to talk with you and your daughter today.    Call the [place] office at [phone] if you have any questions, problems or concerns.

## 2018-08-03 NOTE — Telephone Encounter (Signed)

## 2018-08-06 NOTE — Progress Notes (Signed)
Cardiology Office Note:    Date:  08/07/2018   ID:  [name] [name], DOB Aug 11, 1940, MRN [id number]  PCP:  [name], MD  Cardiologist:  Sinclair Grooms, MD   Electrophysiologist:  None   Referring MD: [name], MD   Chief Complaint  Patient presents with  . Chest Pain    History of Present Illness:    [name] [name] is a 78 y.o. male with   Coronary artery disease w/ chronic angina  Chronic total occlusion of RCA  Previous Tx with Ranolazine  Admx [facility] w/ CP >> neg biomarkers   Hypertension    Hyperlipidemia   Diabetes Mellitus  Chronic Kidney Disease  COPD  OSA  RBBB  Aortic atherosclerosis   Tobacco abuse  PAD   Bilat popliteal aneurysms s/p LE vascular bypass  He was recently seen via Telemedicine by Truitt Merle, NP on 08/03/2018.  He noted worsening chest pain with frequent use of NTG.  His Imdur was adjusted and follow up arranged in the office.    Mr. [name] [name] is here today with his daughter.  Over the past 2 weeks, he has had increasing angina with exertion.  He has assoc R should pain and shortness of breath.  He takes NTG with relief.  He has had to take 1-2 NTG at a time.  He has had a couple episodes of rest pain.  He has not had paroxysmal nocturnal dyspnea, leg swelling or syncope.   He has not had any bleeding issues.   Prior CV studies:   The following studies were reviewed today:  Carotid US 08/27/17 BIlat ICA 1-39  Cardiac Catheterization 02/02/14 LM patent LAD mid mild dz LCx mild dz RCA prox 100 PCI:  Unsuccessful PCI of RCA  Myoview 11/17/13 EF 49, mod inf infarct, no ischemia; Low Risk  Past Medical History:  Diagnosis Date  . Adenomatous polyp 2007  . Arthritis    "my body"  . B12 deficiency   . BPH (benign prostatic hyperplasia)   . Chronic back pain    "all over"  . Chronic low back pain   . Coronary artery disease 2009  . DDD (degenerative disc disease), cervical   . DDD (degenerative disc disease),  lumbosacral   . DDD (degenerative disc disease), thoracic   . Depression   . Diabetes mellitus without complication ([place])    Type II  . Gastroesophageal reflux   . Gynecomastia 06/2009  . Hyperglycemia   . Hyperlipidemia LDL goal < 70   . MI (myocardial infarction) ([place]) 08/1989; 1993  . OSA on CPAP    "have mask; don't wear it" (02/02/2014) 09/25/15- now has a BiPAp- doesn'tt use it  . Peptic ulcer disease 1959  . Right bundle branch block   . Shortness of breath dyspnea   . Tobacco abuse   . Vertebral compression fracture [facility]) 1970   Surgical Hx: The patient  has a past surgical history that includes Total knee arthroplasty (Left, 01/2009); Joint replacement; Cataract extraction w/ intraocular lens  implant, bilateral (Bilateral, ~ 2012); Penile prosthesis implant (1990's); Nasal septum surgery (1977); Cardiac catheterization (6237; 1993; 08/2012; 11/2013; 02/02/2014); Coronary angioplasty (6283'T); Esophagogastroduodenoscopy (egd) with esophageal dilation (11/2007); Bypass graft popliteal to popliteal (Left, 06/2005); Bypass graft popliteal to popliteal (Right, 02/2001); Scrotal surgery (08/1999); Lower extremity angiogram (Left, 05/2005); left heart catheterization with coronary angiogram (N/A, 09/03/2012); left heart catheterization with coronary angiogram (N/A, 12/01/2013); Cardiac catheterization (02/02/2014); Eye surgery (Bilateral); Colonoscopy w/ polypectomy; Esophagogastroduodenoscopy (  egd) with propofol (N/A, 09/28/2015); Savory dilation (N/A, 09/28/2015); Nailbed repair (Right, 09/17/2016); Full thickness skin graft (Right, 09/17/2016); Esophagogastroduodenoscopy (egd) with propofol (N/A, 09/09/2017); and Savory dilation (N/A, 09/09/2017).   Current Medications: Current Meds  Medication Sig  . aspirin EC 81 MG tablet Take 1 tablet (81 mg total) by mouth daily. Resume after 4 days.  Marland Kitchen buPROPion (WELLBUTRIN) 100 MG tablet Take 100 mg by mouth 2 (two) times daily.  . cholecalciferol (VITAMIN D) 1000  units tablet Take 2,000 Units by mouth daily.  . cyanocobalamin (,VITAMIN B-12,) 1000 MCG/ML injection Inject 1,000 mcg into the muscle every 30 (thirty) days. No specific day of the month. Administered by daughter.  . docusate sodium (COLACE) 100 MG capsule Take 100 mg by mouth 2 (two) times daily as needed for mild constipation.  Marland Kitchen HYDROcodone-acetaminophen (NORCO/VICODIN) 5-325 MG tablet Take 1 tablet by mouth every 6 (six) hours as needed for severe pain.  . isosorbide mononitrate (IMDUR) 60 MG 24 hr tablet Take 1.5 tablets (90 mg total) by mouth daily.  . metoprolol tartrate (LOPRESSOR) 50 MG tablet Take 50 mg by mouth 2 (two) times daily.  . Multiple Vitamin (MULTIVITAMIN WITH MINERALS) TABS tablet Take 1 tablet by mouth daily.  . nitroGLYCERIN (NITROSTAT) 0.4 MG SL tablet Place 0.4 mg under the tongue 3 (three) times daily before meals. Patient takes 15 minutes before meals for esophageal spasms and as needed for chest pain  . pantoprazole (PROTONIX) 40 MG tablet Take 40 mg by mouth daily.  . rosuvastatin (CRESTOR) 10 MG tablet Take 10 mg by mouth daily.  . tamsulosin (FLOMAX) 0.4 MG CAPS capsule Take 0.4 mg by mouth at bedtime.      Allergies:   Doxycycline, Lipitor [atorvastatin], Other, Sulfa antibiotics, Sulfacetamide sodium, and Vytorin [ezetimibe-simvastatin]   Social History   Tobacco Use  . Smoking status: Current Some Day Smoker    Packs/day: 0.25    Years: 59.00    Pack years: 14.75    Types: Cigarettes    Last attempt to quit: 08/25/2012    Years since quitting: 5.9  . Smokeless tobacco: Former Systems developer    Types: Chew  . Tobacco comment: Smokes 1- 2 cigs a day since wife died 07-17-2015  Substance Use Topics  . Alcohol use: No    Alcohol/week: 0.0 standard drinks  . Drug use: No     Family Hx: The patient's family history includes Alzheimer's disease (age of onset: 65) in his mother; Diabetes in his daughter; Diabetes (age of onset: 76) in his father; Fibromyalgia (age of  onset: 6) in his sister; Hypertension in his son; Obesity in his daughter; Peripheral vascular disease in his father; Pneumonia in his father.  ROS:   Please see the history of present illness.    ROS All other systems reviewed and are negative.   EKGs/Labs/Other Test Reviewed:    EKG:  EKG is   ordered today.  The ekg ordered today demonstrates sinus brady HR 50, normal axis, RBBB, 1st degree AVB, PR 212, QTc 402, no changes.   Recent Labs: No results found for requested labs within last 8760 hours.   Recent Lipid Panel Lab Results  Component Value Date/Time   CHOL 100 09/04/2012 05:15 AM   TRIG 136 09/04/2012 05:15 AM   HDL 31 (L) 09/04/2012 05:15 AM   CHOLHDL 3.2 09/04/2012 05:15 AM   LDLCALC 42 09/04/2012 05:15 AM    Physical Exam:    VS:  BP 108/72   Pulse Marland Kitchen)  50   Ht 5\' 9"  (1.753 m)   Wt 215 lb 6.4 oz (97.7 kg)   SpO2 95%   BMI 31.81 kg/m     Wt Readings from Last 3 Encounters:  08/07/18 215 lb 6.4 oz (97.7 kg)  04/06/18 216 lb 9.6 oz (98.2 kg)  09/09/17 230 lb (104.3 kg)     Physical Exam  Constitutional: He is oriented to person, place, and time. He appears well-developed and well-nourished. No distress.  HENT:  Head: Normocephalic and atraumatic.  Eyes: No scleral icterus.  Neck: No JVD present. No thyromegaly present.  Cardiovascular: Normal rate and regular rhythm.  No murmur heard. R FA 2+ without bruits  Pulmonary/Chest: Effort normal. He has no rales.  Abdominal: Soft.  Musculoskeletal:        General: No edema.  Lymphadenopathy:    He has no cervical adenopathy.  Neurological: He is alert and oriented to person, place, and time.  Skin: Skin is warm and dry.  Psychiatric: He has a normal mood and affect.    ASSESSMENT & PLAN:    Coronary artery disease involving native coronary artery of native heart with angina pectoris ([place]) - Plan:  He has a chronically occluded RCA which could not be opened up by PCI.  He has chronic angina that was  stable until the last 2 weeks. He is now having CCS class 3 angina.  He takes NTG with relief.  His ECG is not acute and unchanged when compared to prior tracings.  His BP will not allow further titration of his nitrates and his HR is too low to titrate his beta-blocker.  He was previously on Ranolazine.  I have recommended that he undergo Cardiac Catheterization.  I discussed this with Dr. Radford Pax (attending MD) who agreed.  Risks and benefits of cardiac catheterization have been discussed with the patient.  These include bleeding, infection, kidney damage, stroke, heart attack, death.  The patient understands these risks and is willing to proceed.   -Continue ASA, nitrates, beta-blocker, statin  -Start Ranolazine 500 mg twice daily   -BMET, CBC, SARS-CoV-2 test  -Cardiac Catheterization Monday 6/15 with Dr. Claiborne Billings  -Go to ED if rest pain, worse pain or pain that does not resolve with 2 NTG   Aortic atherosclerosis ([place]) - Plan: Continue statin.  Essential hypertension - Plan: The patient's blood pressure is controlled on his current regimen.  Continue current therapy.    Hyperlipidemia with target LDL less than 70 - Plan:  Continue high dose statin  Tobacco abuse - Plan:  He is trying to quit  Pulmonary emphysema, unspecified emphysema type ([place]) - Plan: He is trying to quit smoking.   Dispo:  Return in about 2 weeks (around 08/21/2018) for Post Procedure Follow Up w/ Dr. [name] or Truitt Merle, NP.   Medication Adjustments/Labs and Tests Ordered: Current medicines are reviewed at length with the patient today.  Concerns regarding medicines are outlined above.  Tests Ordered: Orders Placed This Encounter  Procedures  . Basic metabolic panel  . CBC  . EKG 12-Lead   Medication Changes: Meds ordered this encounter  Medications  . ranolazine (RANEXA) 500 MG 12 hr tablet    Sig: Take 1 tablet (500 mg total) by mouth 2 (two) times daily.    Dispense:  180 tablet    Refill:  3     Signed, Richardson Dopp, PA-C  08/07/2018 12:16 PM    [place], [place]  [address] Phone: [phone]; Fax: [phone]

## 2018-08-06 NOTE — H&P (View-Only) (Signed)
Cardiology Office Note:    Date:  08/07/2018   ID:  [name] [name], DOB 01-14-1941, MRN [id number]  PCP:  [name] Manes, MD  Cardiologist:  [name] Grooms, MD   Electrophysiologist:  None   Referring MD: [name] Manes, MD   Chief Complaint  Patient presents with  . Chest Pain    History of Present Illness:    [name] [name] is a 78 y.o. male with   Coronary artery disease w/ chronic angina  Chronic total occlusion of RCA  Previous Tx with Ranolazine  Admx [facility] w/ CP >> neg biomarkers   Hypertension    Hyperlipidemia   Diabetes Mellitus  Chronic Kidney Disease  COPD  OSA  RBBB  Aortic atherosclerosis   Tobacco abuse  PAD   Bilat popliteal aneurysms s/p LE vascular bypass  He was recently seen via Telemedicine by [name] Merle, NP on 08/03/2018.  He noted worsening chest pain with frequent use of NTG.  His Imdur was adjusted and follow up arranged in the office.    [name] [name] is here today with his daughter.  Over the past 2 weeks, he has had increasing angina with exertion.  He has assoc R should pain and shortness of breath.  He takes NTG with relief.  He has had to take 1-2 NTG at a time.  He has had a couple episodes of rest pain.  He has not had paroxysmal nocturnal dyspnea, leg swelling or syncope.   He has not had any bleeding issues.   Prior CV studies:   The following studies were reviewed today:  Carotid US 08/27/17 BIlat ICA 1-39  Cardiac Catheterization 02/02/14 LM patent LAD mid mild dz LCx mild dz RCA prox 100 PCI:  Unsuccessful PCI of RCA  Myoview 11/17/13 EF 49, mod inf infarct, no ischemia; Low Risk  Past Medical History:  Diagnosis Date  . Adenomatous polyp 2007  . Arthritis    "my body"  . B12 deficiency   . BPH (benign prostatic hyperplasia)   . Chronic back pain    "all over"  . Chronic low back pain   . Coronary artery disease 2009  . DDD (degenerative disc disease), cervical   . DDD (degenerative disc disease),  lumbosacral   . DDD (degenerative disc disease), thoracic   . Depression   . Diabetes mellitus without complication ([place])    Type II  . Gastroesophageal reflux   . Gynecomastia 06/2009  . Hyperglycemia   . Hyperlipidemia LDL goal < 70   . MI (myocardial infarction) ([name]) 08/1989; 1993  . OSA on CPAP    "have mask; don't wear it" (02/02/2014) 09/25/15- now has a BiPAp- doesn'tt use it  . Peptic ulcer disease 1959  . Right bundle branch block   . Shortness of breath dyspnea   . Tobacco abuse   . Vertebral compression fracture [name]) 1970   Surgical Hx: The patient  has a past surgical history that includes Total knee arthroplasty (Left, 01/2009); Joint replacement; Cataract extraction w/ intraocular lens  implant, bilateral (Bilateral, ~ 2012); Penile prosthesis implant (1990's); Nasal septum surgery (1977); Cardiac catheterization (5027; 1993; 08/2012; 11/2013; 02/02/2014); Coronary angioplasty (7412'I); Esophagogastroduodenoscopy (egd) with esophageal dilation (11/2007); Bypass graft popliteal to popliteal (Left, 06/2005); Bypass graft popliteal to popliteal (Right, 02/2001); Scrotal surgery (08/1999); Lower extremity angiogram (Left, 05/2005); left heart catheterization with coronary angiogram (N/A, 09/03/2012); left heart catheterization with coronary angiogram (N/A, 12/01/2013); Cardiac catheterization (02/02/2014); Eye surgery (Bilateral); Colonoscopy w/ polypectomy; Esophagogastroduodenoscopy (  egd) with propofol (N/A, 09/28/2015); Savory dilation (N/A, 09/28/2015); Nailbed repair (Right, 09/17/2016); Full thickness skin graft (Right, 09/17/2016); Esophagogastroduodenoscopy (egd) with propofol (N/A, 09/09/2017); and Savory dilation (N/A, 09/09/2017).   Current Medications: Current Meds  Medication Sig  . aspirin EC 81 MG tablet Take 1 tablet (81 mg total) by mouth daily. Resume after 4 days.  Marland Kitchen buPROPion (WELLBUTRIN) 100 MG tablet Take 100 mg by mouth 2 (two) times daily.  . cholecalciferol (VITAMIN D) 1000  units tablet Take 2,000 Units by mouth daily.  . cyanocobalamin (,VITAMIN B-12,) 1000 MCG/ML injection Inject 1,000 mcg into the muscle every 30 (thirty) days. No specific day of the month. Administered by daughter.  . docusate sodium (COLACE) 100 MG capsule Take 100 mg by mouth 2 (two) times daily as needed for mild constipation.  Marland Kitchen HYDROcodone-acetaminophen (NORCO/VICODIN) 5-325 MG tablet Take 1 tablet by mouth every 6 (six) hours as needed for severe pain.  . isosorbide mononitrate (IMDUR) 60 MG 24 hr tablet Take 1.5 tablets (90 mg total) by mouth daily.  . metoprolol tartrate (LOPRESSOR) 50 MG tablet Take 50 mg by mouth 2 (two) times daily.  . Multiple Vitamin (MULTIVITAMIN WITH MINERALS) TABS tablet Take 1 tablet by mouth daily.  . nitroGLYCERIN (NITROSTAT) 0.4 MG SL tablet Place 0.4 mg under the tongue 3 (three) times daily before meals. Patient takes 15 minutes before meals for esophageal spasms and as needed for chest pain  . pantoprazole (PROTONIX) 40 MG tablet Take 40 mg by mouth daily.  . rosuvastatin (CRESTOR) 10 MG tablet Take 10 mg by mouth daily.  . tamsulosin (FLOMAX) 0.4 MG CAPS capsule Take 0.4 mg by mouth at bedtime.      Allergies:   Doxycycline, Lipitor [atorvastatin], Other, Sulfa antibiotics, Sulfacetamide sodium, and Vytorin [ezetimibe-simvastatin]   Social History   Tobacco Use  . Smoking status: Current Some Day Smoker    Packs/day: 0.25    Years: 59.00    Pack years: 14.75    Types: Cigarettes    Last attempt to quit: 08/25/2012    Years since quitting: 5.9  . Smokeless tobacco: Former Systems developer    Types: Chew  . Tobacco comment: Smokes 1- 2 cigs a day since wife died 07/26/15  Substance Use Topics  . Alcohol use: No    Alcohol/week: 0.0 standard drinks  . Drug use: No     Family Hx: The patient's family history includes Alzheimer's disease (age of onset: 14) in his mother; Diabetes in his daughter; Diabetes (age of onset: 6) in his father; Fibromyalgia (age of  onset: 54) in his sister; Hypertension in his son; Obesity in his daughter; Peripheral vascular disease in his father; Pneumonia in his father.  ROS:   Please see the history of present illness.    ROS All other systems reviewed and are negative.   EKGs/Labs/Other Test Reviewed:    EKG:  EKG is   ordered today.  The ekg ordered today demonstrates sinus brady HR 50, normal axis, RBBB, 1st degree AVB, PR 212, QTc 402, no changes.   Recent Labs: No results found for requested labs within last 8760 hours.   Recent Lipid Panel Lab Results  Component Value Date/Time   CHOL 100 09/04/2012 05:15 AM   TRIG 136 09/04/2012 05:15 AM   HDL 31 (L) 09/04/2012 05:15 AM   CHOLHDL 3.2 09/04/2012 05:15 AM   LDLCALC 42 09/04/2012 05:15 AM    Physical Exam:    VS:  BP 108/72   Pulse Marland Kitchen)  50   Ht 5\' 9"  (1.753 m)   Wt 215 lb 6.4 oz (97.7 kg)   SpO2 95%   BMI 31.81 kg/m     Wt Readings from Last 3 Encounters:  08/07/18 215 lb 6.4 oz (97.7 kg)  04/06/18 216 lb 9.6 oz (98.2 kg)  09/09/17 230 lb (104.3 kg)     Physical Exam  Constitutional: He is oriented to person, place, and time. He appears well-developed and well-nourished. No distress.  HENT:  Head: Normocephalic and atraumatic.  Eyes: No scleral icterus.  Neck: No JVD present. No thyromegaly present.  Cardiovascular: Normal rate and regular rhythm.  No murmur heard. R FA 2+ without bruits  Pulmonary/Chest: Effort normal. He has no rales.  Abdominal: Soft.  Musculoskeletal:        General: No edema.  Lymphadenopathy:    He has no cervical adenopathy.  Neurological: He is alert and oriented to person, place, and time.  Skin: Skin is warm and dry.  Psychiatric: He has a normal mood and affect.    ASSESSMENT & PLAN:    Coronary artery disease involving native coronary artery of native heart with angina pectoris ([place]) - Plan:  He has a chronically occluded RCA which could not be opened up by PCI.  He has chronic angina that was  stable until the last 2 weeks. He is now having CCS class 3 angina.  He takes NTG with relief.  His ECG is not acute and unchanged when compared to prior tracings.  His BP will not allow further titration of his nitrates and his HR is too low to titrate his beta-blocker.  He was previously on Ranolazine.  I have recommended that he undergo Cardiac Catheterization.  I discussed this with Dr. Radford Pax (attending MD) who agreed.  Risks and benefits of cardiac catheterization have been discussed with the patient.  These include bleeding, infection, kidney damage, stroke, heart attack, death.  The patient understands these risks and is willing to proceed.   -Continue ASA, nitrates, beta-blocker, statin  -Start Ranolazine 500 mg twice daily   -BMET, CBC, SARS-CoV-2 test  -Cardiac Catheterization Monday 6/15 with Dr. Claiborne Billings  -Go to ED if rest pain, worse pain or pain that does not resolve with 2 NTG   Aortic atherosclerosis ([place]) - Plan: Continue statin.  Essential hypertension - Plan: The patient's blood pressure is controlled on his current regimen.  Continue current therapy.    Hyperlipidemia with target LDL less than 70 - Plan:  Continue high dose statin  Tobacco abuse - Plan:  He is trying to quit  Pulmonary emphysema, unspecified emphysema type ([place]) - Plan: He is trying to quit smoking.   Dispo:  Return in about 2 weeks (around 08/21/2018) for Post Procedure Follow Up w/ Dr. [name] or [name] Merle, NP.   Medication Adjustments/Labs and Tests Ordered: Current medicines are reviewed at length with the patient today.  Concerns regarding medicines are outlined above.  Tests Ordered: Orders Placed This Encounter  Procedures  . Basic metabolic panel  . CBC  . EKG 12-Lead   Medication Changes: Meds ordered this encounter  Medications  . ranolazine (RANEXA) 500 MG 12 hr tablet    Sig: Take 1 tablet (500 mg total) by mouth 2 (two) times daily.    Dispense:  180 tablet    Refill:  3     Signed, Richardson Dopp, PA-C  08/07/2018 12:16 PM    [place], [place]  [address] Phone: [phone]; Fax: [phone]

## 2018-08-07 ENCOUNTER — Other Ambulatory Visit (HOSPITAL_COMMUNITY)
Admission: RE | Admit: 2018-08-07 | Discharge: 2018-08-07 | Disposition: A | Discharge status: Home / Self Care | Payer: Medicare Other | Source: Ambulatory Visit | Attending: Cardiovascular Disease | Admitting: Cardiovascular Disease

## 2018-08-07 ENCOUNTER — Other Ambulatory Visit: Payer: Self-pay

## 2018-08-07 ENCOUNTER — Ambulatory Visit (INDEPENDENT_AMBULATORY_CARE_PROVIDER_SITE_OTHER): Payer: Medicare Other | Admitting: Physician Assistant

## 2018-08-07 ENCOUNTER — Encounter: Payer: Self-pay | Admitting: Physician Assistant

## 2018-08-07 ENCOUNTER — Encounter: Payer: Self-pay | Admitting: *Deleted

## 2018-08-07 VITALS — BP 108/72 | HR 50 | Ht 69.0 in | Wt 215.4 lb

## 2018-08-07 DIAGNOSIS — E785 Hyperlipidemia, unspecified: Secondary | ICD-10-CM

## 2018-08-07 DIAGNOSIS — Z01812 Encounter for preprocedural laboratory examination: Secondary | ICD-10-CM | POA: Diagnosis present

## 2018-08-07 DIAGNOSIS — Z1159 Encounter for screening for other viral diseases: Secondary | ICD-10-CM | POA: Insufficient documentation

## 2018-08-07 DIAGNOSIS — I700 Atherosclerosis of aorta: Secondary | ICD-10-CM | POA: Diagnosis not present

## 2018-08-07 DIAGNOSIS — I25110 Atherosclerotic heart disease of native coronary artery with unstable angina pectoris: Secondary | ICD-10-CM | POA: Diagnosis not present

## 2018-08-07 DIAGNOSIS — J439 Emphysema, unspecified: Secondary | ICD-10-CM

## 2018-08-07 DIAGNOSIS — I10 Essential (primary) hypertension: Secondary | ICD-10-CM | POA: Diagnosis not present

## 2018-08-07 DIAGNOSIS — Z720 Tobacco use: Secondary | ICD-10-CM

## 2018-08-07 MED ORDER — RANOLAZINE ER 500 MG PO TB12: 500.0000 mg | ORAL_TABLET | Freq: Two times a day (BID) | ORAL | 3 refills | Status: DC

## 2018-08-07 NOTE — Progress Notes (Signed)
Thank you Scott.   Cecille Rubin

## 2018-08-07 NOTE — Patient Instructions (Addendum)
   Medication Instructions:   Start taking Renexa 500 mg twice day    If you need a refill on your cardiac medications before your next appointment, please call your pharmacy.   Lab work:  [place]   If you have labs (blood work) drawn today and your tests are completely normal, you will receive your results only by: Marland Kitchen MyChart Message (if you have MyChart) OR . A paper copy in the mail If you have any lab test that is abnormal or we need to change your treatment, we will call you to review the results.  Testing/Procedures:  On 08-10-18 Your physician has requested that you have a cardiac catheterization. Cardiac catheterization is used to diagnose and/or treat various heart conditions. Doctors may recommend this procedure for a number of different reasons. The most common reason is to evaluate chest pain. Chest pain can be a symptom of coronary artery disease (CAD), and cardiac catheterization can show whether plaque is narrowing or blocking your heart's arteries. This procedure is also used to evaluate the valves, as well as measure the blood flow and oxygen levels in different parts of your heart. For further information please visit [email or web address]. Please follow instruction sheet, as given.  Follow-Up: At [facility], you and your health needs are our priority.  As part of our continuing mission to provide you with exceptional heart care, we have created designated Provider Care Teams.  These Care Teams include your primary Cardiologist (physician) and Advanced Practice Providers (APPs -  Physician Assistants and Nurse Practitioners) who all work together to provide you with the care you need, when you need it. You will need a follow up appointment in 2 weeks.  Please call our office 2 months in advance to schedule this appointment.  You may see Sinclair Grooms, MD or one of the following Advanced Practice Providers on your designated Care Team:   Truitt Merle, NP  Any Other  Special Instructions Will Be Listed Below (If Applicable).   Go to ED if more chest pain at rest or chest pain that doesn't go away with two nitroglycerins tablets

## 2018-08-08 LAB — CBC
Hematocrit: 40.8 % (ref 37.5–51.0)
Hemoglobin: 13.6 g/dL (ref 13.0–17.7)
MCH: 30.8 pg (ref 26.6–33.0)
MCHC: 33.3 g/dL (ref 31.5–35.7)
MCV: 92 fL (ref 79–97)
Platelets: 179 10*3/uL (ref 150–450)
RBC: 4.42 x10E6/uL (ref 4.14–5.80)
RDW: 12.9 % (ref 11.6–15.4)
WBC: 7.3 10*3/uL (ref 3.4–10.8)

## 2018-08-08 LAB — BASIC METABOLIC PANEL
BUN/Creatinine Ratio: 17 (ref 10–24)
BUN: 14 mg/dL (ref 8–27)
CO2: 27 mmol/L (ref 20–29)
Calcium: 9.1 mg/dL (ref 8.6–10.2)
Chloride: 102 mmol/L (ref 96–106)
Creatinine, Ser: 0.81 mg/dL (ref 0.76–1.27)
GFR calc Af Amer: 98 mL/min/{1.73_m2} (ref >59)
GFR calc non Af Amer: 85 mL/min/{1.73_m2} (ref >59)
Glucose: 75 mg/dL (ref 65–99)
Potassium: 4.6 mmol/L (ref 3.5–5.2)
Sodium: 141 mmol/L (ref 134–144)

## 2018-08-08 LAB — NOVEL CORONAVIRUS, NAA (HOSP ORDER, SEND-OUT TO REF LAB; TAT 18-24 HRS): SARS-CoV-2, NAA: NOT DETECTED

## 2018-08-10 ENCOUNTER — Ambulatory Visit (HOSPITAL_COMMUNITY)
Admission: RE | Admit: 2018-08-10 | Discharge: 2018-08-10 | Disposition: A | Discharge status: Home / Self Care | Payer: Medicare Other | Attending: Cardiovascular Disease | Admitting: Cardiovascular Disease

## 2018-08-10 ENCOUNTER — Encounter (HOSPITAL_COMMUNITY)
Admission: RE | Disposition: A | Discharge status: Home / Self Care | Payer: Medicare Other | Source: Home / Self Care | Attending: Cardiovascular Disease

## 2018-08-10 ENCOUNTER — Other Ambulatory Visit: Payer: Self-pay

## 2018-08-10 ENCOUNTER — Encounter (HOSPITAL_COMMUNITY): Payer: Self-pay | Admitting: Cardiovascular Disease

## 2018-08-10 DIAGNOSIS — I4510 Unspecified right bundle-branch block: Secondary | ICD-10-CM | POA: Insufficient documentation

## 2018-08-10 DIAGNOSIS — I2582 Chronic total occlusion of coronary artery: Secondary | ICD-10-CM | POA: Insufficient documentation

## 2018-08-10 DIAGNOSIS — I252 Old myocardial infarction: Secondary | ICD-10-CM | POA: Diagnosis not present

## 2018-08-10 DIAGNOSIS — Z882 Allergy status to sulfonamides status: Secondary | ICD-10-CM | POA: Diagnosis not present

## 2018-08-10 DIAGNOSIS — N189 Chronic kidney disease, unspecified: Secondary | ICD-10-CM | POA: Diagnosis not present

## 2018-08-10 DIAGNOSIS — I129 Hypertensive chronic kidney disease with stage 1 through stage 4 chronic kidney disease, or unspecified chronic kidney disease: Secondary | ICD-10-CM | POA: Diagnosis not present

## 2018-08-10 DIAGNOSIS — E1122 Type 2 diabetes mellitus with diabetic chronic kidney disease: Secondary | ICD-10-CM | POA: Insufficient documentation

## 2018-08-10 DIAGNOSIS — K219 Gastro-esophageal reflux disease without esophagitis: Secondary | ICD-10-CM | POA: Insufficient documentation

## 2018-08-10 DIAGNOSIS — F17210 Nicotine dependence, cigarettes, uncomplicated: Secondary | ICD-10-CM | POA: Diagnosis not present

## 2018-08-10 DIAGNOSIS — I25110 Atherosclerotic heart disease of native coronary artery with unstable angina pectoris: Secondary | ICD-10-CM

## 2018-08-10 DIAGNOSIS — E785 Hyperlipidemia, unspecified: Secondary | ICD-10-CM | POA: Insufficient documentation

## 2018-08-10 DIAGNOSIS — G8929 Other chronic pain: Secondary | ICD-10-CM | POA: Diagnosis not present

## 2018-08-10 DIAGNOSIS — N400 Enlarged prostate without lower urinary tract symptoms: Secondary | ICD-10-CM | POA: Insufficient documentation

## 2018-08-10 DIAGNOSIS — Z881 Allergy status to other antibiotic agents status: Secondary | ICD-10-CM | POA: Diagnosis not present

## 2018-08-10 DIAGNOSIS — Z7982 Long term (current) use of aspirin: Secondary | ICD-10-CM | POA: Insufficient documentation

## 2018-08-10 DIAGNOSIS — I25118 Atherosclerotic heart disease of native coronary artery with other forms of angina pectoris: Secondary | ICD-10-CM | POA: Diagnosis not present

## 2018-08-10 DIAGNOSIS — G4733 Obstructive sleep apnea (adult) (pediatric): Secondary | ICD-10-CM | POA: Diagnosis not present

## 2018-08-10 DIAGNOSIS — Z79899 Other long term (current) drug therapy: Secondary | ICD-10-CM | POA: Diagnosis not present

## 2018-08-10 DIAGNOSIS — I700 Atherosclerosis of aorta: Secondary | ICD-10-CM | POA: Diagnosis not present

## 2018-08-10 DIAGNOSIS — M1990 Unspecified osteoarthritis, unspecified site: Secondary | ICD-10-CM | POA: Diagnosis not present

## 2018-08-10 DIAGNOSIS — F329 Major depressive disorder, single episode, unspecified: Secondary | ICD-10-CM | POA: Diagnosis not present

## 2018-08-10 DIAGNOSIS — J449 Chronic obstructive pulmonary disease, unspecified: Secondary | ICD-10-CM | POA: Insufficient documentation

## 2018-08-10 HISTORY — PX: LEFT HEART CATH AND CORONARY ANGIOGRAPHY: CATH118249

## 2018-08-10 SURGERY — LEFT HEART CATH AND CORONARY ANGIOGRAPHY
Anesthesia: LOCAL

## 2018-08-10 MED ORDER — FENTANYL CITRATE (PF) 100 MCG/2ML IJ SOLN
INTRAMUSCULAR | Status: DC | PRN
  Administered 2018-08-10: 25 ug via INTRAVENOUS

## 2018-08-10 MED ORDER — ONDANSETRON HCL 4 MG/2ML IJ SOLN: 4.0000 mg | Freq: Four times a day (QID) | INTRAMUSCULAR | Status: DC | PRN

## 2018-08-10 MED ORDER — LIDOCAINE HCL (PF) 1 % IJ SOLN
INTRAMUSCULAR | Status: AC
  Filled 2018-08-10: qty 30

## 2018-08-10 MED ORDER — HEPARIN (PORCINE) IN NACL 1000-0.9 UT/500ML-% IV SOLN
INTRAVENOUS | Status: AC
  Filled 2018-08-10: qty 1000

## 2018-08-10 MED ORDER — ASPIRIN 81 MG PO CHEW: 81.0000 mg | CHEWABLE_TABLET | Freq: Every day | ORAL | Status: DC

## 2018-08-10 MED ORDER — IOHEXOL 350 MG/ML SOLN
INTRAVENOUS | Status: DC | PRN
  Administered 2018-08-10: 70 mL via INTRAVENOUS

## 2018-08-10 MED ORDER — VERAPAMIL HCL 2.5 MG/ML IV SOLN
INTRAVENOUS | Status: DC | PRN
  Administered 2018-08-10: 10 mL via INTRA_ARTERIAL

## 2018-08-10 MED ORDER — HEPARIN (PORCINE) IN NACL 1000-0.9 UT/500ML-% IV SOLN
INTRAVENOUS | Status: DC | PRN
  Administered 2018-08-10 (×2): 500 mL

## 2018-08-10 MED ORDER — FENTANYL CITRATE (PF) 100 MCG/2ML IJ SOLN
INTRAMUSCULAR | Status: AC
  Filled 2018-08-10: qty 2

## 2018-08-10 MED ORDER — DIAZEPAM 5 MG PO TABS: 5.0000 mg | ORAL_TABLET | Freq: Four times a day (QID) | ORAL | Status: DC | PRN

## 2018-08-10 MED ORDER — HYDRALAZINE HCL 20 MG/ML IJ SOLN: 10.0000 mg | INTRAMUSCULAR | Status: DC | PRN

## 2018-08-10 MED ORDER — HEPARIN SODIUM (PORCINE) 1000 UNIT/ML IJ SOLN
INTRAMUSCULAR | Status: DC | PRN
  Administered 2018-08-10: 5000 [IU] via INTRAVENOUS

## 2018-08-10 MED ORDER — SODIUM CHLORIDE 0.9 % IV SOLN: 250.0000 mL | INTRAVENOUS | Status: DC | PRN

## 2018-08-10 MED ORDER — SODIUM CHLORIDE 0.9% FLUSH: 3.0000 mL | Freq: Two times a day (BID) | INTRAVENOUS | Status: DC

## 2018-08-10 MED ORDER — SODIUM CHLORIDE 0.9% FLUSH: 3.0000 mL | INTRAVENOUS | Status: DC | PRN

## 2018-08-10 MED ORDER — SODIUM CHLORIDE 0.9 % WEIGHT BASED INFUSION: 1.0000 mL/kg/h | INTRAVENOUS | Status: DC

## 2018-08-10 MED ORDER — HEPARIN SODIUM (PORCINE) 1000 UNIT/ML IJ SOLN
INTRAMUSCULAR | Status: AC
  Filled 2018-08-10: qty 1

## 2018-08-10 MED ORDER — MIDAZOLAM HCL 2 MG/2ML IJ SOLN
INTRAMUSCULAR | Status: AC
  Filled 2018-08-10: qty 2

## 2018-08-10 MED ORDER — LIDOCAINE HCL (PF) 1 % IJ SOLN
INTRAMUSCULAR | Status: DC | PRN
  Administered 2018-08-10: 10 mL

## 2018-08-10 MED ORDER — SODIUM CHLORIDE 0.9 % WEIGHT BASED INFUSION
3.0000 mL/kg/h | INTRAVENOUS | Status: AC
  Administered 2018-08-10: 08:00:00 3 mL/kg/h via INTRAVENOUS

## 2018-08-10 MED ORDER — VERAPAMIL HCL 2.5 MG/ML IV SOLN
INTRAVENOUS | Status: AC
  Filled 2018-08-10: qty 2

## 2018-08-10 MED ORDER — SODIUM CHLORIDE 0.9 % IV SOLN: INTRAVENOUS | Status: DC

## 2018-08-10 MED ORDER — MIDAZOLAM HCL 2 MG/2ML IJ SOLN
INTRAMUSCULAR | Status: DC | PRN
  Administered 2018-08-10: 1 mg via INTRAVENOUS

## 2018-08-10 MED ORDER — ASPIRIN 81 MG PO CHEW: 81.0000 mg | CHEWABLE_TABLET | ORAL | Status: DC

## 2018-08-10 MED ORDER — LABETALOL HCL 5 MG/ML IV SOLN: 10.0000 mg | INTRAVENOUS | Status: DC | PRN

## 2018-08-10 MED ORDER — ACETAMINOPHEN 325 MG PO TABS: 650.0000 mg | ORAL_TABLET | ORAL | Status: DC | PRN

## 2018-08-10 SURGICAL SUPPLY — 13 items
BAG SNAP BAND KOVER 36X36 (MISCELLANEOUS) ×1 IMPLANT
CATH INFINITI 5FR ANG PIGTAIL (CATHETERS) ×1 IMPLANT
CATH OPTITORQUE TIG 4.0 5F (CATHETERS) ×1 IMPLANT
CATH OPTITORQUE TIG 4.5 5F (CATHETERS) ×1 IMPLANT
DEVICE RAD COMP TR BAND LRG (VASCULAR PRODUCTS) ×1 IMPLANT
GLIDESHEATH SLEND SS 6F .021 (SHEATH) ×1 IMPLANT
GUIDEWIRE INQWIRE 1.5J.035X260 (WIRE) IMPLANT
INQWIRE 1.5J .035X260CM (WIRE) ×2
KIT HEART LEFT (KITS) ×2 IMPLANT
PACK CARDIAC CATHETERIZATION (CUSTOM PROCEDURE TRAY) ×2 IMPLANT
SYR MEDRAD MARK 7 150ML (SYRINGE) ×2 IMPLANT
TRANSDUCER W/STOPCOCK (MISCELLANEOUS) ×2 IMPLANT
TUBING CIL FLEX 10 FLL-RA (TUBING) ×2 IMPLANT

## 2018-08-10 NOTE — Interval H&P Note (Signed)
Cath Lab Visit (complete for each Cath Lab visit)  Clinical Evaluation Leading to the Procedure:   ACS: No.  Non-ACS:    Anginal Classification: CCS III  Anti-ischemic medical therapy: Maximal Therapy (2 or more classes of medications)  Non-Invasive Test Results: No non-invasive testing performed  Prior CABG: No previous CABG      History and Physical Interval Note:  08/10/2018 9:11 AM  [name] [name]  has presented today for surgery, with the diagnosis of unstable angina.  The various methods of treatment have been discussed with the patient and family. After consideration of risks, benefits and other options for treatment, the patient has consented to  Procedure(s): LEFT HEART CATH AND CORONARY ANGIOGRAPHY (N/A) as a surgical intervention.  The patient's history has been reviewed, patient examined, no change in status, stable for surgery.  I have reviewed the patient's chart and labs.  Questions were answered to the patient's satisfaction.     Shelva Majestic

## 2018-08-10 NOTE — Discharge Instructions (Signed)
Radial Site Care ° °This sheet gives you information about how to care for yourself after your procedure. Your health care provider may also give you more specific instructions. If you have problems or questions, contact your health care provider. °What can I expect after the procedure? °After the procedure, it is common to have: °· Bruising and tenderness at the catheter insertion area. °Follow these instructions at home: °Medicines °· Take over-the-counter and prescription medicines only as told by your health care provider. °Insertion site care °· Follow instructions from your health care provider about how to take care of your insertion site. Make sure you: °? Wash your hands with soap and water before you change your bandage (dressing). If soap and water are not available, use hand sanitizer. °? Change your dressing as told by your health care provider. °? Leave stitches (sutures), skin glue, or adhesive strips in place. These skin closures may need to stay in place for 2 weeks or longer. If adhesive strip edges start to loosen and curl up, you may trim the loose edges. Do not remove adhesive strips completely unless your health care provider tells you to do that. °· Check your insertion site every day for signs of infection. Check for: °? Redness, swelling, or pain. °? Fluid or blood. °? Pus or a bad smell. °? Warmth. °· Do not take baths, swim, or use a hot tub until your health care provider approves. °· You may shower 24-48 hours after the procedure, or as directed by your health care provider. °? Remove the dressing and gently wash the site with plain soap and water. °? Pat the area dry with a clean towel. °? Do not rub the site. That could cause bleeding. °· Do not apply powder or lotion to the site. °Activity ° °· For 24 hours after the procedure, or as directed by your health care provider: °? Do not flex or bend the affected arm. °? Do not push or pull heavy objects with the affected arm. °? Do not  drive yourself home from the hospital or clinic. You may drive 24 hours after the procedure unless your health care provider tells you not to. °? Do not operate machinery or power tools. °· Do not lift anything that is heavier than 10 lb (4.5 kg), or the limit that you are told, until your health care provider says that it is safe. °· Ask your health care provider when it is okay to: °? Return to work or school. °? Resume usual physical activities or sports. °? Resume sexual activity. °General instructions °· If the catheter site starts to bleed, raise your arm and put firm pressure on the site. If the bleeding does not stop, get help right away. This is a medical emergency. °· If you went home on the same day as your procedure, a responsible adult should be with you for the first 24 hours after you arrive home. °· Keep all follow-up visits as told by your health care provider. This is important. °Contact a health care provider if: °· You have a fever. °· You have redness, swelling, or yellow drainage around your insertion site. °Get help right away if: °· You have unusual pain at the radial site. °· The catheter insertion area swells very fast. °· The insertion area is bleeding, and the bleeding does not stop when you hold steady pressure on the area. °· Your arm or hand becomes pale, cool, tingly, or numb. °These symptoms may represent a serious problem   that is an emergency. Do not wait to see if the symptoms will go away. Get medical help right away. Call your local emergency services (911 in the [place] Do not drive yourself to the hospital. °Summary °· After the procedure, it is common to have bruising and tenderness at the site. °· Follow instructions from your health care provider about how to take care of your radial site wound. Check the wound every day for signs of infection. °· Do not lift anything that is heavier than 10 lb (4.5 kg), or the limit that you are told, until your health care provider says  that it is safe. °This information is not intended to replace advice given to you by your health care provider. Make sure you discuss any questions you have with your health care provider. °Document Released: 03/16/2010 Document Revised: 03/19/2017 Document Reviewed: 03/19/2017 °Elsevier Interactive Patient Education © 2019 [employer] ° °

## 2018-08-17 ENCOUNTER — Other Ambulatory Visit: Payer: Self-pay

## 2018-08-17 ENCOUNTER — Emergency Department (HOSPITAL_COMMUNITY)
Admission: EM | Admit: 2018-08-17 | Discharge: 2018-08-17 | Disposition: A | Discharge status: Home / Self Care | Payer: Medicare Other | Attending: Emergency Medicine | Admitting: Emergency Medicine

## 2018-08-17 ENCOUNTER — Emergency Department (HOSPITAL_COMMUNITY): Payer: Medicare Other

## 2018-08-17 ENCOUNTER — Encounter (HOSPITAL_COMMUNITY): Payer: Self-pay | Admitting: Radiology

## 2018-08-17 DIAGNOSIS — Z79899 Other long term (current) drug therapy: Secondary | ICD-10-CM | POA: Diagnosis not present

## 2018-08-17 DIAGNOSIS — I25810 Atherosclerosis of coronary artery bypass graft(s) without angina pectoris: Secondary | ICD-10-CM | POA: Insufficient documentation

## 2018-08-17 DIAGNOSIS — J449 Chronic obstructive pulmonary disease, unspecified: Secondary | ICD-10-CM | POA: Diagnosis not present

## 2018-08-17 DIAGNOSIS — F17210 Nicotine dependence, cigarettes, uncomplicated: Secondary | ICD-10-CM | POA: Insufficient documentation

## 2018-08-17 DIAGNOSIS — I10 Essential (primary) hypertension: Secondary | ICD-10-CM | POA: Diagnosis not present

## 2018-08-17 DIAGNOSIS — I252 Old myocardial infarction: Secondary | ICD-10-CM | POA: Diagnosis not present

## 2018-08-17 DIAGNOSIS — Z7982 Long term (current) use of aspirin: Secondary | ICD-10-CM | POA: Insufficient documentation

## 2018-08-17 DIAGNOSIS — N390 Urinary tract infection, site not specified: Secondary | ICD-10-CM | POA: Insufficient documentation

## 2018-08-17 DIAGNOSIS — E119 Type 2 diabetes mellitus without complications: Secondary | ICD-10-CM | POA: Diagnosis not present

## 2018-08-17 DIAGNOSIS — R1031 Right lower quadrant pain: Secondary | ICD-10-CM | POA: Insufficient documentation

## 2018-08-17 LAB — URINALYSIS, ROUTINE W REFLEX MICROSCOPIC
Bilirubin Urine: NEGATIVE
Glucose, UA: NEGATIVE mg/dL
Hgb urine dipstick: NEGATIVE
Ketones, ur: NEGATIVE mg/dL
Nitrite: NEGATIVE
Protein, ur: NEGATIVE mg/dL
Specific Gravity, Urine: 1.023 (ref 1.005–1.030)
pH: 5 (ref 5.0–8.0)

## 2018-08-17 LAB — COMPREHENSIVE METABOLIC PANEL
ALT: 18 U/L (ref 0–44)
AST: 20 U/L (ref 15–41)
Albumin: 3.4 g/dL — ABNORMAL LOW (ref 3.5–5.0)
Alkaline Phosphatase: 64 U/L (ref 38–126)
Anion gap: 8 (ref 5–15)
BUN: 18 mg/dL (ref 8–23)
CO2: 26 mmol/L (ref 22–32)
Calcium: 9.5 mg/dL (ref 8.9–10.3)
Chloride: 101 mmol/L (ref 98–111)
Creatinine, Ser: 1.07 mg/dL (ref 0.61–1.24)
GFR calc Af Amer: >60 mL/min (ref >60)
GFR calc non Af Amer: >60 mL/min (ref >60)
Glucose, Bld: 126 mg/dL — ABNORMAL HIGH (ref 70–99)
Potassium: 4.2 mmol/L (ref 3.5–5.1)
Sodium: 135 mmol/L (ref 135–145)
Total Bilirubin: 0.6 mg/dL (ref 0.3–1.2)
Total Protein: 6.9 g/dL (ref 6.5–8.1)

## 2018-08-17 LAB — CBC
HCT: 44.2 % (ref 39.0–52.0)
Hemoglobin: 14.4 g/dL (ref 13.0–17.0)
MCH: 31.2 pg (ref 26.0–34.0)
MCHC: 32.6 g/dL (ref 30.0–36.0)
MCV: 95.7 fL (ref 80.0–100.0)
Platelets: 179 K/uL (ref 150–400)
RBC: 4.62 MIL/uL (ref 4.22–5.81)
RDW: 13.6 % (ref 11.5–15.5)
WBC: 7.6 K/uL (ref 4.0–10.5)
nRBC: 0 % (ref 0.0–0.2)

## 2018-08-17 LAB — LIPASE, BLOOD: Lipase: 22 U/L (ref 11–51)

## 2018-08-17 MED ORDER — IOHEXOL 300 MG/ML  SOLN
100.0000 mL | Freq: Once | INTRAMUSCULAR | Status: AC | PRN
  Administered 2018-08-17: 100 mL via INTRAVENOUS

## 2018-08-17 MED ORDER — CEPHALEXIN 500 MG PO CAPS: 500.0000 mg | ORAL_CAPSULE | Freq: Two times a day (BID) | ORAL | 0 refills | Status: DC

## 2018-08-17 MED ORDER — SODIUM CHLORIDE 0.9% FLUSH: 3.0000 mL | Freq: Once | INTRAVENOUS | Status: DC
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
## 2018-08-17 NOTE — Progress Notes (Signed)
Telehealth Visit     Virtual Visit via Video Note   This visit type was conducted due to national recommendations for restrictions regarding the COVID-19 Pandemic (e.g. social distancing) in an effort to limit this patient's exposure and mitigate transmission in our community.  Due to his co-morbid illnesses, this patient is at least at moderate risk for complications without adequate follow up.  This format is felt to be most appropriate for this patient at this time.  All issues noted in this document were discussed and addressed.  A limited physical exam was performed with this format.  Please refer to the patient's chart for his consent to telehealth for [facility]   Evaluation Performed:  Follow-up visit  This visit type was conducted due to national recommendations for restrictions regarding the COVID-19 Pandemic (e.g. social distancing).  This format is felt to be most appropriate for this patient at this time.  All issues noted in this document were discussed and addressed.  No physical exam was performed (except for noted visual exam findings with Video Visits).  Please refer to the patient's chart (MyChart message for video visits and phone note for telephone visits) for the patient's consent to telehealth for [facility].  Date:  08/18/2018   ID:  [name] Abu Sr., DOB 1940-09-22, MRN [id number]  Patient Location:  Home  Provider location:   Home  PCP:  [name], MD  Cardiologist:  Velvet Bathe, MD  Electrophysiologist:  None   Chief Complaint:  Follow up visit.   History of Present Illness:    [name] Biegel. is a 78 y.o. male who presents via audio/video conferencing for a telehealth visit today.  Seen for Dr. [name].  He has a history of COPD, CAD with chronic total occlusion of RCA, essential hypertension, hyperlipidemia, obesity, obstructive sleep apnea, and diabetes mellitus with CKD. He has had issues with his memory.   Last seen in  early February following an ER visit at [facility] for focal sharp left sternal chest discomfort > 10 hrs. Had negative ER evaluation but was recommended to be admitted - he refused - he had brief return of symptoms. He could not remember if this was similar to his prior angina.   At his last visit it was noted that the patient had not been on Ranexa now for quite some time. The exact date was not known. Previously was taking 500 mg twice daily. This was not restarted. He was doing well - medical therapy was continued.   I then saw him for a telehealth visit earlier this month - he was having more angina - confirmed by his daughter in separate call. Ended up having him seen in the office - got referred for cath - see below - to continue with medical management. Ranexa was added back.  He is still smoking.   In the ER yesterday with right abdominal pain - had a CT scan - has kidney stone on the left noted.   The patient does not have symptoms concerning for COVID-19 infection (fever, chills, cough, or new shortness of breath).   Seen today by telephone conversation. He has consented for this visit. This is a post cath visit. He does not have Internet or a smart phone. Does not use MyChart. He feels like he is doing ok. He notes his belly is sore after the events of yesterday. His chest pain has improved - not as severe or as frequent  as what he has had in the past. He uses NTG routinely for his esophagus. Chronic bradycardia noted. He is still smoking - about 3 to 4 cigarettes a day. Overall, he does not have any real concerns today.   Past Medical History:  Diagnosis Date  . Adenomatous polyp 2007  . Arthritis    "my body"  . B12 deficiency   . BPH (benign prostatic hyperplasia)   . Chronic back pain    "all over"  . Chronic low back pain   . Coronary artery disease 2009  . DDD (degenerative disc disease), cervical   . DDD (degenerative disc disease), lumbosacral   .  DDD (degenerative disc disease), thoracic   . Depression   . Diabetes mellitus without complication ([place])    Type II  . Gastroesophageal reflux   . Gynecomastia 06/2009  . Hyperglycemia   . Hyperlipidemia LDL goal < 70   . MI (myocardial infarction) ([place]) 08/1989; 1993  . OSA on CPAP    "have mask; don't wear it" (02/02/2014) 09/25/15- now has a BiPAp- doesn'tt use it  . Peptic ulcer disease 1959  . Right bundle branch block   . Shortness of breath dyspnea   . Tobacco abuse   . Vertebral compression fracture ([place]) 1970   Past Surgical History:  Procedure Laterality Date  . BYPASS GRAFT POPLITEAL TO POPLITEAL Left 06/2005   Dr Donnetta Hutching  . BYPASS GRAFT POPLITEAL TO POPLITEAL Right 02/2001   Dr Deon Pilling  . CARDIAC CATHETERIZATION  1991; 1993; 08/2012; 11/2013; 02/02/2014   I've had 6-7 total  . CARDIAC CATHETERIZATION  02/02/2014   Procedure: LEFT HEART CATH AND CORONARY ANGIOGRAPHY;  Surgeon: Jettie Booze, MD;  mild disease in LAD/CFX, RCA w/ CTO, unsuccessfull PCI both w/ antegrade and retrograde approaches   . CATARACT EXTRACTION W/ INTRAOCULAR LENS  IMPLANT, BILATERAL Bilateral ~ 2012  . COLONOSCOPY W/ POLYPECTOMY    . CORONARY ANGIOPLASTY  1990's  . ESOPHAGOGASTRODUODENOSCOPY (EGD) WITH ESOPHAGEAL DILATION  11/2007  . ESOPHAGOGASTRODUODENOSCOPY (EGD) WITH PROPOFOL N/A 09/28/2015   Procedure: ESOPHAGOGASTRODUODENOSCOPY (EGD) WITH PROPOFOL;  Surgeon: Clarene Essex, MD;  Location: [facility] ENDOSCOPY;  Service: Endoscopy;  Laterality: N/A;  . ESOPHAGOGASTRODUODENOSCOPY (EGD) WITH PROPOFOL N/A 09/09/2017   Procedure: ESOPHAGOGASTRODUODENOSCOPY (EGD) WITH PROPOFOL;  Surgeon: Clarene Essex, MD;  Location: [facility] ENDOSCOPY;  Service: Endoscopy;  Laterality: N/A;  . EYE SURGERY Bilateral   . JOINT REPLACEMENT    . LEFT HEART CATH AND CORONARY ANGIOGRAPHY N/A 08/10/2018   Procedure: LEFT HEART CATH AND CORONARY ANGIOGRAPHY;  Surgeon: Troy Sine, MD;  Location: [place] CV LAB;  Service: Cardiovascular;   Laterality: N/A;  . LEFT HEART CATHETERIZATION WITH CORONARY ANGIOGRAM N/A 09/03/2012   Procedure: LEFT HEART CATHETERIZATION WITH CORONARY ANGIOGRAM;  Surgeon: Sinclair Grooms, MD;  Location: [facility] CATH LAB;  Service: Cardiovascular;  Laterality: N/A;  . LEFT HEART CATHETERIZATION WITH CORONARY ANGIOGRAM N/A 12/01/2013   Procedure: LEFT HEART CATHETERIZATION WITH CORONARY ANGIOGRAM;  Surgeon: Sinclair Grooms, MD;  Location[facility] CATH LAB;  Service: Cardiovascular;  Laterality: N/A;  . LOWER EXTREMITY ANGIOGRAM Left 05/2005   L pop-pop BPG patent  . NAILBED REPAIR Right 09/17/2016   Procedure: ABLATION NAILBED RIGHT INDEX;  Surgeon: Daryll Brod, MD;  Location: [place];  Service: Orthopedics;  Laterality: Right;  . NASAL SEPTUM SURGERY  1977  . PENILE PROSTHESIS IMPLANT  1990's  . SAVORY DILATION N/A 09/28/2015   Procedure: SAVORY DILATION;  Surgeon: Clarene Essex, MD;  Location: [facility] ENDOSCOPY;  Service: Endoscopy;  Laterality: N/A;  have balloons available  . SAVORY DILATION N/A 09/09/2017   Procedure: SAVORY DILATION;  Surgeon: Clarene Essex, MD;  Location: [facility] ENDOSCOPY;  Service: Endoscopy;  Laterality: N/A;  . SCROTAL SURGERY  08/1999   pump revision/notes 07/11/2010  . SKIN FULL THICKNESS GRAFT Right 09/17/2016   Procedure: SKIN GRAFT FROM UPPER ARM;  Surgeon: Daryll Brod, MD;  Location: [place];  Service: Orthopedics;  Laterality: Right;  . TOTAL KNEE ARTHROPLASTY Left 01/2009     Current Meds  Medication Sig  . aspirin EC 81 MG tablet Take 1 tablet (81 mg total) by mouth daily. Resume after 4 days.  Marland Kitchen buPROPion (WELLBUTRIN) 100 MG tablet Take 100 mg by mouth 2 (two) times daily.  . cephALEXin (KEFLEX) 500 MG capsule Take 1 capsule (500 mg total) by mouth 2 (two) times daily. (Patient taking differently: Take 500 mg by mouth 2 (two) times daily. 7 days only)  . cholecalciferol (VITAMIN D) 1000 units tablet Take 2,000 Units by mouth daily.  . cyanocobalamin  (,VITAMIN B-12,) 1000 MCG/ML injection Inject 1,000 mcg into the muscle every 30 (thirty) days. No specific day of the month. Administered by daughter.  . docusate sodium (COLACE) 100 MG capsule Take 100 mg by mouth 2 (two) times daily as needed for mild constipation.  Marland Kitchen HYDROcodone-acetaminophen (NORCO/VICODIN) 5-325 MG tablet Take 1 tablet by mouth every 6 (six) hours as needed for severe pain.  . isosorbide mononitrate (IMDUR) 60 MG 24 hr tablet Take 1.5 tablets (90 mg total) by mouth daily.  . metoprolol tartrate (LOPRESSOR) 50 MG tablet Take 50 mg by mouth 2 (two) times daily.  . Multiple Vitamin (MULTIVITAMIN WITH MINERALS) TABS tablet Take 1 tablet by mouth daily.  . nitroGLYCERIN (NITROSTAT) 0.4 MG SL tablet Place 0.4 mg under the tongue 3 (three) times daily before meals. Patient takes 15 minutes before meals for esophageal spasms and as needed for chest pain  . pantoprazole (PROTONIX) 40 MG tablet Take 40 mg by mouth daily.  . ranolazine (RANEXA) 500 MG 12 hr tablet Take 1 tablet (500 mg total) by mouth 2 (two) times daily.  . rosuvastatin (CRESTOR) 10 MG tablet Take 10 mg by mouth daily.  . tamsulosin (FLOMAX) 0.4 MG CAPS capsule Take 0.4 mg by mouth at bedtime.      Allergies:   Doxycycline, Lipitor [atorvastatin], Other, Sulfa antibiotics, Sulfacetamide sodium, and Vytorin [ezetimibe-simvastatin]   Social History   Tobacco Use  . Smoking status: Current Every Day Smoker    Packs/day: 0.25    Years: 59.00    Pack years: 14.75    Types: Cigarettes  . Smokeless tobacco: Former Systems developer    Types: Chew  . Tobacco comment: Smokes 1- 2 cigs a day since wife died 07-Jul-2015  Substance Use Topics  . Alcohol use: No    Alcohol/week: 0.0 standard drinks  . Drug use: No     Family Hx: The patient's family history includes Alzheimer's disease (age of onset: 19) in his mother; Diabetes in his daughter; Diabetes (age of onset: 55) in his father; Fibromyalgia (age of onset: 54) in his sister;  Hypertension in his son; Obesity in his daughter; Peripheral vascular disease in his father; Pneumonia in his father.  ROS:   Please see the history of present illness.   All other systems reviewed are negative.    Objective:    Vital Signs:  Wt 230 lb (104.3 kg)   BMI  33.97 kg/m    Wt Readings from Last 3 Encounters:  08/18/18 230 lb (104.3 kg)  08/17/18 235 lb (106.6 kg)  08/07/18 215 lb 6.4 oz (97.7 kg)    Alert male in no acute distress. Not short of breath with conversation. BP yesterday was 126/70.    Labs/Other Tests and Data Reviewed:    Lab Results  Component Value Date   WBC 7.6 08/17/2018   HGB 14.4 08/17/2018   HCT 44.2 08/17/2018   PLT 179 08/17/2018   GLUCOSE 126 (H) 08/17/2018   CHOL 100 09/04/2012   TRIG 136 09/04/2012   HDL 31 (L) 09/04/2012   LDLCALC 42 09/04/2012   ALT 18 08/17/2018   AST 20 08/17/2018   NA 135 08/17/2018   K 4.2 08/17/2018   CL 101 08/17/2018   CREATININE 1.07 08/17/2018   BUN 18 08/17/2018   CO2 26 08/17/2018   INR 1.05 02/02/2014        BNP (last 3 results) No results for input(s): BNP in the last 8760 hours.  ProBNP (last 3 results) No results for input(s): PROBNP in the last 8760 hours.    Prior CV studies:    The following studies were reviewed today:  LEFT HEART CATH AND CORONARY ANGIOGRAPHY 07/2018  Conclusion    Prox RCA to Dist RCA lesion is 100% stenosed.  Mid LAD lesion is 30% stenosed.  The left ventricular ejection fraction is 50-55% by visual estimate.  There is mild left ventricular systolic dysfunction.  LV end diastolic pressure is mildly elevated.   Known chronic total occlusion of the proximal RCA with extensive collateralization to a large distal RCA with the left circumflex coronary artery supplying the posterior lateral vessels and LAD/septal perforating arteries supplying the PDA.  There is an 80% stenosis in the continuation branch between the PDA and PLA takeoff.  Mild  nonobstructive disease involving the LAD with 30% stenosis after the second diagonal vessel and normal left circumflex coronary artery.  Low normal LV function with EF estimated approximately 50%.  There is a focal area of inferobasilar akinesis.  The EDP is 18 mm Hg.  RECOMMENDATION: Medical therapy.  The patient has been on nitrates and beta-blocker therapy and was recently started on Ranexa 500 mg twice a day.  If he continues to increase symptoms would further titrate as an outpatient to 1000 mg twice a day.  Continue aggressive lipid-lowering therapy.  The patient is on rosuvastatin.  Target LDL is less than 70.    Carotid Doppler assessment August 27, 2017: Final Interpretation: Right Carotid: Velocities in the right ICA are consistent with a 1-39% stenosis.  Left Carotid: Velocities in the left ICA are consistent with a 1-39% stenosis.  Vertebrals: Bilateral vertebral arteries demonstrate antegrade flow. Subclavians: Normal flow hemodynamics were seen in bilateral subclavianarteries.   CARDIAC CATH IMPRESSIONS 11/2013:1. Chronic total occlusion of the RCA which is well collateralized from the left circumflex and the LAD cell perforators. 2. Widely patent RCA and circumflex coronary arteries 3. Diastolic heart failure with LVEDP 28 mmHg 4. Class III angina pectoris   RECOMMENDATION:Intensify medical therapy. Decrease LVEDP to hopefully improve angina via the [place] of [place]. We'll double beta blocker to metoprolol 50 mg twice a day; change to isosorbide mononitrate 60 mg per day; add low-dose diuretic therapy.  Will refer to the CT oh team, [name].  PCI/CTO IMPRESSIONS 11/2013:  1. Unsuccessful attempt at CTO PCI of the RCA. Both antegrade and retrograde approaches were attempted. 2. LVEDP  12 mmHg.   RECOMMENDATION:Intensify medical therapy as tolerated for angina. If symptoms cannot be controlled, would have to consider using the epicardial  collateral from the circumflex to get into the right. This would be a procedure with significantly more risk of perforation of the collateral. Hopefully, we can try to control his angina with other therapy.     ASSESSMENT & PLAN:    1.  CAD - totally occluded RCA - remote unsuccessful attempt at CTO in 2015 - s/p recent cath for recurrent chest pain - stable findings - he is back on Ranexa - I have left him on his current dose - he sounds like his angina has improved.   2. Abdominal pain - treated for possible kidney stone - on medical therapy at this time. Seems improved. To follow back up with PCP.   3. HLD - on statin - LDL at goal from his labs back in November of 2019.   4. Chronic RBBB  5. HTN - BP yesterday in the ER was ok.   6. Ongoing tobacco abuse - encouraged him to continue to taper down with goal of total abstinence.   7. Carotid disease - last study from 2019 noted.   8. Pulmonary emphysema, unspecified emphysema type ([place]) - continues to smoke  9. COVID-19 Education: The signs and symptoms of COVID-19 were discussed with the patient and how to seek care for testing (follow up with PCP or arrange E-visit).  The importance of social distancing, staying at home, hand hygiene and wearing a mask when out in public were discussed today.  Patient Risk:   After full review of this patient's clinical status, I feel that they are at least moderate risk at this time.  Time:   Today, I have spent 8 minutes with the patient with telehealth technology discussing the above issues.     Medication Adjustments/Labs and Tests Ordered: Current medicines are reviewed at length with the patient today.  Concerns regarding medicines are outlined above.   Daughter [name] was called following this visit - message left - would like to see [name] [name] back in the office in about 2 to 3 months - I have asked her to call to schedule.   Tests Ordered: No orders of the defined types were placed  in this encounter.   Medication Changes: No orders of the defined types were placed in this encounter.   Disposition:  FU with me in about 2 to 3 months.    Patient is agreeable to this plan and will call if any problems develop in the interim.   Amie Critchley, NP  08/18/2018 2:53 PM    [facility]

## 2018-08-17 NOTE — ED Notes (Signed)
RN navigator made contact with patients daughter [name]. Plan to wait for patients UA to come back and likely discharge home. Pts daughter appreciated the update.

## 2018-08-17 NOTE — ED Notes (Signed)
Pt aware that a urine sample is needed; urinal at bedside 

## 2018-08-17 NOTE — Progress Notes (Deleted)
CARDIOLOGY OFFICE NOTE  Date:  08/17/2018    [name] [name] Date of Birth: 1940-04-13 Medical Record #[id number]  PCP:  [name], MD  Cardiologist:  Servando Snare & ***    No chief complaint on file.   History of Present Illness: [name] [name] is a 78 y.o. male who presents today for a ***  The patient {does/does not:200015} have symptoms concerning for COVID-19 infection (fever, chills, cough, or new shortness of breath).   Comes in today. Here with   Past Medical History:  Diagnosis Date  . Adenomatous polyp 2007  . Arthritis    "my body"  . B12 deficiency   . BPH (benign prostatic hyperplasia)   . Chronic back pain    "all over"  . Chronic low back pain   . Coronary artery disease 2009  . DDD (degenerative disc disease), cervical   . DDD (degenerative disc disease), lumbosacral   . DDD (degenerative disc disease), thoracic   . Depression   . Diabetes mellitus without complication ([place])    Type II  . Gastroesophageal reflux   . Gynecomastia 06/2009  . Hyperglycemia   . Hyperlipidemia LDL goal < 70   . MI (myocardial infarction) ([place]) 08/1989; 1993  . OSA on CPAP    "have mask; don't wear it" (02/02/2014) 09/25/15- now has a BiPAp- doesn'tt use it  . Peptic ulcer disease 1959  . Right bundle branch block   . Shortness of breath dyspnea   . Tobacco abuse   . Vertebral compression fracture ([place]) 1970    Past Surgical History:  Procedure Laterality Date  . BYPASS GRAFT POPLITEAL TO POPLITEAL Left 06/2005   Dr Donnetta Hutching  . BYPASS GRAFT POPLITEAL TO POPLITEAL Right 02/2001   Dr Deon Pilling  . CARDIAC CATHETERIZATION  1991; 1993; 08/2012; 11/2013; 02/02/2014   I've had 6-7 total  . CARDIAC CATHETERIZATION  02/02/2014   Procedure: LEFT HEART CATH AND CORONARY ANGIOGRAPHY;  Surgeon: Jettie Booze, MD;  mild disease in LAD/CFX, RCA w/ CTO, unsuccessfull PCI both w/ antegrade and retrograde approaches   . CATARACT EXTRACTION W/ INTRAOCULAR LENS  IMPLANT, BILATERAL  Bilateral ~ 2012  . COLONOSCOPY W/ POLYPECTOMY    . CORONARY ANGIOPLASTY  1990's  . ESOPHAGOGASTRODUODENOSCOPY (EGD) WITH ESOPHAGEAL DILATION  11/2007  . ESOPHAGOGASTRODUODENOSCOPY (EGD) WITH PROPOFOL N/A 09/28/2015   Procedure: ESOPHAGOGASTRODUODENOSCOPY (EGD) WITH PROPOFOL;  Surgeon: Clarene Essex, MD;  Location: [facility] ENDOSCOPY;  Service: Endoscopy;  Laterality: N/A;  . ESOPHAGOGASTRODUODENOSCOPY (EGD) WITH PROPOFOL N/A 09/09/2017   Procedure: ESOPHAGOGASTRODUODENOSCOPY (EGD) WITH PROPOFOL;  Surgeon: Clarene Essex, MD;  Location: [facility] ENDOSCOPY;  Service: Endoscopy;  Laterality: N/A;  . EYE SURGERY Bilateral   . JOINT REPLACEMENT    . LEFT HEART CATH AND CORONARY ANGIOGRAPHY N/A 08/10/2018   Procedure: LEFT HEART CATH AND CORONARY ANGIOGRAPHY;  Surgeon: Troy Sine, MD;  Location: [facility] CV LAB;  Service: Cardiovascular;  Laterality: N/A;  . LEFT HEART CATHETERIZATION WITH CORONARY ANGIOGRAM N/A 09/03/2012   Procedure: LEFT HEART CATHETERIZATION WITH CORONARY ANGIOGRAM;  Surgeon: Sinclair Grooms, MD;  Location: [facility] CATH LAB;  Service: Cardiovascular;  Laterality: N/A;  . LEFT HEART CATHETERIZATION WITH CORONARY ANGIOGRAM N/A 12/01/2013   Procedure: LEFT HEART CATHETERIZATION WITH CORONARY ANGIOGRAM;  Surgeon: Sinclair Grooms, MD;  Location: [facility] CATH LAB;  Service: Cardiovascular;  Laterality: N/A;  . LOWER EXTREMITY ANGIOGRAM Left 05/2005   L pop-pop BPG patent  . NAILBED REPAIR Right 09/17/2016   Procedure: ABLATION  NAILBED RIGHT INDEX;  Surgeon: Daryll Brod, MD;  Location: [facility];  Service: Orthopedics;  Laterality: Right;  . NASAL SEPTUM SURGERY  1977  . PENILE PROSTHESIS IMPLANT  1990's  . SAVORY DILATION N/A 09/28/2015   Procedure: SAVORY DILATION;  Surgeon: Clarene Essex, MD;  Location: [facility] ENDOSCOPY;  Service: Endoscopy;  Laterality: N/A;  have balloons available  . SAVORY DILATION N/A 09/09/2017   Procedure: SAVORY DILATION;  Surgeon: Clarene Essex, MD;  Location: [facility] ENDOSCOPY;   Service: Endoscopy;  Laterality: N/A;  . SCROTAL SURGERY  08/1999   pump revision/notes 07/11/2010  . SKIN FULL THICKNESS GRAFT Right 09/17/2016   Procedure: SKIN GRAFT FROM UPPER ARM;  Surgeon: Daryll Brod, MD;  Location: [facility];  Service: Orthopedics;  Laterality: Right;  . TOTAL KNEE ARTHROPLASTY Left 01/2009     Medications: No outpatient medications have been marked as taking for the 08/18/18 encounter (Appointment) with Burtis Junes, NP.     Allergies: Allergies  Allergen Reactions  . Doxycycline Other (See Comments)    Dizzy   . Lipitor [Atorvastatin] Other (See Comments)    Muscle weakness  . Other Other (See Comments)    Antibiotic (pt unsure of name-will find out for [place]) - severe rash, itching and hives  . Sulfa Antibiotics Hives  . Sulfacetamide Sodium Hives  . Vytorin [Ezetimibe-Simvastatin] Other (See Comments)    Body aches    Social History: The patient  reports that he has been smoking cigarettes. He has a 14.75 pack-year smoking history. He has quit using smokeless tobacco.  His smokeless tobacco use included chew. He reports that he does not drink alcohol or use drugs.   Family History: The patient's ***family history includes Alzheimer's disease (age of onset: 50) in his mother; Diabetes in his daughter; Diabetes (age of onset: 41) in his father; Fibromyalgia (age of onset: 72) in his sister; Hypertension in his son; Obesity in his daughter; Peripheral vascular disease in his father; Pneumonia in his father.   Review of Systems: Please see the history of present illness.   All other systems are reviewed and negative.   Physical Exam: VS:  There were no vitals taken for this visit. Marland Kitchen  BMI There is no height or weight on file to calculate BMI.  Wt Readings from Last 3 Encounters:  08/07/18 215 lb 6.4 oz (97.7 kg)  04/06/18 216 lb 9.6 oz (98.2 kg)  09/09/17 230 lb (104.3 kg)    General: Pleasant. Well developed, well nourished and in  no acute distress.   HEENT: Normal.  Neck: Supple, no JVD, carotid bruits, or masses noted.  Cardiac: ***Regular rate and rhythm. No murmurs, rubs, or gallops. No edema.  Respiratory:  Lungs are clear to auscultation bilaterally with normal work of breathing.  GI: Soft and nontender.  MS: No deformity or atrophy. Gait and ROM intact.  Skin: Warm and dry. Color is normal.  Neuro:  Strength and sensation are intact and no gross focal deficits noted.  Psych: Alert, appropriate and with normal affect.   LABORATORY DATA:  EKG:  EKG {ACTION; IS/IS [id number]} ordered today. This demonstrates ***.  Lab Results  Component Value Date   WBC 7.3 08/07/2018   HGB 13.6 08/07/2018   HCT 40.8 08/07/2018   PLT 179 08/07/2018   GLUCOSE 75 08/07/2018   CHOL 100 09/04/2012   TRIG 136 09/04/2012   HDL 31 (L) 09/04/2012   LDLCALC 42 09/04/2012   ALT 17 09/03/2012  AST 20 09/03/2012   NA 141 08/07/2018   K 4.6 08/07/2018   CL 102 08/07/2018   CREATININE 0.81 08/07/2018   BUN 14 08/07/2018   CO2 27 08/07/2018   INR 1.05 02/02/2014     BNP (last 3 results) No results for input(s): BNP in the last 8760 hours.  ProBNP (last 3 results) No results for input(s): PROBNP in the last 8760 hours.   Other Studies Reviewed Today:   Assessment/Plan:  . COVID-19 Education: The signs and symptoms of COVID-19 were discussed with the patient and how to seek care for testing (follow up with PCP or arrange E-visit).  The importance of social distancing, staying at home, hand hygiene and wearing a mask when out in public were discussed today.  Current medicines are reviewed with the patient today.  The patient does not have concerns regarding medicines other than what has been noted above.  The following changes have been made:  See above.  Labs/ tests ordered today include:   No orders of the defined types were placed in this encounter.    Disposition:   FU with *** in {gen number  [id number]:[id number]} {Days to years:10300}.   Patient is agreeable to this plan and will call if any problems develop in the interim.   SignedTruitt Merle, NP  08/17/2018 7:57 AM  [facility] [address] [place], [place]  [address] Phone: [phone] Fax: [phone]

## 2018-08-17 NOTE — ED Notes (Signed)
Patient transported to CT 

## 2018-08-17 NOTE — ED Notes (Signed)
Patient verbalizes understanding of discharge instructions. Opportunity for questioning and answers were provided. Armband removed by staff, pt discharged from ED.  

## 2018-08-18 ENCOUNTER — Telehealth (INDEPENDENT_AMBULATORY_CARE_PROVIDER_SITE_OTHER): Payer: Medicare Other | Admitting: Nurse Practitioner

## 2018-08-18 ENCOUNTER — Encounter: Payer: Self-pay | Admitting: Nurse Practitioner

## 2018-08-18 VITALS — Wt 230.0 lb

## 2018-08-18 DIAGNOSIS — I25110 Atherosclerotic heart disease of native coronary artery with unstable angina pectoris: Secondary | ICD-10-CM

## 2018-08-18 DIAGNOSIS — I10 Essential (primary) hypertension: Secondary | ICD-10-CM

## 2018-08-18 DIAGNOSIS — Z7189 Other specified counseling: Secondary | ICD-10-CM

## 2018-08-18 DIAGNOSIS — Z9889 Other specified postprocedural states: Secondary | ICD-10-CM

## 2018-08-18 DIAGNOSIS — Z720 Tobacco use: Secondary | ICD-10-CM

## 2018-08-18 LAB — URINE CULTURE: Culture: NO GROWTH

## 2018-08-18 NOTE — Patient Instructions (Signed)
After Visit Summary:  We will be checking the following labs today - NONE   Medication Instructions:    Continue with your current medicines.   We want you to stay on the Ranexa 500 mg twice a day. This helps you have less chest/heart pain.    If you need a refill on your cardiac medications before your next appointment, please call your pharmacy.     Testing/Procedures To Be Arranged:  N/A  Follow-Up:   See me in about 2 to 3 months - ask your daughter to call to arrange     At [facility], you and your health needs are our priority.  As part of our continuing mission to provide you with exceptional heart care, we have created designated Provider Care Teams.  These Care Teams include your primary Cardiologist (physician) and Advanced Practice Providers (APPs -  Physician Assistants and Nurse Practitioners) who all work together to provide you with the care you need, when you need it.  Special Instructions:  . Stay safe, stay home, wash your hands for at least 20 seconds and wear a mask when out in public.  . It was good to talk with you today. . Try to keep working on stopping smoking.     Call the [place] office at [phone] if you have any questions, problems or concerns.

## 2018-10-28 ENCOUNTER — Telehealth: Payer: Self-pay | Admitting: Interventional Cardiology

## 2018-10-28 NOTE — Telephone Encounter (Signed)
Spoke to pt.  He reports recently having issues with dizziness/light-headedness. States "head is swimmy, like something is going dark".  Reports that about 2 weeks ago he had near syncopal episode at supper table but his son kept him from falling.  Another example is that when he washed his hands it "was just like the floor was weaving on me and I had to put my hand against wall to keep myself upright/stready".  States his dtr spoke with PCP and they ordered Meclizine, but he has not started this yet as he will start it tonight.  Advised pt he may want to go ahead and start now,  take his evening dose now to see if improvement is symptoms. BP reported below is elevated.  Reports normal pressure avg 130-140s/50-60s. Denies CP, SOB, weight gain, edema or syncope. Pt aware I would forward this to Dr. Thompson Caul nurse to review and discuss w/ [name].  Aware she will follow up w/ recommendation/advisement. Pt agreeable to plan.

## 2018-10-28 NOTE — Telephone Encounter (Signed)
STAT if patient feels like he/she is going to faint   1) Are you dizzy now? Not now  2) Do you feel faint or have you passed out?  no  3) Do you have any other symptoms? Nausea and overall poor feeling. Pt is treated by Dr. [name] for chronic Chest Pain and Angina, and this has continued   4) Have you checked your HR and BP (record if available)?    Sitting: 160/70  Laying: 160/80  Standing: 170/60 Daughter waited 30 minutes  Standing: 160/80  Sitting: 162/70  Blood Sugar 96, o2 96   No significant diet changes or no noted mediation changes. His PCP gave him an rx for meclizine for dizziness, but the patient has just taken the first dose within the past 30 minutes.  The patient does also do physical therapy. The pt showed the patient some exercises to do for vertigo, but the pt does not believe the patient has vertigo..  Daughter states that the patient's BP is high for the patient, but she knows that is considered normal for other patients. She wants to know if the patient needs to be seen, or what else the daughter needs to do.  The Daughter's husband has an appt with Dr. Irish Lack on Friday at [place]. If Dr. [name] feels that the patient should be seen, she will already be in the office with her husband too, and could just bring the patient for a separate appointment

## 2018-10-28 NOTE — Telephone Encounter (Signed)
Updated contact information. If Dr. [name] needs to speak with the daughter, please page her at work. She works at [employer] at [place], [phone] and ask for [employer]

## 2018-10-29 NOTE — Telephone Encounter (Signed)
New Message    Patient's daughter would like the nurse to call her back about the issue.

## 2018-10-29 NOTE — Telephone Encounter (Signed)
This does not sound cardiac. Could be Vertigo or something neurological. If not SOB or having chest pain, heart probably not involved with BP clearly not low.

## 2018-10-29 NOTE — Telephone Encounter (Signed)
Spoke with daughter and she states pt took Meclizine yesterday and it helped slightly.  She is very concerned about pt's BPs being elevated (160-170/80-90).  Pt's BP normally runs low 100s/60s-70s.  She was hoping to have pt seen tomorrow while here with her husband.  No appts available.  Pt is seeing PCP, Dr. Felipa Eth tomorrow at 1:15P.  Advised I will update message send to Dr. [name].

## 2018-10-29 NOTE — Telephone Encounter (Signed)
Spoke with daughter and went over recommendations.  Daughter verbalized understanding.

## 2018-11-13 NOTE — Progress Notes (Signed)
Cardiology Office Note   Date:  11/17/2018   ID:  [name] HAYMAN Sr., DOB October 23, 1940, MRN [id number]  PCP:  [name], MD  Cardiologist: Dr. [name], MD   Chief Complaint  Patient presents with   Follow-up    History of Present Illness: [name] HIGGS Sr. is a 78 y.o. male with a history of COPD, CAD with chronic total occlusion of RCA, essential hypertension, hyperlipidemia, obesity, obstructive sleep apnea and DM 2 with CKD along with memory issues and ongoing tobacco use.  In early February patient was seen for an office visit following an ER visit at [name] for sharp, left sternal chest discomfort for greater than 10 hours.  Cardiac work-up was negative but was recommended to be admitted however patient refused.  He had a brief return of symptoms although could not compared to prior anginal discomfort. He was previously prescribed Ranexa but had not been taking for quite some time otherwise medical therapy was continued.  He was then seen for a telehealth visit early 07/2018 in which he was having more angina symptoms.  He ended up being seen in-office and was referred for cardiac catheterization performed 08/10/2018 with recommendations for continued medical management.  Ranexa was then added back.  On 08/17/2018 he was seen in the emergency department with right abdominal pain in which a CT scan was obtained that noted a left renal stone.  On 08/18/18 he was seen once again for telehealth for post cath visit in which he reported his chest pain had improved stating that it was not as severe or as frequent as it had been in the past.  He was using NTG routinely for his esophagus.  Overall with no real concern  In early September, the patient seen by PCP for dizziness in which meclizine was prescribed. His daughter reported BPs were more elevated than his baseline. BPs at 160-170/ 70-80s.  Daughter reported BP normally runs low in the 100s/60s-70s. Dr.  [name] made aware in which he did not think this was cardiac in nature as he was not having shortness of breath or chest pain.  Today he presents for follow up.  He was recently seen by his PCP in which his BPs were persistently low therefore his metoprolol was reduced from 50 mg twice daily to 25 mg twice daily.  His BP continues to be low on exam today.  He denies dizziness or syncope.  He has not had any falls.  At his PCPs office they diagnosed him with orthostatic hypotension.  He continues to have anginal symptoms due to chronic CTO.  He is maintained on Imdur and Ranexa as well as sublingual nitroglycerin which he takes for esophagus issues and as needed for acute chest pain.  We discussed reducing his metoprolol even further however he just got a prescription refilled for 50 mg tablets for 90-day supply in which he is currently cutting those tablets in half.  We will have him take 25 mg once daily at night for now.  His daughter is with him today and will monitor his BP closely.  Once he gets closer to running out of the 50 mg tablets, can transition to 25 mg tablets with an ideal dose of 12.5 mg twice daily if BP tolerates.  Also discussed liberalizing salt in his diet as well as compression stockings.  Prescription for medical grade compression stockings given.  I will follow him through telephone visit in 1 month.  Past Medical History:  Diagnosis Date   Adenomatous polyp 2007   Arthritis    "my body"   B12 deficiency    BPH (benign prostatic hyperplasia)    Chronic back pain    "all over"   Chronic low back pain    Coronary artery disease 2009   DDD (degenerative disc disease), cervical    DDD (degenerative disc disease), lumbosacral    DDD (degenerative disc disease), thoracic    Depression    Diabetes mellitus without complication (HCC)    Type II   Gastroesophageal reflux    Gynecomastia 06/2009   Hyperglycemia    Hyperlipidemia LDL goal < 70    MI (myocardial  infarction) ([place]) 08/1989; 1993   OSA on CPAP    "have mask; don't wear it" (02/02/2014) 09/25/15- now has a BiPAp- doesn'tt use it   Peptic ulcer disease 1959   Right bundle branch block    Shortness of breath dyspnea    Tobacco abuse    Vertebral compression fracture ([name]) 1970    Past Surgical History:  Procedure Laterality Date   BYPASS GRAFT POPLITEAL TO POPLITEAL Left 06/2005   Dr Donnetta Hutching   BYPASS GRAFT POPLITEAL TO POPLITEAL Right 02/2001   Dr Deon Pilling   CARDIAC CATHETERIZATION  1991; 1993; 08/2012; 11/2013; 02/02/2014   I've had 6-7 total   CARDIAC CATHETERIZATION  02/02/2014   Procedure: LEFT HEART CATH AND CORONARY ANGIOGRAPHY;  Surgeon: Jettie Booze, MD;  mild disease in LAD/CFX, RCA w/ CTO, unsuccessfull PCI both w/ antegrade and retrograde approaches    CATARACT EXTRACTION W/ INTRAOCULAR LENS  IMPLANT, BILATERAL Bilateral ~ 2012   COLONOSCOPY W/ POLYPECTOMY     CORONARY ANGIOPLASTY  1990's   ESOPHAGOGASTRODUODENOSCOPY (EGD) WITH ESOPHAGEAL DILATION  11/2007   ESOPHAGOGASTRODUODENOSCOPY (EGD) WITH PROPOFOL N/A 09/28/2015   Procedure: ESOPHAGOGASTRODUODENOSCOPY (EGD) WITH PROPOFOL;  Surgeon: Clarene Essex, MD;  [facility];  Service: Endoscopy;  Laterality: N/A;   ESOPHAGOGASTRODUODENOSCOPY (EGD) WITH PROPOFOL N/A 09/09/2017   Procedure: ESOPHAGOGASTRODUODENOSCOPY (EGD) WITH PROPOFOL;  Surgeon: Clarene Essex, MD;  Location: [facility] ENDOSCOPY;  Service: Endoscopy;  Laterality: N/A;   EYE SURGERY Bilateral    JOINT REPLACEMENT     LEFT HEART CATH AND CORONARY ANGIOGRAPHY N/A 08/10/2018   Procedure: LEFT HEART CATH AND CORONARY ANGIOGRAPHY;  Surgeon: Troy Sine, MD;  Location: [place] CV LAB;  Service: Cardiovascular;  Laterality: N/A;   LEFT HEART CATHETERIZATION WITH CORONARY ANGIOGRAM N/A 09/03/2012   Procedure: LEFT HEART CATHETERIZATION WITH CORONARY ANGIOGRAM;  Surgeon: Sinclair Grooms, MD;  Location: [facility] CATH LAB;  Service: Cardiovascular;   Laterality: N/A;   LEFT HEART CATHETERIZATION WITH CORONARY ANGIOGRAM N/A 12/01/2013   Procedure: LEFT HEART CATHETERIZATION WITH CORONARY ANGIOGRAM;  Surgeon: Sinclair Grooms, MD;  Location[name] CATH LAB;  Service: Cardiovascular;  Laterality: N/A;   LOWER EXTREMITY ANGIOGRAM Left 05/2005   L pop-pop BPG patent   NAILBED REPAIR Right 09/17/2016   Procedure: ABLATION NAILBED RIGHT INDEX;  Surgeon: Daryll Brod, MD;  Location: [place];  Service: Orthopedics;  Laterality: Right;   NASAL SEPTUM SURGERY  1977   PENILE PROSTHESIS IMPLANT  1990's   SAVORY DILATION N/A 09/28/2015   Procedure: SAVORY DILATION;  Surgeon: Clarene Essex, MD;  Location: [name];  Service: Endoscopy;  Laterality: N/A;  have balloons available   SAVORY DILATION N/A 09/09/2017   Procedure: SAVORY DILATION;  Surgeon: Clarene Essex, MD;  Location: [facility] ENDOSCOPY;  Service: Endoscopy;  Laterality: N/A;  SCROTAL SURGERY  08/1999   pump revision/notes 07/11/2010   SKIN FULL THICKNESS GRAFT Right 09/17/2016   Procedure: SKIN GRAFT FROM UPPER ARM;  Surgeon: Daryll Brod, MD;  Location: [place];  Service: Orthopedics;  Laterality: Right;   TOTAL KNEE ARTHROPLASTY Left 01/2009     Current Outpatient Medications  Medication Sig Dispense Refill   aspirin EC 81 MG tablet Take 1 tablet (81 mg total) by mouth daily. Resume after 4 days.     buPROPion (WELLBUTRIN) 100 MG tablet Take 100 mg by mouth 2 (two) times daily.     cholecalciferol (VITAMIN D) 1000 units tablet Take 2,000 Units by mouth daily.     cyanocobalamin (,VITAMIN B-12,) 1000 MCG/ML injection Inject 1,000 mcg into the muscle every 30 (thirty) days. No specific day of the month. Administered by daughter.     docusate sodium (COLACE) 100 MG capsule Take 100 mg by mouth 2 (two) times daily as needed for mild constipation.     HYDROcodone-acetaminophen (NORCO/VICODIN) 5-325 MG tablet Take 1 tablet by mouth every 6 (six) hours as  needed for severe pain. 30 tablet 0   isosorbide mononitrate (IMDUR) 60 MG 24 hr tablet Take 1.5 tablets (90 mg total) by mouth daily. 135 tablet 3   meclizine (ANTIVERT) 12.5 MG tablet TK 1 T PO BID PRN     Multiple Vitamin (MULTIVITAMIN WITH MINERALS) TABS tablet Take 1 tablet by mouth daily.     nitroGLYCERIN (NITROSTAT) 0.4 MG SL tablet Place 0.4 mg under the tongue 3 (three) times daily before meals. Patient takes 15 minutes before meals for esophageal spasms and as needed for chest pain     pantoprazole (PROTONIX) 40 MG tablet Take 40 mg by mouth daily.     ranolazine (RANEXA) 500 MG 12 hr tablet Take 1 tablet (500 mg total) by mouth 2 (two) times daily. 180 tablet 3   rosuvastatin (CRESTOR) 10 MG tablet Take 10 mg by mouth daily.     tamsulosin (FLOMAX) 0.4 MG CAPS capsule Take 0.4 mg by mouth at bedtime.      metoprolol tartrate (LOPRESSOR) 25 MG tablet Take 1 tablet (25 mg total) by mouth daily. 90 tablet 3   No current facility-administered medications for this visit.     Allergies:   Doxycycline, Lipitor [atorvastatin], Other, Sulfa antibiotics, Sulfacetamide sodium, and Vytorin [ezetimibe-simvastatin]    Social History:  The patient  reports that he has been smoking cigarettes. He has a 14.75 pack-year smoking history. He has quit using smokeless tobacco.  His smokeless tobacco use included chew. He reports that he does not drink alcohol or use drugs.   Family History:  The patient's family history includes Alzheimer's disease (age of onset: 43) in his mother; Diabetes in his daughter; Diabetes (age of onset: 18) in his father; Fibromyalgia (age of onset: 23) in his sister; Hypertension in his son; Obesity in his daughter; Peripheral vascular disease in his father; Pneumonia in his father.    ROS:  Please see the history of present illness. Otherwise, review of systems are positive for none.   All other systems are reviewed and negative.    PHYSICAL EXAM: VS:  BP (!)  86/50    Pulse 65    Ht 5\' 9"  (1.753 m)    Wt 212 lb 12.8 oz (96.5 kg)    SpO2 94%    BMI 31.43 kg/m  , BMI Body mass index is 31.43 kg/m.   General: Well developed, well nourished, NAD  Neck: Negative for carotid bruits. No JVD Lungs:Clear to ausculation bilaterally. No wheezes, rales, or rhonchi. Breathing is unlabored. Cardiovascular: RRR with S1 S2. No murmurs, rubs, gallops, or LV heave appreciated. Extremities: No edema. No clubbing or cyanosis. DP/PT pulses 2+ bilaterally Neuro: Alert and oriented. No focal deficits. No facial asymmetry. MAE spontaneously. Psych: Responds to questions appropriately with normal affect.    EKG:  EKG is not ordered today.   Recent Labs: 08/17/2018: ALT 18; BUN 18; Creatinine, Ser 1.07; Hemoglobin 14.4; Platelets 179; Potassium 4.2; Sodium 135    Lipid Panel    Component Value Date/Time   CHOL 100 09/04/2012 0515   TRIG 136 09/04/2012 0515   HDL 31 (L) 09/04/2012 0515   CHOLHDL 3.2 09/04/2012 0515   VLDL 27 09/04/2012 0515   LDLCALC 42 09/04/2012 0515     Wt Readings from Last 3 Encounters:  11/17/18 212 lb 12.8 oz (96.5 kg)  08/18/18 230 lb (104.3 kg)  08/17/18 235 lb (106.6 kg)     Other studies Reviewed: Additional studies/ records that were reviewed today include:   LEFT HEART CATH AND CORONARY ANGIOGRAPHY 07/2018  Conclusion    Prox RCA to Dist RCA lesion is 100% stenosed.  Mid LAD lesion is 30% stenosed.  The left ventricular ejection fraction is 50-55% by visual estimate.  There is mild left ventricular systolic dysfunction.  LV end diastolic pressure is mildly elevated.  Known chronic total occlusion of the proximal RCA with extensive collateralization to a large distal RCA with the left circumflex coronary artery supplying the posterior lateral vessels and LAD/septal perforating arteries supplying the PDA. There is an 80% stenosis in the continuation branch between the PDA and PLA takeoff.  Mild nonobstructive  disease involving the LAD with 30% stenosis after the second diagonal vessel and normal left circumflex coronary artery.  Low normal LV function with EF estimated approximately 50%. There is a focal area of inferobasilar akinesis. The EDP is 18 mm Hg.  RECOMMENDATION: Medical therapy. The patient has been on nitrates and beta-blocker therapy and was recently started on Ranexa 500 mg twice a day. If he continues to increase symptoms would further titrate as an outpatient to 1000 mg twice a day. Continue aggressive lipid-lowering therapy. The patient is on rosuvastatin. Target LDL is less than 70.     Carotid Doppler assessment August 27, 2017: Final Interpretation: Right Carotid: Velocities in the right ICA are consistent with a 1-39% stenosis.  Left Carotid: Velocities in the left ICA are consistent with a 1-39% stenosis.  Vertebrals: Bilateral vertebral arteries demonstrate antegrade flow. Subclavians: Normal flow hemodynamics were seen in bilateral subclavianarteries.   CARDIAC CATHIMPRESSIONS10/2015:1. Chronic total occlusion of the RCA which is well collateralized from the left circumflex and the LAD cell perforators. 2. Widely patent RCA and circumflex coronary arteries 3. Diastolic heart failure with LVEDP 28 mmHg 4. Class III angina pectoris   RECOMMENDATION:Intensify medical therapy. Decrease LVEDP to hopefully improve angina via the Law of [place]. We'll double beta blocker to metoprolol 50 mg twice a day; change to isosorbide mononitrate 60 mg per day; add low-dose diuretic therapy.  Will refer to the CT oh team, [name].  ASSESSMENT AND PLAN:  1.  CAD: -Total occlusion of RCA with remote unsuccessful attempt at CTO in 2015, s/p recent cath for recurrent chest pain with stable findings -Placed back on Ranexa 500 mg -Continues to have anginal symptoms in which he uses SL NTG at least every day, also uses SL NTG for  esophagus issues    -Continue ASA, Crestor 10 -Decrease metoprolol to 25mg  once daily>>see HPI -Antianginals include Imdur 90 mg daily, Ranexa 500 twice daily  2. Hypotension: -BP today 86/50 with recheck of 90/63.   -Denies dizziness, syncope or falls -Reduce metoprolol to 25 mg once daily at bedtime -Discussed liberalizing salt in his diet -Prescription given for medical grade compression stockings.  -Discussed slow transitions from sitting and lying to standing positions -Patient to keep BP log and will have telephone follow-up in 1 month with patient and daughter  3.  HLD: -Last LDL -On statin therapy -Followed by PCP  4. Tobacco use: -Continues to smoke, cessation strongly encouraged -Not currently followed by pulmonology -Managed by PCP>> needs yearly chest CT  5.  Carotid artery disease: -Last study from 2019 with right and left ICA consistent with 1 to 39% stenosis and bilateral vertebral artery antegrade flow normal subclavian flow -Continue ASA, statin   Current medicines are reviewed at length with the patient today.  The patient does not have concerns regarding medicines.  The following changes have been made: Decrease metoprolol to 25 mg once daily at bedtime  Labs/ tests ordered today include: None  No orders of the defined types were placed in this encounter.    Disposition:   FU with me in 1 month  Signed, Kathyrn Drown, NP  11/17/2018 4:03 PM    [facility] [address], [place] [place], [place]  [id number] Phone: [phone]; Fax: [phone]

## 2018-11-17 ENCOUNTER — Other Ambulatory Visit: Payer: Self-pay

## 2018-11-17 ENCOUNTER — Encounter: Payer: Self-pay | Admitting: Cardiology

## 2018-11-17 ENCOUNTER — Ambulatory Visit (INDEPENDENT_AMBULATORY_CARE_PROVIDER_SITE_OTHER): Payer: Medicare Other | Admitting: Cardiology

## 2018-11-17 VITALS — BP 86/50 | HR 65 | Ht 69.0 in | Wt 212.8 lb

## 2018-11-17 DIAGNOSIS — I739 Peripheral vascular disease, unspecified: Secondary | ICD-10-CM | POA: Diagnosis not present

## 2018-11-17 DIAGNOSIS — I10 Essential (primary) hypertension: Secondary | ICD-10-CM

## 2018-11-17 DIAGNOSIS — I25110 Atherosclerotic heart disease of native coronary artery with unstable angina pectoris: Secondary | ICD-10-CM | POA: Diagnosis not present

## 2018-11-17 DIAGNOSIS — I779 Disorder of arteries and arterioles, unspecified: Secondary | ICD-10-CM

## 2018-11-17 DIAGNOSIS — J439 Emphysema, unspecified: Secondary | ICD-10-CM | POA: Diagnosis not present

## 2018-11-17 MED ORDER — METOPROLOL TARTRATE 25 MG PO TABS: 25.0000 mg | ORAL_TABLET | Freq: Every day | ORAL | 3 refills | Status: DC

## 2018-11-17 NOTE — Patient Instructions (Addendum)
Medication Instructions:  Your physician has recommended you make the following change in your medication:  1. DECREASE METOPROLOL TO 25 MG ONCE A DAY.   If you need a refill on your cardiac medications before your next appointment, please call your pharmacy.   Lab work: NONE If you have labs (blood work) drawn today and your tests are completely normal, you will receive your results only by: Marland Kitchen MyChart Message (if you have MyChart) OR . A paper copy in the mail If you have any lab test that is abnormal or we need to change your treatment, we will call you to review the results.  Testing/Procedures: NONE  Follow-Up: At [facility], you and your health needs are our priority.  As part of our continuing mission to provide you with exceptional heart care, we have created designated Provider Care Teams.  These Care Teams include your primary Cardiologist (physician) and Advanced Practice Providers (APPs -  Physician Assistants and Nurse Practitioners) who all work together to provide you with the care you need, when you need it. . Your physician recommends that you schedule a follow-up VIRTUAL appointment in: [place], [name]   Any Other Special Instructions Will Be Listed Below (If Applicable).    Hypotension As your heart beats, it forces blood through your body. This force is called blood pressure. If you have hypotension, you have low blood pressure. When your blood pressure is too low, you may not get enough blood to your brain or other parts of your body. This may cause you to feel weak, light-headed, have a fast heartbeat, or even pass out (faint). Low blood pressure may be harmless, or it may cause serious problems. What are the causes?  Blood loss.  Not enough water in the body (dehydration).  Heart problems.  Hormone problems.  Pregnancy.  A very bad infection.  Not having enough of certain nutrients.  Very bad allergic reactions.  Certain medicines.  What increases the risk?  Age. The risk increases as you get older.  Conditions that affect the heart or the brain and spinal cord (central nervous system).  Taking certain medicines.  Being pregnant. What are the signs or symptoms?  Feeling: ? Weak. ? Light-headed. ? Dizzy. ? Tired (fatigued).  Blurred vision.  Fast heartbeat.  Passing out, in very bad cases. How is this treated?  Changing your diet. This may involve eating more salt (sodium) or drinking more water.  Taking medicines to raise your blood pressure.  Changing how much you take (the dosage) of some of your medicines.  Wearing compression stockings. These stockings help to prevent blood clots and reduce swelling in your legs. In some cases, you may need to go to the hospital for:  Fluid replacement. This means you will receive fluids through an IV tube.  Blood replacement. This means you will receive donated blood through an IV tube (transfusion).  Treating an infection or heart problems, if this applies.  Monitoring. You may need to be monitored while medicines that you are taking wear off. Follow these instructions at home: Eating and drinking   Drink enough fluids to keep your pee (urine) pale yellow.  Eat a healthy diet. Follow instructions from your doctor about what you can eat or drink. A healthy diet includes: ? Fresh fruits and vegetables. ? Whole grains. ? Low-fat (lean) meats. ? Low-fat dairy products.  Eat extra salt only as told. Do not add extra salt to your diet unless your doctor  tells you to.  Eat small meals often.  Avoid standing up quickly after you eat. Medicines  Take over-the-counter and prescription medicines only as told by your doctor. ? Follow instructions from your doctor about changing how much you take of your medicines, if this applies. ? Do not stop or change any of your medicines on your own. General instructions   Wear compression stockings as told by  your doctor.  Get up slowly from lying down or sitting.  Avoid hot showers and a lot of heat as told by your doctor.  Return to your normal activities as told by your doctor. Ask what activities are safe for you.  Do not use any products that contain nicotine or tobacco, such as cigarettes, e-cigarettes, and chewing tobacco. If you need help quitting, ask your doctor.  Keep all follow-up visits as told by your doctor. This is important. Contact a doctor if:  You throw up (vomit).  You have watery poop (diarrhea).  You have a fever for more than 2-3 days.  You feel more thirsty than normal.  You feel weak and tired. Get help right away if:  You have chest pain.  You have a fast or uneven heartbeat.  You lose feeling (have numbness) in any part of your body.  You cannot move your arms or your legs.  You have trouble talking.  You get sweaty or feel light-headed.  You pass out.  You have trouble breathing.  You have trouble staying awake.  You feel mixed up (confused). Summary  Hypotension is also called low blood pressure. It is when the force of blood pumping through your arteries is too weak.  Hypotension may be harmless, or it may cause serious problems.  Treatment may include changing your diet and medicines, and wearing compression stockings.  In very bad cases, you may need to go to the hospital. This information is not intended to replace advice given to you by your health care provider. Make sure you discuss any questions you have with your health care provider. Document Released: 05/08/2009 Document Revised: 08/07/2017 Document Reviewed: 08/07/2017 Elsevier Patient Education  2020 [employer].

## 2018-12-15 NOTE — Progress Notes (Signed)
Virtual Visit via Telephone Note   This visit type was conducted due to national recommendations for restrictions regarding the COVID-19 Pandemic (e.g. social distancing) in an effort to limit this patient's exposure and mitigate transmission in our community.  Due to his co-morbid illnesses, this patient is at least at moderate risk for complications without adequate follow up.  This format is felt to be most appropriate for this patient at this time.  The patient did not have access to video technology/had technical difficulties with video requiring transitioning to audio format only (telephone).  All issues noted in this document were discussed and addressed.  No physical exam could be performed with this format.  Please refer to the patient's chart for his  consent to telehealth for [facility]   Date:  12/21/2018   ID:  [name] Abu Sr., DOB Apr 08, 1940, MRN [id number]  Patient Location: Home Provider Location: Home  PCP:  [name], MD  Cardiologist:  Sinclair Grooms, MD  Electrophysiologist:  None   Evaluation Performed:  Follow-Up Visit  Chief Complaint:  Follow up, seen for Dr. [name]   History of Present Illness:    [name] STJEAN Sr. is a 78 y.o. male with history of COPD, CAD with chronic total occlusion of RCA, essential hypertension, hyperlipidemia, obesity, obstructive sleep apnea and DM 2 with CKD along with memory issues and ongoing tobacco use.  In early February patient was seen for an office visit following an ER visit at [facility] for sharp, left sternal chest discomfort for greater than 10 hours. Cardiac work-up was negative but was recommended to be admitted however patient refused.  He had a brief return of symptoms although could not compared to prior anginal discomfort. He was previously prescribed Ranexa but had not been taking for quite some time otherwise medical therapy was continued.  He was then seen for a telehealth visit  early 07/2018 in which he was having more angina symptoms. He ended up being seen in-office and was referred for cardiac catheterization performed 08/10/2018 with recommendations for continued medical management.  Ranexa was then added back.  On 08/17/2018 he was seen in the emergency department with right abdominal pain in which a CT scan was obtained that noted a left renal stone.  On 08/18/18 he was seen once again for telehealth for post cath visit in which he reported his chest pain had improved stating that it was not as severe or as frequent as it had been in the past.  He was using NTG routinely for his esophagus.  Overall with no real concern  In early September, the patient seen by PCP for dizziness in which meclizine was prescribed. His daughter reported BPs were more elevated than his baseline. BPs at 160-170/ 70-80s.  Daughter reported BP normally runs low in the 100s/60s-70s. Dr. [name] made aware in which he did not think this was cardiac in nature as he was not having shortness of breath or chest pain.  He was then seen by myself on 11/17/2018 for follow up. Had recently seen his PCP in which his BP's were noted to be persistently low, therefore his metoprolol was reduced from 50 mg twice daily to 25 mg twice daily. At follow up, his BP continued to be low. At his PCPs office they diagnosed him with orthostatic hypotension.  He continued to have anginal symptoms due to chronic CTO.  He was maintained on Imdur and Ranexa as well as sublingual nitroglycerin which he  was taking for esophagus issues and PRN for acute chest pain. We discussed reducing his metoprolol even further however he just got a prescription refilled for 50 mg tablets for 90-day supply in which he was cutting those tablets in half.  Plan was to reduce down to 25mg  QD and monitor his BP closely.    Today, seen via telephone visit.  He reports that his dizziness is improved.  BP today is more stable at 127/60.  States his  daughter takes his BP at his home every morning.  We will still plan to reduce his metoprolol to 12.5 mg twice daily then follow-up closely in 1 month.  Continues to take nitroglycerin for anginal symptoms however this is unchanged from prior visits.  Readdressed liberalizing salt in his diet as well as compression stockings.  Denies shortness of breath, PND, orthopnea, palpitations, syncope.  The patient does not have symptoms concerning for COVID-19 infection (fever, chills, cough, or new shortness of breath).    Past Medical History:  Diagnosis Date  . Adenomatous polyp 2007  . Arthritis    "my body"  . B12 deficiency   . BPH (benign prostatic hyperplasia)   . Chronic back pain    "all over"  . Chronic low back pain   . Coronary artery disease 2009  . DDD (degenerative disc disease), cervical   . DDD (degenerative disc disease), lumbosacral   . DDD (degenerative disc disease), thoracic   . Depression   . Diabetes mellitus without complication ([place])    Type II  . Gastroesophageal reflux   . Gynecomastia 06/2009  . Hyperglycemia   . Hyperlipidemia LDL goal < 70   . MI (myocardial infarction) ([place]) 08/1989; 1993  . OSA on CPAP    "have mask; don't wear it" (02/02/2014) 09/25/15- now has a BiPAp- doesn'tt use it  . Peptic ulcer disease 1959  . Right bundle branch block   . Shortness of breath dyspnea   . Tobacco abuse   . Vertebral compression fracture ([place]) 1970   Past Surgical History:  Procedure Laterality Date  . BYPASS GRAFT POPLITEAL TO POPLITEAL Left 06/2005   Dr Donnetta Hutching  . BYPASS GRAFT POPLITEAL TO POPLITEAL Right 02/2001   Dr Deon Pilling  . CARDIAC CATHETERIZATION  1991; 1993; 08/2012; 11/2013; 02/02/2014   I've had 6-7 total  . CARDIAC CATHETERIZATION  02/02/2014   Procedure: LEFT HEART CATH AND CORONARY ANGIOGRAPHY;  Surgeon: Jettie Booze, MD;  mild disease in LAD/CFX, RCA w/ CTO, unsuccessfull PCI both w/ antegrade and retrograde approaches   . CATARACT EXTRACTION W/  INTRAOCULAR LENS  IMPLANT, BILATERAL Bilateral ~ 2012  . COLONOSCOPY W/ POLYPECTOMY    . CORONARY ANGIOPLASTY  1990's  . ESOPHAGOGASTRODUODENOSCOPY (EGD) WITH ESOPHAGEAL DILATION  11/2007  . ESOPHAGOGASTRODUODENOSCOPY (EGD) WITH PROPOFOL N/A 09/28/2015   Procedure: ESOPHAGOGASTRODUODENOSCOPY (EGD) WITH PROPOFOL;  Surgeon: Clarene Essex, MD;  Location[facility] ENDOSCOPY;  Service: Endoscopy;  Laterality: N/A;  . ESOPHAGOGASTRODUODENOSCOPY (EGD) WITH PROPOFOL N/A 09/09/2017   Procedure: ESOPHAGOGASTRODUODENOSCOPY (EGD) WITH PROPOFOL;  Surgeon: Clarene Essex, MD;  Location: [facility] ENDOSCOPY;  Service: Endoscopy;  Laterality: N/A;  . EYE SURGERY Bilateral   . JOINT REPLACEMENT    . LEFT HEART CATH AND CORONARY ANGIOGRAPHY N/A 08/10/2018   Procedure: LEFT HEART CATH AND CORONARY ANGIOGRAPHY;  Surgeon: Troy Sine, MD;  Location: [place] CV LAB;  Service: Cardiovascular;  Laterality: N/A;  . LEFT HEART CATHETERIZATION WITH CORONARY ANGIOGRAM N/A 09/03/2012   Procedure: LEFT HEART CATHETERIZATION WITH CORONARY ANGIOGRAM;  Surgeon: Sinclair Grooms, MD;  Location: [facility] CATH LAB;  Service: Cardiovascular;  Laterality: N/A;  . LEFT HEART CATHETERIZATION WITH CORONARY ANGIOGRAM N/A 12/01/2013   Procedure: LEFT HEART CATHETERIZATION WITH CORONARY ANGIOGRAM;  Surgeon: Sinclair Grooms, MD;  Location: [facility] CATH LAB;  Service: Cardiovascular;  Laterality: N/A;  . LOWER EXTREMITY ANGIOGRAM Left 05/2005   L pop-pop BPG patent  . NAILBED REPAIR Right 09/17/2016   Procedure: ABLATION NAILBED RIGHT INDEX;  Surgeon: Daryll Brod, MD;  Location: [place];  Service: Orthopedics;  Laterality: Right;  . NASAL SEPTUM SURGERY  1977  . PENILE PROSTHESIS IMPLANT  1990's  . SAVORY DILATION N/A 09/28/2015   Procedure: SAVORY DILATION;  Surgeon: Clarene Essex, MD;  Location: [facility] ENDOSCOPY;  Service: Endoscopy;  Laterality: N/A;  have balloons available  . SAVORY DILATION N/A 09/09/2017   Procedure: SAVORY DILATION;  Surgeon:  Clarene Essex, MD;  Location: [facility] ENDOSCOPY;  Service: Endoscopy;  Laterality: N/A;  . SCROTAL SURGERY  08/1999   pump revision/notes 07/11/2010  . SKIN FULL THICKNESS GRAFT Right 09/17/2016   Procedure: SKIN GRAFT FROM UPPER ARM;  Surgeon: Daryll Brod, MD;  Location: [place];  Service: Orthopedics;  Laterality: Right;  . TOTAL KNEE ARTHROPLASTY Left 01/2009     Current Meds  Medication Sig  . aspirin EC 81 MG tablet Take 1 tablet (81 mg total) by mouth daily. Resume after 4 days.  . cholecalciferol (VITAMIN D) 1000 units tablet Take 2,000 Units by mouth daily.  . cyanocobalamin (,VITAMIN B-12,) 1000 MCG/ML injection Inject 1,000 mcg into the muscle every 30 (thirty) days. No specific day of the month. Administered by daughter.  . docusate sodium (COLACE) 100 MG capsule Take 100 mg by mouth 2 (two) times daily as needed for mild constipation.  . isosorbide mononitrate (IMDUR) 60 MG 24 hr tablet Take 1.5 tablets (90 mg total) by mouth daily.  . metoprolol tartrate (LOPRESSOR) 50 MG tablet Take 25 mg by mouth 2 (two) times daily.  . Multiple Vitamin (MULTIVITAMIN WITH MINERALS) TABS tablet Take 1 tablet by mouth daily.  . nitroGLYCERIN (NITROSTAT) 0.4 MG SL tablet Place 0.4 mg under the tongue 3 (three) times daily before meals. Patient takes 15 minutes before meals for esophageal spasms and as needed for chest pain  . pantoprazole (PROTONIX) 40 MG tablet Take 40 mg by mouth daily.  . ranolazine (RANEXA) 500 MG 12 hr tablet Take 1 tablet (500 mg total) by mouth 2 (two) times daily.  . rosuvastatin (CRESTOR) 10 MG tablet Take 10 mg by mouth daily.  . tamsulosin (FLOMAX) 0.4 MG CAPS capsule Take 0.4 mg by mouth at bedtime.      Allergies:   Doxycycline, Lipitor [atorvastatin], Other, Sulfa antibiotics, Sulfacetamide sodium, and Vytorin [ezetimibe-simvastatin]   Social History   Tobacco Use  . Smoking status: Current Every Day Smoker    Packs/day: 0.25    Years: 59.00    Pack  years: 14.75    Types: Cigarettes  . Smokeless tobacco: Former Systems developer    Types: Chew  . Tobacco comment: Smokes 1- 2 cigs a day since wife died 2015-06-29  Substance Use Topics  . Alcohol use: No    Alcohol/week: 0.0 standard drinks  . Drug use: No     Family Hx: The patient's family history includes Alzheimer's disease (age of onset: 79) in his mother; Diabetes in his daughter; Diabetes (age of onset: 28) in his father; Fibromyalgia (age of onset: 74) in  his sister; Hypertension in his son; Obesity in his daughter; Peripheral vascular disease in his father; Pneumonia in his father.  ROS:   Please see the history of present illness.     All other systems reviewed and are negative.  Prior CV studies:   The following studies were reviewed today:  LEFT HEART CATH AND CORONARY ANGIOGRAPHY6/2020  Conclusion    Prox RCA to Dist RCA lesion is 100% stenosed.  Mid LAD lesion is 30% stenosed.  The left ventricular ejection fraction is 50-55% by visual estimate.  There is mild left ventricular systolic dysfunction.  LV end diastolic pressure is mildly elevated.  Known chronic total occlusion of the proximal RCA with extensive collateralization to a large distal RCA with the left circumflex coronary artery supplying the posterior lateral vessels and LAD/septal perforating arteries supplying the PDA. There is an 80% stenosis in the continuation branch between the PDA and PLA takeoff.  Mild nonobstructive disease involving the LAD with 30% stenosis after the second diagonal vessel and normal left circumflex coronary artery.  Low normal LV function with EF estimated approximately 50%. There is a focal area of inferobasilar akinesis. The EDP is 18 mm Hg.  RECOMMENDATION: Medical therapy. The patient has been on nitrates and beta-blocker therapy and was recently started on Ranexa 500 mg twice a day. If he continues to increase symptoms would further titrate as an outpatient to 1000  mg twice a day. Continue aggressive lipid-lowering therapy. The patient is on rosuvastatin. Target LDL is less than 70.    Labs/Other Tests and Data Reviewed:    EKG:  No ECG reviewed.  Recent Labs: 08/17/2018: ALT 18; BUN 18; Creatinine, Ser 1.07; Hemoglobin 14.4; Platelets 179; Potassium 4.2; Sodium 135   Recent Lipid Panel Lab Results  Component Value Date/Time   CHOL 100 09/04/2012 05:15 AM   TRIG 136 09/04/2012 05:15 AM   HDL 31 (L) 09/04/2012 05:15 AM   CHOLHDL 3.2 09/04/2012 05:15 AM   LDLCALC 42 09/04/2012 05:15 AM    Wt Readings from Last 3 Encounters:  12/21/18 215 lb (97.5 kg)  11/17/18 212 lb 12.8 oz (96.5 kg)  08/18/18 230 lb (104.3 kg)     Objective:    Vital Signs:  BP 127/60   Ht 5\' 9"  (1.753 m)   Wt 215 lb (97.5 kg)   BMI 31.75 kg/m    VITAL SIGNS:  reviewed GEN:  no acute distress NEURO:  alert and oriented x 3, no obvious focal deficit PSYCH:  normal affect  ASSESSMENT & PLAN:    1.  CAD: -Known total occlusion of RCA with remote unsuccessful attempt at CTO in 2015, s/p recent cath for recurrent chest pain with stable findings -Placed back on Ranexa 500 mg -Continues to have anginal symptoms in which he uses SL NTG at least every day, also uses SL NTG for esophagus issues  -Continue ASA, Crestor 10 -Decrease metoprolol to 12.5 mg once daily>>see HPI -Antianginals include Imdur 90 mg daily, Ranexa 500 twice daily -Close follow-up in 1 month  2. Hypotension: -BP today 127/60 per daughter  -Discussed liberalizing salt in his diet -Has prescription given for medical grade compression stockings from last visit   -Discussed slow transitions from sitting and lying to standing positions -Patient to keep BP log and will have telephone follow-up in 1 month with patient and daughter  3.  HLD: -Last LDL -On statin therapy -Followed by PCP  4. Tobacco use: -Continues to smoke, cessation strongly encouraged -Not currently  followed by  pulmonology -Managed by PCP>> needs yearly chest CT  5.  Carotid artery disease: -Last study from 2019 with right and left ICA consistent with 1 to 39% stenosis and bilateral vertebral artery antegrade flow normal subclavian flow -Continue ASA, statin   COVID-19 Education: The signs and symptoms of COVID-19 were discussed with the patient and how to seek care for testing (follow up with PCP or arrange E-visit). The importance of social distancing was discussed today.  Time:   Today, I have spent 20 minutes with the patient with telehealth technology discussing the above problems.    Medication Adjustments/Labs and Tests Ordered: Current medicines are reviewed at length with the patient today.  Concerns regarding medicines are outlined above.   Tests Ordered: No orders of the defined types were placed in this encounter.   Medication Changes: No orders of the defined types were placed in this encounter.   Follow Up:  Virtual Visit  Myself in 1 month  Signed, Kathyrn Drown, NP  12/21/2018 2:44 PM    [place]

## 2018-12-21 ENCOUNTER — Other Ambulatory Visit: Payer: Self-pay

## 2018-12-21 ENCOUNTER — Telehealth (INDEPENDENT_AMBULATORY_CARE_PROVIDER_SITE_OTHER): Payer: Medicare Other | Admitting: Cardiology

## 2018-12-21 VITALS — BP 127/60 | Ht 69.0 in | Wt 215.0 lb

## 2018-12-21 DIAGNOSIS — J439 Emphysema, unspecified: Secondary | ICD-10-CM | POA: Diagnosis not present

## 2018-12-21 DIAGNOSIS — E785 Hyperlipidemia, unspecified: Secondary | ICD-10-CM

## 2018-12-21 DIAGNOSIS — I25110 Atherosclerotic heart disease of native coronary artery with unstable angina pectoris: Secondary | ICD-10-CM

## 2018-12-21 DIAGNOSIS — I951 Orthostatic hypotension: Secondary | ICD-10-CM

## 2018-12-21 DIAGNOSIS — I10 Essential (primary) hypertension: Secondary | ICD-10-CM | POA: Diagnosis not present

## 2018-12-21 MED ORDER — METOPROLOL TARTRATE 25 MG PO TABS: 12.5000 mg | ORAL_TABLET | Freq: Two times a day (BID) | ORAL | 2 refills | Status: DC

## 2018-12-21 NOTE — Patient Instructions (Addendum)
Medication Instructions:  Your physician has recommended you make the following change in your medication: Decrease metoprolol tartrate to 12.5 mg by mouth twice daily.   *If you need a refill on your cardiac medications before your next appointment, please call your pharmacy*  Lab Work: none If you have labs (blood work) drawn today and your tests are completely normal, you will receive your results only by: Marland Kitchen MyChart Message (if you have MyChart) OR . A paper copy in the mail If you have any lab test that is abnormal or we need to change your treatment, we will call you to review the results.  Testing/Procedures: none  Follow-Up: At [facility], you and your health needs are our priority.  As part of our continuing mission to provide you with exceptional heart care, we have created designated Provider Care Teams.  These Care Teams include your primary Cardiologist (physician) and Advanced Practice Providers (APPs -  Physician Assistants and Nurse Practitioners) who all work together to provide you with the care you need, when you need it.  Your next appointment:   November 24,2020 at 11:00   The format for your next appointment:   virtual  Provider:   Kathyrn Drown  Other Instructions

## 2018-12-21 NOTE — Addendum Note (Signed)
Addended by: Thompson Grayer on: 12/21/2018 03:09 PM   Modules accepted: Orders

## 2019-01-11 NOTE — Progress Notes (Signed)
Virtual Visit via Telephone Note   This visit type was conducted due to national recommendations for restrictions regarding the COVID-19 Pandemic (e.g. social distancing) in an effort to limit this patient's exposure and mitigate transmission in our community.  Due to his co-morbid illnesses, this patient is at least at moderate risk for complications without adequate follow up.  This format is felt to be most appropriate for this patient at this time.  The patient did not have access to video technology/had technical difficulties with video requiring transitioning to audio format only (telephone).  All issues noted in this document were discussed and addressed.  No physical exam could be performed with this format.  Please refer to the patient's chart for his  consent to telehealth for [name].   Date:  01/11/2019   ID:  [name] Abu Sr., DOB Sep 09, 1940, MRN [id number]  Patient Location: Home Provider Location: Home  PCP:  [name], MD  Cardiologist:  Sinclair Grooms, MD  Electrophysiologist:  None   Evaluation Performed:  Follow-Up Visit  Chief Complaint: Follow-up, seen for Dr. Tamala [name]  History of Present Illness:    [name] BECHEL Sr. is a 78 y.o. male with a history of COPD, CAD with chronic total occlusion of RCA, essential hypertension, hyperlipidemia, obesity, obstructive sleep apnea and DM 2 with CKD along with memory issues and ongoing tobacco use.  In February patient was seen for an office visit following an ER visit at [facility] for sharp, left sternal chest discomfort for greater than 10 hours. Cardiac work-up was negative but was recommended to be admitted however patient refused. He had a brief return of symptoms although could not compared to prior anginal discomfort. He was previously prescribed Ranexa but had not been taking for quite some time otherwise medical therapy was continued.  He was then seen for a telehealth visit 07/2018 in  which he was having more angina symptoms.  He ended up being seen in-office and was referred for cardiac catheterization performed 08/10/2018 with recommendations for continued medical management. Ranexa was then added back.  On 08/17/2018 he was seen in the emergency department with right abdominal pain in which a CT scan was obtained that noted a left renal stone.  On 08/18/18 he was seen once again for telehealth for post cath visit in which he reported his chest pain had improved stating that it was not as severe or as frequent as it had been in the past. He was using NTG routinely for his esophagus. Overall with no real concern.  10/2018 the patient seen by PCP for dizziness in which meclizine was prescribed. His daughter reported BPs were more elevated than his baseline. BPs at 160-170/ 70-80s.  Daughter reported BP normally runs low in the 100s/60s-70s. Dr. Tamala [name] made aware in which he did not think this was cardiac in nature as he was not having shortness of breath or chest pain.  He was then seen by myself on 11/17/2018 for follow-up.  At that time, he had recently been seen by his PCP which his BPs were persistently low therefore his metoprolol was reduced from 50 mg twice daily to 25 mg twice daily.  Continued to be low on office exam.  He had no symptoms. He continued to have anginal symptoms due to chronic CTO.  He was maintained on Imdur and Ranexa as well as sublingual nitroglycerin which he takes for esophagus issues and as needed for acute chest pain.  We discussed  reducing his metoprolol even further however he just got a prescription refilled for 50 mg tablets for 90-day supply in which he is currently cutting those tablets in half.  We will have him take 25 mg once daily at night for now. Ideal dose would be 12.5 mg twice daily if BP tolerates. Also discussed liberalizing salt in his diet as well as compression stockings.  Prescription for medical grade compression stockings given.     Today, he reports that his BP is improved.  His metoprolol was further decreased by PCP to 12.5 mg twice daily.  Still having some labile blood pressures however more stable than last telemedicine visit.  Most SBP's are ranging in the 120-130 range.  He continues to have chronic anginal symptoms.  He states some days are better than others.  He did not have to take any additional nitroglycerin tablets yesterday however had to take up to 6-8 over the weekend.  Denies shortness of breath, LE swelling, orthopnea, dizziness, presyncopal or syncopal episodes.  Keeping himself safe from Covid including masking, handwashing and social distancing.   The patient does not have symptoms concerning for COVID-19 infection (fever, chills, cough, or new shortness of breath).    Past Medical History:  Diagnosis Date   Adenomatous polyp 2007   Arthritis    "my body"   B12 deficiency    BPH (benign prostatic hyperplasia)    Chronic back pain    "all over"   Chronic low back pain    Coronary artery disease 2009   DDD (degenerative disc disease), cervical    DDD (degenerative disc disease), lumbosacral    DDD (degenerative disc disease), thoracic    Depression    Diabetes mellitus without complication (HCC)    Type II   Gastroesophageal reflux    Gynecomastia 06/2009   Hyperglycemia    Hyperlipidemia LDL goal < 70    MI (myocardial infarction) ([place]) 08/1989; 1993   OSA on CPAP    "have mask; don't wear it" (02/02/2014) 09/25/15- now has a BiPAp- doesn'tt use it   Peptic ulcer disease 1959   Right bundle branch block    Shortness of breath dyspnea    Tobacco abuse    Vertebral compression fracture ([place]) 1970   Past Surgical History:  Procedure Laterality Date   BYPASS GRAFT POPLITEAL TO POPLITEAL Left 06/2005   Dr Donnetta Hutching   BYPASS GRAFT POPLITEAL TO POPLITEAL Right 02/2001   Dr Deon Pilling   CARDIAC CATHETERIZATION  1991; 1993; 08/2012; 11/2013; 02/02/2014   I've had 6-7 total    CARDIAC CATHETERIZATION  02/02/2014   Procedure: LEFT HEART CATH AND CORONARY ANGIOGRAPHY;  Surgeon: Jettie Booze, MD;  mild disease in LAD/CFX, RCA w/ CTO, unsuccessfull PCI both w/ antegrade and retrograde approaches    CATARACT EXTRACTION W/ INTRAOCULAR LENS  IMPLANT, BILATERAL Bilateral ~ 2012   COLONOSCOPY W/ POLYPECTOMY     CORONARY ANGIOPLASTY  1990's   ESOPHAGOGASTRODUODENOSCOPY (EGD) WITH ESOPHAGEAL DILATION  11/2007   ESOPHAGOGASTRODUODENOSCOPY (EGD) WITH PROPOFOL N/A 09/28/2015   Procedure: ESOPHAGOGASTRODUODENOSCOPY (EGD) WITH PROPOFOL;  Surgeon: Clarene Essex, MD;  Location: [facility] ENDOSCOPY;  Service: Endoscopy;  Laterality: N/A;   ESOPHAGOGASTRODUODENOSCOPY (EGD) WITH PROPOFOL N/A 09/09/2017   Procedure: ESOPHAGOGASTRODUODENOSCOPY (EGD) WITH PROPOFOL;  Surgeon: Clarene Essex, MD;  Location: [facility] ENDOSCOPY;  Service: Endoscopy;  Laterality: N/A;   EYE SURGERY Bilateral    JOINT REPLACEMENT     LEFT HEART CATH AND CORONARY ANGIOGRAPHY N/A 08/10/2018   Procedure: LEFT HEART CATH AND  CORONARY ANGIOGRAPHY;  Surgeon: Troy Sine, MD;  Location: [place] CV LAB;  Service: Cardiovascular;  Laterality: N/A;   LEFT HEART CATHETERIZATION WITH CORONARY ANGIOGRAM N/A 09/03/2012   Procedure: LEFT HEART CATHETERIZATION WITH CORONARY ANGIOGRAM;  Surgeon: Sinclair Grooms, MD;  Location: [name] C[facility] LAB;  Service: Cardiovascular;  Laterality: N/A;   LEFT HEART CATHETERIZATION WITH CORONARY ANGIOGRAM N/A 12/01/2013   Procedure: LEFT HEART CATHETERIZATION WITH CORONARY ANGIOGRAM;  Surgeon: Sinclair Grooms, MD;  Location: [name] C[facility] LAB;  Service: Cardiovascular;  Laterality: N/A;   LOWER EXTREMITY ANGIOGRAM Left 05/2005   L pop-pop BPG patent   NAILBED REPAIR Right 09/17/2016   Procedure: ABLATION NAILBED RIGHT INDEX;  Surgeon: Daryll Brod, MD;  Location: [place];  Service: Orthopedics;  Laterality: Right;   NASAL SEPTUM SURGERY  1977   PENILE PROSTHESIS IMPLANT  1990's    SAVORY DILATION N/A 09/28/2015   Procedure: SAVORY DILATION;  Surgeon: Clarene Essex, MD;  Location: [facility] ENDOSCOPY;  Service: Endoscopy;  Laterality: N/A;  have balloons available   SAVORY DILATION N/A 09/09/2017   Procedure: SAVORY DILATION;  Surgeon: Clarene Essex, MD;  Location: [facility] ENDOSCOPY;  Service: Endoscopy;  Laterality: N/A;   SCROTAL SURGERY  08/1999   pump revision/notes 07/11/2010   SKIN FULL THICKNESS GRAFT Right 09/17/2016   Procedure: SKIN GRAFT FROM UPPER ARM;  Surgeon: Daryll Brod, MD;  Location: [place];  Service: Orthopedics;  Laterality: Right;   TOTAL KNEE ARTHROPLASTY Left 01/2009     No outpatient medications have been marked as taking for the 01/19/19 encounter (Appointment) with Tommie Raymond, NP.     Allergies:   Doxycycline, Lipitor [atorvastatin], Other, Sulfa antibiotics, Sulfacetamide sodium, and Vytorin [ezetimibe-simvastatin]   Social History   Tobacco Use   Smoking status: Current Every Day Smoker    Packs/day: 0.25    Years: 59.00    Pack years: 14.75    Types: Cigarettes   Smokeless tobacco: Former Systems developer    Types: Chew   Tobacco comment: Smokes 1- 2 cigs a day since wife died 07-06-2015  Substance Use Topics   Alcohol use: No    Alcohol/week: 0.0 standard drinks   Drug use: No     Family Hx: The patient's family history includes Alzheimer's disease (age of onset: 64) in his mother; Diabetes in his daughter; Diabetes (age of onset: 39) in his father; Fibromyalgia (age of onset: 28) in his sister; Hypertension in his son; Obesity in his daughter; Peripheral vascular disease in his father; Pneumonia in his father.  ROS:   Please see the history of present illness.     All other systems reviewed and are negative.  Prior CV studies:   The following studies were reviewed today:  LEFT HEART CATH AND CORONARY ANGIOGRAPHY6/2020  Conclusion    Prox RCA to Dist RCA lesion is 100% stenosed.  Mid LAD lesion is 30%  stenosed.  The left ventricular ejection fraction is 50-55% by visual estimate.  There is mild left ventricular systolic dysfunction.  LV end diastolic pressure is mildly elevated.  Known chronic total occlusion of the proximal RCA with extensive collateralization to a large distal RCA with the left circumflex coronary artery supplying the posterior lateral vessels and LAD/septal perforating arteries supplying the PDA. There is an 80% stenosis in the continuation branch between the PDA and PLA takeoff.  Mild nonobstructive disease involving the LAD with 30% stenosis after the second diagonal vessel and normal left circumflex coronary  artery.  Low normal LV function with EF estimated approximately 50%. There is a focal area of inferobasilar akinesis. The EDP is 18 mm Hg.  RECOMMENDATION: Medical therapy. The patient has been on nitrates and beta-blocker therapy and was recently started on Ranexa 500 mg twice a day. If he continues to increase symptoms would further titrate as an outpatient to 1000 mg twice a day. Continue aggressive lipid-lowering therapy. The patient is on rosuvastatin. Target LDL is less than 70.     Carotid Doppler assessment August 27, 2017: Final Interpretation: Right Carotid: Velocities in the right ICA are consistent with a 1-39% stenosis.  Left Carotid: Velocities in the left ICA are consistent with a 1-39% stenosis.  Vertebrals: Bilateral vertebral arteries demonstrate antegrade flow. Subclavians: Normal flow hemodynamics were seen in bilateral subclavianarteries.   CARDIAC CATHIMPRESSIONS10/2015:1. Chronic total occlusion of the RCA which is well collateralized from the left circumflex and the LAD cell perforators. 2. Widely patent RCA and circumflex coronary arteries 3. Diastolic heart failure with LVEDP 28 mmHg 4. Class III angina pectoris   RECOMMENDATION:Intensify medical therapy. Decrease LVEDP to hopefully improve angina  via the [place] of [place]. We'll double beta blocker to metoprolol 50 mg twice a day; change to isosorbide mononitrate 60 mg per day; add low-dose diuretic therapy.  Will refer to the CT oh team, [name].  Labs/Other Tests and Data Reviewed:    EKG:  No ECG reviewed.  Recent Labs: 08/17/2018: ALT 18; BUN 18; Creatinine, Ser 1.07; Hemoglobin 14.4; Platelets 179; Potassium 4.2; Sodium 135   Recent Lipid Panel Lab Results  Component Value Date/Time   CHOL 100 09/04/2012 05:15 AM   TRIG 136 09/04/2012 05:15 AM   HDL 31 (L) 09/04/2012 05:15 AM   CHOLHDL 3.2 09/04/2012 05:15 AM   LDLCALC 42 09/04/2012 05:15 AM    Wt Readings from Last 3 Encounters:  12/21/18 215 lb (97.5 kg)  11/17/18 212 lb 12.8 oz (96.5 kg)  08/18/18 230 lb (104.3 kg)     Objective:    Vital Signs:  There were no vitals taken for this visit.   VITAL SIGNS:  reviewed GEN:  no acute distress NEURO:  alert and oriented x 3, no obvious focal deficit PSYCH:  normal affect  ASSESSMENT & PLAN:    1.  CAD: -Total occlusion of RCA with remote unsuccessful attempt at CTO in 2015, s/p recent cath for recurrent chest pain with stable findings -Placed back on Ranexa 500 mg -Continues to have anginal symptoms in which he uses SL NTG at least every day, also uses SL NTG for esophagus issues>> stable  -Continue ASA, Crestor 10 -Continue low-dose of metoprolol 12.5 mg twice daily>>> BPs improved -Antianginals include Imdur 90 mg daily, Ranexa 500 twice daily  2. Hypotension: -BP, 140/82 per patient report -Denies dizziness, syncope or falls -Continue metoprolol 12.5 mg twice daily -Discussed liberalizing salt in his diet -Prescription given for medical grade compression stockings at last telemedicine visit>> has not yet obtained  -Discussed slow transitions from sitting and lying to standing positions  3.  HLD: -Last LDL -On statin therapy -Followed by PCP  4. Tobacco use: -Continues to smoke, cessation  strongly encouraged -Not currently followed by pulmonology -Managed by PCP>> needs yearly chest CT  5.  Carotid artery disease: -Last study from 2019 with right and left ICA consistent with 1 to 39% stenosis and bilateral vertebral artery antegrade flow normal subclavian flow -Continue ASA, statin   COVID-19 Education: The signs and symptoms of COVID-19  were discussed with the patient and how to seek care for testing (follow up with PCP or arrange E-visit). The importance of social distancing was discussed today.  Time:   Today, I have spent  20 minutes with the patient with telehealth technology discussing the above problems.     Medication Adjustments/Labs and Tests Ordered: Current medicines are reviewed at length with the patient today.  Concerns regarding medicines are outlined above.   Tests Ordered: No orders of the defined types were placed in this encounter.   Medication Changes: No orders of the defined types were placed in this encounter.   Follow Up:  Either In Person or Virtual Dr. Tamala [name] or myself in 3 to 4 months  Signed, Kathyrn Drown, NP  01/11/2019 11:47 AM    [place]

## 2019-01-19 ENCOUNTER — Encounter: Payer: Self-pay | Admitting: Cardiology

## 2019-01-19 ENCOUNTER — Telehealth (INDEPENDENT_AMBULATORY_CARE_PROVIDER_SITE_OTHER): Payer: Medicare Other | Admitting: Cardiology

## 2019-01-19 ENCOUNTER — Other Ambulatory Visit: Payer: Self-pay

## 2019-01-19 VITALS — Ht 69.0 in | Wt 225.0 lb

## 2019-01-19 DIAGNOSIS — Z720 Tobacco use: Secondary | ICD-10-CM

## 2019-01-19 DIAGNOSIS — J439 Emphysema, unspecified: Secondary | ICD-10-CM | POA: Diagnosis not present

## 2019-01-19 DIAGNOSIS — I951 Orthostatic hypotension: Secondary | ICD-10-CM

## 2019-01-19 DIAGNOSIS — I10 Essential (primary) hypertension: Secondary | ICD-10-CM | POA: Diagnosis not present

## 2019-01-19 DIAGNOSIS — I25110 Atherosclerotic heart disease of native coronary artery with unstable angina pectoris: Secondary | ICD-10-CM

## 2019-01-19 NOTE — Patient Instructions (Signed)
Medication Instructions:   Your physician recommends that you continue on your current medications as directed. Please refer to the Current Medication list given to you today.  *If you need a refill on your cardiac medications before your next appointment, please call your pharmacy*  Lab Work:  None ordered today  Testing/Procedures:  None ordered today  Follow-Up:  On 04/23/2019 at 8:40AM with [name], MD

## 2019-02-22 ENCOUNTER — Ambulatory Visit
Admission: RE | Admit: 2019-02-22 | Discharge: 2019-02-22 | Disposition: A | Discharge status: Home / Self Care | Payer: Medicare Other | Source: Ambulatory Visit | Attending: Geriatric Medicine | Admitting: Geriatric Medicine

## 2019-02-22 ENCOUNTER — Other Ambulatory Visit: Payer: Self-pay | Admitting: Geriatric Medicine

## 2019-02-22 DIAGNOSIS — R062 Wheezing: Secondary | ICD-10-CM

## 2019-03-15 ENCOUNTER — Other Ambulatory Visit: Payer: Self-pay

## 2019-03-15 ENCOUNTER — Ambulatory Visit: Payer: Medicare Other | Admitting: Podiatry

## 2019-03-15 DIAGNOSIS — M79675 Pain in left toe(s): Secondary | ICD-10-CM

## 2019-03-15 DIAGNOSIS — B351 Tinea unguium: Secondary | ICD-10-CM | POA: Diagnosis not present

## 2019-03-15 DIAGNOSIS — M79674 Pain in right toe(s): Secondary | ICD-10-CM

## 2019-03-15 DIAGNOSIS — T148XXA Other injury of unspecified body region, initial encounter: Secondary | ICD-10-CM

## 2019-03-15 NOTE — Progress Notes (Signed)
Subjective: 79 y.o. returns the office today for painful, elongated, thickened toenails which he cannot trim himself. Denies any redness or drainage around the nails.  He also states he scraped the top of his left foot on a cabinet.  Has been keeping antibiotic ointment and a bandage on it since been getting better.  Denies any acute changes since last appointment and no new complaints today. Denies any systemic complaints such as fevers, chills, nausea, vomiting.   PCP: [name], MD  Objective: AAO 3, NAD DP 1/4, PT 2/4, CRT less than 3 seconds Nails hypertrophic, dystrophic, elongated, brittle, discolored 10. There is tenderness overlying the nails 1-5 bilaterally. There is no surrounding erythema or drainage along the nail sites. Small superficial abrasion present the dorsal aspect the left foot without any signs of infection. No open lesions or pre-ulcerative lesions are identified. No other areas of tenderness bilateral lower extremities. No overlying edema, erythema, increased warmth. No pain with calf compression, swelling, warmth, erythema.  Assessment: Patient presents with symptomatic onychomycosis; skin abrasion  Plan: -Treatment options including alternatives, risks, complications were discussed -Nails sharply debrided 10 without complication/bleeding. -Recommend antibiotic ointment dressing changes daily in the left side.  Monitor closely for any signs or symptoms of infection.  Not healed the next 1 to 2 weeks to let me know or sooner if there is any issues. -Discussed daily foot inspection. If there are any changes, to call the office immediately.  -Follow-up in 3 months or sooner if any problems are to arise. In the meantime, encouraged to call the office with any questions, concerns, changes symptoms.  Celesta Gentile, DPM

## 2019-04-09 DIAGNOSIS — M2560 Stiffness of unspecified joint, not elsewhere classified: Secondary | ICD-10-CM | POA: Insufficient documentation

## 2019-04-09 DIAGNOSIS — M79642 Pain in left hand: Secondary | ICD-10-CM | POA: Insufficient documentation

## 2019-04-12 ENCOUNTER — Other Ambulatory Visit: Payer: Self-pay

## 2019-04-12 DIAGNOSIS — R202 Paresthesia of skin: Secondary | ICD-10-CM

## 2019-04-22 NOTE — Progress Notes (Signed)
Cardiology Office Note:    Date:  04/23/2019   ID:  [name] Abu Sr., DOB Feb 03, 1941, MRN [id number]  PCP:  [name], MD  Cardiologist:  Sinclair Grooms, MD   Referring MD: [name], MD   Chief Complaint  Patient presents with  . Coronary Artery Disease    History of Present Illness:    [name] MCGONIGLE Sr. is a 79 y.o. male with a hx of COPD, CAD with chronic total occlusion of RCA, essential hypertension, hyperlipidemia, obesity, obstructive sleep apnea and DM 2 with CKD along with memory issues and ongoing tobacco use.  Increasing episodes of angina.  1 or 2 episodes daily relieved by nitroglycerin after 3 to 5 minutes.  Episodes can occur with exertion but also at rest.  This pattern is slightly more frequent than 6 or 12 months ago.  There is no associated dyspnea.  He is still active around his house.  He still drives and functions independently.  He has had no prolonged spell of discomfort.  Past Medical History:  Diagnosis Date  . Adenomatous polyp 2007  . Arthritis    "my body"  . B12 deficiency   . BPH (benign prostatic hyperplasia)   . Chronic back pain    "all over"  . Chronic low back pain   . Coronary artery disease 2009  . DDD (degenerative disc disease), cervical   . DDD (degenerative disc disease), lumbosacral   . DDD (degenerative disc disease), thoracic   . Depression   . Diabetes mellitus without complication ([place])    Type II  . Gastroesophageal reflux   . Gynecomastia 06/2009  . Hyperglycemia   . Hyperlipidemia LDL goal < 70   . MI (myocardial infarction) ([place]) 08/1989; 1993  . OSA on CPAP    "have mask; don't wear it" (02/02/2014) 09/25/15- now has a BiPAp- doesn'tt use it  . Peptic ulcer disease 1959  . Right bundle branch block   . Shortness of breath dyspnea   . Tobacco abuse   . Vertebral compression fracture ([place]) 1970    Past Surgical History:  Procedure Laterality Date  . BYPASS GRAFT POPLITEAL TO POPLITEAL Left 06/2005   Dr  Donnetta Hutching  . BYPASS GRAFT POPLITEAL TO POPLITEAL Right 02/2001   Dr Deon Pilling  . CARDIAC CATHETERIZATION  1991; 1993; 08/2012; 11/2013; 02/02/2014   I've had 6-7 total  . CARDIAC CATHETERIZATION  02/02/2014   Procedure: LEFT HEART CATH AND CORONARY ANGIOGRAPHY;  Surgeon: Jettie Booze, MD;  mild disease in LAD/CFX, RCA w/ CTO, unsuccessfull PCI both w/ antegrade and retrograde approaches   . CATARACT EXTRACTION W/ INTRAOCULAR LENS  IMPLANT, BILATERAL Bilateral ~ 2012  . COLONOSCOPY W/ POLYPECTOMY    . CORONARY ANGIOPLASTY  1990's  . ESOPHAGOGASTRODUODENOSCOPY (EGD) WITH ESOPHAGEAL DILATION  11/2007  . ESOPHAGOGASTRODUODENOSCOPY (EGD) WITH PROPOFOL N/A 09/28/2015   Procedure: ESOPHAGOGASTRODUODENOSCOPY (EGD) WITH PROPOFOL;  Surgeon: Clarene Essex, MD;  Location: [facility] ENDOSCOPY;  Service: Endoscopy;  Laterality: N/A;  . ESOPHAGOGASTRODUODENOSCOPY (EGD) WITH PROPOFOL N/A 09/09/2017   Procedure: ESOPHAGOGASTRODUODENOSCOPY (EGD) WITH PROPOFOL;  Surgeon: Clarene Essex, MD;  Location: [facility] ENDOSCOPY;  Service: Endoscopy;  Laterality: N/A;  . EYE SURGERY Bilateral   . JOINT REPLACEMENT    . LEFT HEART CATH AND CORONARY ANGIOGRAPHY N/A 08/10/2018   Procedure: LEFT HEART CATH AND CORONARY ANGIOGRAPHY;  Surgeon: Troy Sine, MD;  Location: [place] CV LAB;  Service: Cardiovascular;  Laterality: N/A;  . LEFT HEART CATHETERIZATION WITH CORONARY ANGIOGRAM N/A  09/03/2012   Procedure: LEFT HEART CATHETERIZATION WITH CORONARY ANGIOGRAM;  Surgeon: Sinclair Grooms, MD;  Location: [facility] CATH LAB;  Service: Cardiovascular;  Laterality: N/A;  . LEFT HEART CATHETERIZATION WITH CORONARY ANGIOGRAM N/A 12/01/2013   Procedure: LEFT HEART CATHETERIZATION WITH CORONARY ANGIOGRAM;  Surgeon: Sinclair Grooms, MD;  Location: [facility] CATH LAB;  Service: Cardiovascular;  Laterality: N/A;  . LOWER EXTREMITY ANGIOGRAM Left 05/2005   L pop-pop BPG patent  . NAILBED REPAIR Right 09/17/2016   Procedure: ABLATION NAILBED RIGHT INDEX;  Surgeon:  Daryll Brod, MD;  Location: [place];  Service: Orthopedics;  Laterality: Right;  . NASAL SEPTUM SURGERY  1977  . PENILE PROSTHESIS IMPLANT  1990's  . SAVORY DILATION N/A 09/28/2015   Procedure: SAVORY DILATION;  Surgeon: Clarene Essex, MD;  Location: [facility] ENDOSCOPY;  Service: Endoscopy;  Laterality: N/A;  have balloons available  . SAVORY DILATION N/A 09/09/2017   Procedure: SAVORY DILATION;  Surgeon: Clarene Essex, MD;  Location: [facility] ENDOSCOPY;  Service: Endoscopy;  Laterality: N/A;  . SCROTAL SURGERY  08/1999   pump revision/notes 07/11/2010  . SKIN FULL THICKNESS GRAFT Right 09/17/2016   Procedure: SKIN GRAFT FROM UPPER ARM;  Surgeon: Daryll Brod, MD;  Location: [place];  Service: Orthopedics;  Laterality: Right;  . TOTAL KNEE ARTHROPLASTY Left 01/2009    Current Medications: Current Meds  Medication Sig  . aspirin EC 81 MG tablet Take 1 tablet (81 mg total) by mouth daily. Resume after 4 days.  Marland Kitchen buPROPion (WELLBUTRIN) 100 MG tablet Take 100 mg by mouth 2 (two) times daily.  . cholecalciferol (VITAMIN D) 1000 units tablet Take 2,000 Units by mouth daily.  . cyanocobalamin (,VITAMIN B-12,) 1000 MCG/ML injection Inject 1,000 mcg into the muscle every 30 (thirty) days. No specific day of the month. Administered by daughter.  . docusate sodium (COLACE) 100 MG capsule Take 100 mg by mouth 2 (two) times daily as needed for mild constipation.  Marland Kitchen HYDROcodone-acetaminophen (NORCO/VICODIN) 5-325 MG tablet Take 1 tablet by mouth every 6 (six) hours as needed for severe pain.  . isosorbide mononitrate (IMDUR) 60 MG 24 hr tablet Take 1.5 tablets (90 mg total) by mouth daily.  . meloxicam (MOBIC) 7.5 MG tablet Take 7.5 mg by mouth daily.  . metoprolol tartrate (LOPRESSOR) 25 MG tablet Take 0.5 tablets (12.5 mg total) by mouth 2 (two) times daily.  . Multiple Vitamin (MULTIVITAMIN WITH MINERALS) TABS tablet Take 1 tablet by mouth daily.  . nitroGLYCERIN (NITROSTAT) 0.4 MG SL  tablet Place 0.4 mg under the tongue 3 (three) times daily before meals. Patient takes 15 minutes before meals for esophageal spasms and as needed for chest pain  . pantoprazole (PROTONIX) 40 MG tablet Take 40 mg by mouth daily.  . rosuvastatin (CRESTOR) 10 MG tablet Take 10 mg by mouth daily.  . tamsulosin (FLOMAX) 0.4 MG CAPS capsule Take 0.4 mg by mouth at bedtime.   . [DISCONTINUED] ranolazine (RANEXA) 500 MG 12 hr tablet Take 1 tablet (500 mg total) by mouth 2 (two) times daily.     Allergies:   Doxycycline, Lipitor [atorvastatin], Other, Sulfa antibiotics, Sulfacetamide sodium, and Vytorin [ezetimibe-simvastatin]   Social History   Socioeconomic History  . Marital status: Widowed    Spouse name: Not on file  . Number of children: Not on file  . Years of education: Not on file  . Highest education level: Not on file  Occupational History  . Not on file  Tobacco  Use  . Smoking status: Current Every Day Smoker    Packs/day: 0.25    Years: 59.00    Pack years: 14.75    Types: Cigarettes  . Smokeless tobacco: Former Systems developer    Types: Chew  . Tobacco comment: Smokes 1- 2 cigs a day since wife died Jul 20, 2015  Substance and Sexual Activity  . Alcohol use: No    Alcohol/week: 0.0 standard drinks  . Drug use: No  . Sexual activity: Not on file  Other Topics Concern  . Not on file  Social History Narrative   Tobacco use cigarettes: former smoker, quit in year 2014. Pack-year Hx: 15, tobacco history last updated 05/12/2013, additional findings: tobacco user moderate cigarette smoker (10-19 cigs/day), additional findings: tobacco Non-User current non-smoker, no smoking. No alcohol, stopped 1981. 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 Marital status: married. 4 children.   Social Determinants of Health   Financial Resource Strain:   . Difficulty of Paying Living Expenses: Not on file  Food Insecurity:   . Worried About Paediatric nurse in the Last Year: Not on file  . Ran Out of Food in the Last Year: Not on file  Transportation Needs:   . Lack of Transportation (Medical): Not on file  . Lack of Transportation (Non-Medical): Not on file  Physical Activity:   . Days of Exercise per Week: Not on file  . Minutes of Exercise per Session: Not on file  Stress:   . Feeling of Stress : Not on file  Social Connections:   . Frequency of Communication with Friends and Family: Not on file  . Frequency of Social Gatherings with Friends and Family: Not on file  . Attends Religious Services: Not on file  . Active Member of Clubs or Organizations: Not on file  . Attends Archivist Meetings: Not on file  . Marital Status: Not on file     Family History: The patient's family history includes Alzheimer's disease (age of onset: 61) in his mother; Diabetes in his daughter; Diabetes (age of onset: 57) in his father; Fibromyalgia (age of onset: 29) in his sister; Hypertension in his son; Obesity in his daughter; Peripheral vascular disease in his father; Pneumonia in his father.  ROS:   Please see the history of present illness.    His ears ringing and he recently had difficulty with the left ear having a full feeling.  When that occurs he has vertigo.  He has chronic shortness of breath from COPD.  All other systems reviewed and are negative.  EKGs/Labs/Other Studies Reviewed:    The following studies were reviewed today:  Coronary angiography 2020: Diagnostic Dominance: Right    EKG:  EKG no new data  Recent Labs: 08/17/2018: ALT 18; BUN 18; Creatinine, Ser 1.07; Hemoglobin 14.4; Platelets 179; Potassium 4.2; Sodium 135  Recent Lipid Panel    Component Value Date/Time   CHOL 100 09/04/2012 0515   TRIG 136 09/04/2012 0515   HDL 31 (L) 09/04/2012 0515   CHOLHDL 3.2 09/04/2012 0515   VLDL 27 09/04/2012 0515   LDLCALC 42 09/04/2012 0515    Physical Exam:    VS:  BP 120/62   Pulse (!) 59   Ht 5\' 9"   (1.753 m)   Wt 200 lb 12.8 oz (91.1 kg)   SpO2 96%   BMI 29.65 kg/m     Wt Readings from Last 3 Encounters:  04/23/19 200 lb 12.8 oz (91.1  kg)  01/19/19 225 lb (102.1 kg)  12/21/18 215 lb (97.5 kg)     GEN: Healthy-appearing. No acute distress HEENT: Normal NECK: No JVD. LYMPHATICS: No lymphadenopathy CARDIAC:  RRR without murmur, gallop, or edema. VASCULAR:  Normal Pulses. No bruits. RESPIRATORY:  Clear to auscultation without rales, wheezing or rhonchi  ABDOMEN: Soft, non-tender, non-distended, No pulsatile mass, MUSCULOSKELETAL: No deformity  SKIN: Warm and dry NEUROLOGIC:  Alert and oriented x 3 PSYCHIATRIC:  Normal affect   ASSESSMENT:    1. Coronary artery disease involving native coronary artery of native heart with unstable angina pectoris ([place])   2. Essential hypertension   3. Tobacco abuse   4. Hyperlipidemia with target LDL less than 70   5. Right bundle branch block   6. Carotid artery disease, unspecified laterality, unspecified type ([place])   7. Educated about COVID-19 virus infection    PLAN:    In order of problems listed above:  1. Secondary prevention discussed.  Smoking cessation is highlighted.  He still smokes 3 to 5 cigarettes/day.  I asked him to determine if there is a correlation between smoking, and episodes of angina 15 to 30 minutes after using tobacco.  We will plan to increase Ranexa to 1000 mg twice daily to decrease the frequency of angina. 2. Blood pressure is well controlled.  Metoprolol dose has been decreased by the Pharm.D. program at [place] because of hypotension and weak spells.  This could be helping to contribute to angina on exertion.  It should not contribute to angina at rest. 3. Smoking cessation is requested.  No commitment. 4. Target LDL less than 70.  Most recent [id number]. 5. Not reassessed 6. Continue secondary risk factor modification 7. COVID-19 vaccine has been received.  3W's is being practiced.  Overall education and  awareness concerning primary/secondary risk prevention was discussed in detail: LDL less than 70, hemoglobin A1c less than 7, blood pressure target less than 130/80 mmHg, >150 minutes of moderate aerobic activity per week, avoidance of smoking, weight control (via diet and exercise), and continued surveillance/management of/for obstructive sleep apnea.    Medication Adjustments/Labs and Tests Ordered: Current medicines are reviewed at length with the patient today.  Concerns regarding medicines are outlined above.  No orders of the defined types were placed in this encounter.  No orders of the defined types were placed in this encounter.   There are no Patient Instructions on file for this visit.   Signed, Sinclair Grooms, MD  04/23/2019 9:14 AM    [place]

## 2019-04-23 ENCOUNTER — Encounter: Payer: Self-pay | Admitting: Interventional Cardiology

## 2019-04-23 ENCOUNTER — Other Ambulatory Visit: Payer: Self-pay

## 2019-04-23 ENCOUNTER — Ambulatory Visit: Payer: Medicare Other | Admitting: Interventional Cardiology

## 2019-04-23 VITALS — BP 120/62 | HR 59 | Ht 69.0 in | Wt 200.8 lb

## 2019-04-23 DIAGNOSIS — I10 Essential (primary) hypertension: Secondary | ICD-10-CM

## 2019-04-23 DIAGNOSIS — Z720 Tobacco use: Secondary | ICD-10-CM | POA: Diagnosis not present

## 2019-04-23 DIAGNOSIS — Z7189 Other specified counseling: Secondary | ICD-10-CM

## 2019-04-23 DIAGNOSIS — I779 Disorder of arteries and arterioles, unspecified: Secondary | ICD-10-CM

## 2019-04-23 DIAGNOSIS — I4510 Unspecified right bundle-branch block: Secondary | ICD-10-CM

## 2019-04-23 DIAGNOSIS — E785 Hyperlipidemia, unspecified: Secondary | ICD-10-CM | POA: Diagnosis not present

## 2019-04-23 DIAGNOSIS — I25110 Atherosclerotic heart disease of native coronary artery with unstable angina pectoris: Secondary | ICD-10-CM

## 2019-04-23 MED ORDER — RANOLAZINE ER 1000 MG PO TB12: 1000.0000 mg | ORAL_TABLET | Freq: Two times a day (BID) | ORAL | 3 refills | Status: DC

## 2019-04-23 NOTE — Patient Instructions (Signed)
Medication Instructions:  1) INCREASE Ranexa to 1000mg  twice daily  *If you need a refill on your cardiac medications before your next appointment, please call your pharmacy*   Lab Work: None If you have labs (blood work) drawn today and your tests are completely normal, you will receive your results only by: Marland Kitchen MyChart Message (if you have MyChart) OR . A paper copy in the mail If you have any lab test that is abnormal or we need to change your treatment, we will call you to review the results.   Testing/Procedures: None   Follow-Up: At [facility], you and your health needs are our priority.  As part of our continuing mission to provide you with exceptional heart care, we have created designated Provider Care Teams.  These Care Teams include your primary Cardiologist (physician) and Advanced Practice Providers (APPs -  Physician Assistants and Nurse Practitioners) who all work together to provide you with the care you need, when you need it.  We recommend signing up for the patient portal called "MyChart".  Sign up information is provided on this After Visit Summary.  MyChart is used to connect with patients for Virtual Visits (Telemedicine).  Patients are able to view lab/test results, encounter notes, upcoming appointments, etc.  Non-urgent messages can be sent to your provider as well.   To learn more about what you can do with MyChart, go to [email or web address].    Your next appointment:   6-9 month(s)  The format for your next appointment:   In Person  Provider:   You may see Sinclair Grooms, MD or one of the following Advanced Practice Providers on your designated Care Team:    Truitt Merle, NP  Cecilie Kicks, NP  Kathyrn Drown, NP    Other Instructions  Keep track of chest pain/discomfort episodes and call back in 2-4 weeks with how often this is occurring.

## 2019-05-04 ENCOUNTER — Ambulatory Visit (INDEPENDENT_AMBULATORY_CARE_PROVIDER_SITE_OTHER): Payer: Medicare Other | Admitting: Neurology

## 2019-05-04 ENCOUNTER — Other Ambulatory Visit: Payer: Self-pay

## 2019-05-04 DIAGNOSIS — R202 Paresthesia of skin: Secondary | ICD-10-CM | POA: Diagnosis not present

## 2019-05-04 DIAGNOSIS — G5603 Carpal tunnel syndrome, bilateral upper limbs: Secondary | ICD-10-CM

## 2019-05-04 NOTE — Procedures (Signed)
[facility]  [address]  [place], [place] [address] Tel: [phone] Fax:  [phone] Test Date:  05/04/2019  Patient: [name] [name]. DOB: [birth year] Physician: Narda Amber, DO  Sex: Male Height: 5\' 9"  Ref Phys: Daryll Brod, MD  ID#: [id number] Temp: 33.0C Technician:    Patient Complaints: This is a 79 year old man referred for evaluation of left hand pain and tingling.  NCV & EMG Findings: Extensive electrodiagnostic testing of the left upper extremity and additional studies of the right shows:  1. Bilateral median sensory responses are absent.  Bilateral ulnar sensory responses are within normal limits. 2. Left median motor response is absent.  Right median motor response shows prolonged latency (4.7 ms) and reduced amplitude (4.8 mV).  Bilateral ulnar motor responses are within normal limits.   3. Despite maximal activation, there is no motor unit recruitment in the left abductor pollicis brevis muscle.  Chronic motor axonal loss changes are seen in the right abductor pollicis brevis muscle   Impression: 1. Left median neuropathy at or distal to the wrist (very severe), consistent with a clinical diagnosis of carpal tunnel syndrome.   2. Right median neuropathy at or distal to the wrist (severe), consistent with a clinical diagnosis of carpal tunnel syndrome.   3. There is no evidence of a cervical radiculopathy affecting either upper extremity.   ___________________________ Narda Amber, DO    Nerve Conduction Studies Anti Sensory Summary Table   Stim Site NR Peak (ms) Norm Peak (ms) P-T Amp (V) Norm P-T Amp  Left Median Anti Sensory (2nd Digit)  33C  Wrist NR  <3.8  >10  Right Median Anti Sensory (2nd Digit)  33C  Wrist NR  <3.8  >10  Left Ulnar Anti Sensory (5th Digit)  33C  Wrist    3.0 <3.2 9.5 >5  Right Ulnar Anti Sensory (5th Digit)  33C  Wrist    2.9 <3.2 10.3 >5   Motor Summary Table   Stim Site NR Onset (ms) Norm Onset  (ms) O-P Amp (mV) Norm O-P Amp Site1 Site2 Delta-0 (ms) Dist (cm) Vel (m/s) Norm Vel (m/s)  Left Median Motor (Abd Poll Brev)  33C  Wrist NR  <4.0  >5 Elbow Wrist  0.0  >50  Elbow NR            Right Median Motor (Abd Poll Brev)  33C  Wrist    4.7 <4.0 4.8 >5 Elbow Wrist 6.3 32.0 51 >50  Elbow    11.0  4.5         Left Ulnar Motor (Abd Dig Minimi)  33C  Wrist    2.8 <3.1 9.2 >7 B Elbow Wrist 4.8 25.0 52 >50  B Elbow    7.6  8.7  A Elbow B Elbow 1.8 10.0 56 >50  A Elbow    9.4  8.6         Right Ulnar Motor (Abd Dig Minimi)  33C  Wrist    2.5 <3.1 9.0 >7 B Elbow Wrist 4.7 26.0 55 >50  B Elbow    7.2  8.5  A Elbow B Elbow 1.9 10.0 53 >50  A Elbow    9.1  8.1          EMG   Side Muscle Ins Act Fibs Psw Fasc Number Recrt Dur Dur. Amp Amp. Poly Poly. Comment  Left 1stDorInt Nml Nml Nml Nml Nml Nml Nml Nml Nml Nml Nml Nml N/A  Left Abd [place]  Brev Nml Nml Nml Nml NE None - - - - - - ATR  Left PronatorTeres Nml Nml Nml Nml Nml Nml Nml Nml Nml Nml Nml Nml N/A  Left Biceps Nml Nml Nml Nml Nml Nml Nml Nml Nml Nml Nml Nml N/A  Left Triceps Nml Nml Nml Nml Nml Nml Nml Nml Nml Nml Nml Nml N/A  Left Deltoid Nml Nml Nml Nml Nml Nml Nml Nml Nml Nml Nml Nml N/A  Right 1stDorInt Nml Nml Nml Nml Nml Nml Nml Nml Nml Nml Nml Nml N/A  Right Abd Poll Brev Nml Nml Nml Nml 1- Rapid Some 1+ Some 1+ Some 1+ N/A  Right PronatorTeres Nml Nml Nml Nml Nml Nml Nml Nml Nml Nml Nml Nml N/A      Waveforms:

## 2019-05-05 ENCOUNTER — Telehealth: Payer: Self-pay | Admitting: *Deleted

## 2019-05-05 NOTE — Telephone Encounter (Signed)
   [facility] Pre-operative Risk Assessment    Request for surgical clearance:  1. What type of surgery is being performed? LEFT CARPAL TUNNEL RELEASE   2. When is this surgery scheduled? TBD   3. What type of clearance is required (medical clearance vs. Pharmacy clearance to hold med vs. Both)? MEDICAL  4. Are there any medications that need to be held prior to surgery and how long? ASA    5. Practice name and name of physician performing surgery? [facility]; DR. [place]   6. What is your office phone number [phone]    7.   What is your office fax number [phone] ATTN: Cyndee Brightly  8.   Anesthesia type (None, local, MAC, general) ? IV REGIONAL FOREARM BLOCK   [name] [name] 05/05/2019, 10:40 AM  _________________________________________________________________   (provider comments below)

## 2019-05-06 ENCOUNTER — Other Ambulatory Visit: Payer: Self-pay | Admitting: Orthopedic Surgery

## 2019-05-06 NOTE — Telephone Encounter (Signed)
Dr. [name], you recently saw this pt with stable angina, was recently more frequent. Can you comment on clearance for Carpal Tunnel surgery. Looks like probably OK for this low risk procedure but wanted to check with you.

## 2019-05-07 NOTE — Telephone Encounter (Signed)
   Primary Cardiologist: Sinclair Grooms, MD  Chart reviewed as part of pre-operative protocol coverage. Given past medical history and time since last visit, based on ACC/AHA guidelines, [name] CATCHINGS Sr. would be at acceptable risk for the planned procedure without further cardiovascular testing.   I will route this recommendation to the requesting party via Epic fax function and remove from pre-op pool.  Please call with questions.  Daune Perch, NP 05/07/2019, 1:56 PM

## 2019-05-07 NOTE — Telephone Encounter (Signed)
   Primary Cardiologist: Sinclair Grooms, MD  Chart reviewed as part of pre-operative protocol coverage. Given past medical history and time since last visit, based on ACC/AHA guidelines, [name] KLEPACKI Sr. would be at acceptable risk for the planned procedure without further cardiovascular testing.   Regarding ASA therapy, we recommend continuation of ASA throughout the perioperative period.  However, if the surgeon feels that cessation of ASA is required in the perioperative period, it may be stopped 5-7 days prior to surgery with a plan to resume it as soon as felt to be feasible from a surgical standpoint in the post-operative period.  I will route this recommendation to the requesting party via Epic fax function and remove from pre-op pool.  Please call with questions.  Daune Perch, NP 05/07/2019, 1:58 PM

## 2019-05-07 NOTE — Telephone Encounter (Signed)
It is safe to proceed with carpal tunnel surgery

## 2019-05-24 ENCOUNTER — Telehealth: Payer: Self-pay | Admitting: Interventional Cardiology

## 2019-05-24 ENCOUNTER — Encounter (HOSPITAL_BASED_OUTPATIENT_CLINIC_OR_DEPARTMENT_OTHER): Payer: Self-pay | Admitting: Orthopedic Surgery

## 2019-05-24 ENCOUNTER — Other Ambulatory Visit: Payer: Self-pay

## 2019-05-24 NOTE — Telephone Encounter (Signed)
Daughter called to verify we received a call for medical clerance on this patient - confirmed it was received.

## 2019-05-28 ENCOUNTER — Other Ambulatory Visit (HOSPITAL_COMMUNITY)
Admission: RE | Admit: 2019-05-28 | Discharge: 2019-05-28 | Disposition: A | Discharge status: Home / Self Care | Payer: Medicare Other | Source: Ambulatory Visit | Attending: Orthopedic Surgery | Admitting: Orthopedic Surgery

## 2019-05-28 DIAGNOSIS — Z20822 Contact with and (suspected) exposure to covid-19: Secondary | ICD-10-CM | POA: Insufficient documentation

## 2019-05-28 DIAGNOSIS — Z01812 Encounter for preprocedural laboratory examination: Secondary | ICD-10-CM | POA: Insufficient documentation

## 2019-05-28 LAB — SARS CORONAVIRUS 2 (TAT 6-24 HRS): SARS Coronavirus 2: NEGATIVE

## 2019-06-01 ENCOUNTER — Encounter (HOSPITAL_BASED_OUTPATIENT_CLINIC_OR_DEPARTMENT_OTHER)
Admission: RE | Disposition: A | Discharge status: Home / Self Care | Payer: Self-pay | Source: Home / Self Care | Attending: Orthopedic Surgery

## 2019-06-01 ENCOUNTER — Other Ambulatory Visit: Payer: Self-pay

## 2019-06-01 ENCOUNTER — Ambulatory Visit (HOSPITAL_BASED_OUTPATIENT_CLINIC_OR_DEPARTMENT_OTHER): Payer: Medicare Other | Admitting: Anesthesiology

## 2019-06-01 ENCOUNTER — Encounter (HOSPITAL_BASED_OUTPATIENT_CLINIC_OR_DEPARTMENT_OTHER): Payer: Self-pay | Admitting: Orthopedic Surgery

## 2019-06-01 ENCOUNTER — Ambulatory Visit (HOSPITAL_BASED_OUTPATIENT_CLINIC_OR_DEPARTMENT_OTHER)
Admission: RE | Admit: 2019-06-01 | Discharge: 2019-06-01 | Disposition: A | Discharge status: Home / Self Care | Payer: Medicare Other | Attending: Orthopedic Surgery | Admitting: Orthopedic Surgery

## 2019-06-01 DIAGNOSIS — E1151 Type 2 diabetes mellitus with diabetic peripheral angiopathy without gangrene: Secondary | ICD-10-CM | POA: Insufficient documentation

## 2019-06-01 DIAGNOSIS — Z882 Allergy status to sulfonamides status: Secondary | ICD-10-CM | POA: Diagnosis not present

## 2019-06-01 DIAGNOSIS — Z881 Allergy status to other antibiotic agents status: Secondary | ICD-10-CM | POA: Insufficient documentation

## 2019-06-01 DIAGNOSIS — E785 Hyperlipidemia, unspecified: Secondary | ICD-10-CM | POA: Insufficient documentation

## 2019-06-01 DIAGNOSIS — F17210 Nicotine dependence, cigarettes, uncomplicated: Secondary | ICD-10-CM | POA: Insufficient documentation

## 2019-06-01 DIAGNOSIS — I10 Essential (primary) hypertension: Secondary | ICD-10-CM | POA: Diagnosis not present

## 2019-06-01 DIAGNOSIS — Z888 Allergy status to other drugs, medicaments and biological substances status: Secondary | ICD-10-CM | POA: Diagnosis not present

## 2019-06-01 DIAGNOSIS — I2510 Atherosclerotic heart disease of native coronary artery without angina pectoris: Secondary | ICD-10-CM | POA: Diagnosis not present

## 2019-06-01 DIAGNOSIS — G5603 Carpal tunnel syndrome, bilateral upper limbs: Secondary | ICD-10-CM | POA: Insufficient documentation

## 2019-06-01 DIAGNOSIS — G4733 Obstructive sleep apnea (adult) (pediatric): Secondary | ICD-10-CM | POA: Diagnosis not present

## 2019-06-01 DIAGNOSIS — J449 Chronic obstructive pulmonary disease, unspecified: Secondary | ICD-10-CM | POA: Diagnosis not present

## 2019-06-01 DIAGNOSIS — Z96652 Presence of left artificial knee joint: Secondary | ICD-10-CM | POA: Diagnosis not present

## 2019-06-01 DIAGNOSIS — M1990 Unspecified osteoarthritis, unspecified site: Secondary | ICD-10-CM | POA: Insufficient documentation

## 2019-06-01 DIAGNOSIS — I252 Old myocardial infarction: Secondary | ICD-10-CM | POA: Diagnosis not present

## 2019-06-01 DIAGNOSIS — G5602 Carpal tunnel syndrome, left upper limb: Secondary | ICD-10-CM | POA: Diagnosis present

## 2019-06-01 HISTORY — PX: CARPAL TUNNEL RELEASE: SHX101

## 2019-06-01 LAB — GLUCOSE, CAPILLARY: Glucose-Capillary: 102 mg/dL — ABNORMAL HIGH (ref 70–99)

## 2019-06-01 SURGERY — CARPAL TUNNEL RELEASE
Anesthesia: Monitor Anesthesia Care | Site: Wrist | Laterality: Left

## 2019-06-01 MED ORDER — LIDOCAINE HCL (PF) 0.5 % IJ SOLN
INTRAMUSCULAR | Status: DC | PRN
  Administered 2019-06-01: 30 mL via INTRAVENOUS

## 2019-06-01 MED ORDER — ACETAMINOPHEN 500 MG PO TABS
1000.0000 mg | ORAL_TABLET | Freq: Once | ORAL | Status: AC
  Administered 2019-06-01: 1000 mg via ORAL

## 2019-06-01 MED ORDER — PROPOFOL 500 MG/50ML IV EMUL
INTRAVENOUS | Status: DC | PRN
  Administered 2019-06-01: 45 ug/kg/min via INTRAVENOUS

## 2019-06-01 MED ORDER — CHLORHEXIDINE GLUCONATE 4 % EX LIQD: 60.0000 mL | Freq: Once | CUTANEOUS | Status: DC

## 2019-06-01 MED ORDER — TRAMADOL HCL 50 MG PO TABS: 50.0000 mg | ORAL_TABLET | Freq: Four times a day (QID) | ORAL | 0 refills | Status: DC | PRN

## 2019-06-01 MED ORDER — BUPIVACAINE HCL (PF) 0.25 % IJ SOLN
INTRAMUSCULAR | Status: DC | PRN
  Administered 2019-06-01: 10 mL

## 2019-06-01 MED ORDER — CEFAZOLIN SODIUM-DEXTROSE 2-4 GM/100ML-% IV SOLN
INTRAVENOUS | Status: AC
  Filled 2019-06-01: qty 100

## 2019-06-01 MED ORDER — MIDAZOLAM HCL 2 MG/2ML IJ SOLN: 1.0000 mg | INTRAMUSCULAR | Status: DC | PRN

## 2019-06-01 MED ORDER — FENTANYL CITRATE (PF) 100 MCG/2ML IJ SOLN: 25.0000 ug | INTRAMUSCULAR | Status: DC | PRN

## 2019-06-01 MED ORDER — ONDANSETRON HCL 4 MG/2ML IJ SOLN
INTRAMUSCULAR | Status: DC | PRN
  Administered 2019-06-01: 4 mg via INTRAVENOUS

## 2019-06-01 MED ORDER — LACTATED RINGERS IV SOLN: INTRAVENOUS | Status: DC

## 2019-06-01 MED ORDER — CEFAZOLIN SODIUM-DEXTROSE 2-4 GM/100ML-% IV SOLN
2.0000 g | INTRAVENOUS | Status: AC
  Administered 2019-06-01: 2 g via INTRAVENOUS

## 2019-06-01 MED ORDER — FENTANYL CITRATE (PF) 100 MCG/2ML IJ SOLN
INTRAMUSCULAR | Status: AC
  Filled 2019-06-01: qty 2

## 2019-06-01 MED ORDER — FENTANYL CITRATE (PF) 100 MCG/2ML IJ SOLN
50.0000 ug | INTRAMUSCULAR | Status: DC | PRN
  Administered 2019-06-01 (×2): 50 ug via INTRAVENOUS

## 2019-06-01 MED ORDER — ONDANSETRON HCL 4 MG/2ML IJ SOLN: 4.0000 mg | Freq: Once | INTRAMUSCULAR | Status: DC | PRN

## 2019-06-01 MED ORDER — ACETAMINOPHEN 500 MG PO TABS
ORAL_TABLET | ORAL | Status: AC
  Filled 2019-06-01: qty 2

## 2019-06-01 MED ORDER — PHENYLEPHRINE HCL (PRESSORS) 10 MG/ML IV SOLN
INTRAVENOUS | Status: DC | PRN
  Administered 2019-06-01: 40 ug via INTRAVENOUS

## 2019-06-01 SURGICAL SUPPLY — 41 items
APL PRP STRL LF DISP 70% ISPRP (MISCELLANEOUS) ×1
BLADE SURG 15 STRL LF DISP TIS (BLADE) ×1 IMPLANT
BLADE SURG 15 STRL SS (BLADE) ×2
BNDG CMPR 9X4 STRL LF SNTH (GAUZE/BANDAGES/DRESSINGS) ×1
BNDG COHESIVE 3X5 TAN STRL LF (GAUZE/BANDAGES/DRESSINGS) ×2 IMPLANT
BNDG ESMARK 4X9 LF (GAUZE/BANDAGES/DRESSINGS) ×1 IMPLANT
BNDG GAUZE ELAST 4 BULKY (GAUZE/BANDAGES/DRESSINGS) ×2 IMPLANT
CHLORAPREP W/TINT 26 (MISCELLANEOUS) ×2 IMPLANT
CORD BIPOLAR FORCEPS 12FT (ELECTRODE) ×2 IMPLANT
COVER BACK TABLE 60X90IN (DRAPES) ×2 IMPLANT
COVER MAYO STAND STRL (DRAPES) ×2 IMPLANT
COVER WAND RF STERILE (DRAPES) IMPLANT
CUFF TOURN SGL QUICK 18X4 (TOURNIQUET CUFF) ×2 IMPLANT
DRAPE EXTREMITY T 121X128X90 (DISPOSABLE) ×2 IMPLANT
DRAPE SURG 17X23 STRL (DRAPES) ×2 IMPLANT
DRSG PAD ABDOMINAL 8X10 ST (GAUZE/BANDAGES/DRESSINGS) ×2 IMPLANT
GAUZE SPONGE 4X4 12PLY STRL (GAUZE/BANDAGES/DRESSINGS) ×2 IMPLANT
GAUZE XEROFORM 1X8 LF (GAUZE/BANDAGES/DRESSINGS) ×2 IMPLANT
GLOVE BIOGEL PI IND STRL 6.5 (GLOVE) IMPLANT
GLOVE BIOGEL PI IND STRL 7.0 (GLOVE) IMPLANT
GLOVE BIOGEL PI IND STRL 8.5 (GLOVE) ×1 IMPLANT
GLOVE BIOGEL PI INDICATOR 6.5 (GLOVE) ×1
GLOVE BIOGEL PI INDICATOR 7.0 (GLOVE) ×2
GLOVE BIOGEL PI INDICATOR 8.5 (GLOVE) ×1
GLOVE ECLIPSE 6.5 STRL STRAW (GLOVE) ×1 IMPLANT
GLOVE SURG ORTHO 8.0 STRL STRW (GLOVE) ×2 IMPLANT
GLOVE SURG SS PI 6.5 STRL IVOR (GLOVE) ×1 IMPLANT
GOWN STRL REUS W/ TWL LRG LVL3 (GOWN DISPOSABLE) ×1 IMPLANT
GOWN STRL REUS W/TWL LRG LVL3 (GOWN DISPOSABLE) ×4
GOWN STRL REUS W/TWL XL LVL3 (GOWN DISPOSABLE) ×2 IMPLANT
NDL PRECISIONGLIDE 27X1.5 (NEEDLE) IMPLANT
NEEDLE PRECISIONGLIDE 27X1.5 (NEEDLE) IMPLANT
NS IRRIG 1000ML POUR BTL (IV SOLUTION) ×2 IMPLANT
PACK BASIN DAY SURGERY FS (CUSTOM PROCEDURE TRAY) ×2 IMPLANT
STOCKINETTE 4X48 STRL (DRAPES) ×2 IMPLANT
SUT ETHILON 4 0 PS 2 18 (SUTURE) ×2 IMPLANT
SUT VICRYL 4-0 PS2 18IN ABS (SUTURE) IMPLANT
SYR BULB 3OZ (MISCELLANEOUS) ×2 IMPLANT
SYR CONTROL 10ML LL (SYRINGE) ×1 IMPLANT
TOWEL GREEN STERILE FF (TOWEL DISPOSABLE) ×2 IMPLANT
UNDERPAD 30X36 HEAVY ABSORB (UNDERPADS AND DIAPERS) ×1 IMPLANT

## 2019-06-01 NOTE — Brief Op Note (Signed)
06/01/2019  11:13 AM  PATIENT:  [name] Abu Sr.  79 y.o. male  PRE-OPERATIVE DIAGNOSIS:  LEFT CARPAL TUNNEL SYNDROME  POST-OPERATIVE DIAGNOSIS:  LEFT CARPAL TUNNEL SYNDROME  PROCEDURE:  Procedure(s) with comments: LEFT CARPAL TUNNEL RELEASE (Left) - FOREARM BLOCK  SURGEON:  Surgeon(s) and Role:    * Daryll Brod, MD - Primary  PHYSICIAN ASSISTANT:   ASSISTANTS: none   ANESTHESIA:   local, regional and IV sedation  EBL: 1 mL  BLOOD ADMINISTERED:none  DRAINS: none   LOCAL MEDICATIONS USED:  BUPIVICAINE   SPECIMEN:  No Specimen  DISPOSITION OF SPECIMEN:  N/A  COUNTS:  YES  TOURNIQUET:   Total Tourniquet Time Documented: Forearm (Left) - 22 minutes Total: Forearm (Left) - 22 minutes   DICTATION: .Dragon Dictation  PLAN OF CARE: Discharge to home after PACU  PATIENT DISPOSITION:  PACU - hemodynamically stable.

## 2019-06-01 NOTE — Transfer of Care (Signed)
Immediate Anesthesia Transfer of Care Note  Patient: [name] Abu Sr.  Procedure(s) Performed: LEFT CARPAL TUNNEL RELEASE (Left Wrist)  Patient Location: PACU  Anesthesia Type:Bier block  Level of Consciousness: awake, alert , oriented and patient cooperative  Airway & Oxygen Therapy: Patient Spontanous Breathing and Patient connected to face mask oxygen  Post-op Assessment: Report given to RN and Post -op Vital signs reviewed and stable  Post vital signs: Reviewed and stable  Last Vitals:  Vitals Value Taken Time  BP 110/62 06/01/19 1111  Temp    Pulse    Resp 12 06/01/19 1112  SpO2    Vitals shown include unvalidated device data.  Last Pain:  Vitals:   06/01/19 0946  TempSrc: Temporal  PainSc: 0-No pain         Complications: No apparent anesthesia complications

## 2019-06-01 NOTE — Anesthesia Postprocedure Evaluation (Signed)
Anesthesia Post Note  Patient: [name] LEATHER Sr.  Procedure(s) Performed: LEFT CARPAL TUNNEL RELEASE (Left Wrist)     Patient location during evaluation: PACU Anesthesia Type: MAC and Bier Block Level of consciousness: awake and alert Pain management: pain level controlled Vital Signs Assessment: post-procedure vital signs reviewed and stable Respiratory status: spontaneous breathing, nonlabored ventilation and respiratory function stable Cardiovascular status: blood pressure returned to baseline and stable Postop Assessment: no apparent nausea or vomiting Anesthetic complications: no    Last Vitals:  Vitals:   06/01/19 1111 06/01/19 1115  BP: 110/62 118/63  Pulse:  (!) 55  Resp: 16 15  Temp: 36.6 C   SpO2: 99% 99%    Last Pain:  Vitals:   06/01/19 1115  TempSrc:   PainSc: 0-No pain                 Pervis Hocking
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
## 2019-06-01 NOTE — Anesthesia Procedure Notes (Signed)
Anesthesia Regional Block: Bier block (IV Regional)   Pre-Anesthetic Checklist: ,, timeout performed, Correct Patient, Correct Site, Correct Laterality, Correct Procedure,, site marked, surgical consent,, at surgeon's request  Laterality: Left     Needles:  Injection technique: Single-shot  Needle Type: Other      Needle Gauge: 20     Additional Needles:   Procedures:,,,,, intact distal pulses, Esmarch exsanguination, single tourniquet utilized,  Narrative:   Performed by: Personally       

## 2019-06-01 NOTE — Anesthesia Preprocedure Evaluation (Addendum)
Anesthesia Evaluation  Patient identified by MRN, date of birth, ID band Patient awake    Reviewed: Allergy & Precautions, NPO status , Patient's Chart, lab work & pertinent test results, reviewed documented beta blocker date and time , Unable to perform ROS - Chart review only  Airway Mallampati: I  TM Distance: >3 FB Neck ROM: Full    Dental  (+) Teeth Intact   Pulmonary shortness of breath and with exertion, sleep apnea , COPD, Current Smoker and Patient abstained from smoking.,  OSA- has had both CPAP and BiPAP in past, noncompliant with both  Still smoking 4-5cigg/d, 15 pack year history   breath sounds clear to auscultation       Cardiovascular hypertension, Pt. on medications and Pt. on home beta blockers + angina with exertion + CAD, + Past MI and + Peripheral Vascular Disease  (-) CHF + dysrhythmias  Rhythm:Regular Rate:Normal  MI 1991, 1993  Stress test 2015:  Moderate risk study due to fixed defect and ischemic dilatation. Needscath with coronary angio and possible PCI  Cath 2020: Known chronic total occlusion of the proximal RCA with extensive collateralization to a large distal RCA with the left circumflex coronary artery supplying the posterior lateral vessels and LAD/septal perforating arteries supplying the PDA.  There is an 80% stenosis in the continuation branch between the PDA and PLA takeoff. Mild nonobstructive disease involving the LAD with 30% stenosis after the second diagonal vessel and normal left circumflex coronary artery. Low normal LV function with EF estimated approximately 50%.  There is a focal area of inferobasilar akinesis.  The EDP is 18 mm Hg.  Reccs from cath: Medical therapy.  The patient has been on nitrates and beta-blocker therapy and was recently started on Ranexa 500 mg twice a day.  If he continues to increase symptoms would further titrate as an outpatient to 1000 mg twice a day.   Continue aggressive lipid-lowering therapy.  The patient is on rosuvastatin.  Target LDL is less than 70.   Neuro/Psych PSYCHIATRIC DISORDERS Depression negative neurological ROS     GI/Hepatic Neg liver ROS, PUD, GERD  Controlled and Medicated,  Endo/Other  diabetes, Well Controlled, Type 2  Renal/GU negative Renal ROS  negative genitourinary   Musculoskeletal  (+) Arthritis , Osteoarthritis,  DDD, chronic LBP   Abdominal (+) + obese,   Peds  Hematology negative hematology ROS (+)   Anesthesia Other Findings HLD  Reproductive/Obstetrics negative OB ROS                           Anesthesia Physical Anesthesia Plan  ASA: III  Anesthesia Plan: Bier Block and MAC and Bier Block-LIDOCAINE ONLY   Post-op Pain Management:    Induction:   PONV Risk Score and Plan: Propofol infusion and TIVA  Airway Management Planned: Natural Airway and Nasal Cannula  Additional Equipment: None  Intra-op Plan:   Post-operative Plan: Extubation in OR  Informed Consent: I have reviewed the patients History and Physical, chart, labs and discussed the procedure including the risks, benefits and alternatives for the proposed anesthesia with the patient or authorized representative who has indicated his/her understanding and acceptance.     Dental advisory given  Plan Discussed with: CRNA  Anesthesia Plan Comments:         Anesthesia Quick Evaluation

## 2019-06-01 NOTE — Discharge Instructions (Addendum)

## 2019-06-01 NOTE — Op Note (Signed)
NAME: [name] CHRISTENBURY Sr. MEDICAL RECORD NO: [id number] DATE OF BIRTH: 04/14/1940 FACILITY: [facility] LOCATION: [facility] PHYSICIAN: Wynonia Sours, MD   OPERATIVE REPORT   DATE OF PROCEDURE: 06/01/19    PREOPERATIVE DIAGNOSIS:   Carpal tunnel syndrome left hand   POSTOPERATIVE DIAGNOSIS:   Same   PROCEDURE:   Decompression median nerve left hand   SURGEON: Daryll Brod, M.D.   ASSISTANT: none   ANESTHESIA:  Bier block with sedation and Local   INTRAVENOUS FLUIDS:  Per anesthesia flow sheet.   ESTIMATED BLOOD LOSS:  Minimal.   COMPLICATIONS:  None.   SPECIMENS:  none   TOURNIQUET TIME:    Total Tourniquet Time Documented: Forearm (Left) - 22 minutes Total: Forearm (Left) - 22 minutes    DISPOSITION:  Stable to PACU.   INDICATIONS: Patient is a 79 year old male with a history of numbness and tingling of his hands bilateral.  He has undergone nerve conductions revealing severe carpal tunnel syndrome bilaterally.  He is like to undergo surgical decompression of the median nerve left hand.  Preperi-and postoperative course been discussed along with risks and complications.  He is aware that there is no guarantee to the surgery the possibility of infection recurrence injury to arteries nerves tendons complete relief of symptoms and dystrophy.  In the preoperative area the patient is seen extremity marked with both patient and surgeon antibiotic given  OPERATIVE COURSE: Patient is brought to the operating room where form based IV regional anesthetic was carried out without difficulty.  He was prepped using ChloraPrep and in supine position with left arm free.  3-minute dry time was allowed and timeout taken to confirm patient procedure.  A longitudinal incision was made in the left palm carried down through subcutaneous tissue.  Bleeders were electrocauterized with bipolar.  Palmar fascia was split.  Superficial palmar arch was identified.  Flexor tendon the ring little  finger was identified.  Retractors were placed retracting median nerve flexor tendons radially and ulnar nerve ulnarly.  The flexor retinaculum was then released on its ulnar border.  Right angle and stool retractor placed between skin and forearm fascia after dissecting the skin and forearm fascia free.  Deep structures were dissected free with blunt dissection.  The proximal aspect of the flexor retinaculum distal forearm fascia was then released for approximately 2 cm to 3 cm proximal to the wrist crease under direct vision.  The canal was explored.  An area compression to the nerve is apparent with a hourglass deformity and significant hyperemia being noted.  Motor branch had in the muscle distally.  The wound was copiously irrigated with saline.  The skin was closed and opted for nylon sutures.  Local infiltration quarter percent bupivacaine without epinephrine was given.  Approximately 10 cc was used.  A sterile compressive dressing with the fingers free was applied.  Deflation of the tourniquet all fingers immediately pink.  He was taken to the recovery room for observation in satisfactory condition.  He will be discharged home to return the hand center [facility] in 1 week on Tylenol with ibuprofen and Ultram for breakthrough.   Daryll Brod, MD Electronically signed, 06/01/19

## 2019-06-01 NOTE — Anesthesia Procedure Notes (Signed)
Procedure Name: MAC Date/Time: 06/01/2019 10:52 AM Performed by: Signe Colt, CRNA Pre-anesthesia Checklist: Patient identified, Emergency Drugs available, Suction available, Patient being monitored and Timeout performed Patient Re-evaluated:Patient Re-evaluated prior to induction Oxygen Delivery Method: Simple face mask

## 2019-06-02 ENCOUNTER — Encounter: Payer: Self-pay | Admitting: *Deleted

## 2019-09-27 ENCOUNTER — Other Ambulatory Visit: Payer: Self-pay | Admitting: Cardiology

## 2019-10-08 ENCOUNTER — Other Ambulatory Visit: Payer: Self-pay | Admitting: Nurse Practitioner

## 2019-10-08 DIAGNOSIS — I209 Angina pectoris, unspecified: Secondary | ICD-10-CM

## 2019-10-18 ENCOUNTER — Other Ambulatory Visit: Payer: Self-pay

## 2019-10-18 MED ORDER — METOPROLOL TARTRATE 25 MG PO TABS: 12.5000 mg | ORAL_TABLET | Freq: Two times a day (BID) | ORAL | 1 refills | Status: DC

## 2019-10-20 ENCOUNTER — Other Ambulatory Visit: Payer: Self-pay

## 2019-10-20 MED ORDER — RANOLAZINE ER 1000 MG PO TB12: 1000.0000 mg | ORAL_TABLET | Freq: Two times a day (BID) | ORAL | 1 refills | Status: DC

## 2019-10-25 ENCOUNTER — Other Ambulatory Visit (HOSPITAL_COMMUNITY): Payer: Self-pay | Admitting: Nurse Practitioner

## 2019-10-25 ENCOUNTER — Ambulatory Visit (HOSPITAL_COMMUNITY)
Admission: RE | Admit: 2019-10-25 | Discharge: 2019-10-25 | Disposition: A | Discharge status: Home / Self Care | Payer: Medicare Other | Source: Ambulatory Visit | Attending: Pulmonary Disease | Admitting: Pulmonary Disease

## 2019-10-25 DIAGNOSIS — U071 COVID-19: Secondary | ICD-10-CM

## 2019-10-25 DIAGNOSIS — Z23 Encounter for immunization: Secondary | ICD-10-CM | POA: Insufficient documentation

## 2019-10-25 MED ORDER — EPINEPHRINE 0.3 MG/0.3ML IJ SOAJ: 0.3000 mg | Freq: Once | INTRAMUSCULAR | Status: DC | PRN

## 2019-10-25 MED ORDER — METHYLPREDNISOLONE SODIUM SUCC 125 MG IJ SOLR: 125.0000 mg | Freq: Once | INTRAMUSCULAR | Status: DC | PRN

## 2019-10-25 MED ORDER — ALBUTEROL SULFATE HFA 108 (90 BASE) MCG/ACT IN AERS: 2.0000 | INHALATION_SPRAY | Freq: Once | RESPIRATORY_TRACT | Status: DC | PRN

## 2019-10-25 MED ORDER — FAMOTIDINE IN NACL 20-0.9 MG/50ML-% IV SOLN: 20.0000 mg | Freq: Once | INTRAVENOUS | Status: DC | PRN

## 2019-10-25 MED ORDER — DIPHENHYDRAMINE HCL 50 MG/ML IJ SOLN: 50.0000 mg | Freq: Once | INTRAMUSCULAR | Status: DC | PRN

## 2019-10-25 MED ORDER — SODIUM CHLORIDE 0.9 % IV SOLN: INTRAVENOUS | Status: DC | PRN

## 2019-10-25 MED ORDER — SODIUM CHLORIDE 0.9 % IV SOLN
1200.0000 mg | Freq: Once | INTRAVENOUS | Status: AC
  Administered 2019-10-25: 1200 mg via INTRAVENOUS

## 2019-10-25 NOTE — Discharge Instructions (Signed)

## 2019-10-25 NOTE — Progress Notes (Signed)
I connected by phone with [name] Abu Sr. on 10/25/2019 at 1:46 PM to discuss the potential use of an new treatment for mild to moderate COVID-19 viral infection in non-hospitalized patients.  This patient is a 79 y.o. male that meets the FDA criteria for Emergency Use Authorization of casirivimab\imdevimab.  Has a (+) direct SARS-CoV-2 viral test result  Has mild or moderate COVID-19   Is ? 80 years of age and weighs ? 40 kg  Is NOT hospitalized due to COVID-19  Is NOT requiring oxygen therapy or requiring an increase in baseline oxygen flow rate due to COVID-19  Is within 10 days of symptom onset  Has at least one of the high risk factor(s) for progression to severe COVID-19 and/or hospitalization as defined in EUA.  Specific high risk criteria : Older age (>/= 80 yo)   I have spoken and communicated the following to the patient or parent/caregiver:  1. FDA has authorized the emergency use of casirivimab\imdevimab for the treatment of mild to moderate COVID-19 in adults and pediatric patients with positive results of direct SARS-CoV-2 viral testing who are 52 years of age and older weighing at least 40 kg, and who are at high risk for progressing to severe COVID-19 and/or hospitalization.  2. The significant known and potential risks and benefits of casirivimab\imdevimab, and the extent to which such potential risks and benefits are unknown.  3. Information on available alternative treatments and the risks and benefits of those alternatives, including clinical trials.  4. Patients treated with casirivimab\imdevimab should continue to self-isolate and use infection control measures (e.g., wear mask, isolate, social distance, avoid sharing personal items, clean and disinfect "high touch" surfaces, and frequent handwashing) according to CDC guidelines.   5. The patient or parent/caregiver has the option to accept or refuse casirivimab\imdevimab .  After reviewing this information with  the patient, The patient agreed to proceed with receiving casirivimab\imdevimab infusion and will be provided a copy of the Fact sheet prior to receiving the infusion.Beckey Rutter, [place], AGNP-C [phone] ([place])

## 2019-10-25 NOTE — Progress Notes (Signed)
  Diagnosis: COVID-19  Physician: Dr. Joya Gaskins  Procedure: [facility] Med: casirivimab\imdevimab infusion - Provided patient with casirivimab\imdevimab fact sheet for patients, parents and caregivers prior to infusion.  Complications: No immediate complications noted.  Discharge: Discharged home   Tia Masker 10/25/2019

## 2019-11-16 ENCOUNTER — Emergency Department (HOSPITAL_COMMUNITY)
Admission: EM | Admit: 2019-11-16 | Discharge: 2019-11-16 | Disposition: A | Discharge status: Home / Self Care | Payer: Medicare Other | Source: Home / Self Care | Attending: Emergency Medicine | Admitting: Emergency Medicine

## 2019-11-16 ENCOUNTER — Emergency Department (HOSPITAL_COMMUNITY): Payer: Medicare Other

## 2019-11-16 ENCOUNTER — Encounter (HOSPITAL_COMMUNITY): Payer: Self-pay | Admitting: Emergency Medicine

## 2019-11-16 ENCOUNTER — Other Ambulatory Visit: Payer: Self-pay

## 2019-11-16 DIAGNOSIS — Z720 Tobacco use: Secondary | ICD-10-CM

## 2019-11-16 DIAGNOSIS — E119 Type 2 diabetes mellitus without complications: Secondary | ICD-10-CM | POA: Insufficient documentation

## 2019-11-16 DIAGNOSIS — F17210 Nicotine dependence, cigarettes, uncomplicated: Secondary | ICD-10-CM | POA: Insufficient documentation

## 2019-11-16 DIAGNOSIS — I2510 Atherosclerotic heart disease of native coronary artery without angina pectoris: Secondary | ICD-10-CM | POA: Insufficient documentation

## 2019-11-16 DIAGNOSIS — U071 COVID-19: Secondary | ICD-10-CM | POA: Insufficient documentation

## 2019-11-16 DIAGNOSIS — Z96652 Presence of left artificial knee joint: Secondary | ICD-10-CM | POA: Insufficient documentation

## 2019-11-16 DIAGNOSIS — J441 Chronic obstructive pulmonary disease with (acute) exacerbation: Secondary | ICD-10-CM

## 2019-11-16 DIAGNOSIS — Z7982 Long term (current) use of aspirin: Secondary | ICD-10-CM | POA: Insufficient documentation

## 2019-11-16 DIAGNOSIS — K209 Esophagitis, unspecified without bleeding: Secondary | ICD-10-CM | POA: Insufficient documentation

## 2019-11-16 DIAGNOSIS — Z79899 Other long term (current) drug therapy: Secondary | ICD-10-CM | POA: Insufficient documentation

## 2019-11-16 DIAGNOSIS — R0789 Other chest pain: Secondary | ICD-10-CM

## 2019-11-16 LAB — CBC
HCT: 39.8 % (ref 39.0–52.0)
Hemoglobin: 12.9 g/dL — ABNORMAL LOW (ref 13.0–17.0)
MCH: 33.2 pg (ref 26.0–34.0)
MCHC: 32.4 g/dL (ref 30.0–36.0)
MCV: 102.6 fL — ABNORMAL HIGH (ref 80.0–100.0)
Platelets: 200 10*3/uL (ref 150–400)
RBC: 3.88 MIL/uL — ABNORMAL LOW (ref 4.22–5.81)
RDW: 13.2 % (ref 11.5–15.5)
WBC: 6.3 10*3/uL (ref 4.0–10.5)
nRBC: 0 % (ref 0.0–0.2)

## 2019-11-16 LAB — BASIC METABOLIC PANEL
Anion gap: 6 (ref 5–15)
BUN: 14 mg/dL (ref 8–23)
CO2: 26 mmol/L (ref 22–32)
Calcium: 9.2 mg/dL (ref 8.9–10.3)
Chloride: 104 mmol/L (ref 98–111)
Creatinine, Ser: 0.75 mg/dL (ref 0.61–1.24)
GFR calc Af Amer: >60 mL/min (ref >60)
GFR calc non Af Amer: >60 mL/min (ref >60)
Glucose, Bld: 112 mg/dL — ABNORMAL HIGH (ref 70–99)
Potassium: 4.4 mmol/L (ref 3.5–5.1)
Sodium: 136 mmol/L (ref 135–145)

## 2019-11-16 LAB — TROPONIN I (HIGH SENSITIVITY)
Troponin I (High Sensitivity): 6 ng/L (ref <18)
Troponin I (High Sensitivity): 6 ng/L (ref <18)

## 2019-11-16 LAB — SARS CORONAVIRUS 2 BY RT PCR (HOSPITAL ORDER, PERFORMED IN ~~LOC~~ HOSPITAL LAB): SARS Coronavirus 2: POSITIVE — AB

## 2019-11-16 MED ORDER — IOHEXOL 350 MG/ML SOLN
75.0000 mL | Freq: Once | INTRAVENOUS | Status: AC | PRN
  Administered 2019-11-16: 75 mL via INTRAVENOUS

## 2019-11-16 MED ORDER — ALBUTEROL SULFATE HFA 108 (90 BASE) MCG/ACT IN AERS
4.0000 | INHALATION_SPRAY | Freq: Once | RESPIRATORY_TRACT | Status: AC
  Administered 2019-11-16: 4 via RESPIRATORY_TRACT
  Filled 2019-11-16: qty 6.7

## 2019-11-16 MED ORDER — DEXAMETHASONE SODIUM PHOSPHATE 10 MG/ML IJ SOLN
10.0000 mg | Freq: Once | INTRAMUSCULAR | Status: AC
  Administered 2019-11-16: 10 mg via INTRAVENOUS
  Filled 2019-11-16: qty 1

## 2019-11-16 NOTE — Discharge Instructions (Signed)
Try to quit smoking.  Continue taking your Protonix.  Return if worse.

## 2019-11-16 NOTE — ED Triage Notes (Signed)
Onset last night developed shortness of breath and chest pain continued today. Pain currently 5/10 achy dull.
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
## 2019-11-19 ENCOUNTER — Emergency Department (HOSPITAL_COMMUNITY): Payer: Medicare Other

## 2019-11-19 ENCOUNTER — Inpatient Hospital Stay (HOSPITAL_COMMUNITY): Payer: Medicare Other | Admitting: Certified Registered"

## 2019-11-19 ENCOUNTER — Inpatient Hospital Stay (HOSPITAL_COMMUNITY)
Admission: EM | Admit: 2019-11-19 | Discharge: 2019-11-23 | DRG: 853 | Disposition: A | Discharge status: Home / Self Care | Payer: Medicare Other | Attending: Internal Medicine | Admitting: Internal Medicine

## 2019-11-19 ENCOUNTER — Encounter (HOSPITAL_COMMUNITY): Payer: Self-pay | Admitting: Emergency Medicine

## 2019-11-19 ENCOUNTER — Inpatient Hospital Stay (HOSPITAL_COMMUNITY): Payer: Medicare Other

## 2019-11-19 ENCOUNTER — Encounter (HOSPITAL_COMMUNITY)
Admission: EM | Disposition: A | Discharge status: Home / Self Care | Payer: Self-pay | Source: Home / Self Care | Attending: Internal Medicine

## 2019-11-19 DIAGNOSIS — K219 Gastro-esophageal reflux disease without esophagitis: Secondary | ICD-10-CM | POA: Diagnosis present

## 2019-11-19 DIAGNOSIS — E119 Type 2 diabetes mellitus without complications: Secondary | ICD-10-CM | POA: Diagnosis present

## 2019-11-19 DIAGNOSIS — A419 Sepsis, unspecified organism: Secondary | ICD-10-CM | POA: Diagnosis present

## 2019-11-19 DIAGNOSIS — J449 Chronic obstructive pulmonary disease, unspecified: Secondary | ICD-10-CM | POA: Diagnosis present

## 2019-11-19 DIAGNOSIS — F17210 Nicotine dependence, cigarettes, uncomplicated: Secondary | ICD-10-CM | POA: Diagnosis present

## 2019-11-19 DIAGNOSIS — R7881 Bacteremia: Secondary | ICD-10-CM

## 2019-11-19 DIAGNOSIS — R6520 Severe sepsis without septic shock: Secondary | ICD-10-CM | POA: Diagnosis present

## 2019-11-19 DIAGNOSIS — D649 Anemia, unspecified: Secondary | ICD-10-CM | POA: Diagnosis present

## 2019-11-19 DIAGNOSIS — N400 Enlarged prostate without lower urinary tract symptoms: Secondary | ICD-10-CM | POA: Diagnosis present

## 2019-11-19 DIAGNOSIS — E785 Hyperlipidemia, unspecified: Secondary | ICD-10-CM | POA: Diagnosis present

## 2019-11-19 DIAGNOSIS — A411 Sepsis due to other specified staphylococcus: Principal | ICD-10-CM | POA: Diagnosis present

## 2019-11-19 DIAGNOSIS — I2510 Atherosclerotic heart disease of native coronary artery without angina pectoris: Secondary | ICD-10-CM | POA: Diagnosis present

## 2019-11-19 DIAGNOSIS — R6521 Severe sepsis with septic shock: Secondary | ICD-10-CM

## 2019-11-19 DIAGNOSIS — Z7982 Long term (current) use of aspirin: Secondary | ICD-10-CM

## 2019-11-19 DIAGNOSIS — N139 Obstructive and reflux uropathy, unspecified: Secondary | ICD-10-CM

## 2019-11-19 DIAGNOSIS — I10 Essential (primary) hypertension: Secondary | ICD-10-CM | POA: Diagnosis not present

## 2019-11-19 DIAGNOSIS — Z96653 Presence of artificial knee joint, bilateral: Secondary | ICD-10-CM | POA: Diagnosis present

## 2019-11-19 DIAGNOSIS — N200 Calculus of kidney: Secondary | ICD-10-CM

## 2019-11-19 DIAGNOSIS — Z79899 Other long term (current) drug therapy: Secondary | ICD-10-CM

## 2019-11-19 DIAGNOSIS — Z9861 Coronary angioplasty status: Secondary | ICD-10-CM

## 2019-11-19 DIAGNOSIS — N132 Hydronephrosis with renal and ureteral calculous obstruction: Secondary | ICD-10-CM

## 2019-11-19 DIAGNOSIS — I5022 Chronic systolic (congestive) heart failure: Secondary | ICD-10-CM | POA: Diagnosis present

## 2019-11-19 DIAGNOSIS — Z419 Encounter for procedure for purposes other than remedying health state, unspecified: Secondary | ICD-10-CM

## 2019-11-19 DIAGNOSIS — N390 Urinary tract infection, site not specified: Secondary | ICD-10-CM | POA: Diagnosis present

## 2019-11-19 DIAGNOSIS — I252 Old myocardial infarction: Secondary | ICD-10-CM | POA: Diagnosis not present

## 2019-11-19 DIAGNOSIS — N179 Acute kidney failure, unspecified: Secondary | ICD-10-CM | POA: Diagnosis present

## 2019-11-19 DIAGNOSIS — U071 COVID-19: Secondary | ICD-10-CM | POA: Diagnosis present

## 2019-11-19 DIAGNOSIS — G9341 Metabolic encephalopathy: Secondary | ICD-10-CM | POA: Diagnosis present

## 2019-11-19 DIAGNOSIS — I110 Hypertensive heart disease with heart failure: Secondary | ICD-10-CM | POA: Diagnosis present

## 2019-11-19 DIAGNOSIS — N136 Pyonephrosis: Secondary | ICD-10-CM | POA: Diagnosis present

## 2019-11-19 DIAGNOSIS — G9340 Encephalopathy, unspecified: Secondary | ICD-10-CM

## 2019-11-19 DIAGNOSIS — B957 Other staphylococcus as the cause of diseases classified elsewhere: Secondary | ICD-10-CM | POA: Diagnosis not present

## 2019-11-19 DIAGNOSIS — K220 Achalasia of cardia: Secondary | ICD-10-CM | POA: Diagnosis present

## 2019-11-19 HISTORY — PX: CYSTOSCOPY WITH STENT PLACEMENT: SHX5790

## 2019-11-19 LAB — CBC
HCT: 39.7 % (ref 39.0–52.0)
Hemoglobin: 12.8 g/dL — ABNORMAL LOW (ref 13.0–17.0)
MCH: 32.8 pg (ref 26.0–34.0)
MCHC: 32.2 g/dL (ref 30.0–36.0)
MCV: 101.8 fL — ABNORMAL HIGH (ref 80.0–100.0)
Platelets: 150 10*3/uL (ref 150–400)
RBC: 3.9 MIL/uL — ABNORMAL LOW (ref 4.22–5.81)
RDW: 13.6 % (ref 11.5–15.5)
WBC: 13.2 10*3/uL — ABNORMAL HIGH (ref 4.0–10.5)
nRBC: 0 % (ref 0.0–0.2)

## 2019-11-19 LAB — CBC WITH DIFFERENTIAL/PLATELET
Abs Immature Granulocytes: 0 K/uL (ref 0.00–0.07)
Basophils Absolute: 0 K/uL (ref 0.0–0.1)
Basophils Relative: 0 %
Eosinophils Absolute: 0 K/uL (ref 0.0–0.5)
Eosinophils Relative: 0 %
HCT: 39.7 % (ref 39.0–52.0)
Hemoglobin: 12.9 g/dL — ABNORMAL LOW (ref 13.0–17.0)
Lymphocytes Relative: 4 %
Lymphs Abs: 0.5 K/uL — ABNORMAL LOW (ref 0.7–4.0)
MCH: 33.2 pg (ref 26.0–34.0)
MCHC: 32.5 g/dL (ref 30.0–36.0)
MCV: 102.1 fL — ABNORMAL HIGH (ref 80.0–100.0)
Monocytes Absolute: 0.5 K/uL (ref 0.1–1.0)
Monocytes Relative: 4 %
Neutro Abs: 12.2 K/uL — ABNORMAL HIGH (ref 1.7–7.7)
Neutrophils Relative %: 92 %
Platelets: 163 K/uL (ref 150–400)
RBC: 3.89 MIL/uL — ABNORMAL LOW (ref 4.22–5.81)
RDW: 13.7 % (ref 11.5–15.5)
WBC: 13.3 K/uL — ABNORMAL HIGH (ref 4.0–10.5)
nRBC: 0 % (ref 0.0–0.2)
nRBC: 0 /100{WBCs}

## 2019-11-19 LAB — URINALYSIS, ROUTINE W REFLEX MICROSCOPIC
Bilirubin Urine: NEGATIVE
Glucose, UA: NEGATIVE mg/dL
Ketones, ur: NEGATIVE mg/dL
Nitrite: NEGATIVE
Protein, ur: 30 mg/dL — AB
Specific Gravity, Urine: 1.009 (ref 1.005–1.030)
WBC, UA: >50 WBC/hpf — ABNORMAL HIGH (ref 0–5)
pH: 5 (ref 5.0–8.0)

## 2019-11-19 LAB — PROTIME-INR
INR: 1.2 (ref 0.8–1.2)
Prothrombin Time: 14.9 seconds (ref 11.4–15.2)

## 2019-11-19 LAB — LACTIC ACID, PLASMA
Lactic Acid, Venous: 2.2 mmol/L (ref 0.5–1.9)
Lactic Acid, Venous: 1.8 mmol/L (ref 0.5–1.9)

## 2019-11-19 LAB — COMPREHENSIVE METABOLIC PANEL
ALT: 16 U/L (ref 0–44)
AST: 19 U/L (ref 15–41)
Albumin: 2.9 g/dL — ABNORMAL LOW (ref 3.5–5.0)
Alkaline Phosphatase: 62 U/L (ref 38–126)
Anion gap: 10 (ref 5–15)
BUN: 29 mg/dL — ABNORMAL HIGH (ref 8–23)
CO2: 26 mmol/L (ref 22–32)
Calcium: 9.1 mg/dL (ref 8.9–10.3)
Chloride: 96 mmol/L — ABNORMAL LOW (ref 98–111)
Creatinine, Ser: 1.61 mg/dL — ABNORMAL HIGH (ref 0.61–1.24)
GFR calc Af Amer: 46 mL/min — ABNORMAL LOW (ref >60)
GFR calc non Af Amer: 40 mL/min — ABNORMAL LOW (ref >60)
Glucose, Bld: 103 mg/dL — ABNORMAL HIGH (ref 70–99)
Potassium: 3.9 mmol/L (ref 3.5–5.1)
Sodium: 132 mmol/L — ABNORMAL LOW (ref 135–145)
Total Bilirubin: 1.1 mg/dL (ref 0.3–1.2)
Total Protein: 6.2 g/dL — ABNORMAL LOW (ref 6.5–8.1)

## 2019-11-19 LAB — APTT: aPTT: 30 seconds (ref 24–36)

## 2019-11-19 LAB — GLUCOSE, CAPILLARY: Glucose-Capillary: 109 mg/dL — ABNORMAL HIGH (ref 70–99)

## 2019-11-19 SURGERY — CYSTOSCOPY, WITH STENT INSERTION
Anesthesia: General | Site: Ureter | Laterality: Right

## 2019-11-19 MED ORDER — IOHEXOL 300 MG/ML  SOLN
INTRAMUSCULAR | Status: DC | PRN
  Administered 2019-11-19: 20 mL via URETHRAL

## 2019-11-19 MED ORDER — DEXAMETHASONE SODIUM PHOSPHATE 10 MG/ML IJ SOLN
INTRAMUSCULAR | Status: AC
  Filled 2019-11-19: qty 1

## 2019-11-19 MED ORDER — VANCOMYCIN HCL IN DEXTROSE 1-5 GM/200ML-% IV SOLN: 1000.0000 mg | INTRAVENOUS | Status: DC

## 2019-11-19 MED ORDER — NITROGLYCERIN 0.4 MG SL SUBL
0.4000 mg | SUBLINGUAL_TABLET | SUBLINGUAL | Status: AC | PRN
  Administered 2019-11-20 (×3): 0.4 mg via SUBLINGUAL
  Filled 2019-11-19: qty 1
  Filled 2019-11-19: qty 25

## 2019-11-19 MED ORDER — SUCCINYLCHOLINE 20MG/ML (10ML) SYRINGE FOR MEDFUSION PUMP - OPTIME
INTRAMUSCULAR | Status: DC | PRN
  Administered 2019-11-19: 120 mg via INTRAVENOUS

## 2019-11-19 MED ORDER — WATER FOR IRRIGATION, STERILE IR SOLN
Status: DC | PRN
  Administered 2019-11-19: 3000 mL via SURGICAL_CAVITY

## 2019-11-19 MED ORDER — ONDANSETRON HCL 4 MG/2ML IJ SOLN
INTRAMUSCULAR | Status: AC
  Filled 2019-11-19: qty 2

## 2019-11-19 MED ORDER — VITAMIN B-12 1000 MCG PO TABS
1000.0000 ug | ORAL_TABLET | Freq: Every day | ORAL | Status: DC
  Administered 2019-11-20 – 2019-11-23 (×4): 1000 ug via ORAL
  Filled 2019-11-19 (×4): qty 1

## 2019-11-19 MED ORDER — SODIUM CHLORIDE 0.9 % IV SOLN: INTRAVENOUS | Status: DC

## 2019-11-19 MED ORDER — RANOLAZINE ER 500 MG PO TB12
1000.0000 mg | ORAL_TABLET | Freq: Two times a day (BID) | ORAL | Status: DC
  Administered 2019-11-20 – 2019-11-23 (×7): 1000 mg via ORAL
  Filled 2019-11-19 (×9): qty 2

## 2019-11-19 MED ORDER — FENTANYL CITRATE (PF) 100 MCG/2ML IJ SOLN
25.0000 ug | INTRAMUSCULAR | Status: DC | PRN
  Administered 2019-11-19: 25 ug via INTRAVENOUS

## 2019-11-19 MED ORDER — LACTATED RINGERS IV SOLN: INTRAVENOUS | Status: AC

## 2019-11-19 MED ORDER — FINASTERIDE 5 MG PO TABS
5.0000 mg | ORAL_TABLET | Freq: Every day | ORAL | Status: DC
  Administered 2019-11-20 – 2019-11-23 (×4): 5 mg via ORAL
  Filled 2019-11-19 (×4): qty 1

## 2019-11-19 MED ORDER — PIPERACILLIN-TAZOBACTAM 3.375 G IVPB 30 MIN
3.3750 g | Freq: Once | INTRAVENOUS | Status: AC
  Administered 2019-11-19: 3.375 g via INTRAVENOUS
  Filled 2019-11-19: qty 50

## 2019-11-19 MED ORDER — ONDANSETRON HCL 4 MG PO TABS: 4.0000 mg | ORAL_TABLET | Freq: Three times a day (TID) | ORAL | Status: DC | PRN

## 2019-11-19 MED ORDER — FENTANYL CITRATE (PF) 250 MCG/5ML IJ SOLN
INTRAMUSCULAR | Status: AC
Start: 2019-11-19 — End: ?
  Filled 2019-11-19: qty 5

## 2019-11-19 MED ORDER — LIDOCAINE 2% (20 MG/ML) 5 ML SYRINGE
INTRAMUSCULAR | Status: AC
  Filled 2019-11-19: qty 5

## 2019-11-19 MED ORDER — SODIUM CHLORIDE 0.9 % IV SOLN
1.0000 g | INTRAVENOUS | Status: DC
  Administered 2019-11-20: 1 g via INTRAVENOUS
  Filled 2019-11-19: qty 10

## 2019-11-19 MED ORDER — ASPIRIN EC 81 MG PO TBEC
81.0000 mg | DELAYED_RELEASE_TABLET | Freq: Every day | ORAL | Status: DC
  Administered 2019-11-20 – 2019-11-23 (×4): 81 mg via ORAL
  Filled 2019-11-19 (×4): qty 1

## 2019-11-19 MED ORDER — STERILE WATER FOR IRRIGATION IR SOLN
Status: DC | PRN
  Administered 2019-11-19: 1000 mL

## 2019-11-19 MED ORDER — LACTATED RINGERS IV BOLUS (SEPSIS)
1000.0000 mL | Freq: Once | INTRAVENOUS | Status: AC
  Administered 2019-11-19: 1000 mL via INTRAVENOUS

## 2019-11-19 MED ORDER — ALBUTEROL SULFATE HFA 108 (90 BASE) MCG/ACT IN AERS
INHALATION_SPRAY | RESPIRATORY_TRACT | Status: DC | PRN
  Administered 2019-11-19: 3 via RESPIRATORY_TRACT

## 2019-11-19 MED ORDER — HEPARIN SODIUM (PORCINE) 5000 UNIT/ML IJ SOLN
5000.0000 [IU] | Freq: Two times a day (BID) | INTRAMUSCULAR | Status: DC
  Administered 2019-11-20 – 2019-11-23 (×7): 5000 [IU] via SUBCUTANEOUS
  Filled 2019-11-19 (×7): qty 1

## 2019-11-19 MED ORDER — DOCUSATE SODIUM 100 MG PO CAPS: 100.0000 mg | ORAL_CAPSULE | Freq: Two times a day (BID) | ORAL | Status: DC | PRN

## 2019-11-19 MED ORDER — LIDOCAINE VISCOUS HCL 2 % MT SOLN: 15.0000 mL | OROMUCOSAL | Status: DC | PRN

## 2019-11-19 MED ORDER — ONDANSETRON HCL 4 MG/2ML IJ SOLN
INTRAMUSCULAR | Status: DC | PRN
  Administered 2019-11-19: 4 mg via INTRAVENOUS

## 2019-11-19 MED ORDER — FENTANYL CITRATE (PF) 250 MCG/5ML IJ SOLN
INTRAMUSCULAR | Status: DC | PRN
  Administered 2019-11-19: 50 ug via INTRAVENOUS

## 2019-11-19 MED ORDER — VANCOMYCIN HCL IN DEXTROSE 1-5 GM/200ML-% IV SOLN: 1000.0000 mg | Freq: Two times a day (BID) | INTRAVENOUS | Status: DC

## 2019-11-19 MED ORDER — VANCOMYCIN HCL 1250 MG/250ML IV SOLN: 1250.0000 mg | INTRAVENOUS | Status: DC

## 2019-11-19 MED ORDER — FENTANYL CITRATE (PF) 100 MCG/2ML IJ SOLN
INTRAMUSCULAR | Status: AC
Start: 2019-11-19 — End: 2019-11-20
  Filled 2019-11-19: qty 2

## 2019-11-19 MED ORDER — ACETAMINOPHEN 650 MG RE SUPP: 650.0000 mg | Freq: Four times a day (QID) | RECTAL | Status: DC | PRN

## 2019-11-19 MED ORDER — VANCOMYCIN HCL 1500 MG/300ML IV SOLN
1500.0000 mg | Freq: Once | INTRAVENOUS | Status: DC
  Filled 2019-11-19: qty 300

## 2019-11-19 MED ORDER — PANTOPRAZOLE SODIUM 40 MG PO TBEC
40.0000 mg | DELAYED_RELEASE_TABLET | Freq: Every day | ORAL | Status: DC
  Administered 2019-11-20 – 2019-11-23 (×4): 40 mg via ORAL
  Filled 2019-11-19 (×4): qty 1

## 2019-11-19 MED ORDER — DEXAMETHASONE SODIUM PHOSPHATE 10 MG/ML IJ SOLN
INTRAMUSCULAR | Status: DC | PRN
  Administered 2019-11-19: 10 mg via INTRAVENOUS

## 2019-11-19 MED ORDER — PIPERACILLIN-TAZOBACTAM 3.375 G IVPB: 3.3750 g | Freq: Three times a day (TID) | INTRAVENOUS | Status: DC

## 2019-11-19 MED ORDER — POLYETHYLENE GLYCOL 3350 17 GM/SCOOP PO POWD
17.0000 g | Freq: Every day | ORAL | Status: DC | PRN
  Filled 2019-11-19: qty 255

## 2019-11-19 MED ORDER — ROSUVASTATIN CALCIUM 5 MG PO TABS
10.0000 mg | ORAL_TABLET | Freq: Every day | ORAL | Status: DC
  Administered 2019-11-20 – 2019-11-23 (×4): 10 mg via ORAL
  Filled 2019-11-19 (×4): qty 2

## 2019-11-19 MED ORDER — 0.9 % SODIUM CHLORIDE (POUR BTL) OPTIME
TOPICAL | Status: DC | PRN
  Administered 2019-11-19: 1000 mL

## 2019-11-19 MED ORDER — LIDOCAINE HCL URETHRAL/MUCOSAL 2 % EX GEL
CUTANEOUS | Status: AC
  Filled 2019-11-19: qty 11

## 2019-11-19 MED ORDER — LACTATED RINGERS IV SOLN: INTRAVENOUS | Status: DC | PRN

## 2019-11-19 MED ORDER — ENOXAPARIN SODIUM 40 MG/0.4ML ~~LOC~~ SOLN: 40.0000 mg | SUBCUTANEOUS | Status: DC

## 2019-11-19 MED ORDER — ADULT MULTIVITAMIN W/MINERALS CH
1.0000 | ORAL_TABLET | Freq: Every day | ORAL | Status: DC
  Administered 2019-11-20 – 2019-11-23 (×4): 1 via ORAL
  Filled 2019-11-19 (×4): qty 1

## 2019-11-19 MED ORDER — BUPROPION HCL 100 MG PO TABS
200.0000 mg | ORAL_TABLET | Freq: Every day | ORAL | Status: DC
  Administered 2019-11-20 – 2019-11-22 (×3): 200 mg via ORAL
  Filled 2019-11-19 (×4): qty 2

## 2019-11-19 MED ORDER — POLYETHYLENE GLYCOL 3350 17 G PO PACK: 17.0000 g | PACK | Freq: Every day | ORAL | Status: DC | PRN

## 2019-11-19 MED ORDER — SUCCINYLCHOLINE CHLORIDE 200 MG/10ML IV SOSY
PREFILLED_SYRINGE | INTRAVENOUS | Status: AC
  Filled 2019-11-19: qty 10

## 2019-11-19 MED ORDER — PROPOFOL 10 MG/ML IV BOLUS
INTRAVENOUS | Status: DC | PRN
  Administered 2019-11-19: 150 mg via INTRAVENOUS

## 2019-11-19 MED ORDER — ACETAMINOPHEN 325 MG PO TABS
650.0000 mg | ORAL_TABLET | Freq: Once | ORAL | Status: AC | PRN
  Administered 2019-11-19: 650 mg via ORAL
  Filled 2019-11-19: qty 2

## 2019-11-19 MED ORDER — VITAMIN D 25 MCG (1000 UNIT) PO TABS
2000.0000 [IU] | ORAL_TABLET | Freq: Every morning | ORAL | Status: DC
  Administered 2019-11-20 – 2019-11-23 (×4): 2000 [IU] via ORAL
  Filled 2019-11-19 (×4): qty 2

## 2019-11-19 MED ORDER — VANCOMYCIN HCL 2000 MG/400ML IV SOLN
2000.0000 mg | Freq: Once | INTRAVENOUS | Status: AC
  Administered 2019-11-19: 2000 mg via INTRAVENOUS
  Filled 2019-11-19: qty 400

## 2019-11-19 MED ORDER — LIDOCAINE HCL (CARDIAC) PF 100 MG/5ML IV SOSY
PREFILLED_SYRINGE | INTRAVENOUS | Status: DC | PRN
  Administered 2019-11-19: 80 mg via INTRATRACHEAL

## 2019-11-19 MED ORDER — ACETAMINOPHEN 325 MG PO TABS: 650.0000 mg | ORAL_TABLET | Freq: Four times a day (QID) | ORAL | Status: DC | PRN

## 2019-11-19 SURGICAL SUPPLY — 22 items
BAG DRN RND TRDRP ANRFLXCHMBR (UROLOGICAL SUPPLIES)
BAG URINE DRAIN 2000ML AR STRL (UROLOGICAL SUPPLIES) ×1 IMPLANT
BAG URO CATCHER STRL LF (MISCELLANEOUS) ×2 IMPLANT
CATH FOLEY 2WAY SLVR  5CC 16FR (CATHETERS)
CATH FOLEY 2WAY SLVR 5CC 16FR (CATHETERS) IMPLANT
CATH INTERMIT  6FR 70CM (CATHETERS) ×2 IMPLANT
GAUZE SPONGE 4X4 12PLY STRL (GAUZE/BANDAGES/DRESSINGS) ×1 IMPLANT
GLOVE BIO SURGEON STRL SZ7.5 (GLOVE) ×2 IMPLANT
GOWN STRL REUS W/ TWL LRG LVL3 (GOWN DISPOSABLE) ×1 IMPLANT
GOWN STRL REUS W/ TWL XL LVL3 (GOWN DISPOSABLE) ×1 IMPLANT
GOWN STRL REUS W/TWL LRG LVL3 (GOWN DISPOSABLE) ×2
GOWN STRL REUS W/TWL XL LVL3 (GOWN DISPOSABLE) ×2
GUIDEWIRE ANG ZIPWIRE 038X150 (WIRE) ×1 IMPLANT
GUIDEWIRE STR DUAL SENSOR (WIRE) ×1 IMPLANT
KIT TURNOVER KIT B (KITS) ×2 IMPLANT
MANIFOLD NEPTUNE II (INSTRUMENTS) ×2 IMPLANT
NS IRRIG 1000ML POUR BTL (IV SOLUTION) ×2 IMPLANT
PACK CYSTO (CUSTOM PROCEDURE TRAY) ×2 IMPLANT
STENT URET 6FRX26 CONTOUR (STENTS) ×1 IMPLANT
TOWEL GREEN STERILE FF (TOWEL DISPOSABLE) ×2 IMPLANT
TUBE CONNECTING 12X1/4 (SUCTIONS) ×2 IMPLANT
WATER STERILE IRR 3000ML UROMA (IV SOLUTION) ×2 IMPLANT

## 2019-11-19 NOTE — Discharge Instructions (Signed)

## 2019-11-19 NOTE — Consult Note (Signed)
H&P Physician requesting consult: Lennice Sites  Chief Complaint: Right ureteral stone  History of Present Illness: 79 year old male with multiple medical comorbidities presented with confusion.  He was noted to have a fever of 100.8, mild leukocytosis and a CT of the abdomen showed a 5 mm obstructing right ureteral stone.  Patient denies any pain.  Denies a previous history of stones.  Past Medical History:  Diagnosis Date   Adenomatous polyp 2007   Arthritis    "my body"   B12 deficiency    BPH (benign prostatic hyperplasia)    Chronic back pain    "all over"   Chronic low back pain    Coronary artery disease 2009   DDD (degenerative disc disease), cervical    DDD (degenerative disc disease), lumbosacral    DDD (degenerative disc disease), thoracic    Depression    Diabetes mellitus without complication (HCC)    Type II   Gastroesophageal reflux    Gynecomastia 06/2009   Hyperglycemia    Hyperlipidemia LDL goal < 70    MI (myocardial infarction) ([place]) 08/1989; 1993   OSA on CPAP    "have mask; don't wear it" (02/02/2014) 09/25/15- now has a BiPAp- doesn'tt use it   Peptic ulcer disease 1959   Right bundle branch block    Shortness of breath dyspnea    Tobacco abuse    Vertebral compression fracture ([place]) 1970   Past Surgical History:  Procedure Laterality Date   BYPASS GRAFT POPLITEAL TO POPLITEAL Left 06/2005   Dr Donnetta Hutching   BYPASS GRAFT POPLITEAL TO POPLITEAL Right 02/2001   Dr Deon Pilling   CARDIAC CATHETERIZATION  1991; 1993; 08/2012; 11/2013; 02/02/2014   I've had 6-7 total   CARDIAC CATHETERIZATION  02/02/2014   Procedure: LEFT HEART CATH AND CORONARY ANGIOGRAPHY;  Surgeon: Jettie Booze, MD;  mild disease in LAD/CFX, RCA w/ CTO, unsuccessfull PCI both w/ antegrade and retrograde approaches    CARPAL TUNNEL RELEASE Left 06/01/2019   Procedure: LEFT CARPAL TUNNEL RELEASE;  Surgeon: Daryll Brod, MD;  Location: [place];  Service:  Orthopedics;  Laterality: Left;  FOREARM BLOCK   CATARACT EXTRACTION W/ INTRAOCULAR LENS  IMPLANT, BILATERAL Bilateral ~ 2012   COLONOSCOPY W/ POLYPECTOMY     CORONARY ANGIOPLASTY  1990's   ESOPHAGOGASTRODUODENOSCOPY (EGD) WITH ESOPHAGEAL DILATION  11/2007   ESOPHAGOGASTRODUODENOSCOPY (EGD) WITH PROPOFOL N/A 09/28/2015   Procedure: ESOPHAGOGASTRODUODENOSCOPY (EGD) WITH PROPOFOL;  Surgeon: Clarene Essex, MD;  Location: [facility] ENDOSCOPY;  Service: Endoscopy;  Laterality: N/A;   ESOPHAGOGASTRODUODENOSCOPY (EGD) WITH PROPOFOL N/A 09/09/2017   Procedure: ESOPHAGOGASTRODUODENOSCOPY (EGD) WITH PROPOFOL;  Surgeon: Clarene Essex, MD;  Location: [facility] ENDOSCOPY;  Service: Endoscopy;  Laterality: N/A;   EYE SURGERY Bilateral    JOINT REPLACEMENT     LEFT HEART CATH AND CORONARY ANGIOGRAPHY N/A 08/10/2018   Procedure: LEFT HEART CATH AND CORONARY ANGIOGRAPHY;  Surgeon: Troy Sine, MD;  Location: [place] CV LAB;  Service: Cardiovascular;  Laterality: N/A;   LEFT HEART CATHETERIZATION WITH CORONARY ANGIOGRAM N/A 09/03/2012   Procedure: LEFT HEART CATHETERIZATION WITH CORONARY ANGIOGRAM;  Surgeon: Sinclair Grooms, MD;  Location: [facility] CATH LAB;  Service: Cardiovascular;  Laterality: N/A;   LEFT HEART CATHETERIZATION WITH CORONARY ANGIOGRAM N/A 12/01/2013   Procedure: LEFT HEART CATHETERIZATION WITH CORONARY ANGIOGRAM;  Surgeon: Sinclair Grooms, MD;  Location: [facility] CATH LAB;  Service: Cardiovascular;  Laterality: N/A;   LOWER EXTREMITY ANGIOGRAM Left 05/2005   L pop-pop BPG patent   NAILBED  REPAIR Right 09/17/2016   Procedure: ABLATION NAILBED RIGHT INDEX;  Surgeon: Daryll Brod, MD;  Location: [place];  Service: Orthopedics;  Laterality: Right;   NASAL SEPTUM SURGERY  1977   PENILE PROSTHESIS IMPLANT  1990's   SAVORY DILATION N/A 09/28/2015   Procedure: SAVORY DILATION;  Surgeon: Clarene Essex, MD;  Location: [facility] ENDOSCOPY;  Service: Endoscopy;  Laterality: N/A;  have balloons available    SAVORY DILATION N/A 09/09/2017   Procedure: SAVORY DILATION;  Surgeon: Clarene Essex, MD;  Location: [facility] ENDOSCOPY;  Service: Endoscopy;  Laterality: N/A;   SCROTAL SURGERY  08/1999   pump revision/notes 07/11/2010   SKIN FULL THICKNESS GRAFT Right 09/17/2016   Procedure: SKIN GRAFT FROM UPPER ARM;  Surgeon: Daryll Brod, MD;  Location: [place];  Service: Orthopedics;  Laterality: Right;   TOTAL KNEE ARTHROPLASTY Left 01/2009    Home Medications:  (Not in a hospital admission)  Allergies:  Allergies  Allergen Reactions   Doxycycline Other (See Comments)    Dizziness   Flomax [Tamsulosin] Other (See Comments)    Dizziness    Lipitor [Atorvastatin] Other (See Comments)    Muscle weakness   Rhubarb Other (See Comments)    From childhood- exact reaction not recalled, but is allergic   Sulfa Antibiotics Hives   Sulfacetamide Sodium Hives   Clindamycin/Lincomycin Rash   Other Hives, Itching, Rash and Other (See Comments)    UNNAMED ANTIBIOTIC- SEVERE SYMPTOMS  [place]- From childhood- exact reaction not recalled, but is allergic   Vytorin [Ezetimibe-Simvastatin] Other (See Comments)    Bodyaches    Family History  Problem Relation Age of Onset   Diabetes Father 62       deceased   Peripheral vascular disease Father    Pneumonia Father    Alzheimer's disease Mother 78       deceased   Fibromyalgia Sister 87       deceased   Hypertension Son    Obesity Daughter    Diabetes Daughter    Social History:  reports that he has been smoking cigarettes. He has a 14.75 pack-year smoking history. He has quit using smokeless tobacco.  His smokeless tobacco use included chew. He reports that he does not drink alcohol and does not use drugs.  ROS: A complete review of systems was performed.  All systems are negative except for pertinent findings as noted. ROS   Physical Exam:  Vital signs in last 24 hours: Temp:  [100 F (37.8 C)-100.8 F (38.2  C)] 100 F (37.8 C) (09/24 1208) Pulse Rate:  [71-88] 77 (09/24 1815) Resp:  [17-24] 21 (09/24 1815) BP: (79-141)/(47-73) 141/73 (09/24 1815) SpO2:  [90 %-96 %] 95 % (09/24 1815) Weight:  [90.7 kg] 90.7 kg (09/24 1249) General:  Alert and oriented, No acute distress HEENT: Normocephalic, atraumatic Neck: No JVD or lymphadenopathy Cardiovascular: Regular rate and rhythm Lungs: Regular rate and effort Abdomen: Soft, nontender, nondistended, no abdominal masses Back: No CVA tenderness Extremities: No edema Neurologic: Grossly intact  Laboratory Data:  Results for orders placed or performed during the hospital encounter of 11/19/19 (from the past 24 hour(s))  Comprehensive metabolic panel     Status: Abnormal   Collection Time: 11/19/19 11:30 AM  Result Value Ref Range   Sodium 132 (L) 135 - 145 mmol/L   Potassium 3.9 3.5 - 5.1 mmol/L   Chloride 96 (L) 98 - 111 mmol/L   CO2 26 22 - 32 mmol/L   Glucose, Bld 103 (  H) 70 - 99 mg/dL   BUN 29 (H) 8 - 23 mg/dL   Creatinine, Ser 1.61 (H) 0.61 - 1.24 mg/dL   Calcium 9.1 8.9 - 10.3 mg/dL   Total Protein 6.2 (L) 6.5 - 8.1 g/dL   Albumin 2.9 (L) 3.5 - 5.0 g/dL   AST 19 15 - 41 U/L   ALT 16 0 - 44 U/L   Alkaline Phosphatase 62 38 - 126 U/L   Total Bilirubin 1.1 0.3 - 1.2 mg/dL   GFR calc non Af Amer 40 (L) >60 mL/min   GFR calc Af Amer 46 (L) >60 mL/min   Anion gap 10 5 - 15  CBC     Status: Abnormal   Collection Time: 11/19/19 11:30 AM  Result Value Ref Range   WBC 13.2 (H) 4.0 - 10.5 K/uL   RBC 3.90 (L) 4.22 - 5.81 MIL/uL   Hemoglobin 12.8 (L) 13.0 - 17.0 g/dL   HCT 39.7 39 - 52 %   MCV 101.8 (H) 80.0 - 100.0 fL   MCH 32.8 26.0 - 34.0 pg   MCHC 32.2 30.0 - 36.0 g/dL   RDW 13.6 11.5 - 15.5 %   Platelets 150 150 - 400 K/uL   nRBC 0.0 0.0 - 0.2 %  CBC with Differential/Platelet     Status: Abnormal   Collection Time: 11/19/19 11:30 AM  Result Value Ref Range   WBC 13.3 (H) 4.0 - 10.5 K/uL   RBC 3.89 (L) 4.22 - 5.81 MIL/uL    Hemoglobin 12.9 (L) 13.0 - 17.0 g/dL   HCT 39.7 39 - 52 %   MCV 102.1 (H) 80.0 - 100.0 fL   MCH 33.2 26.0 - 34.0 pg   MCHC 32.5 30.0 - 36.0 g/dL   RDW 13.7 11.5 - 15.5 %   Platelets 163 150 - 400 K/uL   nRBC 0.0 0.0 - 0.2 %   Neutrophils Relative % 92 %   Neutro Abs 12.2 (H) 1.7 - 7.7 K/uL   Lymphocytes Relative 4 %   Lymphs Abs 0.5 (L) 0.7 - 4.0 K/uL   Monocytes Relative 4 %   Monocytes Absolute 0.5 0 - 1 K/uL   Eosinophils Relative 0 %   Eosinophils Absolute 0.0 0 - 0 K/uL   Basophils Relative 0 %   Basophils Absolute 0.0 0 - 0 K/uL   nRBC 0 0 /100 WBC   Abs Immature Granulocytes 0.00 0.00 - 0.07 K/uL  Protime-INR     Status: None   Collection Time: 11/19/19 12:18 PM  Result Value Ref Range   Prothrombin Time 14.9 11.4 - 15.2 seconds   INR 1.2 0.8 - 1.2  APTT     Status: None   Collection Time: 11/19/19 12:18 PM  Result Value Ref Range   aPTT 30 24 - 36 seconds  Lactic acid, plasma     Status: Abnormal   Collection Time: 11/19/19 12:36 PM  Result Value Ref Range   Lactic Acid, Venous 2.2 (HH) 0.5 - 1.9 mmol/L  Urinalysis, Routine w reflex microscopic Urine, Clean Catch     Status: Abnormal   Collection Time: 11/19/19  2:27 PM  Result Value Ref Range   Color, Urine AMBER (A) YELLOW   APPearance HAZY (A) CLEAR   Specific Gravity, Urine 1.009 1.005 - 1.030   pH 5.0 5.0 - 8.0   Glucose, UA NEGATIVE NEGATIVE mg/dL   Hgb urine dipstick LARGE (A) NEGATIVE   Bilirubin Urine NEGATIVE NEGATIVE   Ketones, ur NEGATIVE  NEGATIVE mg/dL   Protein, ur 30 (A) NEGATIVE mg/dL   Nitrite NEGATIVE NEGATIVE   Leukocytes,Ua LARGE (A) NEGATIVE   RBC / HPF 21-50 0 - 5 RBC/hpf   WBC, UA >50 (H) 0 - 5 WBC/hpf   Bacteria, UA RARE (A) NONE SEEN   Squamous Epithelial / LPF 0-5 0 - 5   Mucus PRESENT   Lactic acid, plasma     Status: None   Collection Time: 11/19/19  4:07 PM  Result Value Ref Range   Lactic Acid, Venous 1.8 0.5 - 1.9 mmol/L   Recent Results (from the past 240 hour(s))  SARS  Coronavirus 2 by RT PCR (hospital order, performed in [place] hospital lab) Nasopharyngeal Nasopharyngeal Swab     Status: Abnormal   Collection Time: 11/16/19  7:20 PM   Specimen: Nasopharyngeal Swab  Result Value Ref Range Status   SARS Coronavirus 2 POSITIVE (A) NEGATIVE Final    Comment: RESULT CALLED TO, READ BACK BY AND VERIFIED WITH: D,SIDBURY @2158  11/16/19 EB (NOTE) SARS-CoV-2 target nucleic acids are DETECTED  SARS-CoV-2 RNA is generally detectable in upper respiratory specimens  during the acute phase of infection.  Positive results are indicative  of the presence of the identified virus, but do not rule out bacterial infection or co-infection with other pathogens not detected by the test.  Clinical correlation with patient history and  other diagnostic information is necessary to determine patient infection status.  The expected result is negative.  Fact Sheet for Patients:   StrictlyIdeas.no   Fact Sheet for Healthcare Providers:   BankingDealers.co.za    This test is not yet approved or cleared by the [place] FDA and  has been authorized for detection and/or diagnosis of SARS-CoV-2 by FDA under an Emergency Use Authorization (EUA).  This EUA will remain in effect (meaning this test can b e used) for the duration of  the COVID-19 declaration under Section 564(b)(1) of the Act, 21 U.S.C. section 360-bbb-3(b)(1), unless the authorization is terminated or revoked sooner.  Performed at [facility], [place] [address] [place], [place] [address]    Creatinine: Recent Labs    11/16/19 1135 11/19/19 1130  CREATININE 0.75 1.61*   CT scan personally reviewed and is detailed in history of present illness  Impression/Assessment:  Right ureteral stone Sepsis secondary to UTI  Plan:  Proceed with urgent right ureteral stent placement.  Risk and benefits discussed.  Marton Redwood, III 11/19/2019, 6:45 PM

## 2019-11-19 NOTE — Anesthesia Postprocedure Evaluation (Signed)
Anesthesia Post Note  Patient: [name] KEHOE Sr.  Procedure(s) Performed: CYSTOSCOPY WITH RIGHT URETERAL STENT PLACEMENT (Right Ureter)     Patient location during evaluation: PACU Anesthesia Type: General Level of consciousness: awake Pain management: pain level controlled Vital Signs Assessment: post-procedure vital signs reviewed and stable Respiratory status: spontaneous breathing Cardiovascular status: stable Postop Assessment: no apparent nausea or vomiting Anesthetic complications: no   No complications documented.  Last Vitals:  Vitals:   11/19/19 2300 11/19/19 2316  BP: (!) 164/70 (!) 162/73  Pulse: 76 77  Resp: 19 20  Temp: (!) 36.1 C 36.9 C  SpO2: 100% 97%    Last Pain:  Vitals:   11/19/19 2316  TempSrc: Oral  PainSc:                  Judieth Mckown

## 2019-11-19 NOTE — ED Provider Notes (Signed)
Medical screening examination/treatment/procedure(s) were conducted as a shared visit with non-physician practitioner(s) and myself.  I personally evaluated the patient during the encounter. Briefly, the patient is a 79 y.o. male with history of COPD, CAD who presents the ED with body aches, chills.  Found to be febrile and hypotensive in the emergency department.  Sepsis work-up initiated.  Having cough with sputum production.  Also having some pain with urination and lower abdominal pain.  Will empirically start IV vancomycin and Zosyn as possible intra-abdominal source.  Patient tested positive for Covid last month.  Was here earlier this week for shortness of breath and had a negative CT scan of his chest.  Covid test was still positive.  Will give 30 cc/kg IV fluids.  Empiric IV antibiotics.  Anticipate admission given concern for sepsis.  Will look for intra-abdominal source with CT scan of abdomen and pelvis.  Please see my PAs note for further results, evaluation, disposition of the patient.  This chart was dictated using voice recognition software.  Despite best efforts to proofread,  errors can occur which can change the documentation meaning.     EKG Interpretation  Date/Time:  Friday November 19 2019 12:18:19 EDT Ventricular Rate:  87 PR Interval:    QRS Duration: 142 QT Interval:  375 QTC Calculation: 452 R Axis:   88 Text Interpretation: Sinus rhythm Right bundle branch block Confirmed by Lennice Sites [phone]) on 11/19/2019 12:24:19 PM          Lennice Sites, DO 11/19/19 1230

## 2019-11-19 NOTE — Progress Notes (Addendum)
Pharmacy Antibiotic Note  [name] [name]. is a 79 y.o. male admitted on 11/19/2019 with sepsis.  Pharmacy has been consulted for vancomycin and zosyn dosing.  WBC 13.2, kidney function below baseline with CrCl ~40.  Estimated AUC 550.  Plan: Vancomycin 2,000 mg IV x 1 Vancomycin 1,250mg  IV every 24 hours Zosyn 3.375g IV every 8 hours (extended infusion) Monitor renal function, Cx and clinical progression to narrow Vancomycin trough at steady state  Height: 5\' 9"  (175.3 cm) Weight: 90.7 kg (200 lb) IBW/kg (Calculated) : 70.7  Temp (24hrs), Avg:100.4 F (38 C), Min:100 F (37.8 C), Max:100.8 F (38.2 C)  Recent Labs  Lab 11/16/19 1135 11/19/19 1130  WBC 6.3 13.2*  CREATININE 0.75  --     Estimated Creatinine Clearance: 82 mL/min (by C-G formula based on SCr of 0.75 mg/dL).    Allergies  Allergen Reactions  . Doxycycline Other (See Comments)    Dizzy   . Lipitor [Atorvastatin] Other (See Comments)    Muscle weakness  . Other Other (See Comments)    Antibiotic (pt unsure of name-will find out for [place]) - severe rash, itching and hives  . Sulfa Antibiotics Hives  . Sulfacetamide Sodium Hives  . Vytorin [Ezetimibe-Simvastatin] Other (See Comments)    Body aches   Vancomycin 9/24 >> Zosyn 9/24 >>   9/24 BCx: pending  Mercy Riding, PharmD PGY1 Acute Care Pharmacy Resident Please refer to [facility] for unit-specific pharmacist
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
## 2019-11-19 NOTE — H&P (Signed)
History and Physical    [name] [name] KDX:[id number] DOB: 08/14/1940 DOA: 11/19/2019  PCP: [name], MD (Confirm with patient/family/NH records and if not entered, this has to be entered at [facility] point of entry) Patient coming from: Home  I have personally briefly reviewed patient's old medical records in [place]  Chief Complaint: Confusion  HPI: EVERARD INTERRANTE Sr. is a 79 y.o. male with medical history significant of esophageal stricture and achalasia status post dilations, CAD with MI x2, mild chronic systolic CHF, HTN, BPH, HLD, presented with sudden onset of confusion.  Patient has chronic esophageal stricture and achalasia problems for which she has been following with [facility], for last 2-3 weeks, patient had worsening of symptoms of pain associated with swallowing and trouble swallowing.  He still eats regular food but with significant difficulty despite been taking nitroglycerin 10 minutes before starting to eat meals.  Denies any choking or cough after eating.  He contacted his GI doctor and set up appointment next week.  About 4 5 days ago patient was tested positive for COVID-19, and patient was vaccinated January February this year for COVID-19.  Patient did receive monoclonal antibody on 10/25/2019.  3 days ago patient came to ED complaining about chest pain and shortness of breath.  CTA was negative for PE or acute interstitial infiltrates.  However severe esophagitis was identified.  Daughter at bedside reported the patient complained about cramping-like pain in epigastric area but no back pain or urinary symptoms.  Patient denied any urinary symptoms, no dysuria no abdominal pain or flank pain.  This morning, patient woke up very confused and looked shaky and unsteady.  Family decided to bring patient in.  Patient denied any chest pain short of breath cough, he did have some chills this morning but no fever at home.  Daughter also reported the patient has been having  borderline low blood pressure for last couple months, and set up appointment for cardiology next month.  ED Course: Fever 100.8, WBC 13.2 complaint to 6.9 3 days ago.  CT abdomen showed right obstructive uropathy with 5 mm obstructed ureter stone.  UA showed UTI.  Review of Systems: Unable to perform patient confused   Past Medical History:  Diagnosis Date  . Adenomatous polyp 2007  . Arthritis    "my body"  . B12 deficiency   . BPH (benign prostatic hyperplasia)   . Chronic back pain    "all over"  . Chronic low back pain   . Coronary artery disease 2009  . DDD (degenerative disc disease), cervical   . DDD (degenerative disc disease), lumbosacral   . DDD (degenerative disc disease), thoracic   . Depression   . Diabetes mellitus without complication ([place])    Type II  . Gastroesophageal reflux   . Gynecomastia 06/2009  . Hyperglycemia   . Hyperlipidemia LDL goal < 70   . MI (myocardial infarction) ([place]) 08/1989; 1993  . OSA on CPAP    "have mask; don't wear it" (02/02/2014) 09/25/15- now has a BiPAp- doesn'tt use it  . Peptic ulcer disease 1959  . Right bundle branch block   . Shortness of breath dyspnea   . Tobacco abuse   . Vertebral compression fracture ([place]) 1970    Past Surgical History:  Procedure Laterality Date  . BYPASS GRAFT POPLITEAL TO POPLITEAL Left 06/2005   Dr Donnetta Hutching  . BYPASS GRAFT POPLITEAL TO POPLITEAL Right 02/2001   Dr Deon Pilling  . CARDIAC CATHETERIZATION  1991; 1993; 08/2012; 11/2013; 02/02/2014   I've had 6-7 total  . CARDIAC CATHETERIZATION  02/02/2014   Procedure: LEFT HEART CATH AND CORONARY ANGIOGRAPHY;  Surgeon: Jettie Booze, MD;  mild disease in LAD/CFX, RCA w/ CTO, unsuccessfull PCI both w/ antegrade and retrograde approaches   . CARPAL TUNNEL RELEASE Left 06/01/2019   Procedure: LEFT CARPAL TUNNEL RELEASE;  Surgeon: Daryll Brod, MD;  Location: [facility];  Service: Orthopedics;  Laterality: Left;  FOREARM BLOCK  . CATARACT  EXTRACTION W/ INTRAOCULAR LENS  IMPLANT, BILATERAL Bilateral ~ 2012  . COLONOSCOPY W/ POLYPECTOMY    . CORONARY ANGIOPLASTY  1990's  . ESOPHAGOGASTRODUODENOSCOPY (EGD) WITH ESOPHAGEAL DILATION  11/2007  . ESOPHAGOGASTRODUODENOSCOPY (EGD) WITH PROPOFOL N/A 09/28/2015   Procedure: ESOPHAGOGASTRODUODENOSCOPY (EGD) WITH PROPOFOL;  Surgeon: Clarene Essex, MD;  Location: [facility] ENDOSCOPY;  Service: Endoscopy;  Laterality: N/A;  . ESOPHAGOGASTRODUODENOSCOPY (EGD) WITH PROPOFOL N/A 09/09/2017   Procedure: ESOPHAGOGASTRODUODENOSCOPY (EGD) WITH PROPOFOL;  Surgeon: Clarene Essex, MD;  Location: [facility] ENDOSCOPY;  Service: Endoscopy;  Laterality: N/A;  . EYE SURGERY Bilateral   . JOINT REPLACEMENT    . LEFT HEART CATH AND CORONARY ANGIOGRAPHY N/A 08/10/2018   Procedure: LEFT HEART CATH AND CORONARY ANGIOGRAPHY;  Surgeon: Troy Sine, MD;  Location: [facility] CV LAB;  Service: Cardiovascular;  Laterality: N/A;  . LEFT HEART CATHETERIZATION WITH CORONARY ANGIOGRAM N/A 09/03/2012   Procedure: LEFT HEART CATHETERIZATION WITH CORONARY ANGIOGRAM;  Surgeon: Sinclair Grooms, MD;  Location[facility] CATH LAB;  Service: Cardiovascular;  Laterality: N/A;  . LEFT HEART CATHETERIZATION WITH CORONARY ANGIOGRAM N/A 12/01/2013   Procedure: LEFT HEART CATHETERIZATION WITH CORONARY ANGIOGRAM;  Surgeon: Sinclair Grooms, MD;  Location: [facility] CATH LAB;  Service: Cardiovascular;  Laterality: N/A;  . LOWER EXTREMITY ANGIOGRAM Left 05/2005   L pop-pop BPG patent  . NAILBED REPAIR Right 09/17/2016   Procedure: ABLATION NAILBED RIGHT INDEX;  Surgeon: Daryll Brod, MD;  Location: [facility];  Service: Orthopedics;  Laterality: Right;  . NASAL SEPTUM SURGERY  1977  . PENILE PROSTHESIS IMPLANT  1990's  . SAVORY DILATION N/A 09/28/2015   Procedure: SAVORY DILATION;  Surgeon: Clarene Essex, MD;  Location: [facility] ENDOSCOPY;  Service: Endoscopy;  Laterality: N/A;  have balloons available  . SAVORY DILATION N/A 09/09/2017   Procedure: SAVORY DILATION;   Surgeon: Clarene Essex, MD;  Location: [facility] ENDOSCOPY;  Service: Endoscopy;  Laterality: N/A;  . SCROTAL SURGERY  08/1999   pump revision/notes 07/11/2010  . SKIN FULL THICKNESS GRAFT Right 09/17/2016   Procedure: SKIN GRAFT FROM UPPER ARM;  Surgeon: Daryll Brod, MD;  Location: [facility];  Service: Orthopedics;  Laterality: Right;  . TOTAL KNEE ARTHROPLASTY Left 01/2009     reports that he has been smoking cigarettes. He has a 14.75 pack-year smoking history. He has quit using smokeless tobacco.  His smokeless tobacco use included chew. He reports that he does not drink alcohol and does not use drugs.  Allergies  Allergen Reactions  . Doxycycline Other (See Comments)    Dizziness  . Flomax [Tamsulosin] Other (See Comments)    Dizziness   . Lipitor [Atorvastatin] Other (See Comments)    Muscle weakness  . Rhubarb Other (See Comments)    From childhood- exact reaction not recalled, but is allergic  . Sulfa Antibiotics Hives  . Sulfacetamide Sodium Hives  . Clindamycin/Lincomycin Rash  . Other Hives, Itching, Rash and Other (See Comments)    UNNAMED ANTIBIOTIC- SEVERE SYMPTOMS  [place]- From childhood- exact reaction not recalled, but is allergic  . Vytorin [Ezetimibe-Simvastatin] Other (See Comments)    Bodyaches    Family History  Problem Relation Age of Onset  . Diabetes Father 93       deceased  . Peripheral vascular disease Father   . Pneumonia Father   . Alzheimer's disease Mother 95       deceased  . Fibromyalgia Sister 52       deceased  . Hypertension Son   . Obesity Daughter   . Diabetes Daughter      Prior to Admission medications   Medication Sig Start Date End Date Taking? Authorizing Provider  aspirin EC 81 MG tablet Take 1 tablet (81 mg total) by mouth daily. Resume after 4 days. Patient taking differently: Take 81 mg by mouth daily.  06/17/15  Yes Bonnielee Haff, MD  buPROPion (WELLBUTRIN) 100 MG tablet Take 200 mg by mouth at bedtime.    Yes  [provider]  Cholecalciferol (VITAMIN D3) 50 MCG (2000 UT) TABS Take 2,000 Units by mouth in the morning.   Yes [provider]  docusate sodium (COLACE) 100 MG capsule Take 100 mg by mouth 2 (two) times daily as needed for mild constipation.   Yes [provider]  finasteride (PROSCAR) 5 MG tablet Take 5 mg by mouth daily.   Yes [provider]  HYDROcodone-acetaminophen (NORCO/VICODIN) 5-325 MG tablet Take 1 tablet by mouth every 6 (six) hours as needed for severe pain. 06/17/15  Yes Bonnielee Haff, MD  isosorbide mononitrate (IMDUR) 60 MG 24 hr tablet TAKE 1 AND 1/2 TABLETS(90 MG) BY MOUTH DAILY Patient taking differently: Take 90 mg by mouth daily.  10/08/19  Yes Belva Crome, MD  metoprolol tartrate (LOPRESSOR) 25 MG tablet Take 0.5 tablets (12.5 mg total) by mouth 2 (two) times daily. 10/18/19  Yes Belva Crome, MD  Multiple Vitamin (MULTIVITAMIN WITH MINERALS) TABS tablet Take 1 tablet by mouth daily.   Yes [provider]  nitroGLYCERIN (NITROSTAT) 0.4 MG SL tablet Place 0.4 mg under the tongue See admin instructions. Dissolve 0.4 mg sublingually 10 minutes before each meal for esophageal spasms and as needed for chest pain   Yes [provider]  ondansetron (ZOFRAN) 4 MG tablet Take 4 mg by mouth every 8 (eight) hours as needed for nausea or vomiting.   Yes [provider]  pantoprazole (PROTONIX) 40 MG tablet Take 40 mg by mouth daily.   Yes [provider]  polyethylene glycol powder (GLYCOLAX/MIRALAX) 17 GM/SCOOP powder Take 17 g by mouth daily as needed for mild constipation (MIX AND DRINK).   Yes [provider]  ranolazine (RANEXA) 1000 MG SR tablet Take 1 tablet (1,000 mg total) by mouth 2 (two) times daily. 10/20/19  Yes Belva Crome, MD  rosuvastatin (CRESTOR) 10 MG tablet Take 10 mg by mouth daily.   Yes [provider]  traMADol (ULTRAM) 50 MG tablet Take 1 tablet (50 mg total) by mouth  every 6 (six) hours as needed. Patient taking differently: Take 50 mg by mouth every 6 (six) hours as needed (for pain).  06/01/19  Yes Daryll Brod, MD  vitamin B-12 (CYANOCOBALAMIN) 1000 MCG tablet Take 1,000 mcg by mouth daily.   Yes [provider]  cholecalciferol (VITAMIN D) 1000 units tablet Take 2,000 Units by mouth daily. Patient not taking: Reported on 11/19/2019    [provider]  cyanocobalamin (,VITAMIN B-12,) 1000 MCG/ML injection Inject 1,000  mcg into the muscle every 30 (thirty) days. No specific day of the month. Administered by daughter. Patient not taking: Reported on 11/19/2019    [provider]  meloxicam (MOBIC) 7.5 MG tablet Take 7.5 mg by mouth daily. Patient not taking: Reported on 11/19/2019 04/09/19   [provider]  tamsulosin (FLOMAX) 0.4 MG CAPS capsule Take 0.4 mg by mouth at bedtime.  Patient not taking: Reported on 11/19/2019 10/12/13   [provider]    Physical Exam: Vitals:   11/19/19 1515 11/19/19 1600 11/19/19 1700 11/19/19 1715  BP: (!) 114/55 (!) 109/56 134/72 120/64  Pulse: 85 77 75 75  Resp: 20 (!) 21 19 19   Temp:      TempSrc:      SpO2: 96% 92% 91% 94%  Weight:      Height:        Constitutional: NAD, calm, comfortable Vitals:   11/19/19 1515 11/19/19 1600 11/19/19 1700 11/19/19 1715  BP: (!) 114/55 (!) 109/56 134/72 120/64  Pulse: 85 77 75 75  Resp: 20 (!) 21 19 19   Temp:      TempSrc:      SpO2: 96% 92% 91% 94%  Weight:      Height:       Eyes: PERRL, lids and conjunctivae normal ENMT: Mucous membranes are dry. Posterior pharynx clear of any exudate or lesions.Normal dentition.  Neck: normal, supple, no masses, no thyromegaly Respiratory: clear to auscultation bilaterally, no wheezing, no crackles. Normal respiratory effort. No accessory muscle use.  Cardiovascular: Regular rate and rhythm, no murmurs / rubs / gallops. No extremity edema. 2+ pedal pulses. No carotid bruits.  Abdomen: no  tenderness, no masses palpated. No hepatosplenomegaly. Bowel sounds positive.  Musculoskeletal: no clubbing / cyanosis. No joint deformity upper and lower extremities. Good ROM, no contractures. Normal muscle tone.  Skin: no rashes, lesions, ulcers. No induration Neurologic: CN 2-12 grossly intact. Sensation intact, DTR normal. Strength 5/5 in all 4.  Psychiatric: Awake lethargic confused    Labs on Admission: I have personally reviewed following labs and imaging studies  CBC: Recent Labs  Lab 11/16/19 1135 11/19/19 1130  WBC 6.3 13.3*  13.2*  NEUTROABS  --  12.2*  HGB 12.9* 12.9*  12.8*  HCT 39.8 39.7  39.7  MCV 102.6* 102.1*  101.8*  PLT 200 163  885   Basic Metabolic Panel: Recent Labs  Lab 11/16/19 1135 11/19/19 1130  NA 136 132*  K 4.4 3.9  CL 104 96*  CO2 26 26  GLUCOSE 112* 103*  BUN 14 29*  CREATININE 0.75 1.61*  CALCIUM 9.2 9.1   GFR: Estimated Creatinine Clearance: 40.7 mL/min (A) (by C-G formula based on SCr of 1.61 mg/dL (H)). Liver Function Tests: Recent Labs  Lab 11/19/19 1130  AST 19  ALT 16  ALKPHOS 62  BILITOT 1.1  PROT 6.2*  ALBUMIN 2.9*   No results for input(s): LIPASE, AMYLASE in the last 168 hours. No results for input(s): AMMONIA in the last 168 hours. Coagulation Profile: Recent Labs  Lab 11/19/19 1218  INR 1.2   Cardiac Enzymes: No results for input(s): CKTOTAL, CKMB, CKMBINDEX, TROPONINI in the last 168 hours. BNP (last 3 results) No results for input(s): PROBNP in the last 8760 hours. HbA1C: No results for input(s): HGBA1C in the last 72 hours. CBG: No results for input(s): GLUCAP in the last 168 hours. Lipid Profile: No results for input(s): CHOL, HDL, LDLCALC, TRIG, CHOLHDL, LDLDIRECT in the last 72 hours. Thyroid  Function Tests: No results for input(s): TSH, T4TOTAL, FREET4, T3FREE, THYROIDAB in the last 72 hours. Anemia Panel: No results for input(s): VITAMINB12, FOLATE, FERRITIN, TIBC, IRON, RETICCTPCT in the  last 72 hours. Urine analysis:    Component Value Date/Time   COLORURINE AMBER (A) 11/19/2019 1427   APPEARANCEUR HAZY (A) 11/19/2019 1427   LABSPEC 1.009 11/19/2019 1427   PHURINE 5.0 11/19/2019 1427   GLUCOSEU NEGATIVE 11/19/2019 1427   HGBUR LARGE (A) 11/19/2019 1427   BILIRUBINUR NEGATIVE 11/19/2019 1427   KETONESUR NEGATIVE 11/19/2019 1427   PROTEINUR 30 (A) 11/19/2019 1427   UROBILINOGEN 1.0 01/31/2009 1427   NITRITE NEGATIVE 11/19/2019 1427   LEUKOCYTESUR LARGE (A) 11/19/2019 1427    Radiological Exams on Admission: CT Abdomen Pelvis Wo Contrast  Result Date: 11/19/2019 CLINICAL DATA:  Body aches, chills, hypotensive, sepsis EXAM: CT ABDOMEN AND PELVIS WITHOUT CONTRAST TECHNIQUE: Multidetector CT imaging of the abdomen and pelvis was performed following the standard protocol without IV contrast. COMPARISON:  08/17/2018 FINDINGS: Lower chest: No acute pleural or parenchymal lung disease. Hepatobiliary: No focal liver abnormality is seen. No gallstones, gallbladder wall thickening, or biliary dilatation. Pancreas: Unremarkable. No pancreatic ductal dilatation or surrounding inflammatory changes. Spleen: Normal in size without focal abnormality. Adrenals/Urinary Tract: There is an obstructing 5 mm mid right ureteral calculus reference image 55, with significant right-sided obstructive uropathy. Mild right perinephric and Peri ureteral fat stranding, concerning for superimposed infection. No left-sided calculi or obstructive uropathy. There are bilateral renal cortical cysts unchanged. The adrenals are normal. The bladder is moderately distended without focal abnormality. Stomach/Bowel: No bowel obstruction or ileus. Moderate stool throughout the colon. Normal appendix right lower quadrant. No bowel wall thickening or inflammatory change. Vascular/Lymphatic: Aortic atherosclerosis. No enlarged abdominal or pelvic lymph nodes. Reproductive: Calcified prostatic concretions are noted. Otherwise  the prostate is unremarkable. Other: No free fluid or free gas. No abdominal wall hernia. Penile prosthesis is identified. Musculoskeletal: No acute or destructive bony lesions. Reconstructed images demonstrate no additional findings. IMPRESSION: 1. Obstructing 5 mm mid right ureteral calculus, with right-sided obstructive uropathy. Right perinephric and Peri ureteral fat stranding may suggest superimposed infection. 2.  Aortic Atherosclerosis (ICD10-I70.0). Electronically Signed   By: Randa Ngo M.D.   On: 11/19/2019 15:28   DG Chest Port 1 View  Result Date: 11/19/2019 CLINICAL DATA:  Question sepsis.  Altered mental status EXAM: PORTABLE CHEST 1 VIEW COMPARISON:  11/16/2019 FINDINGS: Cardiac and mediastinal contours normal. Vascularity normal. Atherosclerotic calcification aortic arch. Lungs are well aerated and clear.  No infiltrate or effusion. IMPRESSION: No active disease. Electronically Signed   By: Franchot Gallo M.D.   On: 11/19/2019 12:45    EKG: Independently reviewed.  Chronic RBBB  Assessment/Plan Active Problems:   Sepsis ([place])  (please populate well all problems here in Problem List. (For example, if patient is on BP meds at home and you resume or decide to hold them, it is a problem that needs to be her. Same for CAD, COPD, HLD and so on)  Sepsis secondary to complicated UTI -Sepsis evidenced by fever, leukocytosis, hypotension, elevated lactate, with signs of endorgan damage of AKI and encephalopathy -Received vancomycin and Zosyn in the ED, no history of resistant UTI in the past as per family, will de-escalate to ceftriaxone -Blood pressure still borderline low, after IV boluses, will start maintenance IV fluid  Complicated UTI with obstructive uropathy -Dr. Florene Glen from urology was contacted and plan for cystoscopy and right ureteral stenting today -Antibiotics as above  COVID-19 infection -Was tested positive more than 3 weeks ago but still tested positive today,  probably should consider a carrier/asymptomatic state? -Received monoclonal antibodies and was vaccinated earlier this year -CT scan 3 days ago and today showed no signs of acute infiltrate and patient does not have any respiratory symptoms and sepsis can be explained by complicated UTI, no indication for further antiviral treatment.  Acute on chronic dysphagia and odynophagia -Secondary to worsening of esophageal stricture and achalasia -As per patient family, patient still able to swallow and eat.  Continue the as needed nitroglycerin for swallowing difficulties and other symptomatic management such as viscous lidocaine -As of now the plan is to outpatient GI follow-up  Chronic hypertension -Check TSH -Check echo  Chronic mild systolic CHF -Check echo  CAD -Continue ranolazine -Hold Imdur until blood pressure recovers  AKI -As above  HTN -Hold BP meds until sepsis resolves   DVT prophylaxis: Heparin subcu  code Status: Full code Family Communication: Daughter at bedside Disposition Plan: Patient has complicated medical issues came with sepsis, underlying CHF, AKI, esophageal issue, expect more than 3 days hospital stay to treat the above mentioned symptoms Consults called: Urology Admission status: PCU   Lequita Halt MD [facility] Pager [phone]  11/19/2019, 6:11 PM

## 2019-11-19 NOTE — Op Note (Signed)
Operative Note  Preoperative diagnosis:  1.  Right ureteral calculus  Post operative diagnosis: 1.  Right ureteral calculus  Procedure(s): 1.  Cystoscopy with right retrograde pyelogram and right ureteral stent placement  Surgeon: Link Snuffer, MD  Assistants: None  Anesthesia: General  Complications: None immediate  EBL: Minimal  Specimens: 1.  None  Drains/Catheters: 1.  6 X 26 double-J ureteral stent 2.  Foley catheter  Intraoperative findings: 1.  Normal urethra and bladder 2.  Right retrograde pyelogram revealed a filling defect at the level of the stone with upstream hydroureteronephrosis  Indication: 79 year old male presented with altered mental status and hypotension and a low-grade fever.  CT scan of the abdomen revealed a 5 mm right ureteral stone.  He presents for urgent ureteral stent placement.  Description of procedure:  The patient was identified and consent was obtained.  The patient was taken to the operating room and placed in the supine position.  The patient was placed under general anesthesia.  Perioperative antibiotics were administered.  The patient was placed in dorsal lithotomy.  Patient was prepped and draped in a standard sterile fashion and a timeout was performed.  A 21 French rigid cystoscope was advanced into the urethra and into the bladder.  The right distal most portion of the ureter was cannulated with an open-ended ureteral catheter.  Retrograde pyelogram was performed with the findings noted above.  A sensor wire was then advanced up to the kidney under fluoroscopic guidance.  A 6 X 26 double-J ureteral stent was advanced up to the kidney under fluoroscopic guidance.  The wire was withdrawn and fluoroscopy confirmed good proximal placement and direct visualization confirmed a good coil within the bladder.  The scope was withdrawn and a Foley catheter was placed. Patient tolerated procedure well and was stable postoperatively.  Plan: Continue  Foley catheter.  Can likely undergo a voiding trial tomorrow morning or the next morning.  Continue antibiotics until cultures return.

## 2019-11-19 NOTE — Anesthesia Procedure Notes (Signed)
Procedure Name: Intubation Date/Time: 11/19/2019 9:38 PM Performed by: Claris Che, CRNA Pre-anesthesia Checklist: Patient identified, Emergency Drugs available, Suction available, Patient being monitored and Timeout performed Patient Re-evaluated:Patient Re-evaluated prior to induction Oxygen Delivery Method: Circle system utilized Preoxygenation: Pre-oxygenation with 100% oxygen Induction Type: IV induction, Rapid sequence and Cricoid Pressure applied Laryngoscope Size: Mac and 4 Grade View: Grade II Tube type: Oral Tube size: 7.5 mm Number of attempts: 1 Airway Equipment and Method: Stylet Placement Confirmation: ETT inserted through vocal cords under direct vision,  positive ETCO2 and breath sounds checked- equal and bilateral Secured at: 23 cm Tube secured with: Tape Dental Injury: Teeth and Oropharynx as per pre-operative assessment

## 2019-11-19 NOTE — Transfer of Care (Signed)
Immediate Anesthesia Transfer of Care Note  Patient: [name] Abu Sr.  Procedure(s) Performed: CYSTOSCOPY WITH RIGHT URETERAL STENT PLACEMENT (Right Ureter)  Patient Location: PACU  Anesthesia Type:General  Level of Consciousness: awake, oriented and patient cooperative  Airway & Oxygen Therapy: Patient Spontanous Breathing and Patient connected to nasal cannula oxygen  Post-op Assessment: Report given to RN, Post -op Vital signs reviewed and stable and Patient moving all extremities X 4  Post vital signs: Reviewed and stable  Last Vitals:  Vitals Value Taken Time  BP    Temp    Pulse 78 11/19/19 2216  Resp 15 11/19/19 2216  SpO2 98 % 11/19/19 2216  Vitals shown include unvalidated device data.  Last Pain:  Vitals:   11/19/19 2036  TempSrc: Oral  PainSc:          Complications: No complications documented.

## 2019-11-19 NOTE — ED Triage Notes (Addendum)
Pt arrives with family who states pt initially tested pos for covid 1 month ago, pt seen here a few days ago for abd pain and found that patient has severely inflamed esophagus, today pt started becoming confused, having chills. Pt alert, oriented to person and place. Daughter also reports pt was incontinent of urine this morning which is new for him.

## 2019-11-19 NOTE — Progress Notes (Signed)
Spoke with Dr Roosevelt Locks of [facility].  Patient [name] [name] originally tested covid positive four plus weeks ago and received anitbodies infusion on 10/25/2019.  Per Dr. Roosevelt Locks, patient does not need covid isolation for this admission.  Will advise OR and patient placement and plan accordingly.

## 2019-11-19 NOTE — Anesthesia Preprocedure Evaluation (Signed)
Anesthesia Evaluation  Patient identified by MRN, date of birth, ID band Patient awake    Reviewed: Allergy & Precautions, NPO status , Patient's Chart, lab work & pertinent test results  Airway Mallampati: II  TM Distance: >3 FB     Dental   Pulmonary shortness of breath, sleep apnea , COPD, Current Smoker and Patient abstained from smoking.,    breath sounds clear to auscultation       Cardiovascular hypertension, + angina + CAD and + Past MI  + dysrhythmias  Rhythm:Regular Rate:Normal     Neuro/Psych PSYCHIATRIC DISORDERS Depression    GI/Hepatic PUD, GERD  ,  Endo/Other  diabetes  Renal/GU Renal disease     Musculoskeletal   Abdominal   Peds  Hematology   Anesthesia Other Findings   Reproductive/Obstetrics                             Anesthesia Physical Anesthesia Plan  ASA: III  Anesthesia Plan: General   Post-op Pain Management:    Induction: Intravenous  PONV Risk Score and Plan: 2 and Ondansetron and Dexamethasone  Airway Management Planned: Oral ETT  Additional Equipment:   Intra-op Plan:   Post-operative Plan: Extubation in OR  Informed Consent: I have reviewed the patients History and Physical, chart, labs and discussed the procedure including the risks, benefits and alternatives for the proposed anesthesia with the patient or authorized representative who has indicated his/her understanding and acceptance.     Dental advisory given  Plan Discussed with: CRNA and Anesthesiologist  Anesthesia Plan Comments:         Anesthesia Quick Evaluation

## 2019-11-20 ENCOUNTER — Encounter (HOSPITAL_COMMUNITY): Payer: Self-pay | Admitting: Urology

## 2019-11-20 ENCOUNTER — Inpatient Hospital Stay (HOSPITAL_COMMUNITY): Payer: Medicare Other

## 2019-11-20 DIAGNOSIS — I5022 Chronic systolic (congestive) heart failure: Secondary | ICD-10-CM

## 2019-11-20 DIAGNOSIS — N139 Obstructive and reflux uropathy, unspecified: Secondary | ICD-10-CM

## 2019-11-20 DIAGNOSIS — N179 Acute kidney failure, unspecified: Secondary | ICD-10-CM

## 2019-11-20 DIAGNOSIS — I10 Essential (primary) hypertension: Secondary | ICD-10-CM

## 2019-11-20 DIAGNOSIS — D649 Anemia, unspecified: Secondary | ICD-10-CM

## 2019-11-20 DIAGNOSIS — R6520 Severe sepsis without septic shock: Secondary | ICD-10-CM

## 2019-11-20 DIAGNOSIS — K220 Achalasia of cardia: Secondary | ICD-10-CM

## 2019-11-20 LAB — GLUCOSE, CAPILLARY
Glucose-Capillary: 186 mg/dL — ABNORMAL HIGH (ref 70–99)
Glucose-Capillary: 194 mg/dL — ABNORMAL HIGH (ref 70–99)
Glucose-Capillary: 167 mg/dL — ABNORMAL HIGH (ref 70–99)

## 2019-11-20 LAB — BLOOD CULTURE ID PANEL (REFLEXED) - BCID2

## 2019-11-20 LAB — BASIC METABOLIC PANEL
Anion gap: 9 (ref 5–15)
BUN: 22 mg/dL (ref 8–23)
CO2: 27 mmol/L (ref 22–32)
Calcium: 9.2 mg/dL (ref 8.9–10.3)
Chloride: 105 mmol/L (ref 98–111)
Creatinine, Ser: 1.1 mg/dL (ref 0.61–1.24)
GFR calc Af Amer: >60 mL/min (ref >60)
GFR calc non Af Amer: >60 mL/min (ref >60)
Glucose, Bld: 188 mg/dL — ABNORMAL HIGH (ref 70–99)
Potassium: 4.5 mmol/L (ref 3.5–5.1)
Sodium: 141 mmol/L (ref 135–145)

## 2019-11-20 LAB — CBC
HCT: 36.6 % — ABNORMAL LOW (ref 39.0–52.0)
Hemoglobin: 11.9 g/dL — ABNORMAL LOW (ref 13.0–17.0)
MCH: 32.8 pg (ref 26.0–34.0)
MCHC: 32.5 g/dL (ref 30.0–36.0)
MCV: 100.8 fL — ABNORMAL HIGH (ref 80.0–100.0)
Platelets: 126 10*3/uL — ABNORMAL LOW (ref 150–400)
RBC: 3.63 MIL/uL — ABNORMAL LOW (ref 4.22–5.81)
RDW: 13.5 % (ref 11.5–15.5)
WBC: 9.6 10*3/uL (ref 4.0–10.5)
nRBC: 0 % (ref 0.0–0.2)

## 2019-11-20 LAB — ECHOCARDIOGRAM COMPLETE
Height: 69 in
S' Lateral: 3.7 cm
Weight: 3052.8 oz

## 2019-11-20 MED ORDER — METOPROLOL TARTRATE 12.5 MG HALF TABLET
12.5000 mg | ORAL_TABLET | Freq: Two times a day (BID) | ORAL | Status: DC
  Administered 2019-11-20 – 2019-11-23 (×7): 12.5 mg via ORAL
  Filled 2019-11-20 (×7): qty 1

## 2019-11-20 MED ORDER — TRAMADOL HCL 50 MG PO TABS: 50.0000 mg | ORAL_TABLET | Freq: Four times a day (QID) | ORAL | Status: DC | PRN

## 2019-11-20 MED ORDER — TAMSULOSIN HCL 0.4 MG PO CAPS
0.4000 mg | ORAL_CAPSULE | Freq: Every day | ORAL | Status: DC
  Administered 2019-11-20 – 2019-11-22 (×3): 0.4 mg via ORAL
  Filled 2019-11-20 (×3): qty 1

## 2019-11-20 MED ORDER — CEFAZOLIN SODIUM-DEXTROSE 2-4 GM/100ML-% IV SOLN
2.0000 g | Freq: Three times a day (TID) | INTRAVENOUS | Status: DC
  Administered 2019-11-20 – 2019-11-23 (×9): 2 g via INTRAVENOUS
  Filled 2019-11-20 (×9): qty 100

## 2019-11-20 MED ORDER — HYDROCODONE-ACETAMINOPHEN 5-325 MG PO TABS: 1.0000 | ORAL_TABLET | Freq: Four times a day (QID) | ORAL | Status: DC | PRN

## 2019-11-20 MED ORDER — NITROGLYCERIN 0.4 MG SL SUBL
0.4000 mg | SUBLINGUAL_TABLET | SUBLINGUAL | Status: DC | PRN
  Administered 2019-11-20 – 2019-11-21 (×3): 0.4 mg via SUBLINGUAL

## 2019-11-20 MED ORDER — ENSURE ENLIVE PO LIQD
237.0000 mL | Freq: Two times a day (BID) | ORAL | Status: DC
  Administered 2019-11-20 – 2019-11-23 (×6): 237 mL via ORAL

## 2019-11-20 MED ORDER — CHLORHEXIDINE GLUCONATE CLOTH 2 % EX PADS
6.0000 | MEDICATED_PAD | Freq: Every day | CUTANEOUS | Status: DC
  Administered 2019-11-20 – 2019-11-21 (×2): 6 via TOPICAL

## 2019-11-20 NOTE — Progress Notes (Signed)
Pt and family member state that the pt was told to take [facility] with each meal due to chest pain with eating.

## 2019-11-20 NOTE — Progress Notes (Signed)
Initial Nutrition Assessment  DOCUMENTATION CODES:   Not applicable  INTERVENTION:  Continue Ensure Enlive po BID, each supplement provides 350 kcal and 20 grams of protein.  Encourage adequate PO intake.   NUTRITION DIAGNOSIS:   Increased nutrient needs related to chronic illness (CHF) as evidenced by estimated needs.  GOAL:   Patient will meet greater than or equal to 90% of their needs  MONITOR:   PO intake, Supplement acceptance, Skin, Weight trends, I & O's, Labs  REASON FOR ASSESSMENT:   Malnutrition Screening Tool    ASSESSMENT:   79 y.o. male past medical history significant for esophageal stricture and achalasia status post dilation, CAD status post MI, chronic systolic heart failure presented with sudden onset of confusion, he was found to be febrile with a white count of 13, CT scan of the abdomen and pelvis showed a right obstructed 5 mm urethral stone.   9/24 Cystoscopy with right retrograde pyelogram and right ureteral stent placement  Pt unavailable during attempted time of contact. RD unable to obtain pt nutrition history at this time. Meal completion has been 100%. Per MD, pt appetite has improved. Pt currently has Ensure ordered and has been consuming them.   Unable to complete Nutrition-Focused physical exam at this time. RD working remotely.  Labs and medications reviewed.   Diet Order:   Diet Order            Diet Heart Room service appropriate? Yes; Fluid consistency: Thin  Diet effective now                 EDUCATION NEEDS:   Not appropriate for education at this time  Skin:  Skin Assessment: Reviewed RN Assessment  Last BM:  9/25  Height:   Ht Readings from Last 1 Encounters:  11/19/19 5\' 9"  (1.753 m)    Weight:   Wt Readings from Last 1 Encounters:  11/20/19 86.5 kg   BMI:  Body mass index is 28.18 kg/m.  Estimated Nutritional Needs:   Kcal:  1900-2100  Protein:  90-100 grams  Fluid:  >/= 1.9 L/day  Corrin Parker, MS, RD, LDN RD pager number/after hours weekend pager number on Amion.

## 2019-11-20 NOTE — Progress Notes (Signed)
Patient feeling much improved. AFVSS  Ucx + staph epidermitis, Bcx + staph, on ancef  Foley light blood tinged  A:  Right ureteral stone Sepsis 2/2 to uti  P: Dc foley in the am F/u in a couple weeks outpatient ureteroscopy Total 14 days abx when goes home

## 2019-11-20 NOTE — Progress Notes (Signed)
[facility] PROGRESS NOTE    Progress Note  Lazar Tierce  YSA:[id number] DOB: [birth year] DOA: 11/19/2019 PCP: [name], MD     Brief Narrative:   [name] Abu Sr. is an 79 y.o. male past medical history significant for esophageal stricture and achalasia status post dilation, CAD status post MI, chronic systolic heart failure presented with sudden onset of confusion, he was found to be febrile with a white count of 13, CT scan of the abdomen and pelvis showed a right obstructed 5 mm urethral stone.  Urology was consulted  Assessment/Plan:   Severe sepsis secondary to acute UTI in the setting of 5 mm nephrolithiasis: He was started on IV fluid resuscitation IV vancomycin and Zosyn.  Which was deescalated to IV Rocephin.  Remained afebrile and leukocytosis is resolved. Pressure has been holding steady, culture data has been sent and is pending. Urology was consulted which performed cystoscopy with retrograde pyelogram and ureteral stent placement on 11/19/2019. Will need to [facility] IV fluids as he will going to pulmonary edema due to his history of systolic heart failure.  COVID-19 infection: Patient currently has no respiratory symptoms using asymptomatic carrier as he has been vaccinated and has received Regeneron infusion. Sats stable no further work-up.  Dysphagia: likely due to achalasia continue nitroglycerin as needed will need to follow-up with GI as an outpatient.  Essential hypertension Resume metoprolol blood pressure is trending up, continue to hold Imdur.  AKI (acute kidney injury) due to postobstructive uropathy: Status post stent placement his creatinine has returned to normal.  Chronic systolic CHF (congestive heart failure) ([place]) According to admit her 2D echo is pending.  Normocytic anemia: We will need to follow-up with GI as an outpatient.   DVT prophylaxis: lovenox Family Communication:none Status is: Inpatient  Remains inpatient appropriate  because:Hemodynamically unstable   Dispo: The patient is from: Home              Anticipated d/c is to: Home              Anticipated d/c date is: 2 days              Patient currently is not medically stable to d/c.    Code Status:     Code Status Orders  (From admission, onward)         Start     Ordered   11/19/19 1807  Full code  Continuous        11/19/19 1808        Code Status History    Date Active Date Inactive Code Status Order ID Comments User Context   08/10/2018 1002 08/10/2018 1553 Full Code [id number]  Troy Sine, MD Inpatient   06/16/2015 1838 06/17/2015 1543 Full Code [id number]  Sid Falcon, MD Inpatient   02/02/2014 1450 02/03/2014 1351 Full Code [id number]  Jettie Booze, MD Inpatient   12/01/2013 0911 12/01/2013 1448 Full Code [id number]  Belva Crome, MD Inpatient   Advance Care Planning Activity        IV Access:    Peripheral IV   Procedures and diagnostic studies:   CT Abdomen Pelvis Wo Contrast  Result Date: 11/19/2019 CLINICAL DATA:  Body aches, chills, hypotensive, sepsis EXAM: CT ABDOMEN AND PELVIS WITHOUT CONTRAST TECHNIQUE: Multidetector CT imaging of the abdomen and pelvis was performed following the standard protocol without IV contrast. COMPARISON:  08/17/2018 FINDINGS: Lower chest: No acute pleural or parenchymal lung disease. Hepatobiliary: No focal liver abnormality  is seen. No gallstones, gallbladder wall thickening, or biliary dilatation. Pancreas: Unremarkable. No pancreatic ductal dilatation or surrounding inflammatory changes. Spleen: Normal in size without focal abnormality. Adrenals/Urinary Tract: There is an obstructing 5 mm mid right ureteral calculus reference image 55, with significant right-sided obstructive uropathy. Mild right perinephric and Peri ureteral fat stranding, concerning for superimposed infection. No left-sided calculi or obstructive uropathy. There are bilateral renal cortical cysts unchanged. The  adrenals are normal. The bladder is moderately distended without focal abnormality. Stomach/Bowel: No bowel obstruction or ileus. Moderate stool throughout the colon. Normal appendix right lower quadrant. No bowel wall thickening or inflammatory change. Vascular/Lymphatic: Aortic atherosclerosis. No enlarged abdominal or pelvic lymph nodes. Reproductive: Calcified prostatic concretions are noted. Otherwise the prostate is unremarkable. Other: No free fluid or free gas. No abdominal wall hernia. Penile prosthesis is identified. Musculoskeletal: No acute or destructive bony lesions. Reconstructed images demonstrate no additional findings. IMPRESSION: 1. Obstructing 5 mm mid right ureteral calculus, with right-sided obstructive uropathy. Right perinephric and Peri ureteral fat stranding may suggest superimposed infection. 2.  Aortic Atherosclerosis (ICD10-I70.0). Electronically Signed   By: Randa Ngo M.D.   On: 11/19/2019 15:28   DG Cystogram  Result Date: 11/19/2019 CLINICAL DATA:  Stent placement, right-sided obstructive uropathy EXAM: DG C-ARM 1-60 MIN; CYSTOGRAM - 3+ VIEW FLUOROSCOPY TIME:  Fluoroscopy Time:  11 seconds Radiation Exposure Index (if provided by the fluoroscopic device): 3.049 mGy Number of Acquired Spot Images: 16 COMPARISON:  11/19/2019 FINDINGS: 16 fluoroscopic images are obtained during the performance of the procedure and are provided for interpretation only. Cystoscope is identified, with cannulation and opacification of the right ureter. Tortuosity of the mid right ureter is noted. Subsequent placement of a right ureteral stent, proximal aspect coiled in the right renal pelvis and distal aspect coiled within the bladder. Please refer to the operative report. IMPRESSION: 1. Cystoscopy and right ureteral stent placement as above. Electronically Signed   By: Randa Ngo M.D.   On: 11/19/2019 22:30   DG Chest Port 1 View  Result Date: 11/19/2019 CLINICAL DATA:  Question sepsis.   Altered mental status EXAM: PORTABLE CHEST 1 VIEW COMPARISON:  11/16/2019 FINDINGS: Cardiac and mediastinal contours normal. Vascularity normal. Atherosclerotic calcification aortic arch. Lungs are well aerated and clear.  No infiltrate or effusion. IMPRESSION: No active disease. Electronically Signed   By: Franchot Gallo M.D.   On: 11/19/2019 12:45   DG C-Arm 1-60 Min  Result Date: 11/19/2019 CLINICAL DATA:  Stent placement, right-sided obstructive uropathy EXAM: DG C-ARM 1-60 MIN; CYSTOGRAM - 3+ VIEW FLUOROSCOPY TIME:  Fluoroscopy Time:  11 seconds Radiation Exposure Index (if provided by the fluoroscopic device): 3.049 mGy Number of Acquired Spot Images: 16 COMPARISON:  11/19/2019 FINDINGS: 16 fluoroscopic images are obtained during the performance of the procedure and are provided for interpretation only. Cystoscope is identified, with cannulation and opacification of the right ureter. Tortuosity of the mid right ureter is noted. Subsequent placement of a right ureteral stent, proximal aspect coiled in the right renal pelvis and distal aspect coiled within the bladder. Please refer to the operative report. IMPRESSION: 1. Cystoscopy and right ureteral stent placement as above. Electronically Signed   By: Randa Ngo M.D.   On: 11/19/2019 22:30     Medical Consultants:    None.  Anti-Infectives:   rocephin  Subjective:    [name] Abu Sr. relates he feels much better today his appetite has returned.  Does not remember the events of the last 24  hours.  Objective:    Vitals:   11/20/19 0149 11/20/19 0405 11/20/19 0529 11/20/19 0713  BP: (!) 114/58 (!) 154/71  (!) 147/75  Pulse: 77 74  68  Resp: 18 18    Temp:  97.6 F (36.4 C)  98.1 F (36.7 C)  TempSrc:  Oral  Oral  SpO2: 93% 95%  96%  Weight:   86.5 kg   Height:       SpO2: 96 % O2 Flow Rate (L/min): 2 L/min   Intake/Output Summary (Last 24 hours) at 11/20/2019 0823 Last data filed at 11/20/2019 0531 Gross per 24 hour    Intake 6481 ml  Output 3350 ml  Net 3131 ml   Filed Weights   11/19/19 1249 11/19/19 2036 11/20/19 0529  Weight: 90.7 kg 86.1 kg 86.5 kg    Exam: General exam: In no acute distress. Respiratory system: Good air movement and clear to auscultation. Cardiovascular system: S1 & S2 heard, RRR. No JVD. Gastrointestinal system: Abdomen is nondistended, soft and nontender.  Extremities: No pedal edema. Skin: No rashes, lesions or ulcers Psychiatry: Judgement and insight appear normal. Mood & affect appropriate.    Data Reviewed:    Labs: Basic Metabolic Panel: Recent Labs  Lab 11/16/19 1135 11/16/19 1135 11/19/19 1130 11/20/19 0253  NA 136  --  132* 141  K 4.4   < > 3.9 4.5  CL 104  --  96* 105  CO2 26  --  26 27  GLUCOSE 112*  --  103* 188*  BUN 14  --  29* 22  CREATININE 0.75  --  1.61* 1.10  CALCIUM 9.2  --  9.1 9.2   < > = values in this interval not displayed.   GFR Estimated Creatinine Clearance: 58.3 mL/min (by C-G formula based on SCr of 1.1 mg/dL). Liver Function Tests: Recent Labs  Lab 11/19/19 1130  AST 19  ALT 16  ALKPHOS 62  BILITOT 1.1  PROT 6.2*  ALBUMIN 2.9*   No results for input(s): LIPASE, AMYLASE in the last 168 hours. No results for input(s): AMMONIA in the last 168 hours. Coagulation profile Recent Labs  Lab 11/19/19 1218  INR 1.2   COVID-19 Labs  No results for input(s): DDIMER, FERRITIN, LDH, CRP in the last 72 hours.  Lab Results  Component Value Date   SARSCOV2NAA POSITIVE (A) 11/16/2019   [place] NEGATIVE 05/28/2019   [place] NOT DETECTED 08/07/2018    CBC: Recent Labs  Lab 11/16/19 1135 11/19/19 1130 11/20/19 0253  WBC 6.3 13.3*   13.2* 9.6  NEUTROABS  --  12.2*  --   HGB 12.9* 12.9*   12.8* 11.9*  HCT 39.8 39.7   39.7 36.6*  MCV 102.6* 102.1*   101.8* 100.8*  PLT 200 163   150 126*   Cardiac Enzymes: No results for input(s): CKTOTAL, CKMB, CKMBINDEX, TROPONINI in the last 168 hours. BNP (last 3  results) No results for input(s): PROBNP in the last 8760 hours. CBG: Recent Labs  Lab 11/19/19 2047 11/20/19 0625  GLUCAP 109* 186*   D-Dimer: No results for input(s): DDIMER in the last 72 hours. Hgb A1c: No results for input(s): HGBA1C in the last 72 hours. Lipid Profile: No results for input(s): CHOL, HDL, LDLCALC, TRIG, CHOLHDL, LDLDIRECT in the last 72 hours. Thyroid function studies: No results for input(s): TSH, T4TOTAL, T3FREE, THYROIDAB in the last 72 hours.  Invalid input(s): FREET3 Anemia work up: No results for input(s): VITAMINB12, FOLATE, FERRITIN, TIBC, IRON, RETICCTPCT  in the last 72 hours. Sepsis Labs: Recent Labs  Lab 11/16/19 1135 11/19/19 1130 11/19/19 1236 11/19/19 1607 11/20/19 0253  WBC 6.3 13.3*   13.2*  --   --  9.6  LATICACIDVEN  --   --  2.2* 1.8  --    Microbiology Recent Results (from the past 240 hour(s))  SARS Coronavirus 2 by RT PCR (hospital order, performed in [facility] hospital lab) Nasopharyngeal Nasopharyngeal Swab     Status: Abnormal   Collection Time: 11/16/19  7:20 PM   Specimen: Nasopharyngeal Swab  Result Value Ref Range Status   SARS Coronavirus 2 POSITIVE (A) NEGATIVE Final    Comment: RESULT CALLED TO, READ BACK BY AND VERIFIED WITH: D,SIDBURY @2158  11/16/19 EB (NOTE) SARS-CoV-2 target nucleic acids are DETECTED  SARS-CoV-2 RNA is generally detectable in upper respiratory specimens  during the acute phase of infection.  Positive results are indicative  of the presence of the identified virus, but do not rule out bacterial infection or co-infection with other pathogens not detected by the test.  Clinical correlation with patient history and  other diagnostic information is necessary to determine patient infection status.  The expected result is negative.  Fact Sheet for Patients:   StrictlyIdeas.no   Fact Sheet for Healthcare Providers:   BankingDealers.co.za    This  test is not yet approved or cleared by the [place] FDA and  has been authorized for detection and/or diagnosis of SARS-CoV-2 by FDA under an Emergency Use Authorization (EUA).  This EUA will remain in effect (meaning this test can b e used) for the duration of  the COVID-19 declaration under Section 564(b)(1) of the Act, 21 U.S.C. section 360-bbb-3(b)(1), unless the authorization is terminated or revoked sooner.  Performed at [facility], [place] [address] [place], [place] [address]      Medications:    aspirin EC  81 mg Oral Daily   buPROPion  200 mg Oral QHS   Chlorhexidine Gluconate Cloth  6 each Topical Q0600   cholecalciferol  2,000 Units Oral q AM   feeding supplement (ENSURE ENLIVE)  237 mL Oral BID BM   fentaNYL       finasteride  5 mg Oral Daily   heparin injection (subcutaneous)  5,000 Units Subcutaneous Q12H   multivitamin with minerals  1 tablet Oral Daily   pantoprazole  40 mg Oral Daily   ranolazine  1,000 mg Oral BID   rosuvastatin  10 mg Oral Daily   vitamin B-12  1,000 mcg Oral Daily   Continuous Infusions:  cefTRIAXone (ROCEPHIN)  IV Stopped (11/20/19 0112)   lactated ringers 150 mL/hr at 11/20/19 0531      LOS: 1 day   Charlynne Cousins  [facility]  11/20/2019, 8:23 AM

## 2019-11-20 NOTE — Progress Notes (Signed)
Report received from previous RN. Agree with previous assessment. Will continue to monitor and chart focused assessment as needed.

## 2019-11-20 NOTE — Progress Notes (Signed)
°  Echocardiogram 2D Echocardiogram has been performed.  [name] [name] 11/20/2019, 2:18 PM

## 2019-11-20 NOTE — Progress Notes (Signed)
PHARMACY - PHYSICIAN COMMUNICATION CRITICAL VALUE ALERT - BLOOD CULTURE IDENTIFICATION (BCID)  [name] KISHI Sr. is an 79 y.o. male who presented to [facility] on 11/19/2019 with a chief complaint of confusion  Assessment:  Pt was admitted for obtructed urethral stone. He is s/p cystoscopy with right retrograde pyelogram and stent placement. Lab called with BCID result of 3/4 bottles with staph epi without resistance genes. MSSE.   Name of physician (or Provider) Contacted: Dr. Aileen Fass  Current antibiotics: Ceftriaxone  Changes to prescribed antibiotics recommended:  Change ceftriaxone to cefazolin 2g IV q8 based on protocol  Results for orders placed or performed during the hospital encounter of 11/19/19  Blood Culture ID Panel (Reflexed) (Collected: 11/19/2019 12:45 PM)  Result Value Ref Range   Enterococcus faecalis NOT DETECTED NOT DETECTED   Enterococcus Faecium NOT DETECTED NOT DETECTED   Listeria monocytogenes NOT DETECTED NOT DETECTED   Staphylococcus species DETECTED (A) NOT DETECTED   Staphylococcus aureus (BCID) NOT DETECTED NOT DETECTED   Staphylococcus epidermidis DETECTED (A) NOT DETECTED   Staphylococcus lugdunensis NOT DETECTED NOT DETECTED   Streptococcus species NOT DETECTED NOT DETECTED   Streptococcus agalactiae NOT DETECTED NOT DETECTED   Streptococcus pneumoniae NOT DETECTED NOT DETECTED   Streptococcus pyogenes NOT DETECTED NOT DETECTED   A.calcoaceticus-baumannii NOT DETECTED NOT DETECTED   Bacteroides fragilis NOT DETECTED NOT DETECTED   Enterobacterales NOT DETECTED NOT DETECTED   Enterobacter cloacae complex NOT DETECTED NOT DETECTED   Escherichia coli NOT DETECTED NOT DETECTED   Klebsiella aerogenes NOT DETECTED NOT DETECTED   Klebsiella oxytoca NOT DETECTED NOT DETECTED   Klebsiella pneumoniae NOT DETECTED NOT DETECTED   Proteus species NOT DETECTED NOT DETECTED   Salmonella species NOT DETECTED NOT DETECTED   Serratia marcescens NOT DETECTED NOT  DETECTED   Haemophilus influenzae NOT DETECTED NOT DETECTED   Neisseria meningitidis NOT DETECTED NOT DETECTED   Pseudomonas aeruginosa NOT DETECTED NOT DETECTED   Stenotrophomonas maltophilia NOT DETECTED NOT DETECTED   Candida albicans NOT DETECTED NOT DETECTED   Candida auris NOT DETECTED NOT DETECTED   Candida glabrata NOT DETECTED NOT DETECTED   Candida krusei NOT DETECTED NOT DETECTED   Candida parapsilosis NOT DETECTED NOT DETECTED   Candida tropicalis NOT DETECTED NOT DETECTED   Cryptococcus neoformans/gattii NOT DETECTED NOT DETECTED   Methicillin resistance mecA/C NOT DETECTED NOT DETECTED   Onnie Boer, PharmD, BCIDP, AAHIVP, CPP Infectious Disease Pharmacist 11/20/2019 3:16 PM

## 2019-11-21 LAB — BASIC METABOLIC PANEL
Anion gap: 5 (ref 5–15)
BUN: 19 mg/dL (ref 8–23)
CO2: 28 mmol/L (ref 22–32)
Calcium: 9.4 mg/dL (ref 8.9–10.3)
Chloride: 103 mmol/L (ref 98–111)
Creatinine, Ser: 0.83 mg/dL (ref 0.61–1.24)
GFR calc Af Amer: >60 mL/min (ref >60)
GFR calc non Af Amer: >60 mL/min (ref >60)
Glucose, Bld: 140 mg/dL — ABNORMAL HIGH (ref 70–99)
Potassium: 4 mmol/L (ref 3.5–5.1)
Sodium: 136 mmol/L (ref 135–145)

## 2019-11-21 LAB — URINE CULTURE: Culture: 80000 — AB

## 2019-11-21 LAB — GLUCOSE, CAPILLARY
Glucose-Capillary: 144 mg/dL — ABNORMAL HIGH (ref 70–99)
Glucose-Capillary: 147 mg/dL — ABNORMAL HIGH (ref 70–99)
Glucose-Capillary: 116 mg/dL — ABNORMAL HIGH (ref 70–99)
Glucose-Capillary: 95 mg/dL (ref 70–99)

## 2019-11-21 NOTE — Progress Notes (Signed)
[facility] PROGRESS NOTE    Progress Note  [name] [name]  ZYS:[id number] DOB: [birth year] DOA: 11/19/2019 PCP: [name], MD     Brief Narrative:   [name] Abu Sr. is an 79 y.o. male past medical history significant for esophageal stricture and achalasia status post dilation, CAD status post MI, chronic systolic heart failure presented with sudden onset of confusion, he was found to be febrile with a white count of 13, CT scan of the abdomen and pelvis showed a right obstructed 5 mm urethral stone.  Urology was consulted  Assessment/Plan:   Severe sepsis secondary to acute UTI in the setting of 5 mm nephrolithiasis: Antibiotic regimen deescalated to Ancef IV as blood culture reflex panel ID and urine culture grew there S. Epidermititis. He continues to remain afebrile with no leukocytosis. Urology was consulted and he status post cystoscopy with retrograde pyelogram and ureteral stent placement on 11/19/2019, they recommended to follow-up in the office. We will need to repeat blood cultures, has a left total knee replacement He will need at least 2 weeks of IV antibiotics.  COVID-19 infection: Patient currently has no respiratory symptoms using asymptomatic carrier as he has been vaccinated and has received Regeneron infusion. Sats stable no further work-up.  Dysphagia: Likely due to achalasia continue neck nitroglycerin will need to follow-up with GI as an outpatient.  Essential hypertension Resume metoprolol blood pressure is trending up, continue to hold Imdur.  AKI (acute kidney injury) due to postobstructive uropathy: Status post stent placement his creatinine has returned to normal.  Chronic systolic CHF (congestive heart failure) ([place]) According to admit her 2D echo with an EF of 50%, no wall motion abnormalities no vegetation seen's.  Will discuss with ID to see if a TEE is needed.  Normocytic anemia: We will need to follow-up with GI as an  outpatient.   DVT prophylaxis: lovenox Family Communication:none Status is: Inpatient  Remains inpatient appropriate because:Hemodynamically unstable   Dispo: The patient is from: Home              Anticipated d/c is to: Home              Anticipated d/c date is: 2 days              Patient currently is not medically stable to d/c.    Code Status:     Code Status Orders  (From admission, onward)         Start     Ordered   11/19/19 1807  Full code  Continuous        11/19/19 1808        Code Status History    Date Active Date Inactive Code Status Order ID Comments User Context   08/10/2018 1002 08/10/2018 1553 Full Code [id number]  Troy Sine, MD Inpatient   06/16/2015 1838 06/17/2015 1543 Full Code [id number]  Sid Falcon, MD Inpatient   02/02/2014 1450 02/03/2014 1351 Full Code [id number]  Jettie Booze, MD Inpatient   12/01/2013 0911 12/01/2013 1448 Full Code [id number]  Belva Crome, MD Inpatient   Advance Care Planning Activity        IV Access:    Peripheral IV   Procedures and diagnostic studies:   CT Abdomen Pelvis Wo Contrast  Result Date: 11/19/2019 CLINICAL DATA:  Body aches, chills, hypotensive, sepsis EXAM: CT ABDOMEN AND PELVIS WITHOUT CONTRAST TECHNIQUE: Multidetector CT imaging of the abdomen and pelvis was performed following the standard protocol  without IV contrast. COMPARISON:  08/17/2018 FINDINGS: Lower chest: No acute pleural or parenchymal lung disease. Hepatobiliary: No focal liver abnormality is seen. No gallstones, gallbladder wall thickening, or biliary dilatation. Pancreas: Unremarkable. No pancreatic ductal dilatation or surrounding inflammatory changes. Spleen: Normal in size without focal abnormality. Adrenals/Urinary Tract: There is an obstructing 5 mm mid right ureteral calculus reference image 55, with significant right-sided obstructive uropathy. Mild right perinephric and Peri ureteral fat stranding, concerning for  superimposed infection. No left-sided calculi or obstructive uropathy. There are bilateral renal cortical cysts unchanged. The adrenals are normal. The bladder is moderately distended without focal abnormality. Stomach/Bowel: No bowel obstruction or ileus. Moderate stool throughout the colon. Normal appendix right lower quadrant. No bowel wall thickening or inflammatory change. Vascular/Lymphatic: Aortic atherosclerosis. No enlarged abdominal or pelvic lymph nodes. Reproductive: Calcified prostatic concretions are noted. Otherwise the prostate is unremarkable. Other: No free fluid or free gas. No abdominal wall hernia. Penile prosthesis is identified. Musculoskeletal: No acute or destructive bony lesions. Reconstructed images demonstrate no additional findings. IMPRESSION: 1. Obstructing 5 mm mid right ureteral calculus, with right-sided obstructive uropathy. Right perinephric and Peri ureteral fat stranding may suggest superimposed infection. 2.  Aortic Atherosclerosis (ICD10-I70.0). Electronically Signed   By: Randa Ngo M.D.   On: 11/19/2019 15:28   DG Cystogram  Result Date: 11/19/2019 CLINICAL DATA:  Stent placement, right-sided obstructive uropathy EXAM: DG C-ARM 1-60 MIN; CYSTOGRAM - 3+ VIEW FLUOROSCOPY TIME:  Fluoroscopy Time:  11 seconds Radiation Exposure Index (if provided by the fluoroscopic device): 3.049 mGy Number of Acquired Spot Images: 16 COMPARISON:  11/19/2019 FINDINGS: 16 fluoroscopic images are obtained during the performance of the procedure and are provided for interpretation only. Cystoscope is identified, with cannulation and opacification of the right ureter. Tortuosity of the mid right ureter is noted. Subsequent placement of a right ureteral stent, proximal aspect coiled in the right renal pelvis and distal aspect coiled within the bladder. Please refer to the operative report. IMPRESSION: 1. Cystoscopy and right ureteral stent placement as above. Electronically Signed   By:  Randa Ngo M.D.   On: 11/19/2019 22:30   DG Chest Port 1 View  Result Date: 11/19/2019 CLINICAL DATA:  Question sepsis.  Altered mental status EXAM: PORTABLE CHEST 1 VIEW COMPARISON:  11/16/2019 FINDINGS: Cardiac and mediastinal contours normal. Vascularity normal. Atherosclerotic calcification aortic arch. Lungs are well aerated and clear.  No infiltrate or effusion. IMPRESSION: No active disease. Electronically Signed   By: Franchot Gallo M.D.   On: 11/19/2019 12:45   DG C-Arm 1-60 Min  Result Date: 11/19/2019 CLINICAL DATA:  Stent placement, right-sided obstructive uropathy EXAM: DG C-ARM 1-60 MIN; CYSTOGRAM - 3+ VIEW FLUOROSCOPY TIME:  Fluoroscopy Time:  11 seconds Radiation Exposure Index (if provided by the fluoroscopic device): 3.049 mGy Number of Acquired Spot Images: 16 COMPARISON:  11/19/2019 FINDINGS: 16 fluoroscopic images are obtained during the performance of the procedure and are provided for interpretation only. Cystoscope is identified, with cannulation and opacification of the right ureter. Tortuosity of the mid right ureter is noted. Subsequent placement of a right ureteral stent, proximal aspect coiled in the right renal pelvis and distal aspect coiled within the bladder. Please refer to the operative report. IMPRESSION: 1. Cystoscopy and right ureteral stent placement as above. Electronically Signed   By: Randa Ngo M.D.   On: 11/19/2019 22:30   ECHOCARDIOGRAM COMPLETE  Result Date: 11/20/2019    ECHOCARDIOGRAM REPORT   Patient Name:   [name] LESINSKI Sr.  Date of Exam: 11/20/2019 Medical Rec #:  [id number]        Height:       69.0 in Accession #:    [id number]       Weight:       190.8 lb Date of Birth:  1940-12-28        BSA:          2.025 m Patient Age:    43 years         BP:           148/69 mmHg Patient Gender: M                HR:           84 bpm. Exam Location:  Inpatient Procedure: 2D Echo Indications:    congestive heart failure 428.0  History:        Patient has no  prior history of Echocardiogram examinations.                 COPD and sepsis; Risk Factors:Current Smoker, Hypertension,                 Sleep Apnea and Dyslipidemia.  Sonographer:    Johny Chess Referring Phys: [id number] PING T ZHANG  Sonographer Comments: Technically difficult study due to poor echo windows. Image acquisition challenging due to respiratory motion and could not tolerate exam. IMPRESSIONS  1. Left ventricular ejection fraction, by estimation, is 50 to 55%. The left ventricle has low normal function. The left ventricle has no regional wall motion abnormalities. Left ventricular diastolic parameters are indeterminate.  2. Right ventricular systolic function is normal. The right ventricular size is normal.  3. The mitral valve is normal in structure. No evidence of mitral valve regurgitation. No evidence of mitral stenosis.  4. The aortic valve is tricuspid. Aortic valve regurgitation is not visualized. No aortic stenosis is present. FINDINGS  Left Ventricle: Left ventricular ejection fraction, by estimation, is 50 to 55%. The left ventricle has low normal function. The left ventricle has no regional wall motion abnormalities. The left ventricular internal cavity size was normal in size. There is no left ventricular hypertrophy. Left ventricular diastolic parameters are indeterminate. Right Ventricle: The right ventricular size is normal. No increase in right ventricular wall thickness. Right ventricular systolic function is normal. Left Atrium: Left atrial size was normal in size. Right Atrium: Right atrial size was normal in size. Pericardium: There is no evidence of pericardial effusion. Mitral Valve: The mitral valve is normal in structure. No evidence of mitral valve regurgitation. No evidence of mitral valve stenosis. Tricuspid Valve: The tricuspid valve is normal in structure. Tricuspid valve regurgitation is trivial. No evidence of tricuspid stenosis. Aortic Valve: The aortic valve is  tricuspid. Aortic valve regurgitation is not visualized. No aortic stenosis is present. Pulmonic Valve: The pulmonic valve was normal in structure. Pulmonic valve regurgitation is not visualized. No evidence of pulmonic stenosis. Aorta: The aortic root is normal in size and structure. IAS/Shunts: No atrial level shunt detected by color flow Doppler.  LEFT VENTRICLE PLAX 2D LVIDd:         4.80 cm LVIDs:         3.70 cm LV PW:         0.90 cm LV IVS:        1.10 cm LVOT diam:     2.10 cm LVOT Area:     3.46 cm  LEFT ATRIUM  Index LA diam:    4.10 cm 2.02 cm/m   AORTA Ao Root diam: 3.30 cm TRICUSPID VALVE TR Peak grad:   17.3 mmHg TR Vmax:        208.00 cm/s  SHUNTS Systemic Diam: 2.10 cm Skeet Latch MD Electronically signed by Skeet Latch MD Signature Date/Time: 11/20/2019/3:41:15 PM    Final      Medical Consultants:    None.  Anti-Infectives:   rocephin  Subjective:    [name] Abu Sr. feels great no new complaints today. Objective:    Vitals:   11/20/19 1957 11/21/19 0417 11/21/19 0446 11/21/19 0851  BP: 134/72  (!) 139/112 (!) 173/76  Pulse: 80  64 63  Resp:   18 20  Temp:   98.1 F (36.7 C) 98.9 F (37.2 C)  TempSrc:   Oral Oral  SpO2: 93%  95% 92%  Weight:  86.3 kg    Height:       SpO2: 92 % O2 Flow Rate (L/min): 2 L/min   Intake/Output Summary (Last 24 hours) at 11/21/2019 0856 Last data filed at 11/21/2019 0700 Gross per 24 hour  Intake 618.85 ml  Output 2350 ml  Net -1731.15 ml   Filed Weights   11/19/19 2036 11/20/19 0529 11/21/19 0417  Weight: 86.1 kg 86.5 kg 86.3 kg    Exam: General exam: In no acute distress. Respiratory system: Good air movement and clear to auscultation. Cardiovascular system: S1 & S2 heard, RRR. No JVD. Gastrointestinal system: Abdomen is nondistended, soft and nontender.  Extremities: No pedal edema. Skin: No rashes, lesions or ulcers   Data Reviewed:    Labs: Basic Metabolic Panel: Recent Labs  Lab  11/16/19 1135 11/16/19 1135 11/19/19 1130 11/19/19 1130 11/20/19 0253 11/21/19 0604  NA 136  --  132*  --  141 136  K 4.4   < > 3.9   < > 4.5 4.0  CL 104  --  96*  --  105 103  CO2 26  --  26  --  27 28  GLUCOSE 112*  --  103*  --  188* 140*  BUN 14  --  29*  --  22 19  CREATININE 0.75  --  1.61*  --  1.10 0.83  CALCIUM 9.2  --  9.1  --  9.2 9.4   < > = values in this interval not displayed.   GFR Estimated Creatinine Clearance: 77.2 mL/min (by C-G formula based on SCr of 0.83 mg/dL). Liver Function Tests: Recent Labs  Lab 11/19/19 1130  AST 19  ALT 16  ALKPHOS 62  BILITOT 1.1  PROT 6.2*  ALBUMIN 2.9*   No results for input(s): LIPASE, AMYLASE in the last 168 hours. No results for input(s): AMMONIA in the last 168 hours. Coagulation profile Recent Labs  Lab 11/19/19 1218  INR 1.2   COVID-19 Labs  No results for input(s): DDIMER, FERRITIN, LDH, CRP in the last 72 hours.  Lab Results  Component Value Date   SARSCOV2NAA POSITIVE (A) 11/16/2019   [place] NEGATIVE 05/28/2019   [place] NOT DETECTED 08/07/2018    CBC: Recent Labs  Lab 11/16/19 1135 11/19/19 1130 11/20/19 0253  WBC 6.3 13.3*  13.2* 9.6  NEUTROABS  --  12.2*  --   HGB 12.9* 12.9*  12.8* 11.9*  HCT 39.8 39.7  39.7 36.6*  MCV 102.6* 102.1*  101.8* 100.8*  PLT 200 163  150 126*   Cardiac Enzymes: No results for input(s): CKTOTAL, CKMB, CKMBINDEX,  TROPONINI in the last 168 hours. BNP (last 3 results) No results for input(s): PROBNP in the last 8760 hours. CBG: Recent Labs  Lab 11/19/19 2047 11/20/19 0625 11/20/19 1120 11/20/19 2156 11/21/19 0617  GLUCAP 109* 186* 194* 167* 144*   D-Dimer: No results for input(s): DDIMER in the last 72 hours. Hgb A1c: No results for input(s): HGBA1C in the last 72 hours. Lipid Profile: No results for input(s): CHOL, HDL, LDLCALC, TRIG, CHOLHDL, LDLDIRECT in the last 72 hours. Thyroid function studies: No results for input(s): TSH,  T4TOTAL, T3FREE, THYROIDAB in the last 72 hours.  Invalid input(s): FREET3 Anemia work up: No results for input(s): VITAMINB12, FOLATE, FERRITIN, TIBC, IRON, RETICCTPCT in the last 72 hours. Sepsis Labs: Recent Labs  Lab 11/16/19 1135 11/19/19 1130 11/19/19 1236 11/19/19 1607 11/20/19 0253  WBC 6.3 13.3*  13.2*  --   --  9.6  LATICACIDVEN  --   --  2.2* 1.8  --    Microbiology Recent Results (from the past 240 hour(s))  SARS Coronavirus 2 by RT PCR (hospital order, performed in [facility] hospital lab) Nasopharyngeal Nasopharyngeal Swab     Status: Abnormal   Collection Time: 11/16/19  7:20 PM   Specimen: Nasopharyngeal Swab  Result Value Ref Range Status   SARS Coronavirus 2 POSITIVE (A) NEGATIVE Final    Comment: RESULT CALLED TO, READ BACK BY AND VERIFIED WITH: D,SIDBURY @2158  11/16/19 EB (NOTE) SARS-CoV-2 target nucleic acids are DETECTED  SARS-CoV-2 RNA is generally detectable in upper respiratory specimens  during the acute phase of infection.  Positive results are indicative  of the presence of the identified virus, but do not rule out bacterial infection or co-infection with other pathogens not detected by the test.  Clinical correlation with patient history and  other diagnostic information is necessary to determine patient infection status.  The expected result is negative.  Fact Sheet for Patients:   StrictlyIdeas.no   Fact Sheet for Healthcare Providers:   BankingDealers.co.za    This test is not yet approved or cleared by the [place] FDA and  has been authorized for detection and/or diagnosis of SARS-CoV-2 by FDA under an Emergency Use Authorization (EUA).  This EUA will remain in effect (meaning this test can b e used) for the duration of  the COVID-19 declaration under Section 564(b)(1) of the Act, 21 U.S.C. section 360-bbb-3(b)(1), unless the authorization is terminated or revoked sooner.  Performed  at [facility], [place] [address] [place], [place] [address]   Urine culture     Status: Abnormal (Preliminary result)   Collection Time: 11/19/19 12:18 PM   Specimen: In/Out Cath Urine  Result Value Ref Range Status   Specimen Description IN/OUT CATH URINE  Final   Special Requests NONE  Final   Culture (A)  Final    80,000 COLONIES/mL STAPHYLOCOCCUS EPIDERMIDIS SUSCEPTIBILITIES TO FOLLOW Performed at [facility], [place] [address], [place], [place] [address]    Report Status PENDING  Incomplete  Blood Culture (routine x 2)     Status: None (Preliminary result)   Collection Time: 11/19/19 12:28 PM   Specimen: BLOOD  Result Value Ref Range Status   Specimen Description BLOOD RIGHT ANTECUBITAL  Final   Special Requests   Final    BOTTLES DRAWN AEROBIC AND ANAEROBIC Blood Culture adequate volume   Culture  Setup Time   Final    GRAM POSITIVE COCCI IN BOTH AEROBIC AND ANAEROBIC BOTTLES CRITICAL VALUE NOTED.  VALUE IS  CONSISTENT WITH PREVIOUSLY REPORTED AND CALLED VALUE. Performed at [facility], [place] [address] [place], [place] [address]    Culture GRAM POSITIVE COCCI  Final   Report Status PENDING  Incomplete  Blood Culture (routine x 2)     Status: None (Preliminary result)   Collection Time: 11/19/19 12:45 PM   Specimen: BLOOD  Result Value Ref Range Status   Specimen Description BLOOD LEFT ANTECUBITAL  Final   Special Requests   Final    BOTTLES DRAWN AEROBIC AND ANAEROBIC Blood Culture adequate volume   Culture  Setup Time   Final    GRAM POSITIVE COCCI IN CLUSTERS IN BOTH AEROBIC AND ANAEROBIC BOTTLES Organism ID to follow CRITICAL RESULT CALLED TO, READ BACK BY AND VERIFIED WITH: M. PHAM PHARMD, AT 1506 11/20/19 BY DV Performed at [facility], [place] [address] [place], [place] [address]    Culture GRAM POSITIVE COCCI  Final   Report Status PENDING  Incomplete  Blood Culture ID Panel (Reflexed)     Status: Abnormal   Collection Time: 11/19/19  12:45 PM  Result Value Ref Range Status   Enterococcus faecalis NOT DETECTED NOT DETECTED Final   Enterococcus Faecium NOT DETECTED NOT DETECTED Final   Listeria monocytogenes NOT DETECTED NOT DETECTED Final   Staphylococcus species DETECTED (A) NOT DETECTED Final    Comment: CRITICAL RESULT CALLED TO, READ BACK BY AND VERIFIED WITH: M. PHAM PHARMD, AT 1506 11/20/19 BY DV    Staphylococcus aureus (BCID) NOT DETECTED NOT DETECTED Final   Staphylococcus epidermidis DETECTED (A) NOT DETECTED Final    Comment: CRITICAL RESULT CALLED TO, READ BACK BY AND VERIFIED WITH: M. PHAM PHARMD, AT 1506 11/20/19 BY DV    Staphylococcus lugdunensis NOT DETECTED NOT DETECTED Final   Streptococcus species NOT DETECTED NOT DETECTED Final   Streptococcus agalactiae NOT DETECTED NOT DETECTED Final   Streptococcus pneumoniae NOT DETECTED NOT DETECTED Final   Streptococcus pyogenes NOT DETECTED NOT DETECTED Final   A.calcoaceticus-baumannii NOT DETECTED NOT DETECTED Final   Bacteroides fragilis NOT DETECTED NOT DETECTED Final   Enterobacterales NOT DETECTED NOT DETECTED Final   Enterobacter cloacae complex NOT DETECTED NOT DETECTED Final   Escherichia coli NOT DETECTED NOT DETECTED Final   Klebsiella aerogenes NOT DETECTED NOT DETECTED Final   Klebsiella oxytoca NOT DETECTED NOT DETECTED Final   Klebsiella pneumoniae NOT DETECTED NOT DETECTED Final   Proteus species NOT DETECTED NOT DETECTED Final   Salmonella species NOT DETECTED NOT DETECTED Final   Serratia marcescens NOT DETECTED NOT DETECTED Final   Haemophilus influenzae NOT DETECTED NOT DETECTED Final   Neisseria meningitidis NOT DETECTED NOT DETECTED Final   Pseudomonas aeruginosa NOT DETECTED NOT DETECTED Final   Stenotrophomonas maltophilia NOT DETECTED NOT DETECTED Final   Candida albicans NOT DETECTED NOT DETECTED Final   Candida auris NOT DETECTED NOT DETECTED Final   Candida glabrata NOT DETECTED NOT DETECTED Final   Candida krusei NOT  DETECTED NOT DETECTED Final   Candida parapsilosis NOT DETECTED NOT DETECTED Final   Candida tropicalis NOT DETECTED NOT DETECTED Final   Cryptococcus neoformans/gattii NOT DETECTED NOT DETECTED Final   Methicillin resistance mecA/C NOT DETECTED NOT DETECTED Final    Comment: Performed at [facility], [address], [place], [place] [address]     Medications:   . aspirin EC  81 mg Oral Daily  . buPROPion  200 mg Oral QHS  . Chlorhexidine Gluconate Cloth  6 each Topical Q0600  .  cholecalciferol  2,000 Units Oral q AM  . feeding supplement (ENSURE ENLIVE)  237 mL Oral BID BM  . finasteride  5 mg Oral Daily  . heparin injection (subcutaneous)  5,000 Units Subcutaneous Q12H  . metoprolol tartrate  12.5 mg Oral BID  . multivitamin with minerals  1 tablet Oral Daily  . pantoprazole  40 mg Oral Daily  . ranolazine  1,000 mg Oral BID  . rosuvastatin  10 mg Oral Daily  . tamsulosin  0.4 mg Oral QHS  . vitamin B-12  1,000 mcg Oral Daily   Continuous Infusions: .  ceFAZolin (ANCEF) IV 2 g (11/21/19 0300)      LOS: 2 days   Charlynne Cousins  [facility]  11/21/2019, 8:56 AM

## 2019-11-22 DIAGNOSIS — R7881 Bacteremia: Secondary | ICD-10-CM

## 2019-11-22 DIAGNOSIS — N200 Calculus of kidney: Secondary | ICD-10-CM

## 2019-11-22 DIAGNOSIS — B957 Other staphylococcus as the cause of diseases classified elsewhere: Secondary | ICD-10-CM

## 2019-11-22 LAB — CULTURE, BLOOD (ROUTINE X 2)
Special Requests: ADEQUATE
Special Requests: ADEQUATE

## 2019-11-22 LAB — GLUCOSE, CAPILLARY
Glucose-Capillary: 218 mg/dL — ABNORMAL HIGH (ref 70–99)
Glucose-Capillary: 87 mg/dL (ref 70–99)
Glucose-Capillary: 92 mg/dL (ref 70–99)
Glucose-Capillary: 104 mg/dL — ABNORMAL HIGH (ref 70–99)
Glucose-Capillary: 95 mg/dL (ref 70–99)

## 2019-11-22 LAB — BASIC METABOLIC PANEL
Anion gap: 8 (ref 5–15)
BUN: 16 mg/dL (ref 8–23)
CO2: 30 mmol/L (ref 22–32)
Calcium: 9.3 mg/dL (ref 8.9–10.3)
Chloride: 98 mmol/L (ref 98–111)
Creatinine, Ser: 0.81 mg/dL (ref 0.61–1.24)
GFR calc Af Amer: >60 mL/min (ref >60)
GFR calc non Af Amer: >60 mL/min (ref >60)
Glucose, Bld: 88 mg/dL (ref 70–99)
Potassium: 3.8 mmol/L (ref 3.5–5.1)
Sodium: 136 mmol/L (ref 135–145)

## 2019-11-22 MED ORDER — POLYETHYLENE GLYCOL 3350 17 G PO PACK: 17.0000 g | PACK | Freq: Every day | ORAL | Status: DC | PRN

## 2019-11-22 NOTE — Progress Notes (Signed)
Patient is hallucinating black ants. MD notified. Delirium precautions initiated.

## 2019-11-22 NOTE — Progress Notes (Signed)
[facility] PROGRESS NOTE    Progress Note  [name] [name]  GLO:[id number] DOB: 1940-05-11 DOA: 11/19/2019 PCP: [name], MD     Brief Narrative:   [name] Abu Sr. is an 79 y.o. male past medical history significant for esophageal stricture and achalasia status post dilation, CAD status post MI, chronic systolic heart failure presented with sudden onset of confusion, he was found to be febrile with a white count of 13, CT scan of the abdomen and pelvis showed a right obstructed 5 mm urethral stone.  Urology was consulted  Assessment/Plan:   Severe sepsis secondary to acute UTI in the setting of 5 mm nephrolithiasis: Currently on IV Ancef, urine culture and blood culture grew staph epi, surveillance blood cultures remain negative till date, will continue IV antibiotics for an additional 24 hours and can change to oral cefazolin as an outpatient for 2 weeks if his blood cultures remain negative. He continues to be afebrile with no leukocytosis. Already performed cystoscopy with retrograde pyelogram and ureteral stent placement on 11/19/2019 there follow-up as an outpatient. He has a right total knee replacement no signs of infection. He will need at least 2 weeks of IV antibiotics.  COVID-19 infection: Patient currently has no respiratory symptoms using asymptomatic carrier as he has been vaccinated and has received Regeneron infusion. Sats stable no further work-up.  Dysphagia: Likely due to achalasia continue neck nitroglycerin will need to follow-up with GI as an outpatient.  Essential hypertension Resume metoprolol blood pressure is trending up, continue to hold Imdur.  AKI (acute kidney injury) due to postobstructive uropathy: Status post stent placement his creatinine has returned to normal.  Chronic systolic CHF (congestive heart failure) ([place]) According to admit her 2D echo with an EF of 50%, no wall motion abnormalities no vegetation seen's.  Will discuss with  ID to see if a TEE is needed.  Normocytic anemia: We will need to follow-up with GI as an outpatient.   DVT prophylaxis: lovenox Family Communication:none Status is: Inpatient  Remains inpatient appropriate because:Hemodynamically unstable   Dispo: The patient is from: Home              Anticipated d/c is to: Home              Anticipated d/c date is: 2 days              Patient currently is not medically stable to d/c.    Code Status:     Code Status Orders  (From admission, onward)         Start     Ordered   11/19/19 1807  Full code  Continuous        11/19/19 1808        Code Status History    Date Active Date Inactive Code Status Order ID Comments User Context   08/10/2018 1002 08/10/2018 1553 Full Code [id number]  Troy Sine, MD Inpatient   06/16/2015 1838 06/17/2015 1543 Full Code [id number]  Sid Falcon, MD Inpatient   02/02/2014 1450 02/03/2014 1351 Full Code [id number]  Jettie Booze, MD Inpatient   12/01/2013 0911 12/01/2013 1448 Full Code [id number]  Belva Crome, MD Inpatient   Advance Care Planning Activity        IV Access:    Peripheral IV   Procedures and diagnostic studies:   ECHOCARDIOGRAM COMPLETE  Result Date: 11/20/2019    ECHOCARDIOGRAM REPORT   Patient Name:   [name] MADAY Sr.  Date of Exam: 11/20/2019 Medical Rec #:  [id number]        Height:       69.0 in Accession #:    [id number]       Weight:       190.8 lb Date of Birth:  [birth year]        BSA:          2.025 m Patient Age:    29 years         BP:           148/69 mmHg Patient Gender: M                HR:           84 bpm. Exam Location:  Inpatient Procedure: 2D Echo Indications:    congestive heart failure 428.0  History:        Patient has no prior history of Echocardiogram examinations.                 COPD and sepsis; Risk Factors:Current Smoker, Hypertension,                 Sleep Apnea and Dyslipidemia.  Sonographer:    Johny Chess Referring Phys: [id number] PING T  ZHANG  Sonographer Comments: Technically difficult study due to poor echo windows. Image acquisition challenging due to respiratory motion and could not tolerate exam. IMPRESSIONS  1. Left ventricular ejection fraction, by estimation, is 50 to 55%. The left ventricle has low normal function. The left ventricle has no regional wall motion abnormalities. Left ventricular diastolic parameters are indeterminate.  2. Right ventricular systolic function is normal. The right ventricular size is normal.  3. The mitral valve is normal in structure. No evidence of mitral valve regurgitation. No evidence of mitral stenosis.  4. The aortic valve is tricuspid. Aortic valve regurgitation is not visualized. No aortic stenosis is present. FINDINGS  Left Ventricle: Left ventricular ejection fraction, by estimation, is 50 to 55%. The left ventricle has low normal function. The left ventricle has no regional wall motion abnormalities. The left ventricular internal cavity size was normal in size. There is no left ventricular hypertrophy. Left ventricular diastolic parameters are indeterminate. Right Ventricle: The right ventricular size is normal. No increase in right ventricular wall thickness. Right ventricular systolic function is normal. Left Atrium: Left atrial size was normal in size. Right Atrium: Right atrial size was normal in size. Pericardium: There is no evidence of pericardial effusion. Mitral Valve: The mitral valve is normal in structure. No evidence of mitral valve regurgitation. No evidence of mitral valve stenosis. Tricuspid Valve: The tricuspid valve is normal in structure. Tricuspid valve regurgitation is trivial. No evidence of tricuspid stenosis. Aortic Valve: The aortic valve is tricuspid. Aortic valve regurgitation is not visualized. No aortic stenosis is present. Pulmonic Valve: The pulmonic valve was normal in structure. Pulmonic valve regurgitation is not visualized. No evidence of pulmonic stenosis. Aorta:  The aortic root is normal in size and structure. IAS/Shunts: No atrial level shunt detected by color flow Doppler.  LEFT VENTRICLE PLAX 2D LVIDd:         4.80 cm LVIDs:         3.70 cm LV PW:         0.90 cm LV IVS:        1.10 cm LVOT diam:     2.10 cm LVOT Area:     3.46 cm  LEFT ATRIUM  Index LA diam:    4.10 cm 2.02 cm/m   AORTA Ao Root diam: 3.30 cm TRICUSPID VALVE TR Peak grad:   17.3 mmHg TR Vmax:        208.00 cm/s  SHUNTS Systemic Diam: 2.10 cm Skeet Latch MD Electronically signed by Skeet Latch MD Signature Date/Time: 11/20/2019/3:41:15 PM    Final      Medical Consultants:    None.  Anti-Infectives:   IV Ancef  Subjective:    [name] Abu Sr. feels great today wants to go home. Objective:    Vitals:   11/22/19 0015 11/22/19 0058 11/22/19 0443 11/22/19 0756  BP:  (!) 153/69 (!) 156/74 112/63  Pulse:  (!) 57 (!) 58 (!) 58  Resp:  16 (!) 21 20  Temp:  97.9 F (36.6 C) 97.6 F (36.4 C) 97.6 F (36.4 C)  TempSrc:  Oral Oral Oral  SpO2:  94% 95% 96%  Weight: 84.6 kg     Height:       SpO2: 96 % O2 Flow Rate (L/min): 2 L/min   Intake/Output Summary (Last 24 hours) at 11/22/2019 0820 Last data filed at 11/22/2019 0809 Gross per 24 hour  Intake 2216.21 ml  Output 2400 ml  Net -183.79 ml   Filed Weights   11/20/19 0529 11/21/19 0417 11/22/19 0015  Weight: 86.5 kg 86.3 kg 84.6 kg    Exam: General exam: In no acute distress. Respiratory system: Good air movement and clear to auscultation. Cardiovascular system: S1 & S2 heard, RRR. No JVD. Gastrointestinal system: Abdomen is nondistended, soft and nontender.  Extremities: No pedal edema. Skin: No rashes, lesions or ulcers Psychiatry: Judgement and insight appear normal. Mood & affect appropriate.   Data Reviewed:    Labs: Basic Metabolic Panel: Recent Labs  Lab 11/16/19 1135 11/16/19 1135 11/19/19 1130 11/19/19 1130 11/20/19 0253 11/20/19 0253 11/21/19 0604 11/22/19 0439  NA 136   --  132*  --  141  --  136 136  K 4.4   < > 3.9   < > 4.5   < > 4.0 3.8  CL 104  --  96*  --  105  --  103 98  CO2 26  --  26  --  27  --  28 30  GLUCOSE 112*  --  103*  --  188*  --  140* 88  BUN 14  --  29*  --  22  --  19 16  CREATININE 0.75  --  1.61*  --  1.10  --  0.83 0.81  CALCIUM 9.2  --  9.1  --  9.2  --  9.4 9.3   < > = values in this interval not displayed.   GFR Estimated Creatinine Clearance: 72.7 mL/min (by C-G formula based on SCr of 0.81 mg/dL). Liver Function Tests: Recent Labs  Lab 11/19/19 1130  AST 19  ALT 16  ALKPHOS 62  BILITOT 1.1  PROT 6.2*  ALBUMIN 2.9*   No results for input(s): LIPASE, AMYLASE in the last 168 hours. No results for input(s): AMMONIA in the last 168 hours. Coagulation profile Recent Labs  Lab 11/19/19 1218  INR 1.2   COVID-19 Labs  No results for input(s): DDIMER, FERRITIN, LDH, CRP in the last 72 hours.  Lab Results  Component Value Date   [place] (A) 11/16/2019   [place] NEGATIVE 05/28/2019   [place] NOT DETECTED 08/07/2018    CBC: Recent Labs  Lab 11/16/19 1135 11/19/19 1130 11/20/19  0253  WBC 6.3 13.3*  13.2* 9.6  NEUTROABS  --  12.2*  --   HGB 12.9* 12.9*  12.8* 11.9*  HCT 39.8 39.7  39.7 36.6*  MCV 102.6* 102.1*  101.8* 100.8*  PLT 200 163  150 126*   Cardiac Enzymes: No results for input(s): CKTOTAL, CKMB, CKMBINDEX, TROPONINI in the last 168 hours. BNP (last 3 results) No results for input(s): PROBNP in the last 8760 hours. CBG: Recent Labs  Lab 11/21/19 0617 11/21/19 1127 11/21/19 1732 11/21/19 2110 11/22/19 0602  GLUCAP 144* 147* 116* 95 87   D-Dimer: No results for input(s): DDIMER in the last 72 hours. Hgb A1c: No results for input(s): HGBA1C in the last 72 hours. Lipid Profile: No results for input(s): CHOL, HDL, LDLCALC, TRIG, CHOLHDL, LDLDIRECT in the last 72 hours. Thyroid function studies: No results for input(s): TSH, T4TOTAL, T3FREE, THYROIDAB in the  last 72 hours.  Invalid input(s): FREET3 Anemia work up: No results for input(s): VITAMINB12, FOLATE, FERRITIN, TIBC, IRON, RETICCTPCT in the last 72 hours. Sepsis Labs: Recent Labs  Lab 11/16/19 1135 11/19/19 1130 11/19/19 1236 11/19/19 1607 11/20/19 0253  WBC 6.3 13.3*  13.2*  --   --  9.6  LATICACIDVEN  --   --  2.2* 1.8  --    Microbiology Recent Results (from the past 240 hour(s))  SARS Coronavirus 2 by RT PCR (hospital order, performed in [facility] hospital lab) Nasopharyngeal Nasopharyngeal Swab     Status: Abnormal   Collection Time: 11/16/19  7:20 PM   Specimen: Nasopharyngeal Swab  Result Value Ref Range Status   SARS Coronavirus 2 POSITIVE (A) NEGATIVE Final    Comment: RESULT CALLED TO, READ BACK BY AND VERIFIED WITH: D,SIDBURY @2158  11/16/19 EB (NOTE) SARS-CoV-2 target nucleic acids are DETECTED  SARS-CoV-2 RNA is generally detectable in upper respiratory specimens  during the acute phase of infection.  Positive results are indicative  of the presence of the identified virus, but do not rule out bacterial infection or co-infection with other pathogens not detected by the test.  Clinical correlation with patient history and  other diagnostic information is necessary to determine patient infection status.  The expected result is negative.  Fact Sheet for Patients:   StrictlyIdeas.no   Fact Sheet for Healthcare Providers:   BankingDealers.co.za    This test is not yet approved or cleared by the [place] FDA and  has been authorized for detection and/or diagnosis of SARS-CoV-2 by FDA under an Emergency Use Authorization (EUA).  This EUA will remain in effect (meaning this test can b e used) for the duration of  the COVID-19 declaration under Section 564(b)(1) of the Act, 21 U.S.C. section 360-bbb-3(b)(1), unless the authorization is terminated or revoked sooner.  Performed at [facility], [place] [address], [place], [place] [address]   Urine culture     Status: Abnormal   Collection Time: 11/19/19 12:18 PM   Specimen: In/Out Cath Urine  Result Value Ref Range Status   Specimen Description IN/OUT CATH URINE  Final   Special Requests   Final    NONE Performed at [facility], [place] [address] [place], [place] [address]    Culture 80,000 COLONIES/mL STAPHYLOCOCCUS EPIDERMIDIS (A)  Final   Report Status 11/21/2019 FINAL  Final   Organism ID, Bacteria STAPHYLOCOCCUS EPIDERMIDIS (A)  Final      Susceptibility   Staphylococcus epidermidis - MIC*    CIPROFLOXACIN >=8 RESISTANT Resistant  GENTAMICIN <=0.5 SENSITIVE Sensitive     NITROFURANTOIN <=16 SENSITIVE Sensitive     OXACILLIN <=0.25 SENSITIVE Sensitive     TETRACYCLINE 2 SENSITIVE Sensitive     VANCOMYCIN 1 SENSITIVE Sensitive     TRIMETH/SULFA <=10 SENSITIVE Sensitive     CLINDAMYCIN <=0.25 SENSITIVE Sensitive     RIFAMPIN <=0.5 SENSITIVE Sensitive     Inducible Clindamycin NEGATIVE Sensitive     * 80,000 COLONIES/mL STAPHYLOCOCCUS EPIDERMIDIS  Blood Culture (routine x 2)     Status: Abnormal   Collection Time: 11/19/19 12:28 PM   Specimen: BLOOD  Result Value Ref Range Status   Specimen Description BLOOD RIGHT ANTECUBITAL  Final   Special Requests   Final    BOTTLES DRAWN AEROBIC AND ANAEROBIC Blood Culture adequate volume   Culture  Setup Time   Final    GRAM POSITIVE COCCI IN BOTH AEROBIC AND ANAEROBIC BOTTLES CRITICAL VALUE NOTED.  VALUE IS CONSISTENT WITH PREVIOUSLY REPORTED AND CALLED VALUE.    Culture (A)  Final    STAPHYLOCOCCUS EPIDERMIDIS SUSCEPTIBILITIES PERFORMED ON PREVIOUS CULTURE WITHIN THE LAST 5 DAYS. Performed at [facility], [place] [address] [place], [place] [address]    Report Status 11/22/2019 FINAL  Final  Blood Culture (routine x 2)     Status: Abnormal   Collection Time: 11/19/19 12:45 PM   Specimen: BLOOD  Result Value Ref Range Status   Specimen Description BLOOD LEFT  ANTECUBITAL  Final   Special Requests   Final    BOTTLES DRAWN AEROBIC AND ANAEROBIC Blood Culture adequate volume   Culture  Setup Time   Final    GRAM POSITIVE COCCI IN CLUSTERS IN BOTH AEROBIC AND ANAEROBIC BOTTLES CRITICAL RESULT CALLED TO, READ BACK BY AND VERIFIED WITH: M. PHAM PHARMD, AT 1506 11/20/19 BY DV Performed at [facility], [place] [address] [place], [place] [address]    Culture STAPHYLOCOCCUS EPIDERMIDIS (A)  Final   Report Status 11/22/2019 FINAL  Final   Organism ID, Bacteria STAPHYLOCOCCUS EPIDERMIDIS  Final      Susceptibility   Staphylococcus epidermidis - MIC*    CIPROFLOXACIN >=8 RESISTANT Resistant     ERYTHROMYCIN <=0.25 SENSITIVE Sensitive     GENTAMICIN <=0.5 SENSITIVE Sensitive     OXACILLIN <=0.25 SENSITIVE Sensitive     TETRACYCLINE 2 SENSITIVE Sensitive     VANCOMYCIN 1 SENSITIVE Sensitive     TRIMETH/SULFA <=10 SENSITIVE Sensitive     CLINDAMYCIN <=0.25 SENSITIVE Sensitive     RIFAMPIN <=0.5 SENSITIVE Sensitive     Inducible Clindamycin NEGATIVE Sensitive     * STAPHYLOCOCCUS EPIDERMIDIS  Blood Culture ID Panel (Reflexed)     Status: Abnormal   Collection Time: 11/19/19 12:45 PM  Result Value Ref Range Status   Enterococcus faecalis NOT DETECTED NOT DETECTED Final   Enterococcus Faecium NOT DETECTED NOT DETECTED Final   Listeria monocytogenes NOT DETECTED NOT DETECTED Final   Staphylococcus species DETECTED (A) NOT DETECTED Final    Comment: CRITICAL RESULT CALLED TO, READ BACK BY AND VERIFIED WITH: M. PHAM PHARMD, AT 1506 11/20/19 BY DV    Staphylococcus aureus (BCID) NOT DETECTED NOT DETECTED Final   Staphylococcus epidermidis DETECTED (A) NOT DETECTED Final    Comment: CRITICAL RESULT CALLED TO, READ BACK BY AND VERIFIED WITH: M. PHAM PHARMD, AT 1506 11/20/19 BY DV    Staphylococcus lugdunensis NOT DETECTED NOT DETECTED Final   Streptococcus species NOT DETECTED NOT DETECTED Final   Streptococcus agalactiae NOT DETECTED NOT DETECTED  Final   Streptococcus pneumoniae NOT DETECTED NOT DETECTED Final   Streptococcus pyogenes NOT DETECTED NOT DETECTED Final   A.calcoaceticus-baumannii NOT DETECTED NOT DETECTED Final   Bacteroides fragilis NOT DETECTED NOT DETECTED Final   Enterobacterales NOT DETECTED NOT DETECTED Final   Enterobacter cloacae complex NOT DETECTED NOT DETECTED Final   Escherichia coli NOT DETECTED NOT DETECTED Final   Klebsiella aerogenes NOT DETECTED NOT DETECTED Final   Klebsiella oxytoca NOT DETECTED NOT DETECTED Final   Klebsiella pneumoniae NOT DETECTED NOT DETECTED Final   Proteus species NOT DETECTED NOT DETECTED Final   Salmonella species NOT DETECTED NOT DETECTED Final   Serratia marcescens NOT DETECTED NOT DETECTED Final   Haemophilus influenzae NOT DETECTED NOT DETECTED Final   Neisseria meningitidis NOT DETECTED NOT DETECTED Final   Pseudomonas aeruginosa NOT DETECTED NOT DETECTED Final   Stenotrophomonas maltophilia NOT DETECTED NOT DETECTED Final   Candida albicans NOT DETECTED NOT DETECTED Final   Candida auris NOT DETECTED NOT DETECTED Final   Candida glabrata NOT DETECTED NOT DETECTED Final   Candida krusei NOT DETECTED NOT DETECTED Final   Candida parapsilosis NOT DETECTED NOT DETECTED Final   Candida tropicalis NOT DETECTED NOT DETECTED Final   Cryptococcus neoformans/gattii NOT DETECTED NOT DETECTED Final   Methicillin resistance mecA/C NOT DETECTED NOT DETECTED Final    Comment: Performed at [facility], [address], [place], [place] [address]  Culture, blood (Routine X 2) w Reflex to ID Panel     Status: None (Preliminary result)   Collection Time: 11/21/19  3:42 PM   Specimen: BLOOD LEFT ARM  Result Value Ref Range Status   Specimen Description BLOOD LEFT ARM  Final   Special Requests   Final    BOTTLES DRAWN AEROBIC ONLY Blood Culture adequate volume   Culture   Final    NO GROWTH < 12 HOURS Performed at [facility], [place] [address], [place], [place]  [address]    Report Status PENDING  Incomplete  Culture, blood (Routine X 2) w Reflex to ID Panel     Status: None (Preliminary result)   Collection Time: 11/21/19  5:36 PM   Specimen: BLOOD LEFT ARM  Result Value Ref Range Status   Specimen Description BLOOD LEFT ARM  Final   Special Requests   Final    BOTTLES DRAWN AEROBIC ONLY Blood Culture adequate volume   Culture   Final    NO GROWTH < 12 HOURS Performed at [facility], [place] [address], [place], [place] [address]    Report Status PENDING  Incomplete     Medications:   . aspirin EC  81 mg Oral Daily  . buPROPion  200 mg Oral QHS  . cholecalciferol  2,000 Units Oral q AM  . feeding supplement (ENSURE ENLIVE)  237 mL Oral BID BM  . finasteride  5 mg Oral Daily  . heparin injection (subcutaneous)  5,000 Units Subcutaneous Q12H  . metoprolol tartrate  12.5 mg Oral BID  . multivitamin with minerals  1 tablet Oral Daily  . pantoprazole  40 mg Oral Daily  . ranolazine  1,000 mg Oral BID  . rosuvastatin  10 mg Oral Daily  . tamsulosin  0.4 mg Oral QHS  . vitamin B-12  1,000 mcg Oral Daily   Continuous Infusions: .  ceFAZolin (ANCEF) IV 2 g (11/22/19 0103)      LOS: 3 days   Charlynne Cousins  [facility]  11/22/2019, 8:20  AM

## 2019-11-23 LAB — BASIC METABOLIC PANEL
Anion gap: 9 (ref 5–15)
BUN: 18 mg/dL (ref 8–23)
CO2: 29 mmol/L (ref 22–32)
Calcium: 9.3 mg/dL (ref 8.9–10.3)
Chloride: 98 mmol/L (ref 98–111)
Creatinine, Ser: 0.86 mg/dL (ref 0.61–1.24)
GFR calc Af Amer: >60 mL/min (ref >60)
GFR calc non Af Amer: >60 mL/min (ref >60)
Glucose, Bld: 87 mg/dL (ref 70–99)
Potassium: 4 mmol/L (ref 3.5–5.1)
Sodium: 136 mmol/L (ref 135–145)

## 2019-11-23 LAB — GLUCOSE, CAPILLARY: Glucose-Capillary: 95 mg/dL (ref 70–99)

## 2019-11-23 MED ORDER — CEPHALEXIN 500 MG PO CAPS: 500.0000 mg | ORAL_CAPSULE | Freq: Four times a day (QID) | ORAL | 0 refills | Status: AC

## 2019-11-23 NOTE — Discharge Summary (Signed)
Physician Discharge Summary  OLAWALE MARNEY Sr. IPJ:[id number] DOB: 10-May-1940 DOA: 11/19/2019  PCP: [name], MD  Admit date: 11/19/2019 Discharge date: 11/23/2019  Admitted From: Home Disposition:  Home  Recommendations for Outpatient Follow-up:  1. Follow up with PCP in 1-2 weeks 2. Please obtain BMP/CBC in one week 3.   Home Health:No Equipment/Devices:None  Discharge Condition:Stable CODE STATUS:Full Diet recommendation: Heart Healthy  Brief/Interim Summary: 79 y.o. male past medical history significant for esophageal stricture and achalasia status post dilation, CAD status post MI, chronic systolic heart failure presented with sudden onset of confusion, he was found to be febrile with a white count of 13, CT scan of the abdomen and pelvis showed a right obstructed 5 mm urethral stone.   Discharge Diagnoses:  Active Problems:   Essential hypertension   Severe sepsis (HCC)   Acute UTI   AKI (acute kidney injury) ([facility])   Achalasia of esophagus   Chronic systolic CHF (congestive heart failure) (HCC)   Normocytic anemia   Obstructed, uropathy   Staphylococcus epidermidis bacteremia   Right nephrolithiasis Severe sepsis secondary to acute UTI in the setting of 5 mm nephrolithiasis: He was admitted to the hospital started on IV Rocephin urine cultures and blood cultures were drawn grew staph epi, surveillance blood cultures turn negative urology was consulted who performed cystoscopy with retrograde pyelogram and ureteral stenting on 11/19/2019 they will need to follow-up as an outpatient. His surveillance blood cultures became negative and he will continue oral Keflex for 2 weeks as an outpatient.  COVID-19 infection: Patient is currently asymptomatic he received Regeneron as an outpatient and has had a vaccine no further work-up is needed.  Dysphagia: Likely due to achalasia continue nitroglycerin as needed will need to follow-up with GI as an outpatient.  Essential  hypertension: Continue metoprolol no changes made is on medication.  Acute kidney injury: Likely due to postobstructive uropathy status post stenting creatinine has improved after stenting.  Chronic systolic heart failure: No changes made to his medication.  Normocytic anemia: Need to follow-up with GI as an outpatient.   Discharge Instructions  Discharge Instructions    Diet - low sodium heart healthy   Complete by: As directed    Increase activity slowly   Complete by: As directed    No wound care   Complete by: As directed      Allergies as of 11/23/2019      Reactions   Doxycycline Other (See Comments)   Dizziness   Flomax [tamsulosin] Other (See Comments)   Dizziness   Lipitor [atorvastatin] Other (See Comments)   Muscle weakness   Rhubarb Other (See Comments)   From childhood- exact reaction not recalled, but is allergic   Sulfa Antibiotics Hives   Sulfacetamide Sodium Hives   Clindamycin/lincomycin Rash   Other Hives, Itching, Rash, Other (See Comments)   UNNAMED ANTIBIOTIC- SEVERE SYMPTOMS [place]- From childhood- exact reaction not recalled, but is allergic   Vytorin [ezetimibe-simvastatin] Other (See Comments)   Bodyaches      Medication List    TAKE these medications   aspirin EC 81 MG tablet Take 1 tablet (81 mg total) by mouth daily. Resume after 4 days. What changed: additional instructions   buPROPion 100 MG tablet Commonly known as: WELLBUTRIN Take 200 mg by mouth at bedtime.   cephALEXin 500 MG capsule Commonly known as: KEFLEX Take 1 capsule (500 mg total) by mouth 4 (four) times daily for 14 days.   cholecalciferol 1000 units tablet Commonly known  as: VITAMIN D Take 2,000 Units by mouth daily.   Vitamin D3 50 MCG (2000 UT) Tabs Take 2,000 Units by mouth in the morning.   cyanocobalamin 1000 MCG/ML injection Commonly known as: (VITAMIN B-12) Inject 1,000 mcg into the muscle every 30 (thirty) days. No specific day of the month.  Administered by daughter.   vitamin B-12 1000 MCG tablet Commonly known as: CYANOCOBALAMIN Take 1,000 mcg by mouth daily.   docusate sodium 100 MG capsule Commonly known as: COLACE Take 100 mg by mouth 2 (two) times daily as needed for mild constipation.   finasteride 5 MG tablet Commonly known as: PROSCAR Take 5 mg by mouth daily.   HYDROcodone-acetaminophen 5-325 MG tablet Commonly known as: NORCO/VICODIN Take 1 tablet by mouth every 6 (six) hours as needed for severe pain.   isosorbide mononitrate 60 MG 24 hr tablet Commonly known as: IMDUR TAKE 1 AND 1/2 TABLETS(90 MG) BY MOUTH DAILY What changed: See the new instructions.   meloxicam 7.5 MG tablet Commonly known as: MOBIC Take 7.5 mg by mouth daily.   metoprolol tartrate 25 MG tablet Commonly known as: LOPRESSOR Take 0.5 tablets (12.5 mg total) by mouth 2 (two) times daily.   multivitamin with minerals Tabs tablet Take 1 tablet by mouth daily.   nitroGLYCERIN 0.4 MG SL tablet Commonly known as: NITROSTAT Place 0.4 mg under the tongue See admin instructions. Dissolve 0.4 mg sublingually 10 minutes before each meal for esophageal spasms and as needed for chest pain   ondansetron 4 MG tablet Commonly known as: ZOFRAN Take 4 mg by mouth every 8 (eight) hours as needed for nausea or vomiting.   pantoprazole 40 MG tablet Commonly known as: PROTONIX Take 40 mg by mouth daily.   polyethylene glycol powder 17 GM/SCOOP powder Commonly known as: GLYCOLAX/MIRALAX Take 17 g by mouth daily as needed for mild constipation (MIX AND DRINK).   ranolazine 1000 MG SR tablet Commonly known as: Ranexa Take 1 tablet (1,000 mg total) by mouth 2 (two) times daily.   rosuvastatin 10 MG tablet Commonly known as: CRESTOR Take 10 mg by mouth daily.   tamsulosin 0.4 MG Caps capsule Commonly known as: FLOMAX Take 0.4 mg by mouth at bedtime.   traMADol 50 MG tablet Commonly known as: Ultram Take 1 tablet (50 mg total) by mouth  every 6 (six) hours as needed. What changed: reasons to take this       Follow-up Information    [name], MD.   Specialty: Internal Medicine Contact information: [address] [place] [address] [phone]              Allergies  Allergen Reactions  . Doxycycline Other (See Comments)    Dizziness  . Flomax [Tamsulosin] Other (See Comments)    Dizziness   . Lipitor [Atorvastatin] Other (See Comments)    Muscle weakness  . Rhubarb Other (See Comments)    From childhood- exact reaction not recalled, but is allergic  . Sulfa Antibiotics Hives  . Sulfacetamide Sodium Hives  . Clindamycin/Lincomycin Rash  . Other Hives, Itching, Rash and Other (See Comments)    UNNAMED ANTIBIOTIC- SEVERE SYMPTOMS  [place]- From childhood- exact reaction not recalled, but is allergic  . Vytorin [Ezetimibe-Simvastatin] Other (See Comments)    Bodyaches    Consultations:  Urology   Procedures/Studies: CT Abdomen Pelvis Wo Contrast  Result Date: 11/19/2019 CLINICAL DATA:  Body aches, chills, hypotensive, sepsis EXAM: CT ABDOMEN AND PELVIS WITHOUT CONTRAST TECHNIQUE: Multidetector  CT imaging of the abdomen and pelvis was performed following the standard protocol without IV contrast. COMPARISON:  08/17/2018 FINDINGS: Lower chest: No acute pleural or parenchymal lung disease. Hepatobiliary: No focal liver abnormality is seen. No gallstones, gallbladder wall thickening, or biliary dilatation. Pancreas: Unremarkable. No pancreatic ductal dilatation or surrounding inflammatory changes. Spleen: Normal in size without focal abnormality. Adrenals/Urinary Tract: There is an obstructing 5 mm mid right ureteral calculus reference image 55, with significant right-sided obstructive uropathy. Mild right perinephric and Peri ureteral fat stranding, concerning for superimposed infection. No left-sided calculi or obstructive uropathy. There are bilateral renal cortical cysts  unchanged. The adrenals are normal. The bladder is moderately distended without focal abnormality. Stomach/Bowel: No bowel obstruction or ileus. Moderate stool throughout the colon. Normal appendix right lower quadrant. No bowel wall thickening or inflammatory change. Vascular/Lymphatic: Aortic atherosclerosis. No enlarged abdominal or pelvic lymph nodes. Reproductive: Calcified prostatic concretions are noted. Otherwise the prostate is unremarkable. Other: No free fluid or free gas. No abdominal wall hernia. Penile prosthesis is identified. Musculoskeletal: No acute or destructive bony lesions. Reconstructed images demonstrate no additional findings. IMPRESSION: 1. Obstructing 5 mm mid right ureteral calculus, with right-sided obstructive uropathy. Right perinephric and Peri ureteral fat stranding may suggest superimposed infection. 2.  Aortic Atherosclerosis (ICD10-I70.0). Electronically Signed   By: Randa Ngo M.D.   On: 11/19/2019 15:28   DG Chest 2 View  Result Date: 11/16/2019 CLINICAL DATA:  Chest pain and shortness of breath EXAM: CHEST - 2 VIEW COMPARISON:  September 20, 2019. FINDINGS: Lungs are clear. Heart size and pulmonary vascularity are normal. No adenopathy. There is aortic atherosclerosis. No pneumothorax. There is degenerative change in the thoracic spine. IMPRESSION: No edema or airspace opacity.  Heart size normal. Aortic Atherosclerosis (ICD10-I70.0). Electronically Signed   By: Lowella Grip III M.D.   On: 11/16/2019 11:55   CT Angio Chest PE W and/or Wo Contrast  Result Date: 11/16/2019 CLINICAL DATA:  Suspected pulmonary embolism. EXAM: CT ANGIOGRAPHY CHEST WITH CONTRAST TECHNIQUE: Multidetector CT imaging of the chest was performed using the standard protocol during bolus administration of intravenous contrast. Multiplanar CT image reconstructions and MIPs were obtained to evaluate the vascular anatomy. CONTRAST:  82mL OMNIPAQUE IOHEXOL 350 MG/ML SOLN COMPARISON:  None. FINDINGS:  Cardiovascular: There is moderate to marked severity calcification of the thoracic aorta, without evidence of aneurysmal dilatation. Satisfactory opacification of the pulmonary arteries to the segmental level. No evidence of pulmonary embolism. Normal heart size with moderate to marked severity coronary artery calcification. No pericardial effusion. Mediastinum/Nodes: No enlarged mediastinal, hilar, or axillary lymph nodes. The thyroid gland and trachea demonstrate no significant findings. A marked amount of ingested material is seen within a mildly distended esophagus. Mild diffuse esophageal wall thickening is also noted. Lungs/Pleura: A trace amount of scarring and/or atelectasis is seen along the posterior aspect of the left lung base and posteromedial aspect of the right lower lobe. There is no evidence of acute infiltrate, pleural effusion or pneumothorax. Upper Abdomen: No acute abnormality. Musculoskeletal: A chronic fracture deformity of the body of the sternum is seen. Moderate to marked severity multilevel degenerative changes are seen throughout the thoracic spine. Review of the MIP images confirms the above findings. IMPRESSION: 1. No evidence of pulmonary embolism or other acute intrathoracic process. 2. Marked amount of ingested material within a mildly distended esophagus with mild diffuse esophageal wall thickening. This may represent sequelae associated with esophagitis. 3. Chronic fracture deformity of the body of the sternum. 4. Aortic  atherosclerosis. Aortic Atherosclerosis (ICD10-I70.0). Electronically Signed   By: Virgina Norfolk M.D.   On: 11/16/2019 21:03   DG Cystogram  Result Date: 11/19/2019 CLINICAL DATA:  Stent placement, right-sided obstructive uropathy EXAM: DG C-ARM 1-60 MIN; CYSTOGRAM - 3+ VIEW FLUOROSCOPY TIME:  Fluoroscopy Time:  11 seconds Radiation Exposure Index (if provided by the fluoroscopic device): 3.049 mGy Number of Acquired Spot Images: 16 COMPARISON:  11/19/2019  FINDINGS: 16 fluoroscopic images are obtained during the performance of the procedure and are provided for interpretation only. Cystoscope is identified, with cannulation and opacification of the right ureter. Tortuosity of the mid right ureter is noted. Subsequent placement of a right ureteral stent, proximal aspect coiled in the right renal pelvis and distal aspect coiled within the bladder. Please refer to the operative report. IMPRESSION: 1. Cystoscopy and right ureteral stent placement as above. Electronically Signed   By: Randa Ngo M.D.   On: 11/19/2019 22:30   DG Chest Port 1 View  Result Date: 11/19/2019 CLINICAL DATA:  Question sepsis.  Altered mental status EXAM: PORTABLE CHEST 1 VIEW COMPARISON:  11/16/2019 FINDINGS: Cardiac and mediastinal contours normal. Vascularity normal. Atherosclerotic calcification aortic arch. Lungs are well aerated and clear.  No infiltrate or effusion. IMPRESSION: No active disease. Electronically Signed   By: Franchot Gallo M.D.   On: 11/19/2019 12:45   DG C-Arm 1-60 Min  Result Date: 11/19/2019 CLINICAL DATA:  Stent placement, right-sided obstructive uropathy EXAM: DG C-ARM 1-60 MIN; CYSTOGRAM - 3+ VIEW FLUOROSCOPY TIME:  Fluoroscopy Time:  11 seconds Radiation Exposure Index (if provided by the fluoroscopic device): 3.049 mGy Number of Acquired Spot Images: 16 COMPARISON:  11/19/2019 FINDINGS: 16 fluoroscopic images are obtained during the performance of the procedure and are provided for interpretation only. Cystoscope is identified, with cannulation and opacification of the right ureter. Tortuosity of the mid right ureter is noted. Subsequent placement of a right ureteral stent, proximal aspect coiled in the right renal pelvis and distal aspect coiled within the bladder. Please refer to the operative report. IMPRESSION: 1. Cystoscopy and right ureteral stent placement as above. Electronically Signed   By: Randa Ngo M.D.   On: 11/19/2019 22:30    ECHOCARDIOGRAM COMPLETE  Result Date: 11/20/2019    ECHOCARDIOGRAM REPORT   Patient Name:   RIELY BASKETT Sr. Date of Exam: 11/20/2019 Medical Rec #:  [id number]        Height:       69.0 in Accession #:    [id number]       Weight:       190.8 lb Date of Birth:  1941/02/01        BSA:          2.025 m Patient Age:    79 years         BP:           148/69 mmHg Patient Gender: M                HR:           84 bpm. Exam Location:  Inpatient Procedure: 2D Echo Indications:    congestive heart failure 428.0  History:        Patient has no prior history of Echocardiogram examinations.                 COPD and sepsis; Risk Factors:Current Smoker, Hypertension,                 Sleep Apnea  and Dyslipidemia.  Sonographer:    Johny Chess Referring Phys: [id number] PING T ZHANG  Sonographer Comments: Technically difficult study due to poor echo windows. Image acquisition challenging due to respiratory motion and could not tolerate exam. IMPRESSIONS  1. Left ventricular ejection fraction, by estimation, is 50 to 55%. The left ventricle has low normal function. The left ventricle has no regional wall motion abnormalities. Left ventricular diastolic parameters are indeterminate.  2. Right ventricular systolic function is normal. The right ventricular size is normal.  3. The mitral valve is normal in structure. No evidence of mitral valve regurgitation. No evidence of mitral stenosis.  4. The aortic valve is tricuspid. Aortic valve regurgitation is not visualized. No aortic stenosis is present. FINDINGS  Left Ventricle: Left ventricular ejection fraction, by estimation, is 50 to 55%. The left ventricle has low normal function. The left ventricle has no regional wall motion abnormalities. The left ventricular internal cavity size was normal in size. There is no left ventricular hypertrophy. Left ventricular diastolic parameters are indeterminate. Right Ventricle: The right ventricular size is normal. No increase in right  ventricular wall thickness. Right ventricular systolic function is normal. Left Atrium: Left atrial size was normal in size. Right Atrium: Right atrial size was normal in size. Pericardium: There is no evidence of pericardial effusion. Mitral Valve: The mitral valve is normal in structure. No evidence of mitral valve regurgitation. No evidence of mitral valve stenosis. Tricuspid Valve: The tricuspid valve is normal in structure. Tricuspid valve regurgitation is trivial. No evidence of tricuspid stenosis. Aortic Valve: The aortic valve is tricuspid. Aortic valve regurgitation is not visualized. No aortic stenosis is present. Pulmonic Valve: The pulmonic valve was normal in structure. Pulmonic valve regurgitation is not visualized. No evidence of pulmonic stenosis. Aorta: The aortic root is normal in size and structure. IAS/Shunts: No atrial level shunt detected by color flow Doppler.  LEFT VENTRICLE PLAX 2D LVIDd:         4.80 cm LVIDs:         3.70 cm LV PW:         0.90 cm LV IVS:        1.10 cm LVOT diam:     2.10 cm LVOT Area:     3.46 cm  LEFT ATRIUM         Index LA diam:    4.10 cm 2.02 cm/m   AORTA Ao Root diam: 3.30 cm TRICUSPID VALVE TR Peak grad:   17.3 mmHg TR Vmax:        208.00 cm/s  SHUNTS Systemic Diam: 2.10 cm Skeet Latch MD Electronically signed by Skeet Latch MD Signature Date/Time: 11/20/2019/3:41:15 PM    Final     (Echo, Carotid, EGD, Colonoscopy, ERCP)    Subjective: No new complaints feels great  Discharge Exam: Vitals:   11/23/19 0526 11/23/19 0746  BP: (!) 141/71 111/68  Pulse: (!) 58 65  Resp: 20 18  Temp: 98.1 F (36.7 C) 98 F (36.7 C)  SpO2: 93% 94%   Vitals:   11/22/19 1941 11/23/19 0145 11/23/19 0526 11/23/19 0746  BP: (!) 146/78 (!) 150/74 (!) 141/71 111/68  Pulse: (!) 51 60 (!) 58 65  Resp: 18 14 20 18   Temp: (!) 97.3 F (36.3 C) 98.1 F (36.7 C) 98.1 F (36.7 C) 98 F (36.7 C)  TempSrc: Oral Oral Oral Oral  SpO2: 96% 92% 93% 94%  Weight:    84 kg   Height:  General: Pt is alert, awake, not in acute distress Cardiovascular: RRR, S1/S2 +, no rubs, no gallops Respiratory: CTA bilaterally, no wheezing, no rhonchi Abdominal: Soft, NT, ND, bowel sounds + Extremities: no edema, no cyanosis    The results of significant diagnostics from this hospitalization (including imaging, microbiology, ancillary and laboratory) are listed below for reference.     Microbiology: Recent Results (from the past 240 hour(s))  SARS Coronavirus 2 by RT PCR (hospital order, performed in [facility] hospital lab) Nasopharyngeal Nasopharyngeal Swab     Status: Abnormal   Collection Time: 11/16/19  7:20 PM   Specimen: Nasopharyngeal Swab  Result Value Ref Range Status   SARS Coronavirus 2 POSITIVE (A) NEGATIVE Final    Comment: RESULT CALLED TO, READ BACK BY AND VERIFIED WITH: D,SIDBURY @2158  11/16/19 EB (NOTE) SARS-CoV-2 target nucleic acids are DETECTED  SARS-CoV-2 RNA is generally detectable in upper respiratory specimens  during the acute phase of infection.  Positive results are indicative  of the presence of the identified virus, but do not rule out bacterial infection or co-infection with other pathogens not detected by the test.  Clinical correlation with patient history and  other diagnostic information is necessary to determine patient infection status.  The expected result is negative.  Fact Sheet for Patients:   StrictlyIdeas.no   Fact Sheet for Healthcare Providers:   BankingDealers.co.za    This test is not yet approved or cleared by the [place] FDA and  has been authorized for detection and/or diagnosis of SARS-CoV-2 by FDA under an Emergency Use Authorization (EUA).  This EUA will remain in effect (meaning this test can b e used) for the duration of  the COVID-19 declaration under Section 564(b)(1) of the Act, 21 U.S.C. section 360-bbb-3(b)(1), unless the  authorization is terminated or revoked sooner.  Performed at [facility], [place] [address], [place], [place] [address]   Urine culture     Status: Abnormal   Collection Time: 11/19/19 12:18 PM   Specimen: In/Out Cath Urine  Result Value Ref Range Status   Specimen Description IN/OUT CATH URINE  Final   Special Requests   Final    NONE Performed at [facility], [place] [address] [place], [place] [address]    Culture 80,000 COLONIES/mL STAPHYLOCOCCUS EPIDERMIDIS (A)  Final   Report Status 11/21/2019 FINAL  Final   Organism ID, Bacteria STAPHYLOCOCCUS EPIDERMIDIS (A)  Final      Susceptibility   Staphylococcus epidermidis - MIC*    CIPROFLOXACIN >=8 RESISTANT Resistant     GENTAMICIN <=0.5 SENSITIVE Sensitive     NITROFURANTOIN <=16 SENSITIVE Sensitive     OXACILLIN <=0.25 SENSITIVE Sensitive     TETRACYCLINE 2 SENSITIVE Sensitive     VANCOMYCIN 1 SENSITIVE Sensitive     TRIMETH/SULFA <=10 SENSITIVE Sensitive     CLINDAMYCIN <=0.25 SENSITIVE Sensitive     RIFAMPIN <=0.5 SENSITIVE Sensitive     Inducible Clindamycin NEGATIVE Sensitive     * 80,000 COLONIES/mL STAPHYLOCOCCUS EPIDERMIDIS  Blood Culture (routine x 2)     Status: Abnormal   Collection Time: 11/19/19 12:28 PM   Specimen: BLOOD  Result Value Ref Range Status   Specimen Description BLOOD RIGHT ANTECUBITAL  Final   Special Requests   Final    BOTTLES DRAWN AEROBIC AND ANAEROBIC Blood Culture adequate volume   Culture  Setup Time   Final    GRAM POSITIVE COCCI IN BOTH AEROBIC AND ANAEROBIC BOTTLES CRITICAL VALUE NOTED.  VALUE IS  CONSISTENT WITH PREVIOUSLY REPORTED AND CALLED VALUE.    Culture (A)  Final    STAPHYLOCOCCUS EPIDERMIDIS SUSCEPTIBILITIES PERFORMED ON PREVIOUS CULTURE WITHIN THE LAST 5 DAYS. Performed at [facility], [place] [address], [place], [place] [address]    Report Status 11/22/2019 FINAL  Final  Blood Culture (routine x 2)     Status: Abnormal   Collection Time: 11/19/19 12:45 PM    Specimen: BLOOD  Result Value Ref Range Status   Specimen Description BLOOD LEFT ANTECUBITAL  Final   Special Requests   Final    BOTTLES DRAWN AEROBIC AND ANAEROBIC Blood Culture adequate volume   Culture  Setup Time   Final    GRAM POSITIVE COCCI IN CLUSTERS IN BOTH AEROBIC AND ANAEROBIC BOTTLES CRITICAL RESULT CALLED TO, READ BACK BY AND VERIFIED WITH: M. PHAM PHARMD, AT 1506 11/20/19 BY DV Performed at [facility], [place] [address], [place], [place] [address]    Culture STAPHYLOCOCCUS EPIDERMIDIS (A)  Final   Report Status 11/22/2019 FINAL  Final   Organism ID, Bacteria STAPHYLOCOCCUS EPIDERMIDIS  Final      Susceptibility   Staphylococcus epidermidis - MIC*    CIPROFLOXACIN >=8 RESISTANT Resistant     ERYTHROMYCIN <=0.25 SENSITIVE Sensitive     GENTAMICIN <=0.5 SENSITIVE Sensitive     OXACILLIN <=0.25 SENSITIVE Sensitive     TETRACYCLINE 2 SENSITIVE Sensitive     VANCOMYCIN 1 SENSITIVE Sensitive     TRIMETH/SULFA <=10 SENSITIVE Sensitive     CLINDAMYCIN <=0.25 SENSITIVE Sensitive     RIFAMPIN <=0.5 SENSITIVE Sensitive     Inducible Clindamycin NEGATIVE Sensitive     * STAPHYLOCOCCUS EPIDERMIDIS  Blood Culture ID Panel (Reflexed)     Status: Abnormal   Collection Time: 11/19/19 12:45 PM  Result Value Ref Range Status   Enterococcus faecalis NOT DETECTED NOT DETECTED Final   Enterococcus Faecium NOT DETECTED NOT DETECTED Final   Listeria monocytogenes NOT DETECTED NOT DETECTED Final   Staphylococcus species DETECTED (A) NOT DETECTED Final    Comment: CRITICAL RESULT CALLED TO, READ BACK BY AND VERIFIED WITH: M. PHAM PHARMD, AT 1506 11/20/19 BY DV    Staphylococcus aureus (BCID) NOT DETECTED NOT DETECTED Final   Staphylococcus epidermidis DETECTED (A) NOT DETECTED Final    Comment: CRITICAL RESULT CALLED TO, READ BACK BY AND VERIFIED WITH: M. PHAM PHARMD, AT 1506 11/20/19 BY DV    Staphylococcus lugdunensis NOT DETECTED NOT DETECTED Final   Streptococcus species NOT  DETECTED NOT DETECTED Final   Streptococcus agalactiae NOT DETECTED NOT DETECTED Final   Streptococcus pneumoniae NOT DETECTED NOT DETECTED Final   Streptococcus pyogenes NOT DETECTED NOT DETECTED Final   A.calcoaceticus-baumannii NOT DETECTED NOT DETECTED Final   Bacteroides fragilis NOT DETECTED NOT DETECTED Final   Enterobacterales NOT DETECTED NOT DETECTED Final   Enterobacter cloacae complex NOT DETECTED NOT DETECTED Final   Escherichia coli NOT DETECTED NOT DETECTED Final   Klebsiella aerogenes NOT DETECTED NOT DETECTED Final   Klebsiella oxytoca NOT DETECTED NOT DETECTED Final   Klebsiella pneumoniae NOT DETECTED NOT DETECTED Final   Proteus species NOT DETECTED NOT DETECTED Final   Salmonella species NOT DETECTED NOT DETECTED Final   Serratia marcescens NOT DETECTED NOT DETECTED Final   Haemophilus influenzae NOT DETECTED NOT DETECTED Final   Neisseria meningitidis NOT DETECTED NOT DETECTED Final   Pseudomonas aeruginosa NOT DETECTED NOT DETECTED Final   Stenotrophomonas maltophilia NOT DETECTED NOT DETECTED Final   Candida albicans NOT DETECTED NOT  DETECTED Final   Candida auris NOT DETECTED NOT DETECTED Final   Candida glabrata NOT DETECTED NOT DETECTED Final   Candida krusei NOT DETECTED NOT DETECTED Final   Candida parapsilosis NOT DETECTED NOT DETECTED Final   Candida tropicalis NOT DETECTED NOT DETECTED Final   Cryptococcus neoformans/gattii NOT DETECTED NOT DETECTED Final   Methicillin resistance mecA/C NOT DETECTED NOT DETECTED Final    Comment: Performed at [facility], [place] [address] [place], [place] [address]  Culture, blood (Routine X 2) w Reflex to ID Panel     Status: None (Preliminary result)   Collection Time: 11/21/19  3:42 PM   Specimen: BLOOD LEFT ARM  Result Value Ref Range Status   Specimen Description BLOOD LEFT ARM  Final   Special Requests   Final    BOTTLES DRAWN AEROBIC ONLY Blood Culture adequate volume   Culture   Final    NO GROWTH 2  DAYS Performed at [facility], [place] [address] [place], [place] [address]    Report Status PENDING  Incomplete  Culture, blood (Routine X 2) w Reflex to ID Panel     Status: None (Preliminary result)   Collection Time: 11/21/19  5:36 PM   Specimen: BLOOD LEFT ARM  Result Value Ref Range Status   Specimen Description BLOOD LEFT ARM  Final   Special Requests   Final    BOTTLES DRAWN AEROBIC ONLY Blood Culture adequate volume   Culture   Final    NO GROWTH 2 DAYS Performed at [facility], [place] [address] [place], [place] [address]    Report Status PENDING  Incomplete     Labs: BNP (last 3 results) No results for input(s): BNP in the last 8760 hours. Basic Metabolic Panel: Recent Labs  Lab 11/19/19 1130 11/20/19 0253 11/21/19 0604 11/22/19 0439 11/23/19 0501  NA 132* 141 136 136 136  K 3.9 4.5 4.0 3.8 4.0  CL 96* 105 103 98 98  CO2 26 27 28 30 29   GLUCOSE 103* 188* 140* 88 87  BUN 29* 22 19 16 18   CREATININE 1.61* 1.10 0.83 0.81 0.86  CALCIUM 9.1 9.2 9.4 9.3 9.3   Liver Function Tests: Recent Labs  Lab 11/19/19 1130  AST 19  ALT 16  ALKPHOS 62  BILITOT 1.1  PROT 6.2*  ALBUMIN 2.9*   No results for input(s): LIPASE, AMYLASE in the last 168 hours. No results for input(s): AMMONIA in the last 168 hours. CBC: Recent Labs  Lab 11/16/19 1135 11/19/19 1130 11/20/19 0253  WBC 6.3 13.3*  13.2* 9.6  NEUTROABS  --  12.2*  --   HGB 12.9* 12.9*  12.8* 11.9*  HCT 39.8 39.7  39.7 36.6*  MCV 102.6* 102.1*  101.8* 100.8*  PLT 200 163  150 126*   Cardiac Enzymes: No results for input(s): CKTOTAL, CKMB, CKMBINDEX, TROPONINI in the last 168 hours. BNP: Invalid input(s): POCBNP CBG: Recent Labs  Lab 11/22/19 0602 11/22/19 1115 11/22/19 1623 11/22/19 2117 11/23/19 0613  GLUCAP 87 92 104* 95 95   D-Dimer No results for input(s): DDIMER in the last 72 hours. Hgb A1c No results for input(s): HGBA1C in the last 72 hours. Lipid Profile No results  for input(s): CHOL, HDL, LDLCALC, TRIG, CHOLHDL, LDLDIRECT in the last 72 hours. Thyroid function studies No results for input(s): TSH, T4TOTAL, T3FREE, THYROIDAB in the last 72 hours.  Invalid input(s): FREET3 Anemia work up No results for input(s): VITAMINB12, FOLATE, FERRITIN, TIBC,  IRON, RETICCTPCT in the last 72 hours. Urinalysis    Component Value Date/Time   COLORURINE AMBER (A) 11/19/2019 1427   APPEARANCEUR HAZY (A) 11/19/2019 1427   LABSPEC 1.009 11/19/2019 1427   PHURINE 5.0 11/19/2019 1427   GLUCOSEU NEGATIVE 11/19/2019 1427   HGBUR LARGE (A) 11/19/2019 1427   BILIRUBINUR NEGATIVE 11/19/2019 1427   KETONESUR NEGATIVE 11/19/2019 1427   PROTEINUR 30 (A) 11/19/2019 1427   UROBILINOGEN 1.0 01/31/2009 1427   NITRITE NEGATIVE 11/19/2019 1427   LEUKOCYTESUR LARGE (A) 11/19/2019 1427   Sepsis Labs Invalid input(s): PROCALCITONIN,  WBC,  LACTICIDVEN Microbiology Recent Results (from the past 240 hour(s))  SARS Coronavirus 2 by RT PCR (hospital order, performed in [place] hospital lab) Nasopharyngeal Nasopharyngeal Swab     Status: Abnormal   Collection Time: 11/16/19  7:20 PM   Specimen: Nasopharyngeal Swab  Result Value Ref Range Status   SARS Coronavirus 2 POSITIVE (A) NEGATIVE Final    Comment: RESULT CALLED TO, READ BACK BY AND VERIFIED WITH: D,SIDBURY @2158  11/16/19 EB (NOTE) SARS-CoV-2 target nucleic acids are DETECTED  SARS-CoV-2 RNA is generally detectable in upper respiratory specimens  during the acute phase of infection.  Positive results are indicative  of the presence of the identified virus, but do not rule out bacterial infection or co-infection with other pathogens not detected by the test.  Clinical correlation with patient history and  other diagnostic information is necessary to determine patient infection status.  The expected result is negative.  Fact Sheet for Patients:   StrictlyIdeas.no   Fact Sheet for Healthcare  Providers:   BankingDealers.co.za    This test is not yet approved or cleared by the [place] FDA and  has been authorized for detection and/or diagnosis of SARS-CoV-2 by FDA under an Emergency Use Authorization (EUA).  This EUA will remain in effect (meaning this test can b e used) for the duration of  the COVID-19 declaration under Section 564(b)(1) of the Act, 21 U.S.C. section 360-bbb-3(b)(1), unless the authorization is terminated or revoked sooner.  Performed at [facility], [place] [address], [place], [place] [address]   Urine culture     Status: Abnormal   Collection Time: 11/19/19 12:18 PM   Specimen: In/Out Cath Urine  Result Value Ref Range Status   Specimen Description IN/OUT CATH URINE  Final   Special Requests   Final    NONE Performed at [facility], [place] [address] [place], [place] [address]    Culture 80,000 COLONIES/mL STAPHYLOCOCCUS EPIDERMIDIS (A)  Final   Report Status 11/21/2019 FINAL  Final   Organism ID, Bacteria STAPHYLOCOCCUS EPIDERMIDIS (A)  Final      Susceptibility   Staphylococcus epidermidis - MIC*    CIPROFLOXACIN >=8 RESISTANT Resistant     GENTAMICIN <=0.5 SENSITIVE Sensitive     NITROFURANTOIN <=16 SENSITIVE Sensitive     OXACILLIN <=0.25 SENSITIVE Sensitive     TETRACYCLINE 2 SENSITIVE Sensitive     VANCOMYCIN 1 SENSITIVE Sensitive     TRIMETH/SULFA <=10 SENSITIVE Sensitive     CLINDAMYCIN <=0.25 SENSITIVE Sensitive     RIFAMPIN <=0.5 SENSITIVE Sensitive     Inducible Clindamycin NEGATIVE Sensitive     * 80,000 COLONIES/mL STAPHYLOCOCCUS EPIDERMIDIS  Blood Culture (routine x 2)     Status: Abnormal   Collection Time: 11/19/19 12:28 PM   Specimen: BLOOD  Result Value Ref Range Status   Specimen Description BLOOD RIGHT ANTECUBITAL  Final   Special Requests  Final    BOTTLES DRAWN AEROBIC AND ANAEROBIC Blood Culture adequate volume   Culture  Setup Time   Final    GRAM POSITIVE COCCI IN BOTH AEROBIC  AND ANAEROBIC BOTTLES CRITICAL VALUE NOTED.  VALUE IS CONSISTENT WITH PREVIOUSLY REPORTED AND CALLED VALUE.    Culture (A)  Final    STAPHYLOCOCCUS EPIDERMIDIS SUSCEPTIBILITIES PERFORMED ON PREVIOUS CULTURE WITHIN THE LAST 5 DAYS. Performed at [facility], [place] [address] [place], [place] [address]    Report Status 11/22/2019 FINAL  Final  Blood Culture (routine x 2)     Status: Abnormal   Collection Time: 11/19/19 12:45 PM   Specimen: BLOOD  Result Value Ref Range Status   Specimen Description BLOOD LEFT ANTECUBITAL  Final   Special Requests   Final    BOTTLES DRAWN AEROBIC AND ANAEROBIC Blood Culture adequate volume   Culture  Setup Time   Final    GRAM POSITIVE COCCI IN CLUSTERS IN BOTH AEROBIC AND ANAEROBIC BOTTLES CRITICAL RESULT CALLED TO, READ BACK BY AND VERIFIED WITH: M. PHAM PHARMD, AT 1506 11/20/19 BY DV Performed at [facility], [place] [address] [place], [place] [address]    Culture STAPHYLOCOCCUS EPIDERMIDIS (A)  Final   Report Status 11/22/2019 FINAL  Final   Organism ID, Bacteria STAPHYLOCOCCUS EPIDERMIDIS  Final      Susceptibility   Staphylococcus epidermidis - MIC*    CIPROFLOXACIN >=8 RESISTANT Resistant     ERYTHROMYCIN <=0.25 SENSITIVE Sensitive     GENTAMICIN <=0.5 SENSITIVE Sensitive     OXACILLIN <=0.25 SENSITIVE Sensitive     TETRACYCLINE 2 SENSITIVE Sensitive     VANCOMYCIN 1 SENSITIVE Sensitive     TRIMETH/SULFA <=10 SENSITIVE Sensitive     CLINDAMYCIN <=0.25 SENSITIVE Sensitive     RIFAMPIN <=0.5 SENSITIVE Sensitive     Inducible Clindamycin NEGATIVE Sensitive     * STAPHYLOCOCCUS EPIDERMIDIS  Blood Culture ID Panel (Reflexed)     Status: Abnormal   Collection Time: 11/19/19 12:45 PM  Result Value Ref Range Status   Enterococcus faecalis NOT DETECTED NOT DETECTED Final   Enterococcus Faecium NOT DETECTED NOT DETECTED Final   Listeria monocytogenes NOT DETECTED NOT DETECTED Final   Staphylococcus species DETECTED (A) NOT DETECTED  Final    Comment: CRITICAL RESULT CALLED TO, READ BACK BY AND VERIFIED WITH: M. PHAM PHARMD, AT 1506 11/20/19 BY DV    Staphylococcus aureus (BCID) NOT DETECTED NOT DETECTED Final   Staphylococcus epidermidis DETECTED (A) NOT DETECTED Final    Comment: CRITICAL RESULT CALLED TO, READ BACK BY AND VERIFIED WITH: M. PHAM PHARMD, AT 1506 11/20/19 BY DV    Staphylococcus lugdunensis NOT DETECTED NOT DETECTED Final   Streptococcus species NOT DETECTED NOT DETECTED Final   Streptococcus agalactiae NOT DETECTED NOT DETECTED Final   Streptococcus pneumoniae NOT DETECTED NOT DETECTED Final   Streptococcus pyogenes NOT DETECTED NOT DETECTED Final   A.calcoaceticus-baumannii NOT DETECTED NOT DETECTED Final   Bacteroides fragilis NOT DETECTED NOT DETECTED Final   Enterobacterales NOT DETECTED NOT DETECTED Final   Enterobacter cloacae complex NOT DETECTED NOT DETECTED Final   Escherichia coli NOT DETECTED NOT DETECTED Final   Klebsiella aerogenes NOT DETECTED NOT DETECTED Final   Klebsiella oxytoca NOT DETECTED NOT DETECTED Final   Klebsiella pneumoniae NOT DETECTED NOT DETECTED Final   Proteus species NOT DETECTED NOT DETECTED Final   Salmonella species NOT DETECTED NOT DETECTED Final   Serratia marcescens NOT DETECTED NOT DETECTED Final   Haemophilus  influenzae NOT DETECTED NOT DETECTED Final   Neisseria meningitidis NOT DETECTED NOT DETECTED Final   Pseudomonas aeruginosa NOT DETECTED NOT DETECTED Final   Stenotrophomonas maltophilia NOT DETECTED NOT DETECTED Final   Candida albicans NOT DETECTED NOT DETECTED Final   Candida auris NOT DETECTED NOT DETECTED Final   Candida glabrata NOT DETECTED NOT DETECTED Final   Candida krusei NOT DETECTED NOT DETECTED Final   Candida parapsilosis NOT DETECTED NOT DETECTED Final   Candida tropicalis NOT DETECTED NOT DETECTED Final   Cryptococcus neoformans/gattii NOT DETECTED NOT DETECTED Final   Methicillin resistance mecA/C NOT DETECTED NOT DETECTED Final     Comment: Performed at [facility], [place] [address] [place], [place] [address]  Culture, blood (Routine X 2) w Reflex to ID Panel     Status: None (Preliminary result)   Collection Time: 11/21/19  3:42 PM   Specimen: BLOOD LEFT ARM  Result Value Ref Range Status   Specimen Description BLOOD LEFT ARM  Final   Special Requests   Final    BOTTLES DRAWN AEROBIC ONLY Blood Culture adequate volume   Culture   Final    NO GROWTH 2 DAYS Performed at [facility], [place] [address] [place], [place] [address]    Report Status PENDING  Incomplete  Culture, blood (Routine X 2) w Reflex to ID Panel     Status: None (Preliminary result)   Collection Time: 11/21/19  5:36 PM   Specimen: BLOOD LEFT ARM  Result Value Ref Range Status   Specimen Description BLOOD LEFT ARM  Final   Special Requests   Final    BOTTLES DRAWN AEROBIC ONLY Blood Culture adequate volume   Culture   Final    NO GROWTH 2 DAYS Performed at [facility], [place] [address] [place], [place] [address]    Report Status PENDING  Incomplete     Time coordinating discharge: Over 30 minutes  SIGNED:   Charlynne Cousins, MD  [facility] 11/23/2019, 9:52 AM Pager   If 7PM-7AM, please contact night-coverage [email or web address] Password [email or web address]

## 2019-11-23 NOTE — Plan of Care (Signed)

## 2019-11-23 NOTE — Progress Notes (Signed)
D/C instructions given and reviewed. Questions answered and encouraged to call with any further concerns. IV's removed. Tolerated well.

## 2019-11-26 LAB — CULTURE, BLOOD (ROUTINE X 2)
Culture: NO GROWTH
Culture: NO GROWTH
Special Requests: ADEQUATE
Special Requests: ADEQUATE

## 2019-11-30 ENCOUNTER — Other Ambulatory Visit: Payer: Self-pay | Admitting: Urology

## 2019-12-03 ENCOUNTER — Other Ambulatory Visit: Payer: Self-pay | Admitting: Urology

## 2019-12-03 ENCOUNTER — Telehealth: Payer: Self-pay | Admitting: Interventional Cardiology

## 2019-12-03 NOTE — Telephone Encounter (Signed)
[name] is calling stating [name] [name] was recently hospitalized on 11/19/19 and is requesting Dr. [name] look over the notes to see if there is anything he advises. She states they did an Echo and he also had to have emergency surgery to have a stent placed. He has been scheduled for another surgery on 12/22/19 as well. [name] is also requesting a callback in regards to [place] for patient assistance to help with the price of Ranexa that he is currently on. Please advise.

## 2019-12-03 NOTE — Telephone Encounter (Signed)
**Note De-Identified  Obfuscation** Forwarding this note to PharmD for advisement on best ways to lower the pts cost of Ranexa.

## 2019-12-03 NOTE — Telephone Encounter (Signed)
Have you tried a tier exception- with the claim that its a generic medication, therefore should be on a generic tier? Wont help that much if he is in the donut hole.  Other option would be good rx coupon. Best one I see is at [employer] for 35.94. Then at [employer] for 41.56

## 2019-12-03 NOTE — Telephone Encounter (Signed)
Will send to Dr. [name] to review hospital notes and echo to see if anything needs to be done cardiac wise.   Will route to [place], LPN to see if we can do a PA or pt assistance for Ranexa.

## 2019-12-06 NOTE — Telephone Encounter (Signed)
**Note De-Identified  Obfuscation** I attempted to do a Ranolazine tier exception through covermymeds but this tier exception must be done over the phone per message from [place]  I called [facility] and did the tier exception with [facility]. Per Lorayne Bender we will receive a fax and the pt will receive a determination letter once determination is made.  I called the pts daughter [name] [facility]) and made her aware that I have done the tier exception and we discussed [place]. [name] states that the pt is paying $145/90 day supply for his Ranexa at this time and that [facility] would save him about $30 every 3 months which helps but she is requesting that I look into a pt asst program for Ranexa as another option while we are waiting on a reply from [facility].  I have advised her that I will call her back with the determination on the pts Ranolazine tier exception and that I will check on pt asst as well.  She thanked me for my help and thanked me for calling her back.

## 2019-12-07 MED ORDER — RANOLAZINE ER 1000 MG PO TB12
1000.0000 mg | ORAL_TABLET | Freq: Two times a day (BID) | ORAL | 0 refills | Status: DC
Start: 2019-12-07 — End: 2020-03-15

## 2019-12-07 NOTE — Telephone Encounter (Signed)
**Note De-Identified  Obfuscation** Letter received from [place] stating that the pt must try and fail Tier 2 meds: Bisoprolol and Propranolol and Tier 1 meds: Atenolol and Carvedilol prior to them considering a tier exception for Ranolazine.  I called [name] to discuss the pts options and she is requesting that I send the pts Ranolazine RX to [facility] in [place] to fill as she states they will use [facility] as she feels that Ranolazine is the best option for the pt as he is doing well on it.  I have escribed the pts Ranolazine 1000 mg #180 with no refills to [name] to fill as requested.

## 2019-12-10 NOTE — Patient Instructions (Addendum)
DUE TO COVID-19 ONLY ONE VISITOR IS ALLOWED TO COME WITH YOU AND STAY IN THE WAITING ROOM ONLY DURING PRE OP AND PROCEDURE DAY OF SURGERY. THE 1 VISITOR  MAY VISIT WITH YOU AFTER SURGERY IN YOUR PRIVATE ROOM DURING VISITING HOURS ONLY!    PLEASE BEGIN THE QUARANTINE INSTRUCTIONS AS OUTLINED IN YOUR HANDOUT.                [name] [name]    Your procedure is scheduled on: 12/22/19   Report to [facility] Main  Entrance   Report to Short stay at 5:30 M     Call this number if you have problems the morning of surgery [phone]    Remember: Do not eat food or drink liquids :After Midnight.   BRUSH YOUR TEETH MORNING OF SURGERY AND RINSE YOUR MOUTH OUT, NO CHEWING GUM CANDY OR MINTS.     Take these medicines the morning of surgery with A SIP OF WATER: Isorbide, Ranolazine, Wellbutrin, Metoprolol, Finesteride, Pantoprazole.               Bring your mask and tubing to the hospital with you.  DO NOT TAKE ANY DIABETIC MEDICATIONS DAY OF YOUR SURGERY                               You may not have any metal on your body including             piercings  Do not wear jewelry, lotions, powders or deodorant                         Men may shave face and neck.   Do not bring valuables to the hospital. [place].  Contacts, dentures or bridgework may not be worn into surgery.       Patients discharged the day of surgery will not be allowed to drive home.   IF YOU ARE HAVING SURGERY AND GOING HOME THE SAME DAY, YOU MUST HAVE AN ADULT TO DRIVE YOU HOME AND BE WITH YOU FOR 24 HOURS.   YOU MAY GO HOME BY TAXI OR UBER OR ORTHERWISE, BUT AN ADULT MUST ACCOMPANY YOU HOME AND STAY WITH YOU FOR 24 HOURS.  Name and phone number of your driver:  Special Instructions: N/A              Please read over the following fact sheets you were given: _____________________________________________________________________             [facility] -  Preparing for Surgery  Before surgery, you can play an important role.   Because skin is not sterile, your skin needs to be as free of germs as possible.   You can reduce the number of germs on your skin by washing with CHG (chlorahexidine gluconate) soap before surgery.   CHG is an antiseptic cleaner which kills germs and bonds with the skin to continue killing germs even after washing. Please DO NOT use if you have an allergy to CHG or antibacterial soaps.   If your skin becomes reddened/irritated stop using the CHG and inform your nurse when you arrive at Short Stay.   You may shave your face/neck.  Please follow these instructions carefully:  1.  Shower with CHG Soap the night before surgery  and the  morning of Surgery.  2.  If you choose to wash your hair, wash your hair first as usual with your  normal  shampoo.  3.  After you shampoo, rinse your hair and body thoroughly to remove the  shampoo.                                        4.  Use CHG as you would any other liquid soap.  You can apply chg directly  to the skin and wash                       Gently with a scrungie or clean washcloth.  5.  Apply the CHG Soap to your body ONLY FROM THE NECK DOWN.   Do not use on face/ open                           Wound or open sores. Avoid contact with eyes, ears mouth and genitals (private parts).                       Wash face,  Genitals (private parts) with your normal soap.             6.  Wash thoroughly, paying special attention to the area where your surgery  will be performed.  7.  Thoroughly rinse your body with warm water from the neck down.  8.  DO NOT shower/wash with your normal soap after using and rinsing off  the CHG Soap.             9.  Pat yourself dry with a clean towel.            10.  Wear clean pajamas.            11.  Place clean sheets on your bed the night of your first shower and do not  sleep with pets. Day of Surgery : Do not apply any lotions/deodorants the  morning of surgery.  Please wear clean clothes to the hospital/surgery center.  FAILURE TO FOLLOW THESE INSTRUCTIONS MAY RESULT IN THE CANCELLATION OF YOUR SURGERY PATIENT SIGNATURE_________________________________  NURSE SIGNATURE__________________________________  ________________________________________________________________________

## 2019-12-13 ENCOUNTER — Encounter (INDEPENDENT_AMBULATORY_CARE_PROVIDER_SITE_OTHER): Payer: Self-pay

## 2019-12-13 ENCOUNTER — Encounter (HOSPITAL_COMMUNITY): Payer: Self-pay

## 2019-12-13 ENCOUNTER — Encounter (HOSPITAL_COMMUNITY)
Admission: RE | Admit: 2019-12-13 | Discharge: 2019-12-13 | Disposition: A | Discharge status: Home / Self Care | Payer: Medicare Other | Source: Ambulatory Visit | Attending: Urology | Admitting: Urology

## 2019-12-13 ENCOUNTER — Other Ambulatory Visit: Payer: Self-pay

## 2019-12-13 DIAGNOSIS — Z01818 Encounter for other preprocedural examination: Secondary | ICD-10-CM | POA: Diagnosis not present

## 2019-12-13 HISTORY — DX: Angina pectoris, unspecified: I20.9

## 2019-12-13 HISTORY — DX: Personal history of urinary calculi: Z87.442

## 2019-12-13 HISTORY — DX: Anxiety disorder, unspecified: F41.9

## 2019-12-13 HISTORY — DX: Prediabetes: R73.03

## 2019-12-13 LAB — BASIC METABOLIC PANEL
Anion gap: 7 (ref 5–15)
BUN: 22 mg/dL (ref 8–23)
CO2: 27 mmol/L (ref 22–32)
Calcium: 9.1 mg/dL (ref 8.9–10.3)
Chloride: 103 mmol/L (ref 98–111)
Creatinine, Ser: 0.99 mg/dL (ref 0.61–1.24)
GFR, Estimated: >60 mL/min (ref >60)
Glucose, Bld: 115 mg/dL — ABNORMAL HIGH (ref 70–99)
Potassium: 4.4 mmol/L (ref 3.5–5.1)
Sodium: 137 mmol/L (ref 135–145)

## 2019-12-13 LAB — CBC
HCT: 36.9 % — ABNORMAL LOW (ref 39.0–52.0)
Hemoglobin: 12.2 g/dL — ABNORMAL LOW (ref 13.0–17.0)
MCH: 33.5 pg (ref 26.0–34.0)
MCHC: 33.1 g/dL (ref 30.0–36.0)
MCV: 101.4 fL — ABNORMAL HIGH (ref 80.0–100.0)
Platelets: 189 10*3/uL (ref 150–400)
RBC: 3.64 MIL/uL — ABNORMAL LOW (ref 4.22–5.81)
RDW: 13.6 % (ref 11.5–15.5)
WBC: 5.9 10*3/uL (ref 4.0–10.5)
nRBC: 0 % (ref 0.0–0.2)

## 2019-12-13 LAB — HEMOGLOBIN A1C
Hgb A1c MFr Bld: 5.4 % (ref 4.8–5.6)
Mean Plasma Glucose: 108 mg/dL

## 2019-12-13 NOTE — Progress Notes (Addendum)
COVID Vaccine Completed:Yes Date COVID Vaccine completed:04/12/19 COVID vaccine manufacturer:  Moderna     PCP - Dr. Particia Nearing Cardiologist - Dr. Linard Millers  Chest x-ray - no EKG - 11/25/19-Epic Stress Test - no ECHO - 11/20/19-Epic Cardiac Cath - 2020-Epic Pacemaker/ICD device last checked:NA  Sleep Study - yes CPAP - no. He hasn't used it for 5 years and has had a wt loss  Fasting Blood Sugar - Pre diabetic Checks Blood Sugar _____ times a day  Blood Thinner Instructions:ASA/ [place] Aspirin Instructions:stop 5 days prior to DOS/ Louis Meckel Last Dose:12/17/19  Anesthesia review:   Patient denies shortness of breath, fever, cough and chest pain at PAT appointment yes   Patient verbalized understanding of instructions that were given to them at the PAT appointment. Patient was also instructed that they will need to review over the PAT instructions again at home before surgery. Yes  Pt is a smoker but denies SOB with ADLs .  He doesn't work or climb stairs.  He was covid + in August and falls in the 90 day policy. Documentation is on chart.  Pt has an esophageal stricture.

## 2019-12-22 ENCOUNTER — Ambulatory Visit (HOSPITAL_COMMUNITY): Payer: Medicare Other

## 2019-12-22 ENCOUNTER — Ambulatory Visit (HOSPITAL_COMMUNITY)
Admission: RE | Admit: 2019-12-22 | Discharge: 2019-12-22 | Disposition: A | Discharge status: Home / Self Care | Payer: Medicare Other | Source: Ambulatory Visit | Attending: Urology | Admitting: Urology

## 2019-12-22 ENCOUNTER — Encounter (HOSPITAL_COMMUNITY)
Admission: RE | Disposition: A | Discharge status: Home / Self Care | Payer: Self-pay | Source: Ambulatory Visit | Attending: Urology

## 2019-12-22 ENCOUNTER — Ambulatory Visit (HOSPITAL_COMMUNITY): Payer: Medicare Other | Admitting: Certified Registered Nurse Anesthetist

## 2019-12-22 DIAGNOSIS — F17210 Nicotine dependence, cigarettes, uncomplicated: Secondary | ICD-10-CM | POA: Diagnosis not present

## 2019-12-22 DIAGNOSIS — N201 Calculus of ureter: Secondary | ICD-10-CM | POA: Insufficient documentation

## 2019-12-22 DIAGNOSIS — Z882 Allergy status to sulfonamides status: Secondary | ICD-10-CM | POA: Insufficient documentation

## 2019-12-22 DIAGNOSIS — Z881 Allergy status to other antibiotic agents status: Secondary | ICD-10-CM | POA: Diagnosis not present

## 2019-12-22 DIAGNOSIS — Z888 Allergy status to other drugs, medicaments and biological substances status: Secondary | ICD-10-CM | POA: Insufficient documentation

## 2019-12-22 DIAGNOSIS — N200 Calculus of kidney: Secondary | ICD-10-CM

## 2019-12-22 HISTORY — PX: CYSTOSCOPY/URETEROSCOPY/HOLMIUM LASER/STENT PLACEMENT: SHX6546

## 2019-12-22 SURGERY — CYSTOSCOPY/URETEROSCOPY/HOLMIUM LASER/STENT PLACEMENT
Anesthesia: General | Laterality: Right

## 2019-12-22 MED ORDER — ONDANSETRON HCL 4 MG/2ML IJ SOLN: 4.0000 mg | Freq: Once | INTRAMUSCULAR | Status: DC | PRN

## 2019-12-22 MED ORDER — ACETAMINOPHEN 10 MG/ML IV SOLN: 1000.0000 mg | Freq: Once | INTRAVENOUS | Status: DC | PRN

## 2019-12-22 MED ORDER — CHLORHEXIDINE GLUCONATE 0.12 % MT SOLN: 15.0000 mL | Freq: Once | OROMUCOSAL | Status: AC

## 2019-12-22 MED ORDER — ORAL CARE MOUTH RINSE
15.0000 mL | Freq: Once | OROMUCOSAL | Status: AC
  Administered 2019-12-22: 15 mL via OROMUCOSAL

## 2019-12-22 MED ORDER — LIDOCAINE HCL URETHRAL/MUCOSAL 2 % EX GEL
CUTANEOUS | Status: DC | PRN
  Administered 2019-12-22: 1 via URETHRAL

## 2019-12-22 MED ORDER — LIDOCAINE HCL URETHRAL/MUCOSAL 2 % EX GEL
CUTANEOUS | Status: AC
  Filled 2019-12-22: qty 30

## 2019-12-22 MED ORDER — DEXAMETHASONE SODIUM PHOSPHATE 10 MG/ML IJ SOLN
INTRAMUSCULAR | Status: DC | PRN
  Administered 2019-12-22: 4 mg via INTRAVENOUS

## 2019-12-22 MED ORDER — PHENYLEPHRINE 40 MCG/ML (10ML) SYRINGE FOR IV PUSH (FOR BLOOD PRESSURE SUPPORT)
PREFILLED_SYRINGE | INTRAVENOUS | Status: AC
  Filled 2019-12-22: qty 10

## 2019-12-22 MED ORDER — SODIUM CHLORIDE 0.9 % IR SOLN
Status: DC | PRN
  Administered 2019-12-22 (×2): 3000 mL

## 2019-12-22 MED ORDER — FENTANYL CITRATE (PF) 100 MCG/2ML IJ SOLN
INTRAMUSCULAR | Status: AC
  Filled 2019-12-22: qty 2

## 2019-12-22 MED ORDER — PROPOFOL 10 MG/ML IV BOLUS
INTRAVENOUS | Status: DC | PRN
  Administered 2019-12-22: 120 mg via INTRAVENOUS

## 2019-12-22 MED ORDER — CEFAZOLIN SODIUM-DEXTROSE 2-3 GM-%(50ML) IV SOLR
INTRAVENOUS | Status: DC | PRN
  Administered 2019-12-22: 2 g via INTRAVENOUS

## 2019-12-22 MED ORDER — HYDROMORPHONE HCL 1 MG/ML IJ SOLN: 0.2500 mg | INTRAMUSCULAR | Status: DC | PRN

## 2019-12-22 MED ORDER — DEXAMETHASONE SODIUM PHOSPHATE 10 MG/ML IJ SOLN
INTRAMUSCULAR | Status: AC
  Filled 2019-12-22: qty 1

## 2019-12-22 MED ORDER — FENTANYL CITRATE (PF) 100 MCG/2ML IJ SOLN
INTRAMUSCULAR | Status: DC | PRN
  Administered 2019-12-22: 50 ug via INTRAVENOUS

## 2019-12-22 MED ORDER — PHENYLEPHRINE 40 MCG/ML (10ML) SYRINGE FOR IV PUSH (FOR BLOOD PRESSURE SUPPORT)
PREFILLED_SYRINGE | INTRAVENOUS | Status: DC | PRN
  Administered 2019-12-22 (×2): 80 ug via INTRAVENOUS
  Administered 2019-12-22: 40 ug via INTRAVENOUS
  Administered 2019-12-22 (×3): 80 ug via INTRAVENOUS

## 2019-12-22 MED ORDER — PHENYLEPHRINE HCL (PRESSORS) 10 MG/ML IV SOLN
INTRAVENOUS | Status: AC
  Filled 2019-12-22: qty 1

## 2019-12-22 MED ORDER — SODIUM CHLORIDE 0.9 % IV SOLN
INTRAVENOUS | Status: DC | PRN
  Administered 2019-12-22: 19 mL

## 2019-12-22 MED ORDER — BELLADONNA ALKALOIDS-OPIUM 16.2-30 MG RE SUPP
RECTAL | Status: AC
  Filled 2019-12-22: qty 1

## 2019-12-22 MED ORDER — LIDOCAINE 2% (20 MG/ML) 5 ML SYRINGE
INTRAMUSCULAR | Status: DC | PRN
  Administered 2019-12-22: 80 mg via INTRAVENOUS

## 2019-12-22 MED ORDER — LIDOCAINE 2% (20 MG/ML) 5 ML SYRINGE
INTRAMUSCULAR | Status: AC
  Filled 2019-12-22: qty 5

## 2019-12-22 MED ORDER — PHENYLEPHRINE HCL-NACL 10-0.9 MG/250ML-% IV SOLN
INTRAVENOUS | Status: DC | PRN
  Administered 2019-12-22: 50 ug/min via INTRAVENOUS

## 2019-12-22 MED ORDER — ONDANSETRON HCL 4 MG/2ML IJ SOLN
INTRAMUSCULAR | Status: DC | PRN
  Administered 2019-12-22: 4 mg via INTRAVENOUS

## 2019-12-22 MED ORDER — LACTATED RINGERS IV SOLN: INTRAVENOUS | Status: DC

## 2019-12-22 MED ORDER — PROPOFOL 10 MG/ML IV BOLUS
INTRAVENOUS | Status: AC
  Filled 2019-12-22: qty 20

## 2019-12-22 MED ORDER — 0.9 % SODIUM CHLORIDE (POUR BTL) OPTIME
TOPICAL | Status: DC | PRN
  Administered 2019-12-22: 1000 mL

## 2019-12-22 MED ORDER — CEFAZOLIN SODIUM-DEXTROSE 2-4 GM/100ML-% IV SOLN
INTRAVENOUS | Status: AC
  Filled 2019-12-22: qty 100

## 2019-12-22 MED ORDER — ONDANSETRON HCL 4 MG/2ML IJ SOLN
INTRAMUSCULAR | Status: AC
  Filled 2019-12-22: qty 2

## 2019-12-22 SURGICAL SUPPLY — 20 items
BAG URO CATCHER STRL LF (MISCELLANEOUS) ×2 IMPLANT
BASKET ZERO TIP NITINOL 2.4FR (BASKET) IMPLANT
BSKT STON RTRVL ZERO TP 2.4FR (BASKET)
CATH URET 5FR 28IN OPEN ENDED (CATHETERS) ×2 IMPLANT
CLOTH BEACON ORANGE TIMEOUT ST (SAFETY) ×2 IMPLANT
EXTRACTOR STONE 1.7FRX115CM (UROLOGICAL SUPPLIES) ×1 IMPLANT
GLOVE BIOGEL M STRL SZ7.5 (GLOVE) ×2 IMPLANT
GOWN STRL REUS W/TWL XL LVL3 (GOWN DISPOSABLE) ×2 IMPLANT
GUIDEWIRE ANG ZIPWIRE 038X150 (WIRE) IMPLANT
GUIDEWIRE STR DUAL SENSOR (WIRE) ×3 IMPLANT
KIT TURNOVER KIT A (KITS) IMPLANT
MANIFOLD NEPTUNE II (INSTRUMENTS) ×2 IMPLANT
PACK CYSTO (CUSTOM PROCEDURE TRAY) ×2 IMPLANT
SHEATH URETERAL 12FRX28CM (UROLOGICAL SUPPLIES) IMPLANT
SHEATH URETERAL 12FRX35CM (MISCELLANEOUS) IMPLANT
STENT URET 6FRX26 CONTOUR (STENTS) ×1 IMPLANT
TRACTIP FLEXIVA PULS ID 200XHI (Laser) IMPLANT
TRACTIP FLEXIVA PULSE ID 200 (Laser) ×2
TUBING CONNECTING 10 (TUBING) ×2 IMPLANT
TUBING UROLOGY SET (TUBING) ×2 IMPLANT

## 2019-12-22 NOTE — Op Note (Signed)
Preoperative diagnosis:  1. Right ureteral stone  Postoperative diagnosis:  1. Same  Procedure: 1. Cystoscopy, retrograde pyelogram right side with interpretation 2. Right ureteroscopy, laser lithotripsy stone extraction 3. Right ureteral stent exchange  Surgeon: Ardis Hughs, MD  Anesthesia: General  Complications: None  Intraoperative findings:  #1: Retrograde pyelogram in the right ureter demonstrated normal caliber ureter with a filling defect in the proximal ureter consistent with the patient's known stone.  There are no additional filling defects or hydronephrosis. #2: The pelvic inlet was a sharp angle and difficult to perform rigid ureteroscopy requiring a flexible ureteroscopic approach for the mid ureteral stone.  EBL: Minimal  Specimens: None  Indication: [name] [name] is a 79 y.o. patient with history of sepsis from an obstructing right ureteral stone, who was previously stented several weeks prior and has been on antibiotics since that time..  After reviewing the management options for treatment, he elected to proceed with the above surgical procedure(s). We have discussed the potential benefits and risks of the procedure, side effects of the proposed treatment, the likelihood of the patient achieving the goals of the procedure, and any potential problems that might occur during the procedure or recuperation. Informed consent has been obtained.  Description of procedure:  The patient was taken to the operating room and general anesthesia was induced.  The patient was placed in the dorsal lithotomy position, prepped and draped in the usual sterile fashion, and preoperative antibiotics were administered. A preoperative time-out was performed.   21 French 30 degree cystoscope was gently passed to the patient's urethra and into the bladder under visual guidance.  The stent was grasped from the right ureteral meatus and brought to the urethral meatus.  A wire was then  advanced up through the stent and up into the right collecting system under under fluoroscopic guidance.  Stent was then removed over the wire and the wire was exchanged with a 5 [place] open-ended ureteral catheter.  Retrograde pyelogram was then performed with the above findings.  The wire was then replaced and the open-ended removed.  I then used a semirigid ureteroscope and advanced it up through the right ureter to the pelvic inlet.  I was unable to advance the rigid scope beyond this area.  This time I tried a needle in the scope and was unable to get any further.  As such I advanced a second wire through the scope and remove the scope over the wire.  I then advanced a flexible ureteroscope over the wire and into the right collecting system.  I then encountered the stone which had been pushed up into the renal pelvis.  Using a stone basket and aggressive stone pulled into the distal ureter and then exchanged the flexible ureteroscope for a semirigid ureteroscope.  I then used a 200 m laser fiber and fragmented the stone into several smaller pieces.  These pieces were then removed with stone basket.  Then reinspected the ureter and noted no additional large stone fragments.  There was some small pieces that were too small to grasp or be removed with the basket.  There was some mild ureteral trauma at the pelvic inlet.  At this point I injected some contrast within the right collecting system to facilitate stent placement and remove the scope.  I then backloaded the wire through the cystoscope and gentle cystoscope back into the bladder under visual guidance.  I advanced a 26 cm time 6 French double-J ureteral stent over the wire and  into the right renal pelvis under fluoroscopic guidance.  Once the stent was noted to be well positioned within the renal pelvis was advanced to the bladder neck before the wire was completely removed.  A curl was noted to be nicely within the bladder.  I then emptied the bladder  and the stone fragments were to be sent for stone analysis.  I instilled lidocaine jelly into the patient's urethra.  He was subsequently extubated to the PACU in stable condition.  Disposition: The patient will be scheduled for stent removal in 1 week as an outpatient.  Ardis Hughs, M.D.

## 2019-12-22 NOTE — Interval H&P Note (Signed)
History and Physical Interval Note:  12/22/2019 7:19 AM  [name] [name]  has presented today for surgery, with the diagnosis of RIGHT URETERAL STONE.  The various methods of treatment have been discussed with the patient and family. After consideration of risks, benefits and other options for treatment, the patient has consented to  Procedure(s): CYSTOSCOPY RIGHT URETEROSCOPY/HOLMIUM LASER/STENT PLACEMENT (Right) as a surgical intervention.  The patient's history has been reviewed, patient examined, no change in status, stable for surgery.  I have reviewed the patient's chart and labs.  Questions were answered to the patient's satisfaction.     Ardis Hughs

## 2019-12-22 NOTE — H&P (Signed)
H&P Physician requesting consult: Lennice Sites  Chief Complaint: Right ureteral stone  History of Present Illness: 79 year old male with multiple medical comorbidities presented with confusion.  He was noted to have a fever of 100.8, mild leukocytosis and a CT of the abdomen showed a 5 mm obstructing right ureteral stone.  Patient denies any pain.  Denies a previous history of stones.      Past Medical History:  Diagnosis Date  . Adenomatous polyp 2007  . Arthritis    "my body"  . B12 deficiency   . BPH (benign prostatic hyperplasia)   . Chronic back pain    "all over"  . Chronic low back pain   . Coronary artery disease 2009  . DDD (degenerative disc disease), cervical   . DDD (degenerative disc disease), lumbosacral   . DDD (degenerative disc disease), thoracic   . Depression   . Diabetes mellitus without complication ([place])    Type II  . Gastroesophageal reflux   . Gynecomastia 06/2009  . Hyperglycemia   . Hyperlipidemia LDL goal < 70   . MI (myocardial infarction) ([place]) 08/1989; 1993  . OSA on CPAP    "have mask; don't wear it" (02/02/2014) 09/25/15- now has a BiPAp- doesn'tt use it  . Peptic ulcer disease 1959  . Right bundle branch block   . Shortness of breath dyspnea   . Tobacco abuse   . Vertebral compression fracture ([place]) 1970        Past Surgical History:  Procedure Laterality Date  . BYPASS GRAFT POPLITEAL TO POPLITEAL Left 06/2005   Dr Donnetta Hutching  . BYPASS GRAFT POPLITEAL TO POPLITEAL Right 02/2001   Dr Deon Pilling  . CARDIAC CATHETERIZATION  1991; 1993; 08/2012; 11/2013; 02/02/2014   I've had 6-7 total  . CARDIAC CATHETERIZATION  02/02/2014   Procedure: LEFT HEART CATH AND CORONARY ANGIOGRAPHY;  Surgeon: Jettie Booze, MD;  mild disease in LAD/CFX, RCA w/ CTO, unsuccessfull PCI both w/ antegrade and retrograde approaches   . CARPAL TUNNEL RELEASE Left 06/01/2019   Procedure: LEFT CARPAL TUNNEL RELEASE;  Surgeon: Daryll Brod, MD;   Location: [facility];  Service: Orthopedics;  Laterality: Left;  FOREARM BLOCK  . CATARACT EXTRACTION W/ INTRAOCULAR LENS  IMPLANT, BILATERAL Bilateral ~ 2012  . COLONOSCOPY W/ POLYPECTOMY    . CORONARY ANGIOPLASTY  1990's  . ESOPHAGOGASTRODUODENOSCOPY (EGD) WITH ESOPHAGEAL DILATION  11/2007  . ESOPHAGOGASTRODUODENOSCOPY (EGD) WITH PROPOFOL N/A 09/28/2015   Procedure: ESOPHAGOGASTRODUODENOSCOPY (EGD) WITH PROPOFOL;  Surgeon: Clarene Essex, MD;  Location[facility] ENDOSCOPY;  Service: Endoscopy;  Laterality: N/A;  . ESOPHAGOGASTRODUODENOSCOPY (EGD) WITH PROPOFOL N/A 09/09/2017   Procedure: ESOPHAGOGASTRODUODENOSCOPY (EGD) WITH PROPOFOL;  Surgeon: Clarene Essex, MD;  Location: [facility] ENDOSCOPY;  Service: Endoscopy;  Laterality: N/A;  . EYE SURGERY Bilateral   . JOINT REPLACEMENT    . LEFT HEART CATH AND CORONARY ANGIOGRAPHY N/A 08/10/2018   Procedure: LEFT HEART CATH AND CORONARY ANGIOGRAPHY;  Surgeon: Troy Sine, MD;  Location: [facility] CV LAB;  Service: Cardiovascular;  Laterality: N/A;  . LEFT HEART CATHETERIZATION WITH CORONARY ANGIOGRAM N/A 09/03/2012   Procedure: LEFT HEART CATHETERIZATION WITH CORONARY ANGIOGRAM;  Surgeon: Sinclair Grooms, MD;  Location: [facility] CATH LAB;  Service: Cardiovascular;  Laterality: N/A;  . LEFT HEART CATHETERIZATION WITH CORONARY ANGIOGRAM N/A 12/01/2013   Procedure: LEFT HEART CATHETERIZATION WITH CORONARY ANGIOGRAM;  Surgeon: Sinclair Grooms, MD;  Location: [facility] CATH LAB;  Service: Cardiovascular;  Laterality: N/A;  . LOWER EXTREMITY ANGIOGRAM Left 05/2005  L pop-pop BPG patent  . NAILBED REPAIR Right 09/17/2016   Procedure: ABLATION NAILBED RIGHT INDEX;  Surgeon: Daryll Brod, MD;  Location: [facility];  Service: Orthopedics;  Laterality: Right;  . NASAL SEPTUM SURGERY  1977  . PENILE PROSTHESIS IMPLANT  1990's  . SAVORY DILATION N/A 09/28/2015   Procedure: SAVORY DILATION;  Surgeon: Clarene Essex, MD;  Location: [facility] ENDOSCOPY;   Service: Endoscopy;  Laterality: N/A;  have balloons available  . SAVORY DILATION N/A 09/09/2017   Procedure: SAVORY DILATION;  Surgeon: Clarene Essex, MD;  Location: [facility] ENDOSCOPY;  Service: Endoscopy;  Laterality: N/A;  . SCROTAL SURGERY  08/1999   pump revision/notes 07/11/2010  . SKIN FULL THICKNESS GRAFT Right 09/17/2016   Procedure: SKIN GRAFT FROM UPPER ARM;  Surgeon: Daryll Brod, MD;  Location: [facility];  Service: Orthopedics;  Laterality: Right;  . TOTAL KNEE ARTHROPLASTY Left 01/2009    Home Medications:  (Not in a hospital admission)  Allergies:       Allergies  Allergen Reactions  . Doxycycline Other (See Comments)    Dizziness  . Flomax [Tamsulosin] Other (See Comments)    Dizziness   . Lipitor [Atorvastatin] Other (See Comments)    Muscle weakness  . Rhubarb Other (See Comments)    From childhood- exact reaction not recalled, but is allergic  . Sulfa Antibiotics Hives  . Sulfacetamide Sodium Hives  . Clindamycin/Lincomycin Rash  . Other Hives, Itching, Rash and Other (See Comments)    UNNAMED ANTIBIOTIC- SEVERE SYMPTOMS  [place]- From childhood- exact reaction not recalled, but is allergic  . Vytorin [Ezetimibe-Simvastatin] Other (See Comments)    Bodyaches         Family History  Problem Relation Age of Onset  . Diabetes Father 79       deceased  . Peripheral vascular disease Father   . Pneumonia Father   . Alzheimer's disease Mother 31       deceased  . Fibromyalgia Sister 84       deceased  . Hypertension Son   . Obesity Daughter   . Diabetes Daughter    Social History:  reports that he has been smoking cigarettes. He has a 14.75 pack-year smoking history. He has quit using smokeless tobacco.  His smokeless tobacco use included chew. He reports that he does not drink alcohol and does not use drugs.  ROS: A complete review of systems was performed.  All systems are negative except for pertinent findings  as noted. ROS   Physical Exam:  Vital signs in last 24 hours: Temp:  [100 F (37.8 C)-100.8 F (38.2 C)] 100 F (37.8 C) (09/24 1208) Pulse Rate:  [71-88] 77 (09/24 1815) Resp:  [17-24] 21 (09/24 1815) BP: (79-141)/(47-73) 141/73 (09/24 1815) SpO2:  [90 %-96 %] 95 % (09/24 1815) Weight:  [90.7 kg] 90.7 kg (09/24 1249) General:  Alert and oriented, No acute distress HEENT: Normocephalic, atraumatic Neck: No JVD or lymphadenopathy Cardiovascular: Regular rate and rhythm Lungs: Regular rate and effort Abdomen: Soft, nontender, nondistended, no abdominal masses Back: No CVA tenderness Extremities: No edema Neurologic: Grossly intact  Laboratory Data:  Lab Results Last 24 Hours       Results for orders placed or performed during the hospital encounter of 11/19/19 (from the past 24 hour(s))  Comprehensive metabolic panel     Status: Abnormal   Collection Time: 11/19/19 11:30 AM  Result Value Ref Range   Sodium 132 (L) 135 - 145 mmol/L  Potassium 3.9 3.5 - 5.1 mmol/L   Chloride 96 (L) 98 - 111 mmol/L   CO2 26 22 - 32 mmol/L   Glucose, Bld 103 (H) 70 - 99 mg/dL   BUN 29 (H) 8 - 23 mg/dL   Creatinine, Ser 1.61 (H) 0.61 - 1.24 mg/dL   Calcium 9.1 8.9 - 10.3 mg/dL   Total Protein 6.2 (L) 6.5 - 8.1 g/dL   Albumin 2.9 (L) 3.5 - 5.0 g/dL   AST 19 15 - 41 U/L   ALT 16 0 - 44 U/L   Alkaline Phosphatase 62 38 - 126 U/L   Total Bilirubin 1.1 0.3 - 1.2 mg/dL   GFR calc non Af Amer 40 (L) >60 mL/min   GFR calc Af Amer 46 (L) >60 mL/min   Anion gap 10 5 - 15  CBC     Status: Abnormal   Collection Time: 11/19/19 11:30 AM  Result Value Ref Range   WBC 13.2 (H) 4.0 - 10.5 K/uL   RBC 3.90 (L) 4.22 - 5.81 MIL/uL   Hemoglobin 12.8 (L) 13.0 - 17.0 g/dL   HCT 39.7 39 - 52 %   MCV 101.8 (H) 80.0 - 100.0 fL   MCH 32.8 26.0 - 34.0 pg   MCHC 32.2 30.0 - 36.0 g/dL   RDW 13.6 11.5 - 15.5 %   Platelets 150 150 - 400 K/uL   nRBC 0.0 0.0 - 0.2 %  CBC with  Differential/Platelet     Status: Abnormal   Collection Time: 11/19/19 11:30 AM  Result Value Ref Range   WBC 13.3 (H) 4.0 - 10.5 K/uL   RBC 3.89 (L) 4.22 - 5.81 MIL/uL   Hemoglobin 12.9 (L) 13.0 - 17.0 g/dL   HCT 39.7 39 - 52 %   MCV 102.1 (H) 80.0 - 100.0 fL   MCH 33.2 26.0 - 34.0 pg   MCHC 32.5 30.0 - 36.0 g/dL   RDW 13.7 11.5 - 15.5 %   Platelets 163 150 - 400 K/uL   nRBC 0.0 0.0 - 0.2 %   Neutrophils Relative % 92 %   Neutro Abs 12.2 (H) 1.7 - 7.7 K/uL   Lymphocytes Relative 4 %   Lymphs Abs 0.5 (L) 0.7 - 4.0 K/uL   Monocytes Relative 4 %   Monocytes Absolute 0.5 0 - 1 K/uL   Eosinophils Relative 0 %   Eosinophils Absolute 0.0 0 - 0 K/uL   Basophils Relative 0 %   Basophils Absolute 0.0 0 - 0 K/uL   nRBC 0 0 /100 WBC   Abs Immature Granulocytes 0.00 0.00 - 0.07 K/uL  Protime-INR     Status: None   Collection Time: 11/19/19 12:18 PM  Result Value Ref Range   Prothrombin Time 14.9 11.4 - 15.2 seconds   INR 1.2 0.8 - 1.2  APTT     Status: None   Collection Time: 11/19/19 12:18 PM  Result Value Ref Range   aPTT 30 24 - 36 seconds  Lactic acid, plasma     Status: Abnormal   Collection Time: 11/19/19 12:36 PM  Result Value Ref Range   Lactic Acid, Venous 2.2 (HH) 0.5 - 1.9 mmol/L  Urinalysis, Routine w reflex microscopic Urine, Clean Catch     Status: Abnormal   Collection Time: 11/19/19  2:27 PM  Result Value Ref Range   Color, Urine AMBER (A) YELLOW   APPearance HAZY (A) CLEAR   Specific Gravity, Urine 1.009 1.005 - 1.030   pH 5.0 5.0 -  8.0   Glucose, UA NEGATIVE NEGATIVE mg/dL   Hgb urine dipstick LARGE (A) NEGATIVE   Bilirubin Urine NEGATIVE NEGATIVE   Ketones, ur NEGATIVE NEGATIVE mg/dL   Protein, ur 30 (A) NEGATIVE mg/dL   Nitrite NEGATIVE NEGATIVE   Leukocytes,Ua LARGE (A) NEGATIVE   RBC / HPF 21-50 0 - 5 RBC/hpf   WBC, UA >50 (H) 0 - 5 WBC/hpf   Bacteria, UA RARE (A) NONE SEEN   Squamous Epithelial / LPF 0-5 0  - 5   Mucus PRESENT   Lactic acid, plasma     Status: None   Collection Time: 11/19/19  4:07 PM  Result Value Ref Range   Lactic Acid, Venous 1.8 0.5 - 1.9 mmol/L            Recent Results (from the past 240 hour(s))  SARS Coronavirus 2 by RT PCR (hospital order, performed in [place] hospital lab) Nasopharyngeal Nasopharyngeal Swab     Status: Abnormal   Collection Time: 11/16/19  7:20 PM   Specimen: Nasopharyngeal Swab  Result Value Ref Range Status   SARS Coronavirus 2 POSITIVE (A) NEGATIVE Final    Comment: RESULT CALLED TO, READ BACK BY AND VERIFIED WITH: D,SIDBURY @2158  11/16/19 EB (NOTE) SARS-CoV-2 target nucleic acids are DETECTED  SARS-CoV-2 RNA is generally detectable in upper respiratory specimens  during the acute phase of infection.  Positive results are indicative  of the presence of the identified virus, but do not rule out bacterial infection or co-infection with other pathogens not detected by the test.  Clinical correlation with patient history and  other diagnostic information is necessary to determine patient infection status.  The expected result is negative.  Fact Sheet for Patients:   StrictlyIdeas.no              Fact Sheet for Healthcare Providers:   BankingDealers.co.za               This test is not yet approved or cleared by the [place] FDA and  has been authorized for detection and/or diagnosis of SARS-CoV-2 by FDA under an Emergency Use Authorization (EUA).  This EUA will remain in effect (meaning this test can b e used) for the duration of  the COVID-19 declaration under Section 564(b)(1) of the Act, 21 U.S.C. section 360-bbb-3(b)(1), unless the authorization is terminated or revoked sooner.  Performed at [facility], [place] [address] [place], [place] [address]    Creatinine:     Recent Labs    11/16/19 1135 11/19/19 1130  CREATININE 0.75 1.61*   CT scan  personally reviewed and is detailed in history of present illness  Impression/Assessment:  Right ureteral stone Sepsis secondary to UTI  Plan:  Proceed with urgent right ureteral stent placement.  Risk and benefits discussed.  Marton Redwood, III

## 2019-12-22 NOTE — Anesthesia Preprocedure Evaluation (Addendum)
Anesthesia Evaluation  Patient identified by MRN, date of birth, ID band Patient awake    Airway Mallampati: II  TM Distance: >3 FB     Dental  (+) Edentulous Upper, Edentulous Lower   Pulmonary sleep apnea and Continuous Positive Airway Pressure Ventilation , COPD, Current Smoker and Patient abstained from smoking.,    Pulmonary exam normal        Cardiovascular hypertension, + CAD and + Past MI  + dysrhythmias  Rhythm:Regular Rate:Normal  RBBB   Neuro/Psych Anxiety Depression    GI/Hepatic Neg liver ROS, PUD, GERD  Medicated,  Endo/Other    Renal/GU Renal diseaseUreteral stones     Musculoskeletal  (+) Arthritis , DDD thoracic   Abdominal (+)  Abdomen: soft. Bowel sounds: normal.  Peds  Hematology   Anesthesia Other Findings   Reproductive/Obstetrics                            Anesthesia Physical Anesthesia Plan  ASA: III  Anesthesia Plan: General   Post-op Pain Management:    Induction: Intravenous  PONV Risk Score and Plan: 1 and Ondansetron, Dexamethasone and Treatment may vary due to age or medical condition  Airway Management Planned: Mask and LMA  Additional Equipment: None  Intra-op Plan:   Post-operative Plan: Extubation in OR  Informed Consent: I have reviewed the patients History and Physical, chart, labs and discussed the procedure including the risks, benefits and alternatives for the proposed anesthesia with the patient or authorized representative who has indicated his/her understanding and acceptance.     Dental advisory given  Plan Discussed with: CRNA  Anesthesia Plan Comments: (ECHO 09/21: Left ventricular ejection fraction, by estimation, is 50 to 55%. The left ventricle has low normal function. The left ventricle has no regional wall motion abnormalities. Left ventricular diastolic parameters are indeterminate. 2. Right ventricular systolic function  is normal. The right ventricular size is normal. 3. The mitral valve is normal in structure. No evidence of mitral valve regurgitation. No evidence of mitral stenosis. 4. The aortic valve is tricuspid. Aortic valve regurgitation is not visualized. No aortic stenosis is present.)        Anesthesia Quick Evaluation

## 2019-12-22 NOTE — Transfer of Care (Signed)
Immediate Anesthesia Transfer of Care Note  Patient: [name] [name]  Procedure(s) Performed: CYSTOSCOPY RIGHT URETEROSCOPY/HOLMIUM LASER/STENT PLACEMENT (Right )  Patient Location: PACU  Anesthesia Type:General  Level of Consciousness: drowsy and patient cooperative  Airway & Oxygen Therapy: Patient Spontanous Breathing and Patient connected to face mask oxygen  Post-op Assessment: Report given to RN and Post -op Vital signs reviewed and stable  Post vital signs: Reviewed and stable  Last Vitals:  Vitals Value Taken Time  BP 126/66 12/22/19 0900  Temp    Pulse 68 12/22/19 0903  Resp 21 12/22/19 0903  SpO2 100 % 12/22/19 0903  Vitals shown include unvalidated device data.  Last Pain:  Vitals:   12/22/19 0620  TempSrc: Oral         Complications: No complications documented.

## 2019-12-22 NOTE — Discharge Instructions (Signed)
DISCHARGE INSTRUCTIONS FOR KIDNEY STONE/URETERAL STENT   MEDICATIONS:  1. Resume all your other meds from home - except do not take any extra narcotic pain meds that you may have at home.  2. Continue antibiotics until the prescription is complete   ACTIVITY:  1. No strenuous activity x 1week  2. No driving while on narcotic pain medications  3. Drink plenty of water  4. Continue to walk at home - you can still get blood clots when you are at home, so keep active, but don't over do it.  5. May return to work/school tomorrow or when you feel ready   BATHING:  1. You can shower and we recommend daily showers   SIGNS/SYMPTOMS TO CALL:  Please call us if you have a fever greater than 101.5, uncontrolled nausea/vomiting, uncontrolled pain, dizziness, unable to urinate, bloody urine, chest pain, shortness of breath, leg swelling, leg pain, redness around wound, drainage from wound, or any other concerns or questions.   You can reach [place] at [phone].   FOLLOW-UP:  1. You have an appointment for stent removal in 1 week.

## 2019-12-22 NOTE — Anesthesia Procedure Notes (Signed)
Procedure Name: LMA Insertion Date/Time: 12/22/2019 7:38 AM Performed by: Montel Clock, CRNA Pre-anesthesia Checklist: Patient identified, Emergency Drugs available, Suction available, Patient being monitored and Timeout performed Patient Re-evaluated:Patient Re-evaluated prior to induction Oxygen Delivery Method: Circle system utilized Preoxygenation: Pre-oxygenation with 100% oxygen Induction Type: IV induction LMA: LMA with gastric port inserted LMA Size: 4.0 Number of attempts: 1 Dental Injury: Teeth and Oropharynx as per pre-operative assessment

## 2019-12-22 NOTE — Anesthesia Postprocedure Evaluation (Signed)
Anesthesia Post Note  Patient: [name] [name]  Procedure(s) Performed: CYSTOSCOPY RIGHT URETEROSCOPY/HOLMIUM LASER/STENT PLACEMENT (Right )     Patient location during evaluation: PACU Anesthesia Type: General Level of consciousness: awake and alert Pain management: pain level controlled Vital Signs Assessment: post-procedure vital signs reviewed and stable Respiratory status: spontaneous breathing, nonlabored ventilation, respiratory function stable and patient connected to nasal cannula oxygen Cardiovascular status: blood pressure returned to baseline and stable Postop Assessment: no apparent nausea or vomiting Anesthetic complications: no   No complications documented.  Last Vitals:  Vitals:   12/22/19 0930 12/22/19 0945  BP:  (!) 148/64  Pulse: 68 64  Resp: 15 11  Temp:    SpO2: 96% 97%    Last Pain:  Vitals:   12/22/19 0945  TempSrc:   PainSc: 0-No pain                 Belenda Cruise P Doriana Mazurkiewicz

## 2019-12-23 ENCOUNTER — Encounter (HOSPITAL_COMMUNITY): Payer: Self-pay | Admitting: Urology

## 2020-02-23 ENCOUNTER — Other Ambulatory Visit: Payer: Self-pay | Admitting: Gastroenterology

## 2020-02-23 ENCOUNTER — Other Ambulatory Visit: Payer: Self-pay | Admitting: Physician Assistant

## 2020-02-23 DIAGNOSIS — R1310 Dysphagia, unspecified: Secondary | ICD-10-CM

## 2020-03-06 ENCOUNTER — Ambulatory Visit
Admission: RE | Admit: 2020-03-06 | Discharge: 2020-03-06 | Disposition: A | Discharge status: Home / Self Care | Payer: Medicare Other | Source: Ambulatory Visit | Attending: Physician Assistant | Admitting: Physician Assistant

## 2020-03-06 ENCOUNTER — Other Ambulatory Visit (HOSPITAL_COMMUNITY)
Admission: RE | Admit: 2020-03-06 | Discharge: 2020-03-06 | Disposition: A | Discharge status: Home / Self Care | Payer: Medicare Other | Source: Ambulatory Visit | Attending: Gastroenterology | Admitting: Gastroenterology

## 2020-03-06 DIAGNOSIS — Z01812 Encounter for preprocedural laboratory examination: Secondary | ICD-10-CM | POA: Diagnosis not present

## 2020-03-06 DIAGNOSIS — Z20822 Contact with and (suspected) exposure to covid-19: Secondary | ICD-10-CM | POA: Insufficient documentation

## 2020-03-06 DIAGNOSIS — R1310 Dysphagia, unspecified: Secondary | ICD-10-CM

## 2020-03-06 DIAGNOSIS — K224 Dyskinesia of esophagus: Secondary | ICD-10-CM | POA: Diagnosis not present

## 2020-03-06 LAB — SARS CORONAVIRUS 2 (TAT 6-24 HRS): SARS Coronavirus 2: NEGATIVE

## 2020-03-08 ENCOUNTER — Other Ambulatory Visit: Payer: Self-pay | Admitting: Physician Assistant

## 2020-03-08 ENCOUNTER — Ambulatory Visit
Admission: RE | Admit: 2020-03-08 | Discharge: 2020-03-08 | Disposition: A | Discharge status: Home / Self Care | Payer: Medicare Other | Source: Ambulatory Visit | Attending: Physician Assistant | Admitting: Physician Assistant

## 2020-03-08 DIAGNOSIS — R059 Cough, unspecified: Secondary | ICD-10-CM

## 2020-03-09 ENCOUNTER — Ambulatory Visit (HOSPITAL_COMMUNITY)
Admission: RE | Admit: 2020-03-09 | Discharge: 2020-03-09 | Disposition: A | Discharge status: Home / Self Care | Payer: Medicare Other | Attending: Gastroenterology | Admitting: Gastroenterology

## 2020-03-09 ENCOUNTER — Other Ambulatory Visit: Payer: Self-pay

## 2020-03-09 ENCOUNTER — Encounter (HOSPITAL_COMMUNITY): Payer: Self-pay | Admitting: Gastroenterology

## 2020-03-09 ENCOUNTER — Ambulatory Visit (HOSPITAL_COMMUNITY): Payer: Medicare Other | Admitting: Registered Nurse

## 2020-03-09 ENCOUNTER — Encounter (HOSPITAL_COMMUNITY)
Admission: RE | Disposition: A | Discharge status: Home / Self Care | Payer: Self-pay | Source: Home / Self Care | Attending: Gastroenterology

## 2020-03-09 DIAGNOSIS — Z882 Allergy status to sulfonamides status: Secondary | ICD-10-CM | POA: Insufficient documentation

## 2020-03-09 DIAGNOSIS — I110 Hypertensive heart disease with heart failure: Secondary | ICD-10-CM | POA: Diagnosis not present

## 2020-03-09 DIAGNOSIS — Z7982 Long term (current) use of aspirin: Secondary | ICD-10-CM | POA: Insufficient documentation

## 2020-03-09 DIAGNOSIS — G4730 Sleep apnea, unspecified: Secondary | ICD-10-CM | POA: Diagnosis not present

## 2020-03-09 DIAGNOSIS — Z881 Allergy status to other antibiotic agents status: Secondary | ICD-10-CM | POA: Insufficient documentation

## 2020-03-09 DIAGNOSIS — K219 Gastro-esophageal reflux disease without esophagitis: Secondary | ICD-10-CM | POA: Diagnosis not present

## 2020-03-09 DIAGNOSIS — Z79899 Other long term (current) drug therapy: Secondary | ICD-10-CM | POA: Insufficient documentation

## 2020-03-09 DIAGNOSIS — Z888 Allergy status to other drugs, medicaments and biological substances status: Secondary | ICD-10-CM | POA: Diagnosis not present

## 2020-03-09 DIAGNOSIS — F17200 Nicotine dependence, unspecified, uncomplicated: Secondary | ICD-10-CM | POA: Diagnosis not present

## 2020-03-09 DIAGNOSIS — K222 Esophageal obstruction: Secondary | ICD-10-CM | POA: Insufficient documentation

## 2020-03-09 DIAGNOSIS — E1140 Type 2 diabetes mellitus with diabetic neuropathy, unspecified: Secondary | ICD-10-CM | POA: Insufficient documentation

## 2020-03-09 DIAGNOSIS — K449 Diaphragmatic hernia without obstruction or gangrene: Secondary | ICD-10-CM | POA: Insufficient documentation

## 2020-03-09 DIAGNOSIS — Z8249 Family history of ischemic heart disease and other diseases of the circulatory system: Secondary | ICD-10-CM | POA: Insufficient documentation

## 2020-03-09 DIAGNOSIS — R1310 Dysphagia, unspecified: Secondary | ICD-10-CM | POA: Diagnosis not present

## 2020-03-09 DIAGNOSIS — Z833 Family history of diabetes mellitus: Secondary | ICD-10-CM | POA: Diagnosis not present

## 2020-03-09 DIAGNOSIS — Z96652 Presence of left artificial knee joint: Secondary | ICD-10-CM | POA: Diagnosis not present

## 2020-03-09 DIAGNOSIS — I252 Old myocardial infarction: Secondary | ICD-10-CM | POA: Insufficient documentation

## 2020-03-09 DIAGNOSIS — I5022 Chronic systolic (congestive) heart failure: Secondary | ICD-10-CM | POA: Diagnosis not present

## 2020-03-09 DIAGNOSIS — E7800 Pure hypercholesterolemia, unspecified: Secondary | ICD-10-CM | POA: Diagnosis not present

## 2020-03-09 DIAGNOSIS — I25110 Atherosclerotic heart disease of native coronary artery with unstable angina pectoris: Secondary | ICD-10-CM | POA: Diagnosis not present

## 2020-03-09 DIAGNOSIS — I25119 Atherosclerotic heart disease of native coronary artery with unspecified angina pectoris: Secondary | ICD-10-CM | POA: Diagnosis not present

## 2020-03-09 DIAGNOSIS — Z8711 Personal history of peptic ulcer disease: Secondary | ICD-10-CM | POA: Diagnosis not present

## 2020-03-09 HISTORY — PX: SAVORY DILATION: SHX5439

## 2020-03-09 HISTORY — PX: ESOPHAGOGASTRODUODENOSCOPY (EGD) WITH PROPOFOL: SHX5813

## 2020-03-09 SURGERY — ESOPHAGOGASTRODUODENOSCOPY (EGD) WITH PROPOFOL
Anesthesia: Monitor Anesthesia Care

## 2020-03-09 MED ORDER — PROPOFOL 10 MG/ML IV BOLUS
INTRAVENOUS | Status: DC | PRN
  Administered 2020-03-09: 10 mg via INTRAVENOUS

## 2020-03-09 MED ORDER — PROPOFOL 1000 MG/100ML IV EMUL
INTRAVENOUS | Status: AC
  Filled 2020-03-09: qty 100

## 2020-03-09 MED ORDER — LACTATED RINGERS IV SOLN: INTRAVENOUS | Status: DC | PRN

## 2020-03-09 MED ORDER — LIDOCAINE 2% (20 MG/ML) 5 ML SYRINGE
INTRAMUSCULAR | Status: DC | PRN
  Administered 2020-03-09: 50 mg via INTRAVENOUS

## 2020-03-09 MED ORDER — PROPOFOL 500 MG/50ML IV EMUL
INTRAVENOUS | Status: AC
  Filled 2020-03-09: qty 50

## 2020-03-09 MED ORDER — PROPOFOL 500 MG/50ML IV EMUL
INTRAVENOUS | Status: DC | PRN
  Administered 2020-03-09: 130 ug/kg/min via INTRAVENOUS

## 2020-03-09 MED ORDER — EPHEDRINE SULFATE-NACL 50-0.9 MG/10ML-% IV SOSY
PREFILLED_SYRINGE | INTRAVENOUS | Status: DC | PRN
  Administered 2020-03-09: 10 mg via INTRAVENOUS
  Administered 2020-03-09 (×2): 5 mg via INTRAVENOUS

## 2020-03-09 MED ORDER — SODIUM CHLORIDE 0.9 % IV SOLN: INTRAVENOUS | Status: DC

## 2020-03-09 MED ORDER — LACTATED RINGERS IV SOLN: Freq: Once | INTRAVENOUS | Status: AC

## 2020-03-09 SURGICAL SUPPLY — 15 items

## 2020-03-09 NOTE — Progress Notes (Signed)
[name] 8:33 AM  Subjective: Patient with continued midepigastric dysphagia mostly if he eats a big meal swallows liquids fine no other new medical complaints since I saw him recently in the office and his barium swallow was reviewed  Objective: Vital signs stable afebrile exam please see preassessment evaluation  Assessment: Dysphagia abnormal barium swallow  Plan: Okay to proceed with endoscopy and probable dilation with radiology assistance  [facility]  office [phone] After 5PM or if no answer call [phone]

## 2020-03-09 NOTE — Anesthesia Preprocedure Evaluation (Addendum)
Anesthesia Evaluation  Patient identified by MRN, date of birth, ID band Patient awake    Reviewed: Allergy & Precautions, NPO status , Patient's Chart, lab work & pertinent test results, reviewed documented beta blocker date and time   Airway Mallampati: II  TM Distance: >3 FB Neck ROM: Full    Dental  (+) Edentulous Upper, Edentulous Lower   Pulmonary sleep apnea (does not use his BiPAP) , COPD, Current Smoker and Patient abstained from smoking.,    Pulmonary exam normal breath sounds clear to auscultation       Cardiovascular hypertension, Pt. on home beta blockers and Pt. 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  There is an 80% stenosis in the continuation branch between the PDA and PLA takeoff.  Mild nonobstructive disease involving the LAD with 30% stenosis after the second diagonal vessel and normal left circumflex coronary artery.  Low normal LV function with EF estimated approximately 50%.  There is a focal area of inferobasilar akinesis.  The EDP is 18 mm Hg    Neuro/Psych PSYCHIATRIC DISORDERS Anxiety Depression negative neurological ROS     GI/Hepatic Neg liver ROS, PUD, GERD  Medicated and Controlled,  Endo/Other  negative endocrine ROS  Renal/GU negative Renal ROS  negative genitourinary   Musculoskeletal  (+) Arthritis ,   Abdominal   Peds  Hematology negative hematology ROS (+)   Anesthesia Other Findings   Reproductive/Obstetrics                           Anesthesia Physical Anesthesia Plan  ASA: III  Anesthesia Plan:  MAC   Post-op Pain Management:    Induction: Intravenous  PONV Risk Score and Plan: Propofol infusion and Treatment may vary due to age or medical condition  Airway Management Planned: Natural Airway  Additional Equipment:   Intra-op Plan:   Post-operative Plan:   Informed Consent: I have reviewed the patients History and Physical, chart, labs and discussed the procedure including the risks, benefits and alternatives for the proposed anesthesia with the patient or authorized representative who has indicated his/her understanding and acceptance.     Dental advisory given  Plan Discussed with: CRNA  Anesthesia Plan Comments:         Anesthesia Quick Evaluation

## 2020-03-09 NOTE — Anesthesia Postprocedure Evaluation (Signed)
Anesthesia Post Note  Patient: [name] [name]  Procedure(s) Performed: ESOPHAGOGASTRODUODENOSCOPY (EGD) WITH PROPOFOL WITH DIL (N/A ) SAVORY DILATION (N/A )     Patient location during evaluation: Endoscopy Anesthesia Type: MAC Level of consciousness: awake and alert Pain management: pain level controlled Vital Signs Assessment: post-procedure vital signs reviewed and stable Respiratory status: spontaneous breathing, nonlabored ventilation, respiratory function stable and patient connected to nasal cannula oxygen Cardiovascular status: blood pressure returned to baseline and stable Postop Assessment: no apparent nausea or vomiting Anesthetic complications: no   No complications documented.  Last Vitals:  Vitals:   03/09/20 0920 03/09/20 0930  BP: (!) 122/58 123/62  Pulse: (!) 55 (!) 55  Resp: (!) 22 19  Temp:    SpO2: 95% 96%    Last Pain:  Vitals:   03/09/20 0930  TempSrc:   PainSc: 0-No pain                 Amiri Riechers L Pattricia Weiher

## 2020-03-09 NOTE — Discharge Instructions (Signed)
Call if question or problem or if swallowing no better in 2-week otherwise follow-up in 1 to 2 months and begin today with soft solids like breakfast food and slowly advance your diet  YOU HAD AN ENDOSCOPIC PROCEDURE TODAY: Refer to the procedure report and other information in the discharge instructions given to you for any specific questions about what was found during the examination. If this information does not answer your questions, please call [facility] office at [phone] to clarify.   YOU SHOULD EXPECT: Some feelings of bloating in the abdomen. Passage of more gas than usual. Walking can help get rid of the air that was put into your GI tract during the procedure and reduce the bloating. If you had a lower endoscopy (such as a colonoscopy or flexible sigmoidoscopy) you may notice spotting of blood in your stool or on the toilet paper. Some abdominal soreness may be present for a day or two, also.  DIET: Your first meal following the procedure should be a light meal and then it is ok to progress to your normal diet. A half-sandwich or bowl of soup is an example of a good first meal. Heavy or fried foods are harder to digest and may make you feel nauseous or bloated. Drink plenty of fluids but you should avoid alcoholic beverages for 24 hours. If you had a esophageal dilation, please see attached instructions for diet.    ACTIVITY: Your care partner should take you home directly after the procedure. You should plan to take it easy, moving slowly for the rest of the day. You can resume normal activity the day after the procedure however YOU SHOULD NOT DRIVE, use power tools, machinery or perform tasks that involve climbing or major physical exertion for 24 hours (because of the sedation medicines used during the test).   SYMPTOMS TO REPORT IMMEDIATELY: A gastroenterologist can be reached at any hour. Please call [phone]  for any of the following symptoms:  . Following lower endoscopy  (colonoscopy, flexible sigmoidoscopy) Excessive amounts of blood in the stool  Significant tenderness, worsening of abdominal pains  Swelling of the abdomen that is new, acute  Fever of 100 or higher  . Following upper endoscopy (EGD, EUS, ERCP, esophageal dilation) Vomiting of blood or coffee ground material  New, significant abdominal pain  New, significant chest pain or pain under the shoulder blades  Painful or persistently difficult swallowing  New shortness of breath  Black, tarry-looking or red, bloody stools  FOLLOW UP:  If any biopsies were taken you will be contacted by phone or by letter within the next 1-3 weeks. Call [phone]  if you have not heard about the biopsies in 3 weeks.  Please also call with any specific questions about appointments or follow up tests. YOU HAD AN ENDOSCOPIC PROCEDURE TODAY: Refer to the procedure report and other information in the discharge instructions given to you for any specific questions about what was found during the examination. If this information does not answer your questions, please call [facility] office at [phone] to clarify.   YOU SHOULD EXPECT: Some feelings of bloating in the abdomen. Passage of more gas than usual. Walking can help get rid of the air that was put into your GI tract during the procedure and reduce the bloating. If you had a lower endoscopy (such as a colonoscopy or flexible sigmoidoscopy) you may notice spotting of blood in your stool or on the toilet paper. Some abdominal soreness may be present for a  day or two, also.  DIET: Your first meal following the procedure should be a light meal and then it is ok to progress to your normal diet. A half-sandwich or bowl of soup is an example of a good first meal. Heavy or fried foods are harder to digest and may make you feel nauseous or bloated. Drink plenty of fluids but you should avoid alcoholic beverages for 24 hours. If you had a esophageal dilation, please see  attached instructions for diet.    ACTIVITY: Your care partner should take you home directly after the procedure. You should plan to take it easy, moving slowly for the rest of the day. You can resume normal activity the day after the procedure however YOU SHOULD NOT DRIVE, use power tools, machinery or perform tasks that involve climbing or major physical exertion for 24 hours (because of the sedation medicines used during the test).   SYMPTOMS TO REPORT IMMEDIATELY: A gastroenterologist can be reached at any hour. Please call [phone]  for any of the following symptoms:  . Following upper endoscopy (EGD, EUS, ERCP, esophageal dilation) Vomiting of blood or coffee ground material  New, significant abdominal pain  New, significant chest pain or pain under the shoulder blades  Painful or persistently difficult swallowing  New shortness of breath  Black, tarry-looking or red, bloody stools  FOLLOW UP:  If any biopsies were taken you will be contacted by phone or by letter within the next 1-3 weeks. Call [phone]  if you have not heard about the biopsies in 3 weeks.  Please also call with any specific questions about appointments or follow up tests.

## 2020-03-09 NOTE — Transfer of Care (Signed)
Immediate Anesthesia Transfer of Care Note  Patient: [name] [name]  Procedure(s) Performed: ESOPHAGOGASTRODUODENOSCOPY (EGD) WITH PROPOFOL WITH DIL (N/A ) SAVORY DILATION (N/A )  Patient Location: PACU  Anesthesia Type:MAC  Level of Consciousness: awake, alert , oriented and patient cooperative  Airway & Oxygen Therapy: Patient Spontanous Breathing and Patient connected to face mask oxygen  Post-op Assessment: Report given to RN, Post -op Vital signs reviewed and stable and Patient moving all extremities  Post vital signs: Reviewed and stable  Last Vitals:  Vitals Value Taken Time  BP 84/53 03/09/20 0905  Temp 36.6 C 03/09/20 0900  Pulse 51 03/09/20 0906  Resp 23 03/09/20 0906  SpO2 100 % 03/09/20 0906  Vitals shown include unvalidated device data.  Last Pain:  Vitals:   03/09/20 0900  TempSrc: Oral  PainSc: 0-No pain         Complications: No complications documented.

## 2020-03-09 NOTE — Op Note (Signed)
[facility] Patient Name: [name] [name] Procedure Date: 03/09/2020 MRN: [id number] Attending MD: Clarene Essex , MD Date of Birth: 04-14-1940 CSN: [id number] Age: 80 Admit Type: Outpatient Procedure:                Upper GI endoscopy Indications:              Dysphagia Providers:                Clarene Essex, MD, Cleda Daub, RN, Elspeth Cho                            Tech., Technician, Courtney Heys. [place], CRNA Referring MD:              Medicines:                Propofol total dose 170 mg IV, 50 mg IV lidocaine Complications:            No immediate complications. Estimated Blood Loss:     Estimated blood loss: none. Procedure:                Pre-Anesthesia Assessment:                           - Prior to the procedure, a History and Physical                            was performed, and patient medications and                            allergies were reviewed. The patient's tolerance of                            previous anesthesia was also reviewed. The risks                            and benefits of the procedure and the sedation                            options and risks were discussed with the patient.                            All questions were answered, and informed consent                            was obtained. Prior Anticoagulants: The patient has                            taken no previous anticoagulant or antiplatelet                            agents except for aspirin. ASA Grade Assessment:                            III - A patient with severe systemic disease. After  reviewing the risks and benefits, the patient was                            deemed in satisfactory condition to undergo the                            procedure.                           After obtaining informed consent, the endoscope was                            passed under direct vision. Throughout the                            procedure, the  patient's blood pressure, pulse, and                            oxygen saturations were monitored continuously. The                            GIF-H190 ([id number] Olympus gastroscope was                            introduced through the mouth, and advanced to the                            second part of duodenum. The upper GI endoscopy was                            accomplished without difficulty. The patient                            tolerated the procedure well. Scope In: Scope Out: Findings:      The larynx was normal.      A tiny hiatal hernia was present.      ?One benign-appearing, intrinsic mild stenosis was found it was widely       patent but he tended to have more distal esophagus and GE junction spasm       more compatible with achalasia or motility disorder. The ?stenosis was       traversed. At the end of the endoscopy A guidewire was placed into the       stomach and the scope was withdrawn. Dilation was performed with a       Savary dilator with no resistance and no heme at 16 mm only.      The entire examined stomach was normal.      The duodenal bulb, first portion of the duodenum and second portion of       the duodenum were normal.      The exam was otherwise without abnormality. Impression:               - Normal larynx.                           - Tiny hiatal hernia.                           - ?  Benign-appearing esophageal stenosis versus                            motility disorder in the distal esophagus. Dilated.                           - Normal stomach.                           - Normal duodenal bulb, first portion of the                            duodenum and second portion of the duodenum.                           - The examination was otherwise normal.                           - No specimens collected. Moderate Sedation:      Not Applicable - Patient had care per Anesthesia. Recommendation:           - Patient has a contact number available for                             emergencies. The signs and symptoms of potential                            delayed complications were discussed with the                            patient. Return to normal activities tomorrow.                            Written discharge instructions were provided to the                            patient.                           - Soft diet today.                           - Continue present medications.                           - Return to GI clinic in 1 -4month. Consider                            empiric Botox injection if this is not helpful or                            in the future if symptoms recur                           - Telephone GI clinic if symptomatic PRN. Procedure Code(s):        ---  Professional ---                           [phone], Esophagogastroduodenoscopy, flexible,                            transoral; with insertion of guide wire followed by                            passage of dilator(s) through esophagus over guide                            wire Diagnosis Code(s):        --- Professional ---                           K44.9, Diaphragmatic hernia without obstruction or                            gangrene                           K22.2, Esophageal obstruction                           R13.10, Dysphagia, unspecified CPT copyright 2019 [employer]. All rights reserved. The codes documented in this report are preliminary and upon coder review may  be revised to meet current compliance requirements. Clarene Essex, MD 03/09/2020 9:08:47 AM This report has been signed electronically. Number of Addenda: 0

## 2020-03-14 DIAGNOSIS — Z0000 Encounter for general adult medical examination without abnormal findings: Secondary | ICD-10-CM | POA: Diagnosis not present

## 2020-03-14 DIAGNOSIS — R42 Dizziness and giddiness: Secondary | ICD-10-CM | POA: Diagnosis not present

## 2020-03-14 DIAGNOSIS — E1140 Type 2 diabetes mellitus with diabetic neuropathy, unspecified: Secondary | ICD-10-CM | POA: Diagnosis not present

## 2020-03-14 DIAGNOSIS — I10 Essential (primary) hypertension: Secondary | ICD-10-CM | POA: Diagnosis not present

## 2020-03-14 DIAGNOSIS — E7800 Pure hypercholesterolemia, unspecified: Secondary | ICD-10-CM | POA: Diagnosis not present

## 2020-03-14 DIAGNOSIS — K219 Gastro-esophageal reflux disease without esophagitis: Secondary | ICD-10-CM | POA: Diagnosis not present

## 2020-03-14 DIAGNOSIS — I209 Angina pectoris, unspecified: Secondary | ICD-10-CM | POA: Diagnosis not present

## 2020-03-14 DIAGNOSIS — G4730 Sleep apnea, unspecified: Secondary | ICD-10-CM | POA: Diagnosis not present

## 2020-03-14 DIAGNOSIS — Z79899 Other long term (current) drug therapy: Secondary | ICD-10-CM | POA: Diagnosis not present

## 2020-03-14 DIAGNOSIS — I700 Atherosclerosis of aorta: Secondary | ICD-10-CM | POA: Diagnosis not present

## 2020-03-14 DIAGNOSIS — J411 Mucopurulent chronic bronchitis: Secondary | ICD-10-CM | POA: Diagnosis not present

## 2020-03-15 ENCOUNTER — Other Ambulatory Visit: Payer: Self-pay

## 2020-03-15 DIAGNOSIS — K219 Gastro-esophageal reflux disease without esophagitis: Secondary | ICD-10-CM | POA: Diagnosis not present

## 2020-03-15 DIAGNOSIS — J411 Mucopurulent chronic bronchitis: Secondary | ICD-10-CM | POA: Diagnosis not present

## 2020-03-15 DIAGNOSIS — I209 Angina pectoris, unspecified: Secondary | ICD-10-CM | POA: Diagnosis not present

## 2020-03-15 DIAGNOSIS — I10 Essential (primary) hypertension: Secondary | ICD-10-CM | POA: Diagnosis not present

## 2020-03-15 DIAGNOSIS — E1140 Type 2 diabetes mellitus with diabetic neuropathy, unspecified: Secondary | ICD-10-CM | POA: Diagnosis not present

## 2020-03-15 DIAGNOSIS — E7800 Pure hypercholesterolemia, unspecified: Secondary | ICD-10-CM | POA: Diagnosis not present

## 2020-03-15 MED ORDER — RANOLAZINE ER 1000 MG PO TB12: 1000.0000 mg | ORAL_TABLET | Freq: Two times a day (BID) | ORAL | 0 refills | Status: DC

## 2020-03-30 DIAGNOSIS — H9312 Tinnitus, left ear: Secondary | ICD-10-CM | POA: Diagnosis not present

## 2020-03-30 DIAGNOSIS — H903 Sensorineural hearing loss, bilateral: Secondary | ICD-10-CM | POA: Diagnosis not present

## 2020-03-30 DIAGNOSIS — R42 Dizziness and giddiness: Secondary | ICD-10-CM | POA: Diagnosis not present

## 2020-04-20 DIAGNOSIS — R42 Dizziness and giddiness: Secondary | ICD-10-CM | POA: Diagnosis not present

## 2020-04-21 ENCOUNTER — Other Ambulatory Visit: Payer: Self-pay | Admitting: Interventional Cardiology

## 2020-04-21 DIAGNOSIS — I209 Angina pectoris, unspecified: Secondary | ICD-10-CM

## 2020-04-26 DIAGNOSIS — H903 Sensorineural hearing loss, bilateral: Secondary | ICD-10-CM | POA: Diagnosis not present

## 2020-04-26 DIAGNOSIS — R42 Dizziness and giddiness: Secondary | ICD-10-CM | POA: Diagnosis not present

## 2020-05-11 DIAGNOSIS — R42 Dizziness and giddiness: Secondary | ICD-10-CM | POA: Diagnosis not present

## 2020-05-11 DIAGNOSIS — H8113 Benign paroxysmal vertigo, bilateral: Secondary | ICD-10-CM | POA: Diagnosis not present

## 2020-05-17 DIAGNOSIS — R42 Dizziness and giddiness: Secondary | ICD-10-CM | POA: Diagnosis not present

## 2020-05-17 DIAGNOSIS — H8113 Benign paroxysmal vertigo, bilateral: Secondary | ICD-10-CM | POA: Diagnosis not present

## 2020-05-24 DIAGNOSIS — E538 Deficiency of other specified B group vitamins: Secondary | ICD-10-CM | POA: Diagnosis not present

## 2020-05-24 DIAGNOSIS — E7800 Pure hypercholesterolemia, unspecified: Secondary | ICD-10-CM | POA: Diagnosis not present

## 2020-05-24 DIAGNOSIS — I700 Atherosclerosis of aorta: Secondary | ICD-10-CM | POA: Diagnosis not present

## 2020-05-24 DIAGNOSIS — I10 Essential (primary) hypertension: Secondary | ICD-10-CM | POA: Diagnosis not present

## 2020-05-24 DIAGNOSIS — E1140 Type 2 diabetes mellitus with diabetic neuropathy, unspecified: Secondary | ICD-10-CM | POA: Diagnosis not present

## 2020-05-24 DIAGNOSIS — G3184 Mild cognitive impairment, so stated: Secondary | ICD-10-CM | POA: Diagnosis not present

## 2020-05-25 ENCOUNTER — Other Ambulatory Visit: Payer: Self-pay | Admitting: Geriatric Medicine

## 2020-05-25 DIAGNOSIS — G3184 Mild cognitive impairment, so stated: Secondary | ICD-10-CM

## 2020-05-31 DIAGNOSIS — E119 Type 2 diabetes mellitus without complications: Secondary | ICD-10-CM | POA: Diagnosis not present

## 2020-06-06 ENCOUNTER — Other Ambulatory Visit: Payer: Self-pay

## 2020-06-06 ENCOUNTER — Ambulatory Visit
Admission: RE | Admit: 2020-06-06 | Discharge: 2020-06-06 | Disposition: A | Discharge status: Home / Self Care | Payer: Medicare Other | Source: Ambulatory Visit | Attending: Geriatric Medicine | Admitting: Geriatric Medicine

## 2020-06-06 DIAGNOSIS — R42 Dizziness and giddiness: Secondary | ICD-10-CM | POA: Diagnosis not present

## 2020-06-06 DIAGNOSIS — G3184 Mild cognitive impairment, so stated: Secondary | ICD-10-CM

## 2020-06-09 ENCOUNTER — Other Ambulatory Visit: Payer: Medicare Other

## 2020-06-12 ENCOUNTER — Other Ambulatory Visit: Payer: Self-pay

## 2020-06-12 DIAGNOSIS — I209 Angina pectoris, unspecified: Secondary | ICD-10-CM

## 2020-06-12 MED ORDER — ISOSORBIDE MONONITRATE ER 60 MG PO TB24: 90.0000 mg | ORAL_TABLET | Freq: Every day | ORAL | 0 refills | Status: DC

## 2020-06-13 ENCOUNTER — Telehealth: Payer: Self-pay | Admitting: Interventional Cardiology

## 2020-06-13 DIAGNOSIS — I209 Angina pectoris, unspecified: Secondary | ICD-10-CM

## 2020-06-13 NOTE — Telephone Encounter (Signed)
*  STAT* If patient is at the pharmacy, call can be transferred to refill team.   1. Which medications need to be refilled? (please list name of each medication and dose if known)   isosorbide mononitrate (IMDUR) 60 MG 24 hr tablet     2. Which pharmacy/location (including street and city if local pharmacy) is medication to be sent to? [facility], [place], [address]  3. Do they need a 30 day or 90 day supply? 90 day supply  Patient has an appointment scheduled for 08/16/20 with Ermalinda Barrios.

## 2020-06-14 MED ORDER — ISOSORBIDE MONONITRATE ER 60 MG PO TB24: 90.0000 mg | ORAL_TABLET | Freq: Every day | ORAL | 0 refills | Status: DC

## 2020-06-14 NOTE — Telephone Encounter (Signed)
Pt's medication was sent to pt's pharmacy as requested. Confirmation received.  °

## 2020-06-19 DIAGNOSIS — H26491 Other secondary cataract, right eye: Secondary | ICD-10-CM | POA: Diagnosis not present

## 2020-06-27 ENCOUNTER — Other Ambulatory Visit: Payer: Self-pay

## 2020-06-27 ENCOUNTER — Telehealth: Payer: Self-pay | Admitting: Interventional Cardiology

## 2020-06-27 MED ORDER — RANOLAZINE ER 1000 MG PO TB12: 1000.0000 mg | ORAL_TABLET | Freq: Two times a day (BID) | ORAL | 0 refills | Status: DC

## 2020-06-27 NOTE — Telephone Encounter (Signed)
*  STAT* If patient is at the pharmacy, call can be transferred to refill team.   1. Which medications need to be refilled? (please list name of each medication and dose if known)  ranolazine (RANEXA) 1000 MG SR tablet  2. Which pharmacy/location (including street and city if local pharmacy) is medication to be sent to? [facility] [phone] - [place], [place] - [address]  3. Do they need a 30 day or 90 day supply? 90 day supply

## 2020-07-01 ENCOUNTER — Other Ambulatory Visit: Payer: Self-pay | Admitting: Interventional Cardiology

## 2020-07-01 DIAGNOSIS — I209 Angina pectoris, unspecified: Secondary | ICD-10-CM

## 2020-07-30 ENCOUNTER — Emergency Department (HOSPITAL_COMMUNITY): Payer: Medicare Other

## 2020-07-30 ENCOUNTER — Encounter (HOSPITAL_COMMUNITY): Payer: Self-pay | Admitting: Emergency Medicine

## 2020-07-30 ENCOUNTER — Other Ambulatory Visit: Payer: Self-pay

## 2020-07-30 ENCOUNTER — Observation Stay (HOSPITAL_COMMUNITY)
Admission: EM | Admit: 2020-07-30 | Discharge: 2020-07-31 | Disposition: A | Discharge status: Home / Self Care | Payer: Medicare Other | Attending: Internal Medicine | Admitting: Internal Medicine

## 2020-07-30 DIAGNOSIS — I700 Atherosclerosis of aorta: Secondary | ICD-10-CM | POA: Diagnosis not present

## 2020-07-30 DIAGNOSIS — Q600 Renal agenesis, unilateral: Secondary | ICD-10-CM | POA: Diagnosis not present

## 2020-07-30 DIAGNOSIS — I110 Hypertensive heart disease with heart failure: Secondary | ICD-10-CM | POA: Insufficient documentation

## 2020-07-30 DIAGNOSIS — N281 Cyst of kidney, acquired: Secondary | ICD-10-CM | POA: Diagnosis not present

## 2020-07-30 DIAGNOSIS — R079 Chest pain, unspecified: Secondary | ICD-10-CM

## 2020-07-30 DIAGNOSIS — R072 Precordial pain: Secondary | ICD-10-CM | POA: Diagnosis present

## 2020-07-30 DIAGNOSIS — I25119 Atherosclerotic heart disease of native coronary artery with unspecified angina pectoris: Principal | ICD-10-CM | POA: Insufficient documentation

## 2020-07-30 DIAGNOSIS — R0789 Other chest pain: Secondary | ICD-10-CM | POA: Diagnosis not present

## 2020-07-30 DIAGNOSIS — R1110 Vomiting, unspecified: Secondary | ICD-10-CM | POA: Diagnosis not present

## 2020-07-30 DIAGNOSIS — R112 Nausea with vomiting, unspecified: Secondary | ICD-10-CM | POA: Diagnosis not present

## 2020-07-30 DIAGNOSIS — R110 Nausea: Secondary | ICD-10-CM

## 2020-07-30 DIAGNOSIS — Z96652 Presence of left artificial knee joint: Secondary | ICD-10-CM | POA: Diagnosis not present

## 2020-07-30 DIAGNOSIS — Z20822 Contact with and (suspected) exposure to covid-19: Secondary | ICD-10-CM | POA: Insufficient documentation

## 2020-07-30 DIAGNOSIS — I5022 Chronic systolic (congestive) heart failure: Secondary | ICD-10-CM | POA: Diagnosis not present

## 2020-07-30 DIAGNOSIS — Z79899 Other long term (current) drug therapy: Secondary | ICD-10-CM | POA: Insufficient documentation

## 2020-07-30 DIAGNOSIS — Z7982 Long term (current) use of aspirin: Secondary | ICD-10-CM | POA: Insufficient documentation

## 2020-07-30 DIAGNOSIS — I2510 Atherosclerotic heart disease of native coronary artery without angina pectoris: Secondary | ICD-10-CM | POA: Diagnosis not present

## 2020-07-30 DIAGNOSIS — Z8616 Personal history of COVID-19: Secondary | ICD-10-CM | POA: Insufficient documentation

## 2020-07-30 DIAGNOSIS — J439 Emphysema, unspecified: Secondary | ICD-10-CM | POA: Diagnosis not present

## 2020-07-30 DIAGNOSIS — K2100 Gastro-esophageal reflux disease with esophagitis, without bleeding: Secondary | ICD-10-CM | POA: Diagnosis not present

## 2020-07-30 DIAGNOSIS — I10 Essential (primary) hypertension: Secondary | ICD-10-CM

## 2020-07-30 DIAGNOSIS — J432 Centrilobular emphysema: Secondary | ICD-10-CM | POA: Diagnosis not present

## 2020-07-30 DIAGNOSIS — K219 Gastro-esophageal reflux disease without esophagitis: Secondary | ICD-10-CM | POA: Diagnosis present

## 2020-07-30 DIAGNOSIS — I25110 Atherosclerotic heart disease of native coronary artery with unstable angina pectoris: Secondary | ICD-10-CM | POA: Diagnosis not present

## 2020-07-30 DIAGNOSIS — J449 Chronic obstructive pulmonary disease, unspecified: Secondary | ICD-10-CM | POA: Insufficient documentation

## 2020-07-30 DIAGNOSIS — K220 Achalasia of cardia: Secondary | ICD-10-CM | POA: Diagnosis present

## 2020-07-30 DIAGNOSIS — F17210 Nicotine dependence, cigarettes, uncomplicated: Secondary | ICD-10-CM | POA: Insufficient documentation

## 2020-07-30 DIAGNOSIS — N3289 Other specified disorders of bladder: Secondary | ICD-10-CM | POA: Diagnosis not present

## 2020-07-30 DIAGNOSIS — R55 Syncope and collapse: Secondary | ICD-10-CM | POA: Diagnosis not present

## 2020-07-30 DIAGNOSIS — N200 Calculus of kidney: Secondary | ICD-10-CM | POA: Diagnosis not present

## 2020-07-30 DIAGNOSIS — J929 Pleural plaque without asbestos: Secondary | ICD-10-CM | POA: Diagnosis not present

## 2020-07-30 LAB — HEPATIC FUNCTION PANEL
ALT: 15 U/L (ref 0–44)
AST: 16 U/L (ref 15–41)
Albumin: 3.1 g/dL — ABNORMAL LOW (ref 3.5–5.0)
Alkaline Phosphatase: 62 U/L (ref 38–126)
Bilirubin, Direct: 0.2 mg/dL (ref 0.0–0.2)
Indirect Bilirubin: 0.4 mg/dL (ref 0.3–0.9)
Total Bilirubin: 0.6 mg/dL (ref 0.3–1.2)
Total Protein: 6.5 g/dL (ref 6.5–8.1)

## 2020-07-30 LAB — CBC
HCT: 36.6 % — ABNORMAL LOW (ref 39.0–52.0)
HCT: 38.2 % — ABNORMAL LOW (ref 39.0–52.0)
Hemoglobin: 12 g/dL — ABNORMAL LOW (ref 13.0–17.0)
Hemoglobin: 12.5 g/dL — ABNORMAL LOW (ref 13.0–17.0)
MCH: 33.1 pg (ref 26.0–34.0)
MCH: 32.8 pg (ref 26.0–34.0)
MCHC: 32.8 g/dL (ref 30.0–36.0)
MCHC: 32.7 g/dL (ref 30.0–36.0)
MCV: 101.1 fL — ABNORMAL HIGH (ref 80.0–100.0)
MCV: 100.3 fL — ABNORMAL HIGH (ref 80.0–100.0)
Platelets: 171 10*3/uL (ref 150–400)
Platelets: 172 10*3/uL (ref 150–400)
RBC: 3.62 MIL/uL — ABNORMAL LOW (ref 4.22–5.81)
RBC: 3.81 MIL/uL — ABNORMAL LOW (ref 4.22–5.81)
RDW: 12.8 % (ref 11.5–15.5)
RDW: 12.7 % (ref 11.5–15.5)
WBC: 5.9 10*3/uL (ref 4.0–10.5)
WBC: 5.7 10*3/uL (ref 4.0–10.5)
nRBC: 0 % (ref 0.0–0.2)
nRBC: 0 % (ref 0.0–0.2)

## 2020-07-30 LAB — LIPASE, BLOOD: Lipase: 21 U/L (ref 11–51)

## 2020-07-30 LAB — LIPID PANEL
Cholesterol: 125 mg/dL (ref 0–200)
HDL: 44 mg/dL (ref >40)
LDL Cholesterol: 59 mg/dL (ref 0–99)
Total CHOL/HDL Ratio: 2.8 RATIO
Triglycerides: 109 mg/dL (ref <150)
VLDL: 22 mg/dL (ref 0–40)

## 2020-07-30 LAB — TROPONIN I (HIGH SENSITIVITY)
Troponin I (High Sensitivity): 6 ng/L (ref <18)
Troponin I (High Sensitivity): 5 ng/L (ref <18)
Troponin I (High Sensitivity): 7 ng/L (ref <18)
Troponin I (High Sensitivity): 6 ng/L (ref <18)

## 2020-07-30 LAB — BASIC METABOLIC PANEL
Anion gap: 7 (ref 5–15)
BUN: 17 mg/dL (ref 8–23)
CO2: 28 mmol/L (ref 22–32)
Calcium: 9.2 mg/dL (ref 8.9–10.3)
Chloride: 102 mmol/L (ref 98–111)
Creatinine, Ser: 0.88 mg/dL (ref 0.61–1.24)
GFR, Estimated: >60 mL/min (ref >60)
Glucose, Bld: 110 mg/dL — ABNORMAL HIGH (ref 70–99)
Potassium: 4.2 mmol/L (ref 3.5–5.1)
Sodium: 137 mmol/L (ref 135–145)

## 2020-07-30 LAB — CREATININE, SERUM
Creatinine, Ser: 0.86 mg/dL (ref 0.61–1.24)
GFR, Estimated: >60 mL/min (ref >60)

## 2020-07-30 MED ORDER — ROSUVASTATIN CALCIUM 5 MG PO TABS
10.0000 mg | ORAL_TABLET | Freq: Every day | ORAL | Status: DC
  Administered 2020-07-30 – 2020-07-31 (×2): 10 mg via ORAL
  Filled 2020-07-30 (×3): qty 2

## 2020-07-30 MED ORDER — MORPHINE SULFATE (PF) 4 MG/ML IV SOLN
4.0000 mg | Freq: Once | INTRAVENOUS | Status: DC
  Filled 2020-07-30: qty 1

## 2020-07-30 MED ORDER — IOHEXOL 350 MG/ML SOLN
75.0000 mL | Freq: Once | INTRAVENOUS | Status: AC | PRN
  Administered 2020-07-30: 75 mL via INTRAVENOUS

## 2020-07-30 MED ORDER — BUPROPION HCL 100 MG PO TABS
200.0000 mg | ORAL_TABLET | Freq: Every day | ORAL | Status: DC
  Administered 2020-07-30: 200 mg via ORAL
  Filled 2020-07-30 (×2): qty 2

## 2020-07-30 MED ORDER — ASPIRIN EC 81 MG PO TBEC
81.0000 mg | DELAYED_RELEASE_TABLET | Freq: Every day | ORAL | Status: DC
  Administered 2020-07-30 – 2020-07-31 (×2): 81 mg via ORAL
  Filled 2020-07-30 (×3): qty 1

## 2020-07-30 MED ORDER — ISOSORBIDE MONONITRATE ER 60 MG PO TB24
90.0000 mg | ORAL_TABLET | Freq: Every day | ORAL | Status: DC
  Administered 2020-07-31: 90 mg via ORAL
  Filled 2020-07-30 (×2): qty 1

## 2020-07-30 MED ORDER — ADULT MULTIVITAMIN W/MINERALS CH
1.0000 | ORAL_TABLET | Freq: Every day | ORAL | Status: DC
  Administered 2020-07-31: 1 via ORAL
  Filled 2020-07-30 (×2): qty 1

## 2020-07-30 MED ORDER — VITAMIN D3 25 MCG (1000 UNIT) PO TABS
2000.0000 [IU] | ORAL_TABLET | Freq: Every morning | ORAL | Status: DC
  Administered 2020-07-31: 2000 [IU] via ORAL
  Filled 2020-07-30 (×2): qty 2

## 2020-07-30 MED ORDER — ONDANSETRON HCL 4 MG/2ML IJ SOLN: 4.0000 mg | Freq: Four times a day (QID) | INTRAMUSCULAR | Status: DC | PRN

## 2020-07-30 MED ORDER — HYDROCODONE-ACETAMINOPHEN 5-325 MG PO TABS: 1.0000 | ORAL_TABLET | Freq: Four times a day (QID) | ORAL | Status: DC | PRN

## 2020-07-30 MED ORDER — DOCUSATE SODIUM 100 MG PO CAPS: 100.0000 mg | ORAL_CAPSULE | Freq: Two times a day (BID) | ORAL | Status: DC | PRN

## 2020-07-30 MED ORDER — POLYETHYLENE GLYCOL 3350 17 GM/SCOOP PO POWD
17.0000 g | Freq: Every day | ORAL | Status: DC | PRN
  Filled 2020-07-30: qty 255

## 2020-07-30 MED ORDER — ACETAMINOPHEN 325 MG PO TABS: 650.0000 mg | ORAL_TABLET | ORAL | Status: DC | PRN

## 2020-07-30 MED ORDER — ONDANSETRON HCL 4 MG PO TABS: 4.0000 mg | ORAL_TABLET | Freq: Three times a day (TID) | ORAL | Status: DC | PRN

## 2020-07-30 MED ORDER — VITAMIN B-12 1000 MCG PO TABS
1000.0000 ug | ORAL_TABLET | Freq: Every day | ORAL | Status: DC
  Administered 2020-07-30 – 2020-07-31 (×2): 1000 ug via ORAL
  Filled 2020-07-30 (×3): qty 1

## 2020-07-30 MED ORDER — RANOLAZINE ER 500 MG PO TB12
1000.0000 mg | ORAL_TABLET | Freq: Two times a day (BID) | ORAL | Status: DC
  Administered 2020-07-30 – 2020-07-31 (×2): 1000 mg via ORAL
  Filled 2020-07-30 (×3): qty 2

## 2020-07-30 MED ORDER — NITROGLYCERIN 0.4 MG SL SUBL
0.4000 mg | SUBLINGUAL_TABLET | Freq: Three times a day (TID) | SUBLINGUAL | Status: DC
  Administered 2020-07-31: 0.4 mg via SUBLINGUAL
  Filled 2020-07-30: qty 1

## 2020-07-30 MED ORDER — POLYETHYLENE GLYCOL 3350 17 G PO PACK: 17.0000 g | PACK | Freq: Every day | ORAL | Status: DC | PRN

## 2020-07-30 MED ORDER — TRAMADOL HCL 50 MG PO TABS: 50.0000 mg | ORAL_TABLET | Freq: Four times a day (QID) | ORAL | Status: DC | PRN

## 2020-07-30 MED ORDER — FINASTERIDE 5 MG PO TABS
5.0000 mg | ORAL_TABLET | Freq: Every day | ORAL | Status: DC
  Administered 2020-07-30: 5 mg via ORAL
  Filled 2020-07-30: qty 1

## 2020-07-30 MED ORDER — ONDANSETRON HCL 4 MG/2ML IJ SOLN
4.0000 mg | Freq: Once | INTRAMUSCULAR | Status: AC
  Administered 2020-07-30: 4 mg via INTRAVENOUS
  Filled 2020-07-30: qty 2

## 2020-07-30 MED ORDER — METOPROLOL TARTRATE 12.5 MG HALF TABLET
12.5000 mg | ORAL_TABLET | Freq: Two times a day (BID) | ORAL | Status: DC
  Administered 2020-07-30 – 2020-07-31 (×2): 12.5 mg via ORAL
  Filled 2020-07-30 (×3): qty 1

## 2020-07-30 MED ORDER — ENOXAPARIN SODIUM 40 MG/0.4ML IJ SOSY
40.0000 mg | PREFILLED_SYRINGE | INTRAMUSCULAR | Status: DC
  Administered 2020-07-30: 40 mg via SUBCUTANEOUS
  Filled 2020-07-30: qty 0.4

## 2020-07-30 MED ORDER — PANTOPRAZOLE SODIUM 40 MG PO TBEC
40.0000 mg | DELAYED_RELEASE_TABLET | Freq: Two times a day (BID) | ORAL | Status: DC
  Administered 2020-07-31: 40 mg via ORAL
  Filled 2020-07-30: qty 1
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
## 2020-07-30 NOTE — ED Notes (Signed)
RN attempted report x1.  

## 2020-07-30 NOTE — ED Triage Notes (Signed)
Family member reports history of nausea.  Nausea worse over the past week.  Reports pain to center of chest and "diaphram" pain that started around 11:15 once he got to church this morning.  States "vision went black" but doesn't think he passed out.  Took 1 NTG at 11:30am.

## 2020-07-30 NOTE — H&P (Signed)
History and Physical   [name] RUE:[id number] DOB: [birth year] DOA: 07/30/2020  Referring MD/NP/PA: Dr. Francia Greaves  PCP: [name], MD   Patient coming from: Home  Chief Complaint: Shortness of breath and chest pain  HPI: [name] [name] is a 80 y.o. male with medical history significant of coronary artery disease, COPD, degenerative disc disease, obstructive sleep apnea, history of previous compression fractures, anxiety disorder, BPH, and multiple other medical problems who has sudden onset of substernal and epigastric abdominal discomfort today.  This was when he was sitting in the [place].  This was associated with some nausea.  He almost passed out.  Patient has known cardiac disease and follows up with Dr. [name].  He has inoperable disease a problem.  Pain was substernal and lasted few minutes.  Is now gone.  Initial enzymes and EKG showed no significant changes.  Due to patient's known history he is being admitted for observation and ruling out MI.  ED Course: Temperature 98, blood pressure 100/56, pulse 72 respiratory rate of 21 oxygen sat 96% on room air.  Chemistry largely within normal except for glucose 110.  LFTs within normal.  CBC largely within normal unchanged from baseline.  Initial troponin is 6517.  Initial EKG showed no acute changes.  Patient was admitted for observation with known cardiac history and disease.  Last echocardiogram in our system was in September 2021.  Last cardiac cath was in June 2020.  At that time it showed proximal RCA to distal RCA lesion which is 100% stenosed.  Also mid LAD lesion that is 30% stenosed.  His EF was 50 to 55%.  Recommendation was for medical therapy.  Stress test was done back in 2015.  Patient will be admitted therefore for further evaluation and treatment  Review of Systems: As per HPI otherwise 10 point review of systems negative.    Past Medical History:  Diagnosis Date  . Adenomatous polyp 2007  . Anginal pain (HCC)     Chronic   . Anxiety   . Arthritis    "my body"  . B12 deficiency   . BPH (benign prostatic hyperplasia)   . Chronic back pain    "all over"  . Chronic low back pain   . COPD (chronic obstructive pulmonary disease) ([place])   . Coronary artery disease 2009  . DDD (degenerative disc disease), cervical   . DDD (degenerative disc disease), lumbosacral   . DDD (degenerative disc disease), thoracic   . Depression   . Gastroesophageal reflux   . Gynecomastia 06/2009  . History of kidney stones   . Hyperglycemia   . Hyperlipidemia LDL goal < 70   . MI (myocardial infarction) ([place]) 08/1989; 1993  . OSA on CPAP    "have mask; don't wear it" (02/02/2014) 09/25/15- now has a BiPAp- doesn'tt use it  . Peptic ulcer disease 1959  . Pre-diabetes   . Right bundle branch block   . Tobacco abuse   . Vertebral compression fracture ([place]) 1970    Past Surgical History:  Procedure Laterality Date  . BYPASS GRAFT POPLITEAL TO POPLITEAL Left 06/2005   Dr Donnetta Hutching  . BYPASS GRAFT POPLITEAL TO POPLITEAL Right 02/2001   Dr Deon Pilling  . CARDIAC CATHETERIZATION  1991; 1993; 08/2012; 11/2013; 02/02/2014   I've had 6-7 total  . CARDIAC CATHETERIZATION  02/02/2014   Procedure: LEFT HEART CATH AND CORONARY ANGIOGRAPHY;  Surgeon: Jettie Booze, MD;  mild disease in LAD/CFX, RCA w/ CTO, unsuccessfull PCI both  w/ antegrade and retrograde approaches   . CARPAL TUNNEL RELEASE Left 06/01/2019   Procedure: LEFT CARPAL TUNNEL RELEASE;  Surgeon: Daryll Brod, MD;  Location: [place];  Service: Orthopedics;  Laterality: Left;  FOREARM BLOCK  . CATARACT EXTRACTION W/ INTRAOCULAR LENS  IMPLANT, BILATERAL Bilateral ~ 2012  . COLONOSCOPY W/ POLYPECTOMY    . CORONARY ANGIOPLASTY  1990's  . CYSTOSCOPY WITH STENT PLACEMENT Right 11/19/2019   Procedure: CYSTOSCOPY WITH RIGHT URETERAL STENT PLACEMENT;  Surgeon: Lucas Mallow, MD;  Location: [place];  Service: Urology;  Laterality: Right;  .  CYSTOSCOPY/URETEROSCOPY/HOLMIUM LASER/STENT PLACEMENT Right 12/22/2019   Procedure: CYSTOSCOPY RIGHT URETEROSCOPY/HOLMIUM LASER/STENT PLACEMENT;  Surgeon: Ardis Hughs, MD;  Location: [facility];  Service: Urology;  Laterality: Right;  . ESOPHAGOGASTRODUODENOSCOPY (EGD) WITH ESOPHAGEAL DILATION  11/2007  . ESOPHAGOGASTRODUODENOSCOPY (EGD) WITH PROPOFOL N/A 09/28/2015   Procedure: ESOPHAGOGASTRODUODENOSCOPY (EGD) WITH PROPOFOL;  Surgeon: Clarene Essex, MD;  Location: [facility];  Service: Endoscopy;  Laterality: N/A;  . ESOPHAGOGASTRODUODENOSCOPY (EGD) WITH PROPOFOL N/A 09/09/2017   Procedure: ESOPHAGOGASTRODUODENOSCOPY (EGD) WITH PROPOFOL;  Surgeon: Clarene Essex, MD;  Location: [facility];  Service: Endoscopy;  Laterality: N/A;  . ESOPHAGOGASTRODUODENOSCOPY (EGD) WITH PROPOFOL N/A 03/09/2020   Procedure: ESOPHAGOGASTRODUODENOSCOPY (EGD) WITH PROPOFOL WITH DIL;  Surgeon: Clarene Essex, MD;  Location: [facility];  Service: Endoscopy;  Laterality: N/A;  . EYE SURGERY Bilateral   . JOINT REPLACEMENT    . LEFT HEART CATH AND CORONARY ANGIOGRAPHY N/A 08/10/2018   Procedure: LEFT HEART CATH AND CORONARY ANGIOGRAPHY;  Surgeon: Troy Sine, MD;  Location: [place] [facility];  Service: Cardiovascular;  Laterality: N/A;  . LEFT HEART CATHETERIZATION WITH CORONARY ANGIOGRAM N/A 09/03/2012   Procedure: LEFT HEART CATHETERIZATION WITH CORONARY ANGIOGRAM;  Surgeon: Sinclair Grooms, MD;  Location: [facility];  Service: Cardiovascular;  Laterality: N/A;  . LEFT HEART CATHETERIZATION WITH CORONARY ANGIOGRAM N/A 12/01/2013   Procedure: LEFT HEART CATHETERIZATION WITH CORONARY ANGIOGRAM;  Surgeon: Sinclair Grooms, MD;  Location: [facility] LAB;  Service: Cardiovascular;  Laterality: N/A;  . LOWER EXTREMITY ANGIOGRAM Left 05/2005   L pop-pop BPG patent  . NAILBED REPAIR Right 09/17/2016   Procedure: ABLATION NAILBED RIGHT INDEX;  Surgeon: Daryll Brod, MD;  Location: [place];  Service: Orthopedics;   Laterality: Right;  . NASAL SEPTUM SURGERY  1977  . PENILE PROSTHESIS IMPLANT  1990's  . SAVORY DILATION N/A 09/28/2015   Procedure: SAVORY DILATION;  Surgeon: Clarene Essex, MD;  Location: [facility]OSCOPY;  Service: Endoscopy;  Laterality: N/A;  have balloons available  . SAVORY DILATION N/A 09/09/2017   Procedure: SAVORY DILATION;  Surgeon: Clarene Essex, MD;  Location: [facility];  Service: Endoscopy;  Laterality: N/A;  . SAVORY DILATION N/A 03/09/2020   Procedure: SAVORY DILATION;  Surgeon: Clarene Essex, MD;  Location: [facility];  Service: Endoscopy;  Laterality: N/A;  . SCROTAL SURGERY  08/1999   pump revision/notes 07/11/2010  . SKIN FULL THICKNESS GRAFT Right 09/17/2016   Procedure: SKIN GRAFT FROM UPPER ARM;  Surgeon: Daryll Brod, MD;  Location: [place];  Service: Orthopedics;  Laterality: Right;  . TOTAL KNEE ARTHROPLASTY Left 01/2009     reports that he has been smoking cigarettes. He has a 14.75 pack-year smoking history. He has quit using smokeless tobacco.  His smokeless tobacco use included chew. He reports that he does not drink alcohol and does not use drugs.  Allergies  Allergen Reactions  . Doxycycline Other (  See Comments)    Dizziness  . Flomax [Tamsulosin] Other (See Comments)    Dizziness   . Lipitor [Atorvastatin] Other (See Comments)    Muscle weakness  . Rhubarb Other (See Comments)    From childhood- exact reaction not recalled, but is allergic  . Sulfa Antibiotics Hives  . Sulfacetamide Sodium Hives  . Clindamycin/Lincomycin Rash  . Other Hives, Itching, Rash and Other (See Comments)    UNNAMED ANTIBIOTIC- SEVERE SYMPTOMS  [place]- From childhood- exact reaction not recalled, but is allergic  . Vytorin [Ezetimibe-Simvastatin] Other (See Comments)    Bodyaches    Family History  Problem Relation Age of Onset  . Diabetes Father 61       deceased  . Peripheral vascular disease Father   . Pneumonia Father   . Alzheimer's disease Mother 64        deceased  . Fibromyalgia Sister 39       deceased  . Hypertension Son   . Obesity Daughter   . Diabetes Daughter      Prior to Admission medications   Medication Sig Start Date End Date Taking? Authorizing Provider  aspirin EC 81 MG tablet Take 1 tablet (81 mg total) by mouth daily. Resume after 4 days. Patient taking differently: Take 81 mg by mouth daily. 06/17/15  Yes Bonnielee Haff, MD  buPROPion (WELLBUTRIN) 100 MG tablet Take 200 mg by mouth at bedtime.    Yes [provider]  Cholecalciferol (VITAMIN D3) 50 MCG (2000 UT) TABS Take 2,000 Units by mouth in the morning.   Yes [provider]  docusate sodium (COLACE) 100 MG capsule Take 100 mg by mouth 2 (two) times daily as needed for mild constipation.   Yes [provider]  finasteride (PROSCAR) 5 MG tablet Take 5 mg by mouth at bedtime.   Yes [provider]  HYDROcodone-acetaminophen (NORCO/VICODIN) 5-325 MG tablet Take 1 tablet by mouth every 6 (six) hours as needed for moderate pain. 02/03/20  Yes [provider]  isosorbide mononitrate (IMDUR) 60 MG 24 hr tablet Take 1.5 tablets (90 mg total) by mouth daily. Please keep upcoming appt in June 2022 with Cardiologist before anymore refills. Thank you Patient taking differently: Take 90 mg by mouth daily. 06/14/20  Yes Belva Crome, MD  metoprolol tartrate (LOPRESSOR) 25 MG tablet Take 0.5 tablets (12.5 mg total) by mouth 2 (two) times daily. 10/18/19  Yes Belva Crome, MD  Multiple Vitamin (MULTIVITAMIN WITH MINERALS) TABS tablet Take 1 tablet by mouth daily.   Yes [provider]  nitroGLYCERIN (NITROSTAT) 0.4 MG SL tablet Place 0.4 mg under the tongue See admin instructions. Dissolve 0.4 mg sublingually 10 minutes before each meal for esophageal spasms and as needed for chest pain   Yes [provider]  ondansetron (ZOFRAN) 4 MG tablet Take 4 mg by mouth every 8 (eight) hours as needed for nausea or vomiting.   Yes  [provider]  pantoprazole (PROTONIX) 40 MG tablet Take 40 mg by mouth 2 (two) times daily with a meal.   Yes [provider]  polyethylene glycol powder (GLYCOLAX/MIRALAX) 17 GM/SCOOP powder Take 17 g by mouth daily as needed for mild constipation (MIX AND DRINK).   Yes [provider]  ranolazine (RANEXA) 1000 MG SR tablet Take 1 tablet (1,000 mg total) by mouth 2 (two) times daily. 06/27/20  Yes Belva Crome, MD  rosuvastatin (CRESTOR) 10 MG tablet Take 10 mg by mouth daily.   Yes  [provider]  traMADol (ULTRAM) 50 MG tablet Take 1 tablet (50 mg total) by mouth every 6 (six) hours as needed. Patient taking differently: Take 50 mg by mouth every 6 (six) hours as needed for moderate pain. 06/01/19  Yes Daryll Brod, MD  vitamin B-12 (CYANOCOBALAMIN) 1000 MCG tablet Take 1,000 mcg by mouth daily.   Yes [provider]    Physical Exam: Vitals:   07/30/20 1800 07/30/20 2022 07/30/20 2204 07/31/20 0020  BP: (!) 162/64 (!) 185/74 (!) 152/61 (!) 152/74  Pulse: (!) 51 (!) 50 (!) 55 (!) 57  Resp: 17 19  19   Temp:  97.8 F (36.6 C)  98 F (36.7 C)  TempSrc:  Oral  Oral  SpO2: 99% 98%  97%  Weight:  82.1 kg    Height:  5\' 9"  (1.753 m)        Constitutional: NAD, calm, comfortable Vitals:   07/30/20 1800 07/30/20 2022 07/30/20 2204 07/31/20 0020  BP: (!) 162/64 (!) 185/74 (!) 152/61 (!) 152/74  Pulse: (!) 51 (!) 50 (!) 55 (!) 57  Resp: 17 19  19   Temp:  97.8 F (36.6 C)  98 F (36.7 C)  TempSrc:  Oral  Oral  SpO2: 99% 98%  97%  Weight:  82.1 kg    Height:  5\' 9"  (1.753 m)     Eyes: PERRL, lids and conjunctivae normal ENMT: Mucous membranes are moist. Posterior pharynx clear of any exudate or lesions.Normal dentition.  Neck: normal, supple, no masses, no thyromegaly Respiratory: clear to auscultation bilaterally, no wheezing, no crackles. Normal respiratory effort. No accessory muscle use.  Cardiovascular: Regular rate and rhythm, no  murmurs / rubs / gallops. No extremity edema. 2+ pedal pulses. No carotid bruits.  Abdomen: no tenderness, no masses palpated. No hepatosplenomegaly. Bowel sounds positive.  Musculoskeletal: no clubbing / cyanosis. No joint deformity upper and lower extremities. Good ROM, no contractures. Normal muscle tone.  Skin: no rashes, lesions, ulcers. No induration Neurologic: CN 2-12 grossly intact. Sensation intact, DTR normal. Strength 5/5 in all 4.  Psychiatric: Normal judgment and insight. Alert and oriented x 3. Normal mood.     Labs on Admission: I have personally reviewed following labs and imaging studies  CBC: Recent Labs  Lab 07/30/20 1301 07/30/20 2039  WBC 5.9 5.7  HGB 12.0* 12.5*  HCT 36.6* 38.2*  MCV 101.1* 100.3*  PLT 171 983   Basic Metabolic Panel: Recent Labs  Lab 07/30/20 1301 07/30/20 2039  NA 137  --   K 4.2  --   CL 102  --   CO2 28  --   GLUCOSE 110*  --   BUN 17  --   CREATININE 0.88 0.86  CALCIUM 9.2  --    GFR: Estimated Creatinine Clearance: 68.5 mL/min (by C-G formula based on SCr of 0.86 mg/dL). Liver Function Tests: Recent Labs  Lab 07/30/20 2039  AST 16  ALT 15  ALKPHOS 62  BILITOT 0.6  PROT 6.5  ALBUMIN 3.1*   Recent Labs  Lab 07/30/20 2039  LIPASE 21   No results for input(s): AMMONIA in the last 168 hours. Coagulation Profile: No results for input(s): INR, PROTIME in the last 168 hours. Cardiac Enzymes: No results for input(s): CKTOTAL, CKMB, CKMBINDEX, TROPONINI in the last 168 hours. BNP (last 3 results) No results for input(s): PROBNP in the last 8760 hours. HbA1C: No results for input(s): HGBA1C in the last 72 hours. CBG: No results for input(s): GLUCAP in  the last 168 hours. Lipid Profile: Recent Labs    07/30/20 2039  CHOL 125  HDL 44  LDLCALC 59  TRIG 109  CHOLHDL 2.8   Thyroid Function Tests: No results for input(s): TSH, T4TOTAL, FREET4, T3FREE, THYROIDAB in the last 72 hours. Anemia Panel: No results for  input(s): VITAMINB12, FOLATE, FERRITIN, TIBC, IRON, RETICCTPCT in the last 72 hours. Urine analysis:    Component Value Date/Time   COLORURINE AMBER (A) 11/19/2019 1427   APPEARANCEUR HAZY (A) 11/19/2019 1427   LABSPEC 1.009 11/19/2019 1427   PHURINE 5.0 11/19/2019 1427   GLUCOSEU NEGATIVE 11/19/2019 1427   HGBUR LARGE (A) 11/19/2019 1427   BILIRUBINUR NEGATIVE 11/19/2019 1427   KETONESUR NEGATIVE 11/19/2019 1427   PROTEINUR 30 (A) 11/19/2019 1427   UROBILINOGEN 1.0 01/31/2009 1427   NITRITE NEGATIVE 11/19/2019 1427   LEUKOCYTESUR LARGE (A) 11/19/2019 1427   Sepsis Labs: @LABRCNTIP (procalcitonin:4,lacticidven:4) )No results found for this or any previous visit (from the past 240 hour(s)).   Radiological Exams on Admission: DG Chest 2 View  Result Date: 07/30/2020 CLINICAL DATA:  Chest pain with nausea and vomiting EXAM: CHEST - 2 VIEW COMPARISON:  March 08, 2020 FINDINGS: Lungs are clear. Heart size and pulmonary vascularity are normal. No adenopathy. There is aortic atherosclerosis. There is degenerative change in the thoracic spine. No pneumothorax. IMPRESSION: Lungs clear. Heart size normal. Aortic Atherosclerosis (ICD10-I70.0). Electronically Signed   By: Lowella Grip III M.D.   On: 07/30/2020 13:40   CT Angio Chest/Abd/Pel for Dissection W and/or Wo Contrast  Result Date: 07/30/2020 CLINICAL DATA:  Abdominal pain, aortic dissection suspected EXAM: CT ANGIOGRAPHY CHEST, ABDOMEN AND PELVIS TECHNIQUE: Non-contrast CT of the chest was initially obtained. Multidetector CT imaging through the chest, abdomen and pelvis was performed using the standard protocol during bolus administration of intravenous contrast. Multiplanar reconstructed images and MIPs were obtained and reviewed to evaluate the vascular anatomy. CONTRAST:  15mL OMNIPAQUE IOHEXOL 350 MG/ML SOLN COMPARISON:  11/19/2019 FINDINGS: CTA CHEST FINDINGS Cardiovascular: Preferential opacification of the thoracic aorta. Normal  contour and caliber of the thoracic aorta. No evidence of aneurysm, dissection, or other acute aortic pathology. Mild mixed calcific atherosclerosis. Normal heart size. Three-vessel coronary artery calcifications. No pericardial effusion. Mediastinum/Nodes: No enlarged mediastinal, hilar, or axillary lymph nodes. The mid and lower esophagus is thickened and fluid-filled. Thyroid gland and trachea demonstrate no significant findings. Lungs/Pleura: Mild centrilobular and paraseptal emphysema. Background of very fine centrilobular nodularity. Diffuse bilateral bronchial wall thickening. No pleural effusion or pneumothorax. Musculoskeletal: No chest wall abnormality. No acute or significant osseous findings. Review of the MIP images confirms the above findings. CTA ABDOMEN AND PELVIS FINDINGS VASCULAR Normal contour and caliber of the abdominal aorta. No evidence of aneurysm, dissection, or other acute aortic pathology. Standard branching pattern of the abdominal aorta, with solitary bilateral renal arteries. Moderate mixed calcific atherosclerosis. Review of the MIP images confirms the above findings. NON-VASCULAR Hepatobiliary: No solid liver abnormality is seen. No gallstones, gallbladder wall thickening, or biliary dilatation. Pancreas: Unremarkable. No pancreatic ductal dilatation or surrounding inflammatory changes. Spleen: Normal in size without significant abnormality. Adrenals/Urinary Tract: Adrenal glands are unremarkable. Small nonobstructive calculus in the midportion of the left kidney. Simple bilateral renal cysts. No hydronephrosis. Thickening of the urinary bladder, likely secondary to chronic outlet obstruction. Stomach/Bowel: Stomach is within normal limits. Appendix appears normal. No evidence of bowel wall thickening, distention, or inflammatory changes. Lymphatic: No enlarged abdominal or pelvic lymph nodes. Reproductive: Penile implant and right hemi abdominal reservoir.  Other: No abdominal wall  hernia or abnormality. No abdominopelvic ascites. Musculoskeletal: No acute or significant osseous findings. Review of the MIP images confirms the above findings. IMPRESSION: 1. Normal contour and caliber of the thoracic and abdominal aorta. No evidence of aneurysm, dissection, or other acute aortic pathology. Moderate mixed calcific atherosclerosis. 2. The mid and lower esophagus is thickened and fluid-filled, suggestive of reflux esophagitis. Malignancy is difficult to strictly exclude. 3. Mild emphysema. 4. Background of very fine centrilobular nodularity, consistent with smoking-related respiratory bronchiolitis. 5. Diffuse bilateral bronchial wall thickening, consistent with nonspecific infectious or inflammatory bronchitis. 6. Coronary artery disease. 7. Small nonobstructive left renal calculus. Aortic Atherosclerosis (ICD10-I70.0) and Emphysema (ICD10-J43.9). Electronically Signed   By: Eddie Candle M.D.   On: 07/30/2020 16:14    EKG: Independently reviewed.  EKG shows sinus bradycardia with a rate of 48, right bundle branch block, no significant ST change and no significant change from previous  Assessment/Plan Principal Problem:   Syncope Active Problems:   Coronary artery disease involving native coronary artery of native heart with unstable angina pectoris (HCC)   Gastroesophageal reflux disease   Essential hypertension   COPD (chronic obstructive pulmonary disease) (HCC)   Achalasia of esophagus     #1 presyncope: Probably cardiac in nature.  Patient has known cardiac disease.  Admit the patient for observation.  Get echocardiogram.  Monitor on telemetry.  So far appears to be back to baseline.  Not orthostatic.  Follow-up with cardiology outpatient.  #2 angina: Transient.  Patient has known cardiac disease which is inoperable.  Probably had stable angina on and off.  At this point chest pain-free.  Cycle enzymes on monitor.  Continue Imdur and Ranexa  #3 essential hypertension: On  Imdur, also on metoprolol.  #4 GERD: Patient on Protonix.  We will continue.  #5 hyperlipidemia: On Crestor 10 mg.  Continue  #6 BPH: Continue with Proscar.  #7 COPD: No exacerbation.  Continue home regimen  #8 depression with anxiety: Continue Wellbutrin.   DVT prophylaxis: Lovenox Code Status: Full code Family Communication: No family at bedside Disposition Plan: Home Consults called: None Admission status: Observation  Severity of Illness: The appropriate patient status for this patient is OBSERVATION. Observation status is judged to be reasonable and necessary in order to provide the required intensity of service to ensure the patient's safety. The patient's presenting symptoms, physical exam findings, and initial radiographic and laboratory data in the context of their medical condition is felt to place them at decreased risk for further clinical deterioration. Furthermore, it is anticipated that the patient will be medically stable for discharge from the hospital within 2 midnights of admission. The following factors support the patient status of observation.   " The patient's presenting symptoms include chest pain and near syncope. " The physical exam findings include no significant finding on exam. " The initial radiographic and laboratory data are mostly within normal.     Dillyn Menna,LAWAL MD [facility] Pager [phone]  If 7PM-7AM, please contact night-coverage [email or web address] Password [email or web address]  07/31/2020, 12:41 AM

## 2020-07-31 ENCOUNTER — Observation Stay (HOSPITAL_BASED_OUTPATIENT_CLINIC_OR_DEPARTMENT_OTHER): Payer: Medicare Other

## 2020-07-31 DIAGNOSIS — R55 Syncope and collapse: Secondary | ICD-10-CM | POA: Diagnosis not present

## 2020-07-31 DIAGNOSIS — R079 Chest pain, unspecified: Secondary | ICD-10-CM

## 2020-07-31 LAB — ECHOCARDIOGRAM COMPLETE
AR max vel: 2.77 cm2
AV Area VTI: 2.69 cm2
AV Area mean vel: 2.73 cm2
AV Mean grad: 2 mmHg
AV Peak grad: 3 mmHg
Ao pk vel: 0.87 m/s
Area-P 1/2: 3.31 cm2
Height: 69 in
S' Lateral: 3.5 cm
Weight: 2848 oz

## 2020-07-31 LAB — SARS CORONAVIRUS 2 (TAT 6-24 HRS): SARS Coronavirus 2: NEGATIVE

## 2020-07-31 MED ORDER — PERFLUTREN LIPID MICROSPHERE
1.0000 mL | INTRAVENOUS | Status: AC | PRN
  Administered 2020-07-31: 10 mL via INTRAVENOUS
  Filled 2020-07-31: qty 10

## 2020-07-31 NOTE — Plan of Care (Signed)

## 2020-07-31 NOTE — Discharge Summary (Addendum)
Physician Discharge Summary  [name] [name] AOZ:[id number] DOB: 1940-03-16 DOA: 07/30/2020  PCP: [name], MD  Admit date: 07/30/2020 Discharge date: 07/31/2020  Admitted From: Home Disposition: Home  Recommendations for Outpatient Follow-up:  1. Follow up with PCP in 1-2 weeks    Discharge Condition: Stable CODE STATUS: Full code  diet recommendation: Low-sodium  Brief/Interim Summary: 80 years old male patient with a history of COPD, coronary artery disease, degenerative disc disease, obstructive sleep apnea, history of previous compression fractures, anxiety disorder and BPH was admitted on 6 5/22 after he presented with shortness of breath and chest pain.  On admission, serial cardiac enzymes were obtained and were unremarkable.  Chest pain is felt to be secondary to stable angina.  He has history of coronary artery disease which is inoperable.  Patient is currently chest pain-free and hemodynamically stable for discharge to home today.  Discharge Diagnoses:  Principal Problem:   Syncope Active Problems:   Coronary artery disease involving native coronary artery of native heart with unstable angina pectoris (HCC)   Gastroesophageal reflux disease   Essential hypertension   COPD (chronic obstructive pulmonary disease) (HCC)   Achalasia of esophagus    Discharge Instructions  Discharge Instructions    Call MD for:  extreme fatigue   Complete by: As directed    Call MD for:  severe uncontrolled pain   Complete by: As directed    Diet - low sodium heart healthy   Complete by: As directed    Discharge instructions   Complete by: As directed    Follow up with PCP in 7 to 10 days   Increase activity slowly   Complete by: As directed      Allergies as of 07/31/2020      Reactions   Doxycycline Other (See Comments)   Dizziness   Flomax [tamsulosin] Other (See Comments)   Dizziness   Lipitor [atorvastatin] Other (See Comments)   Muscle weakness   Rhubarb Other (See  Comments)   From childhood- exact reaction not recalled, but is allergic   Sulfa Antibiotics Hives   Sulfacetamide Sodium Hives   Clindamycin/lincomycin Rash   Other Hives, Itching, Rash, Other (See Comments)   UNNAMED ANTIBIOTIC- SEVERE SYMPTOMS [place]- From childhood- exact reaction not recalled, but is allergic   Vytorin [ezetimibe-simvastatin] Other (See Comments)   Bodyaches      Medication List    TAKE these medications   aspirin EC 81 MG tablet Take 1 tablet (81 mg total) by mouth daily. Resume after 4 days. What changed: additional instructions   buPROPion 100 MG tablet Commonly known as: WELLBUTRIN Take 200 mg by mouth at bedtime.   docusate sodium 100 MG capsule Commonly known as: COLACE Take 100 mg by mouth 2 (two) times daily as needed for mild constipation.   finasteride 5 MG tablet Commonly known as: PROSCAR Take 5 mg by mouth at bedtime.   HYDROcodone-acetaminophen 5-325 MG tablet Commonly known as: NORCO/VICODIN Take 1 tablet by mouth every 6 (six) hours as needed for moderate pain.   isosorbide mononitrate 60 MG 24 hr tablet Commonly known as: IMDUR Take 1.5 tablets (90 mg total) by mouth daily. Please keep upcoming appt in June 2022 with Cardiologist before anymore refills. Thank you What changed: additional instructions   metoprolol tartrate 25 MG tablet Commonly known as: LOPRESSOR Take 0.5 tablets (12.5 mg total) by mouth 2 (two) times daily.   multivitamin with minerals Tabs tablet Take 1 tablet by mouth daily.   nitroGLYCERIN  0.4 MG SL tablet Commonly known as: NITROSTAT Place 0.4 mg under the tongue See admin instructions. Dissolve 0.4 mg sublingually 10 minutes before each meal for esophageal spasms and as needed for chest pain   ondansetron 4 MG tablet Commonly known as: ZOFRAN Take 4 mg by mouth every 8 (eight) hours as needed for nausea or vomiting.   pantoprazole 40 MG tablet Commonly known as: PROTONIX Take 40 mg by mouth 2  (two) times daily with a meal.   polyethylene glycol powder 17 GM/SCOOP powder Commonly known as: GLYCOLAX/MIRALAX Take 17 g by mouth daily as needed for mild constipation (MIX AND DRINK).   ranolazine 1000 MG SR tablet Commonly known as: Ranexa Take 1 tablet (1,000 mg total) by mouth 2 (two) times daily.   rosuvastatin 10 MG tablet Commonly known as: CRESTOR Take 10 mg by mouth daily.   traMADol 50 MG tablet Commonly known as: Ultram Take 1 tablet (50 mg total) by mouth every 6 (six) hours as needed. What changed: reasons to take this   vitamin B-12 1000 MCG tablet Commonly known as: CYANOCOBALAMIN Take 1,000 mcg by mouth daily.   Vitamin D3 50 MCG (2000 UT) Tabs Take 2,000 Units by mouth in the morning.       Follow-up Information    [name], MD.   Specialty: Internal Medicine Contact information: [address] [place] [address] [phone]              Allergies  Allergen Reactions  . Doxycycline Other (See Comments)    Dizziness  . Flomax [Tamsulosin] Other (See Comments)    Dizziness   . Lipitor [Atorvastatin] Other (See Comments)    Muscle weakness  . Rhubarb Other (See Comments)    From childhood- exact reaction not recalled, but is allergic  . Sulfa Antibiotics Hives  . Sulfacetamide Sodium Hives  . Clindamycin/Lincomycin Rash  . Other Hives, Itching, Rash and Other (See Comments)    UNNAMED ANTIBIOTIC- SEVERE SYMPTOMS  [place]- From childhood- exact reaction not recalled, but is allergic  . Vytorin [Ezetimibe-Simvastatin] Other (See Comments)    Bodyaches    Consultations:  None   Procedures/Studies: DG Chest 2 View  Result Date: 07/30/2020 CLINICAL DATA:  Chest pain with nausea and vomiting EXAM: CHEST - 2 VIEW COMPARISON:  March 08, 2020 FINDINGS: Lungs are clear. Heart size and pulmonary vascularity are normal. No adenopathy. There is aortic atherosclerosis. There is degenerative change in the thoracic  spine. No pneumothorax. IMPRESSION: Lungs clear. Heart size normal. Aortic Atherosclerosis (ICD10-I70.0). Electronically Signed   By: Lowella Grip III M.D.   On: 07/30/2020 13:40   ECHOCARDIOGRAM COMPLETE  Result Date: 07/31/2020    ECHOCARDIOGRAM REPORT   Patient Name:   [name] [name] Date of Exam: 07/31/2020 Medical Rec #:  [id number]    Height:       69.0 in Accession #:    [id number]   Weight:       178.0 lb Date of Birth:  08-05-1940    BSA:          1.966 m Patient Age:    62 years     BP:           148/68 mmHg Patient Gender: M            HR:           58 bpm. Exam Location:  Inpatient Procedure: 2D Echo, Cardiac Doppler and Color Doppler Indications:  Chest pain  History:        Patient has prior history of Echocardiogram examinations, most                 recent 11/20/2019. COPD; Risk Factors:Current Smoker,                 Hypertension and Dyslipidemia.  Sonographer:    Cammy Brochure Referring Phys: [phone] [facility] Julious Oka  Sonographer Comments: Suboptimal apical window. Image acquisition challenging due to patient body habitus and Image acquisition challenging due to COPD. IMPRESSIONS  1. Left ventricular ejection fraction, by estimation, is 50 to 55%. The left ventricle has low normal function. The left ventricle has no regional wall motion abnormalities. There is mild left ventricular hypertrophy. Left ventricular diastolic parameters are indeterminate.  2. Right ventricular systolic function is normal. The right ventricular size is normal. Tricuspid regurgitation signal is inadequate for assessing PA pressure.  3. The mitral valve is grossly normal. No evidence of mitral valve regurgitation. No evidence of mitral stenosis.  4. The aortic valve is grossly normal. There is mild calcification of the aortic valve. There is mild thickening of the aortic valve. Aortic valve regurgitation is not visualized. Mild aortic valve sclerosis is present, with no evidence of aortic valve stenosis.  5. The  inferior vena cava is normal in size with greater than 50% respiratory variability, suggesting right atrial pressure of 3 mmHg. Comparison(s): No significant change from prior study. Conclusion(s)/Recommendation(s): Otherwise normal echocardiogram, with minor abnormalities described in the report. FINDINGS  Left Ventricle: Left ventricular ejection fraction, by estimation, is 50 to 55%. The left ventricle has low normal function. The left ventricle has no regional wall motion abnormalities. Definity contrast agent was given IV to delineate the left ventricular endocardial borders. The left ventricular internal cavity size was normal in size. There is mild left ventricular hypertrophy. Left ventricular diastolic parameters are indeterminate. Right Ventricle: The right ventricular size is normal. Right vetricular wall thickness was not well visualized. Right ventricular systolic function is normal. Tricuspid regurgitation signal is inadequate for assessing PA pressure. Left Atrium: Left atrial size was not well visualized. Right Atrium: Right atrial size was not well visualized. Pericardium: There is no evidence of pericardial effusion. Presence of pericardial fat pad. Mitral Valve: The mitral valve is grossly normal. No evidence of mitral valve regurgitation. No evidence of mitral valve stenosis. Tricuspid Valve: The tricuspid valve is grossly normal. Tricuspid valve regurgitation is trivial. No evidence of tricuspid stenosis. Aortic Valve: The aortic valve is grossly normal. There is mild calcification of the aortic valve. There is mild thickening of the aortic valve. Aortic valve regurgitation is not visualized. Mild aortic valve sclerosis is present, with no evidence of aortic valve stenosis. Aortic valve mean gradient measures 2.0 mmHg. Aortic valve peak gradient measures 3.0 mmHg. Aortic valve area, by VTI measures 2.69 cm. Pulmonic Valve: The pulmonic valve was not well visualized. Pulmonic valve regurgitation  is not visualized. No evidence of pulmonic stenosis. Aorta: The aortic root and ascending aorta are structurally normal, with no evidence of dilitation. Venous: The inferior vena cava is normal in size with greater than 50% respiratory variability, suggesting right atrial pressure of 3 mmHg. IAS/Shunts: The interatrial septum was not well visualized.  LEFT VENTRICLE PLAX 2D LVIDd:         4.80 cm  Diastology LVIDs:         3.50 cm  LV e' medial:    7.18 cm/s LV PW:  1.20 cm  LV E/e' medial:  8.5 LV IVS:        1.40 cm  LV e' lateral:   8.70 cm/s LVOT diam:     2.10 cm  LV E/e' lateral: 7.0 LV SV:         58 LV SV Index:   30 LVOT Area:     3.46 cm  RIGHT VENTRICLE             IVC RV S prime:     14.90 cm/s  IVC diam: 1.50 cm TAPSE (M-mode): 2.9 cm LEFT ATRIUM         Index LA diam:    3.60 cm 1.83 cm/m  AORTIC VALVE AV Area (Vmax):    2.77 cm AV Area (Vmean):   2.73 cm AV Area (VTI):     2.69 cm AV Vmax:           86.90 cm/s AV Vmean:          56.500 cm/s AV VTI:            0.216 m AV Peak Grad:      3.0 mmHg AV Mean Grad:      2.0 mmHg LVOT Vmax:         69.40 cm/s LVOT Vmean:        44.600 cm/s LVOT VTI:          0.168 m LVOT/AV VTI ratio: 0.78  AORTA Ao Root diam: 3.30 cm Ao Asc diam:  3.20 cm MITRAL VALVE MV Area (PHT): 3.31 cm    SHUNTS MV Decel Time: 229 msec    Systemic VTI:  0.17 m MV E velocity: 61.30 cm/s  Systemic Diam: 2.10 cm MV A velocity: 69.80 cm/s MV E/A ratio:  0.88 Buford Dresser MD Electronically signed by Buford Dresser MD Signature Date/Time: 07/31/2020/12:07:00 PM    Final    CT Angio Chest/Abd/Pel for Dissection W and/or Wo Contrast  Result Date: 07/30/2020 CLINICAL DATA:  Abdominal pain, aortic dissection suspected EXAM: CT ANGIOGRAPHY CHEST, ABDOMEN AND PELVIS TECHNIQUE: Non-contrast CT of the chest was initially obtained. Multidetector CT imaging through the chest, abdomen and pelvis was performed using the standard protocol during bolus administration of  intravenous contrast. Multiplanar reconstructed images and MIPs were obtained and reviewed to evaluate the vascular anatomy. CONTRAST:  78mL OMNIPAQUE IOHEXOL 350 MG/ML SOLN COMPARISON:  11/19/2019 FINDINGS: CTA CHEST FINDINGS Cardiovascular: Preferential opacification of the thoracic aorta. Normal contour and caliber of the thoracic aorta. No evidence of aneurysm, dissection, or other acute aortic pathology. Mild mixed calcific atherosclerosis. Normal heart size. Three-vessel coronary artery calcifications. No pericardial effusion. Mediastinum/Nodes: No enlarged mediastinal, hilar, or axillary lymph nodes. The mid and lower esophagus is thickened and fluid-filled. Thyroid gland and trachea demonstrate no significant findings. Lungs/Pleura: Mild centrilobular and paraseptal emphysema. Background of very fine centrilobular nodularity. Diffuse bilateral bronchial wall thickening. No pleural effusion or pneumothorax. Musculoskeletal: No chest wall abnormality. No acute or significant osseous findings. Review of the MIP images confirms the above findings. CTA ABDOMEN AND PELVIS FINDINGS VASCULAR Normal contour and caliber of the abdominal aorta. No evidence of aneurysm, dissection, or other acute aortic pathology. Standard branching pattern of the abdominal aorta, with solitary bilateral renal arteries. Moderate mixed calcific atherosclerosis. Review of the MIP images confirms the above findings. NON-VASCULAR Hepatobiliary: No solid liver abnormality is seen. No gallstones, gallbladder wall thickening, or biliary dilatation. Pancreas: Unremarkable. No pancreatic ductal dilatation or surrounding inflammatory changes. Spleen: Normal in size without  significant abnormality. Adrenals/Urinary Tract: Adrenal glands are unremarkable. Small nonobstructive calculus in the midportion of the left kidney. Simple bilateral renal cysts. No hydronephrosis. Thickening of the urinary bladder, likely secondary to chronic outlet  obstruction. Stomach/Bowel: Stomach is within normal limits. Appendix appears normal. No evidence of bowel wall thickening, distention, or inflammatory changes. Lymphatic: No enlarged abdominal or pelvic lymph nodes. Reproductive: Penile implant and right hemi abdominal reservoir. Other: No abdominal wall hernia or abnormality. No abdominopelvic ascites. Musculoskeletal: No acute or significant osseous findings. Review of the MIP images confirms the above findings. IMPRESSION: 1. Normal contour and caliber of the thoracic and abdominal aorta. No evidence of aneurysm, dissection, or other acute aortic pathology. Moderate mixed calcific atherosclerosis. 2. The mid and lower esophagus is thickened and fluid-filled, suggestive of reflux esophagitis. Malignancy is difficult to strictly exclude. 3. Mild emphysema. 4. Background of very fine centrilobular nodularity, consistent with smoking-related respiratory bronchiolitis. 5. Diffuse bilateral bronchial wall thickening, consistent with nonspecific infectious or inflammatory bronchitis. 6. Coronary artery disease. 7. Small nonobstructive left renal calculus. Aortic Atherosclerosis (ICD10-I70.0) and Emphysema (ICD10-J43.9). Electronically Signed   By: Eddie Candle M.D.   On: 07/30/2020 16:14       Subjective: Patient feels better.  No chest pain or shortness of breath.   Discharge Exam: Vitals:   07/31/20 0928 07/31/20 1014  BP: (!) 142/72 121/62  Pulse: (!) 57 (!) 56  Resp:  18  Temp:  98.1 F (36.7 C)  SpO2:  97%   Vitals:   07/31/20 0428 07/31/20 0839 07/31/20 0928 07/31/20 1014  BP: (!) 148/68 140/75 (!) 142/72 121/62  Pulse: (!) 58  (!) 57 (!) 56  Resp: 19   18  Temp: (!) 97.5 F (36.4 C)   98.1 F (36.7 C)  TempSrc: Oral   Oral  SpO2: 93%   97%  Weight: 80.7 kg     Height:        General: Pt is alert, awake, not in acute distress Cardiovascular: RRR, S1/S2 +, no rubs, no gallops Respiratory: CTA bilaterally, no wheezing, no  rhonchi Abdominal: Soft, NT, ND, bowel sounds + Extremities: no edema, no cyanosis    The results of significant diagnostics from this hospitalization (including imaging, microbiology, ancillary and laboratory) are listed below for reference.     Microbiology: Recent Results (from the past 240 hour(s))  SARS CORONAVIRUS 2 (TAT 6-24 HRS) Nasopharyngeal Nasopharyngeal Swab     Status: None   Collection Time: 07/30/20  2:45 PM   Specimen: Nasopharyngeal Swab  Result Value Ref Range Status   SARS Coronavirus 2 NEGATIVE NEGATIVE Final    Comment: (NOTE) SARS-CoV-2 target nucleic acids are NOT DETECTED.  The SARS-CoV-2 RNA is generally detectable in upper and lower respiratory specimens during the acute phase of infection. Negative results do not preclude SARS-CoV-2 infection, do not rule out co-infections with other pathogens, and should not be used as the sole basis for treatment or other patient management decisions. Negative results must be combined with clinical observations, patient history, and epidemiological information. The expected result is Negative.  Fact Sheet for Patients: SugarRoll.be  Fact Sheet for Healthcare Providers: https://www.woods-mathews.com/  This test is not yet approved or cleared by the [place] FDA and  has been authorized for detection and/or diagnosis of SARS-CoV-2 by FDA under an Emergency Use Authorization (EUA). This EUA will remain  in effect (meaning this test can be used) for the duration of the COVID-19 declaration under Se ction 564(b)(1) of the Act,  21 U.S.C. section 360bbb-3(b)(1), unless the authorization is terminated or revoked sooner.  Performed at [facility], [place] [address], [place], [place] [address]      Labs: BNP (last 3 results) No results for input(s): BNP in the last 8760 hours. Basic Metabolic Panel: Recent Labs  Lab 07/30/20 1301 07/30/20 2039  NA 137  --   K  4.2  --   CL 102  --   CO2 28  --   GLUCOSE 110*  --   BUN 17  --   CREATININE 0.88 0.86  CALCIUM 9.2  --    Liver Function Tests: Recent Labs  Lab 07/30/20 2039  AST 16  ALT 15  ALKPHOS 62  BILITOT 0.6  PROT 6.5  ALBUMIN 3.1*   Recent Labs  Lab 07/30/20 2039  LIPASE 21   No results for input(s): AMMONIA in the last 168 hours. CBC: Recent Labs  Lab 07/30/20 1301 07/30/20 2039  WBC 5.9 5.7  HGB 12.0* 12.5*  HCT 36.6* 38.2*  MCV 101.1* 100.3*  PLT 171 172   Cardiac Enzymes: No results for input(s): CKTOTAL, CKMB, CKMBINDEX, TROPONINI in the last 168 hours. BNP: Invalid input(s): POCBNP CBG: No results for input(s): GLUCAP in the last 168 hours. D-Dimer No results for input(s): DDIMER in the last 72 hours. Hgb A1c No results for input(s): HGBA1C in the last 72 hours. Lipid Profile Recent Labs    07/30/20 2039  CHOL 125  HDL 44  LDLCALC 59  TRIG 109  CHOLHDL 2.8   Thyroid function studies No results for input(s): TSH, T4TOTAL, T3FREE, THYROIDAB in the last 72 hours.  Invalid input(s): FREET3 Anemia work up No results for input(s): VITAMINB12, FOLATE, FERRITIN, TIBC, IRON, RETICCTPCT in the last 72 hours. Urinalysis    Component Value Date/Time   COLORURINE AMBER (A) 11/19/2019 1427   APPEARANCEUR HAZY (A) 11/19/2019 1427   LABSPEC 1.009 11/19/2019 1427   PHURINE 5.0 11/19/2019 1427   GLUCOSEU NEGATIVE 11/19/2019 1427   HGBUR LARGE (A) 11/19/2019 1427   BILIRUBINUR NEGATIVE 11/19/2019 1427   KETONESUR NEGATIVE 11/19/2019 1427   PROTEINUR 30 (A) 11/19/2019 1427   UROBILINOGEN 1.0 01/31/2009 1427   NITRITE NEGATIVE 11/19/2019 1427   LEUKOCYTESUR LARGE (A) 11/19/2019 1427   Sepsis Labs Invalid input(s): PROCALCITONIN,  WBC,  LACTICIDVEN Microbiology Recent Results (from the past 240 hour(s))  SARS CORONAVIRUS 2 (TAT 6-24 HRS) Nasopharyngeal Nasopharyngeal Swab     Status: None   Collection Time: 07/30/20  2:45 PM   Specimen: Nasopharyngeal  Swab  Result Value Ref Range Status   SARS Coronavirus 2 NEGATIVE NEGATIVE Final    Comment: (NOTE) SARS-CoV-2 target nucleic acids are NOT DETECTED.  The SARS-CoV-2 RNA is generally detectable in upper and lower respiratory specimens during the acute phase of infection. Negative results do not preclude SARS-CoV-2 infection, do not rule out co-infections with other pathogens, and should not be used as the sole basis for treatment or other patient management decisions. Negative results must be combined with clinical observations, patient history, and epidemiological information. The expected result is Negative.  Fact Sheet for Patients: SugarRoll.be  Fact Sheet for Healthcare Providers: https://www.woods-mathews.com/  This test is not yet approved or cleared by the [place] FDA and  has been authorized for detection and/or diagnosis of SARS-CoV-2 by FDA under an Emergency Use Authorization (EUA). This EUA will remain  in effect (meaning this test can be used) for the duration of the COVID-19 declaration under Se ction  564(b)(1) of the Act, 21 U.S.C. section 360bbb-3(b)(1), unless the authorization is terminated or revoked sooner.  Performed at [facility], [place] [address] [place], [place] [address]      Time coordinating discharge: 35 minutes  SIGNED:   Elvin So, MD  [facility] 07/31/2020, 3:28 PM

## 2020-07-31 NOTE — Progress Notes (Signed)
D/C instructions given and reviewed. Tele and IV removed, tolerated well. Awaiting family to transport home.

## 2020-08-04 DIAGNOSIS — I252 Old myocardial infarction: Secondary | ICD-10-CM | POA: Diagnosis not present

## 2020-08-04 DIAGNOSIS — R5383 Other fatigue: Secondary | ICD-10-CM | POA: Diagnosis not present

## 2020-08-04 DIAGNOSIS — I2510 Atherosclerotic heart disease of native coronary artery without angina pectoris: Secondary | ICD-10-CM | POA: Diagnosis not present

## 2020-08-04 DIAGNOSIS — I700 Atherosclerosis of aorta: Secondary | ICD-10-CM | POA: Diagnosis not present

## 2020-08-04 DIAGNOSIS — E861 Hypovolemia: Secondary | ICD-10-CM | POA: Diagnosis not present

## 2020-08-04 DIAGNOSIS — Z79891 Long term (current) use of opiate analgesic: Secondary | ICD-10-CM | POA: Diagnosis not present

## 2020-08-04 DIAGNOSIS — M069 Rheumatoid arthritis, unspecified: Secondary | ICD-10-CM | POA: Diagnosis not present

## 2020-08-04 DIAGNOSIS — Z7984 Long term (current) use of oral hypoglycemic drugs: Secondary | ICD-10-CM | POA: Diagnosis not present

## 2020-08-04 DIAGNOSIS — I509 Heart failure, unspecified: Secondary | ICD-10-CM | POA: Diagnosis not present

## 2020-08-04 DIAGNOSIS — Z7902 Long term (current) use of antithrombotics/antiplatelets: Secondary | ICD-10-CM | POA: Diagnosis not present

## 2020-08-04 DIAGNOSIS — R079 Chest pain, unspecified: Secondary | ICD-10-CM | POA: Diagnosis not present

## 2020-08-04 DIAGNOSIS — I4510 Unspecified right bundle-branch block: Secondary | ICD-10-CM | POA: Diagnosis not present

## 2020-08-04 DIAGNOSIS — Z79899 Other long term (current) drug therapy: Secondary | ICD-10-CM | POA: Diagnosis not present

## 2020-08-04 DIAGNOSIS — I110 Hypertensive heart disease with heart failure: Secondary | ICD-10-CM | POA: Diagnosis not present

## 2020-08-04 DIAGNOSIS — Z20822 Contact with and (suspected) exposure to covid-19: Secondary | ICD-10-CM | POA: Diagnosis not present

## 2020-08-04 DIAGNOSIS — J439 Emphysema, unspecified: Secondary | ICD-10-CM | POA: Diagnosis not present

## 2020-08-04 DIAGNOSIS — K220 Achalasia of cardia: Secondary | ICD-10-CM | POA: Diagnosis not present

## 2020-08-04 DIAGNOSIS — R9431 Abnormal electrocardiogram [ECG] [EKG]: Secondary | ICD-10-CM | POA: Diagnosis not present

## 2020-08-04 DIAGNOSIS — E119 Type 2 diabetes mellitus without complications: Secondary | ICD-10-CM | POA: Diagnosis not present

## 2020-08-04 DIAGNOSIS — Z7982 Long term (current) use of aspirin: Secondary | ICD-10-CM | POA: Diagnosis not present

## 2020-08-04 DIAGNOSIS — R6521 Severe sepsis with septic shock: Secondary | ICD-10-CM | POA: Diagnosis not present

## 2020-08-04 DIAGNOSIS — A419 Sepsis, unspecified organism: Secondary | ICD-10-CM | POA: Diagnosis not present

## 2020-08-04 DIAGNOSIS — K5900 Constipation, unspecified: Secondary | ICD-10-CM | POA: Diagnosis not present

## 2020-08-04 DIAGNOSIS — R001 Bradycardia, unspecified: Secondary | ICD-10-CM | POA: Diagnosis not present

## 2020-08-05 ENCOUNTER — Encounter (HOSPITAL_COMMUNITY): Payer: Self-pay | Admitting: Pulmonary Disease

## 2020-08-05 ENCOUNTER — Inpatient Hospital Stay (HOSPITAL_COMMUNITY)
Admission: AD | Admit: 2020-08-05 | Discharge: 2020-08-09 | DRG: 871 | Disposition: A | Discharge status: Home Health | Payer: Medicare Other | Source: Other Acute Inpatient Hospital | Attending: Internal Medicine | Admitting: Internal Medicine

## 2020-08-05 ENCOUNTER — Inpatient Hospital Stay (HOSPITAL_COMMUNITY): Payer: Medicare Other

## 2020-08-05 ENCOUNTER — Other Ambulatory Visit: Payer: Self-pay

## 2020-08-05 DIAGNOSIS — I10 Essential (primary) hypertension: Secondary | ICD-10-CM | POA: Diagnosis present

## 2020-08-05 DIAGNOSIS — I252 Old myocardial infarction: Secondary | ICD-10-CM

## 2020-08-05 DIAGNOSIS — R531 Weakness: Secondary | ICD-10-CM | POA: Diagnosis not present

## 2020-08-05 DIAGNOSIS — J440 Chronic obstructive pulmonary disease with acute lower respiratory infection: Secondary | ICD-10-CM | POA: Diagnosis not present

## 2020-08-05 DIAGNOSIS — J189 Pneumonia, unspecified organism: Secondary | ICD-10-CM | POA: Diagnosis present

## 2020-08-05 DIAGNOSIS — I2510 Atherosclerotic heart disease of native coronary artery without angina pectoris: Secondary | ICD-10-CM | POA: Diagnosis not present

## 2020-08-05 DIAGNOSIS — K222 Esophageal obstruction: Secondary | ICD-10-CM | POA: Diagnosis not present

## 2020-08-05 DIAGNOSIS — J449 Chronic obstructive pulmonary disease, unspecified: Secondary | ICD-10-CM | POA: Diagnosis not present

## 2020-08-05 DIAGNOSIS — F17210 Nicotine dependence, cigarettes, uncomplicated: Secondary | ICD-10-CM | POA: Diagnosis not present

## 2020-08-05 DIAGNOSIS — Z882 Allergy status to sulfonamides status: Secondary | ICD-10-CM

## 2020-08-05 DIAGNOSIS — A419 Sepsis, unspecified organism: Secondary | ICD-10-CM | POA: Diagnosis not present

## 2020-08-05 DIAGNOSIS — Z9841 Cataract extraction status, right eye: Secondary | ICD-10-CM

## 2020-08-05 DIAGNOSIS — E860 Dehydration: Secondary | ICD-10-CM | POA: Diagnosis present

## 2020-08-05 DIAGNOSIS — Z833 Family history of diabetes mellitus: Secondary | ICD-10-CM | POA: Diagnosis not present

## 2020-08-05 DIAGNOSIS — R7303 Prediabetes: Secondary | ICD-10-CM | POA: Diagnosis present

## 2020-08-05 DIAGNOSIS — K224 Dyskinesia of esophagus: Secondary | ICD-10-CM | POA: Diagnosis not present

## 2020-08-05 DIAGNOSIS — I959 Hypotension, unspecified: Secondary | ICD-10-CM | POA: Diagnosis present

## 2020-08-05 DIAGNOSIS — N400 Enlarged prostate without lower urinary tract symptoms: Secondary | ICD-10-CM | POA: Diagnosis present

## 2020-08-05 DIAGNOSIS — Z9842 Cataract extraction status, left eye: Secondary | ICD-10-CM | POA: Diagnosis not present

## 2020-08-05 DIAGNOSIS — Z8249 Family history of ischemic heart disease and other diseases of the circulatory system: Secondary | ICD-10-CM | POA: Diagnosis not present

## 2020-08-05 DIAGNOSIS — N179 Acute kidney failure, unspecified: Secondary | ICD-10-CM | POA: Diagnosis not present

## 2020-08-05 DIAGNOSIS — E785 Hyperlipidemia, unspecified: Secondary | ICD-10-CM | POA: Diagnosis not present

## 2020-08-05 DIAGNOSIS — D696 Thrombocytopenia, unspecified: Secondary | ICD-10-CM | POA: Diagnosis present

## 2020-08-05 DIAGNOSIS — I9589 Other hypotension: Secondary | ICD-10-CM | POA: Diagnosis not present

## 2020-08-05 DIAGNOSIS — Z96652 Presence of left artificial knee joint: Secondary | ICD-10-CM | POA: Diagnosis present

## 2020-08-05 DIAGNOSIS — F418 Other specified anxiety disorders: Secondary | ICD-10-CM | POA: Diagnosis present

## 2020-08-05 DIAGNOSIS — F419 Anxiety disorder, unspecified: Secondary | ICD-10-CM | POA: Diagnosis present

## 2020-08-05 DIAGNOSIS — G4733 Obstructive sleep apnea (adult) (pediatric): Secondary | ICD-10-CM | POA: Diagnosis not present

## 2020-08-05 DIAGNOSIS — Z961 Presence of intraocular lens: Secondary | ICD-10-CM | POA: Diagnosis present

## 2020-08-05 DIAGNOSIS — R1319 Other dysphagia: Secondary | ICD-10-CM | POA: Diagnosis not present

## 2020-08-05 DIAGNOSIS — R0602 Shortness of breath: Secondary | ICD-10-CM | POA: Diagnosis not present

## 2020-08-05 DIAGNOSIS — Z9989 Dependence on other enabling machines and devices: Secondary | ICD-10-CM | POA: Diagnosis not present

## 2020-08-05 DIAGNOSIS — Z7982 Long term (current) use of aspirin: Secondary | ICD-10-CM

## 2020-08-05 DIAGNOSIS — Z820 Family history of epilepsy and other diseases of the nervous system: Secondary | ICD-10-CM | POA: Diagnosis not present

## 2020-08-05 DIAGNOSIS — Z79899 Other long term (current) drug therapy: Secondary | ICD-10-CM

## 2020-08-05 DIAGNOSIS — R1310 Dysphagia, unspecified: Secondary | ICD-10-CM | POA: Diagnosis not present

## 2020-08-05 DIAGNOSIS — R933 Abnormal findings on diagnostic imaging of other parts of digestive tract: Secondary | ICD-10-CM | POA: Diagnosis not present

## 2020-08-05 DIAGNOSIS — K219 Gastro-esophageal reflux disease without esophagitis: Secondary | ICD-10-CM | POA: Diagnosis not present

## 2020-08-05 DIAGNOSIS — Z20822 Contact with and (suspected) exposure to covid-19: Secondary | ICD-10-CM | POA: Diagnosis not present

## 2020-08-05 DIAGNOSIS — F32A Depression, unspecified: Secondary | ICD-10-CM | POA: Diagnosis not present

## 2020-08-05 DIAGNOSIS — R0902 Hypoxemia: Secondary | ICD-10-CM

## 2020-08-05 DIAGNOSIS — Z888 Allergy status to other drugs, medicaments and biological substances status: Secondary | ICD-10-CM

## 2020-08-05 LAB — CBC WITH DIFFERENTIAL/PLATELET
Abs Immature Granulocytes: 0.47 10*3/uL — ABNORMAL HIGH (ref 0.00–0.07)
Basophils Absolute: 0 10*3/uL (ref 0.0–0.1)
Basophils Relative: 0 %
Eosinophils Absolute: 0 10*3/uL (ref 0.0–0.5)
Eosinophils Relative: 0 %
HCT: 34.4 % — ABNORMAL LOW (ref 39.0–52.0)
Hemoglobin: 11.5 g/dL — ABNORMAL LOW (ref 13.0–17.0)
Immature Granulocytes: 4 %
Lymphocytes Relative: 4 %
Lymphs Abs: 0.5 10*3/uL — ABNORMAL LOW (ref 0.7–4.0)
MCH: 32.9 pg (ref 26.0–34.0)
MCHC: 33.4 g/dL (ref 30.0–36.0)
MCV: 98.3 fL (ref 80.0–100.0)
Monocytes Absolute: 0.7 10*3/uL (ref 0.1–1.0)
Monocytes Relative: 6 %
Neutro Abs: 10 10*3/uL — ABNORMAL HIGH (ref 1.7–7.7)
Neutrophils Relative %: 86 %
Platelets: UNDETERMINED 10*3/uL (ref 150–400)
RBC: 3.5 MIL/uL — ABNORMAL LOW (ref 4.22–5.81)
RDW: 13.3 % (ref 11.5–15.5)
WBC: 11.6 10*3/uL — ABNORMAL HIGH (ref 4.0–10.5)
nRBC: 0 % (ref 0.0–0.2)

## 2020-08-05 LAB — COMPREHENSIVE METABOLIC PANEL
ALT: 17 U/L (ref 0–44)
AST: 20 U/L (ref 15–41)
Albumin: 2.4 g/dL — ABNORMAL LOW (ref 3.5–5.0)
Alkaline Phosphatase: 46 U/L (ref 38–126)
Anion gap: 10 (ref 5–15)
BUN: 51 mg/dL — ABNORMAL HIGH (ref 8–23)
CO2: 24 mmol/L (ref 22–32)
Calcium: 8.8 mg/dL — ABNORMAL LOW (ref 8.9–10.3)
Chloride: 102 mmol/L (ref 98–111)
Creatinine, Ser: 2.71 mg/dL — ABNORMAL HIGH (ref 0.61–1.24)
GFR, Estimated: 23 mL/min — ABNORMAL LOW (ref >60)
Glucose, Bld: 113 mg/dL — ABNORMAL HIGH (ref 70–99)
Potassium: 4.5 mmol/L (ref 3.5–5.1)
Sodium: 136 mmol/L (ref 135–145)
Total Bilirubin: 0.8 mg/dL (ref 0.3–1.2)
Total Protein: 5.7 g/dL — ABNORMAL LOW (ref 6.5–8.1)

## 2020-08-05 LAB — LACTIC ACID, PLASMA: Lactic Acid, Venous: 2 mmol/L (ref 0.5–1.9)

## 2020-08-05 LAB — TROPONIN I (HIGH SENSITIVITY): Troponin I (High Sensitivity): 15 ng/L (ref <18)

## 2020-08-05 LAB — CORTISOL: Cortisol, Plasma: 34.1 ug/dL

## 2020-08-05 LAB — MRSA NEXT GEN BY PCR, NASAL: MRSA by PCR Next Gen: NOT DETECTED

## 2020-08-05 LAB — PROCALCITONIN: Procalcitonin: 13.45 ng/mL

## 2020-08-05 LAB — MAGNESIUM: Magnesium: 2.1 mg/dL (ref 1.7–2.4)

## 2020-08-05 LAB — BRAIN NATRIURETIC PEPTIDE: B Natriuretic Peptide: 382.2 pg/mL — ABNORMAL HIGH (ref 0.0–100.0)

## 2020-08-05 MED ORDER — NOREPINEPHRINE 4 MG/250ML-% IV SOLN: 2.0000 ug/min | INTRAVENOUS | Status: DC

## 2020-08-05 MED ORDER — SODIUM CHLORIDE 0.9 % IV SOLN
500.0000 mg | Freq: Every day | INTRAVENOUS | Status: DC
  Administered 2020-08-05: 500 mg via INTRAVENOUS
  Filled 2020-08-05 (×3): qty 500

## 2020-08-05 MED ORDER — LACTATED RINGERS IV BOLUS: 500.0000 mL | Freq: Once | INTRAVENOUS | Status: DC

## 2020-08-05 MED ORDER — NITROGLYCERIN 0.4 MG SL SUBL
0.4000 mg | SUBLINGUAL_TABLET | SUBLINGUAL | Status: DC | PRN
  Administered 2020-08-06 – 2020-08-07 (×3): 0.4 mg via SUBLINGUAL
  Filled 2020-08-05 (×3): qty 1

## 2020-08-05 MED ORDER — NITROGLYCERIN 0.4 MG SL SUBL
0.4000 mg | SUBLINGUAL_TABLET | Freq: Three times a day (TID) | SUBLINGUAL | Status: DC
  Administered 2020-08-05 – 2020-08-09 (×11): 0.4 mg via SUBLINGUAL
  Filled 2020-08-05 (×11): qty 1

## 2020-08-05 MED ORDER — SODIUM CHLORIDE 0.9 % IV SOLN
2.0000 g | Freq: Every day | INTRAVENOUS | Status: DC
  Administered 2020-08-05 – 2020-08-06 (×2): 2 g via INTRAVENOUS
  Filled 2020-08-05: qty 2
  Filled 2020-08-05: qty 20
  Filled 2020-08-05: qty 2

## 2020-08-05 MED ORDER — MELATONIN 3 MG PO TABS
3.0000 mg | ORAL_TABLET | Freq: Every day | ORAL | Status: DC
  Administered 2020-08-05 – 2020-08-08 (×4): 3 mg via ORAL
  Filled 2020-08-05 (×4): qty 1

## 2020-08-05 MED ORDER — NITROGLYCERIN 0.4 MG SL SUBL: 0.4000 mg | SUBLINGUAL_TABLET | Freq: Three times a day (TID) | SUBLINGUAL | Status: DC

## 2020-08-05 MED ORDER — HEPARIN SODIUM (PORCINE) 5000 UNIT/ML IJ SOLN
5000.0000 [IU] | Freq: Three times a day (TID) | INTRAMUSCULAR | Status: DC
  Administered 2020-08-05 – 2020-08-07 (×7): 5000 [IU] via SUBCUTANEOUS
  Filled 2020-08-05 (×7): qty 1

## 2020-08-05 MED ORDER — CHLORHEXIDINE GLUCONATE CLOTH 2 % EX PADS
6.0000 | MEDICATED_PAD | Freq: Every day | CUTANEOUS | Status: DC
  Administered 2020-08-05 – 2020-08-09 (×4): 6 via TOPICAL

## 2020-08-05 MED ORDER — PANTOPRAZOLE SODIUM 40 MG IV SOLR
40.0000 mg | Freq: Every day | INTRAVENOUS | Status: DC
  Administered 2020-08-05: 40 mg via INTRAVENOUS
  Filled 2020-08-05: qty 40

## 2020-08-05 MED ORDER — SODIUM CHLORIDE 0.9 % IV SOLN
250.0000 mL | INTRAVENOUS | Status: DC
  Administered 2020-08-05: 250 mL via INTRAVENOUS

## 2020-08-05 NOTE — Progress Notes (Signed)
Patient arrived via mobile transport.  Placed on monitor.  Awake and alert follows commands, answers questions appropriately.  Levo gtt infusing at 50mcg/kg/min.  CCM paged upon patients arrival.

## 2020-08-05 NOTE — H&P (Signed)
NAME:  [name] [name], MRN:  [id number], DOB:  [birth year], LOS: 0 ADMISSION DATE:  08/05/2020, CONSULTATION DATE:  08/05/20 REFERRING MD: Roseanne Reno, CHIEF COMPLAINT:  Hypotension  History of Present Illness:  This is a 80 yo with history of CAD, COPD, Degenerative disc disease, OSA, compression fracture, anxiety, BPH who presented to [facility] ER for shortness of breath, cough with white sputum, fatigue, vomiting, some chest pain, and altered mental status.    Patient is a poor historian.  No family at bedside.  Reports symptoms started Friday but then possibly Wednesday.  Recently admitted 6/5-6/6 after syncopal episode with complaints of chest pain while at church with SOB.  Workup benign.  Reassuring TTE with LVEF 50-55%, mild LVH, no wall motion abnormalities, normal RV.    Daughter was able to be contacted.  She noted that patient did develop a mildly productive cough with some congestion.  Also the day after on Thursday of last week patient did have chills.  She notes that today patient became progressively more dizzy and weak this is after about 2 to 3 days of upset stomach after eating some spoiled ham.  He has been increasingly nauseous after the spoiled ham.  Difficulty keeping down foods.  Not taking much in terms of liquids either.  Patient reports chest pain but points to his epigastric and states hes had an MI before with similar feelings.  On further investigation, patient reports some vomiting when he tries to eat, develops pain and does feel like food gets stuck.  Endorses weight loss but unable to quantify.   At OSH patient was noted to be hypotensive. Given 1 liter of fluid but developed RLL crackles and JVD, therefore fluids were held.  Lactic acid was noted to be 2.9 despite fluid resuscitation.  Pro BNP was noted to 4000 but troponin neg.  Temp 99.3.  Covid negative.  Treated with duoneb and reglan.  Placed on NE for ongoing hypotension.  Cultures sent, started on cefepime and vanc.   Labs as below.  Transferred to [facility] for further evaluation, PCCM accepting given vasopressor requirements.    On chart review: patient had:   03/06/2020 DG esophogram/ barium swallow 1. Examination is positive for recurrent short segment distal esophageal stricture. 2. Moderate tertiary contractions with barium stasis and columnization to the level of the thoracic inlet. 3. Signs of mild barium aspiration noted.  03/09/2020  EGD w/ Dr. Watt Climes [facility] GI) - Normal larynx. - Tiny hiatal hernia. - ?Benign-appearing esophageal stenosis versus motility disorder in the distal esophagus. Dilated. - Normal stomach. - Normal duodenal bulb, first portion of the duodenum and second portion of the duodenum. - The examination was otherwise normal. - No specimens collected.  Pertinent  Medical History  CAD Tobacco abuse- 1 cigarette/ day COPD/ emphysema Degenerative disc disease OSA Compression fracture Anxiety BPH DM Arthritis Previous esophageal dilation/ Achalasia   Significant Hospital Events: Including procedures, antibiotic start and stop dates in addition to other pertinent events   Tx from [facility] ER to [facility] for hypotension/ on NE   [facility] [place] 6/10 Bcx 2 >> 6/10 Ucx >>  6/10 vanc >> 6/10 cefepime >>  Interim History / Subjective:  Arrived on NE 45mcg/min Remains on room air  Objective   Blood pressure 110/74, pulse 64, temperature 98.2 F (36.8 C), temperature source Oral, resp. rate 18, height 5\' 9"  (1.753 m), weight 83.3 kg, SpO2 95 %.        Intake/Output Summary (Last 24 hours) at 08/05/2020  9562 Last data filed at 08/05/2020 0300 Gross per 24 hour  Intake --  Output 250 ml  Net -250 ml   Filed Weights   08/05/20 0236  Weight: 83.3 kg    Examination: General: In no acute distress HENT: dry mucous membranes, pupils 3/reactive Lungs: non labored, clear, diminished in bases, no wheezing or rales appreciated Cardiovascular: RR, +1 distal pulses,  warm Abdomen: Soft non-tender and non distended, +bs Extremities: No deformities apprecited, no edema  Neuro: No focal deficits, lethargic, oriented to self and place, f/c   Labs/imaging that I havepersonally reviewed  (right click and "Reselect all SmartList Selections" daily)  From OSH notes:  CXR - non acute  EKG - NSR, RBBB BMET- K 133, k 3.8, bicarb 21, BUN 54, sCr 2.7 (most recently 0.86 on 6/6) LA 2.9-> 2.4 UA trace leukocytes , hazy, high sg, protein 30 CBC- WBC 7.2, Hgb 12.3, Hct 36.1, MCV 97, plt 107 Neg trop pBNP 4040 Mag 1.9, neg lipase, phos 4.8  Resolved Hospital Problem list   NA  Assessment & Plan:  This is a 80 year old male with history as noted above who presents for hypotension in the setting of a few days of vomiting.  Concern for septic shock per outside hospital.  Hypotension etiology yet to be elucidated.  Could be secondary to sepsis vs dehydration.  Additionally could be secondary to medication overuse.  Patient on metoprolol at home has been having some bradycardia that daughter notes. lactate initially 2.9 and improved to 2.4.   -Hold antihypertensives and beta-blockade in the setting of hypotension -We will repeat blood/ urine cultures here -Patient with dilated 9 collapsible IVC thus do not think merits further fluid resuscitation.  Continue Levophed for MAP of 65 -Start Rocephin and azithromycin for community-acquired pneumonia.  -Trend lactic acid, CMET, CBC, and check PCT - repeat troponin hs and EKG - CXR  -Repeat TTE in a.m.    Essential hypertension: On Imdur, and on metoprolol. -Hold in setting of hypotension   GERD Achalasia: Patient on Protonix.  status post dilation on 03/09/2020 -Continue protonix  -previous EGD in 02/2020 with ?benign-appearing esophageal stenosis versus motility disorder in the distal esophagus - consider GI consult/ repeat barium swallow  Hyperlipidemia: On Crestor 10 mg.  Continue    BPH: Continue with  Proscar.   COPD: No exacerbation.  Continue home regimen  Depression with anxiety: Continue Wellbutrin.   AKI baseline creatinine of 0.86 -At outside ED creatinine had increased to 2.80 -Avoid nephrotoxins -Monitor renal function with daily BMPs       Best practice (right click and "Reselect all SmartList Selections" daily)  Diet:  NPO Pain/Anxiety/Delirium protocol (if indicated): No VAP protocol (if indicated): Not indicated DVT prophylaxis: LMWH GI prophylaxis: PPI Glucose control:  SSI Yes Central venous access:  N/A Arterial line:  N/A Foley:  N/A Mobility:  bed rest  PT consulted: N/A Last date of multidisciplinary goals of care discussion Updated daughter who is the primary contact for the patient.  Her name is Charlestine Night Code Status:  full code Disposition: ICU  Labs   CBC: Recent Labs  Lab 07/30/20 1301 07/30/20 2039  WBC 5.9 5.7  HGB 12.0* 12.5*  HCT 36.6* 38.2*  MCV 101.1* 100.3*  PLT 171 130    Basic Metabolic Panel: Recent Labs  Lab 07/30/20 1301 07/30/20 2039  NA 137  --   K 4.2  --   CL 102  --   CO2 28  --  GLUCOSE 110*  --   BUN 17  --   CREATININE 0.88 0.86  CALCIUM 9.2  --    GFR: Estimated Creatinine Clearance: 68.5 mL/min (by C-G formula based on SCr of 0.86 mg/dL). Recent Labs  Lab 07/30/20 1301 07/30/20 2039  WBC 5.9 5.7    Liver Function Tests: Recent Labs  Lab 07/30/20 2039  AST 16  ALT 15  ALKPHOS 62  BILITOT 0.6  PROT 6.5  ALBUMIN 3.1*   Recent Labs  Lab 07/30/20 2039  LIPASE 21   No results for input(s): AMMONIA in the last 168 hours.  ABG No results found for: PHART, PCO2ART, PO2ART, HCO3, TCO2, ACIDBASEDEF, O2SAT   Coagulation Profile: No results for input(s): INR, PROTIME in the last 168 hours.  Cardiac Enzymes: No results for input(s): CKTOTAL, CKMB, CKMBINDEX, TROPONINI in the last 168 hours.  HbA1C: Hgb A1c MFr Bld  Date/Time Value Ref Range Status  12/13/2019 12:12 PM 5.4 4.8 -  5.6 % Final    Comment:    (NOTE)         Prediabetes: 5.7 - 6.4         Diabetes: >6.4         Glycemic control for adults with diabetes: <7.0     CBG: No results for input(s): GLUCAP in the last 168 hours.  Review of Systems:   Pertinent positives and negatives per HPI otherwise a 14 point review of systems is negative.  Past Medical History:  He,  has a past medical history of Adenomatous polyp (2007), Anginal pain ([place]), Anxiety, Arthritis, B12 deficiency, BPH (benign prostatic hyperplasia), Chronic back pain, Chronic low back pain, COPD (chronic obstructive pulmonary disease) ([place]), Coronary artery disease (2009), DDD (degenerative disc disease), cervical, DDD (degenerative disc disease), lumbosacral, DDD (degenerative disc disease), thoracic, Depression, Gastroesophageal reflux, Gynecomastia (06/2009), History of kidney stones, Hyperglycemia, Hyperlipidemia LDL goal < 70, MI (myocardial infarction) ([place]) (08/1989; 1993), OSA on CPAP, Peptic ulcer disease (1959), Pre-diabetes, Right bundle branch block, Tobacco abuse, and Vertebral compression fracture ([place]) (1970).   Surgical History:   Past Surgical History:  Procedure Laterality Date   BYPASS GRAFT POPLITEAL TO POPLITEAL Left 06/2005   Dr Donnetta Hutching   BYPASS GRAFT POPLITEAL TO POPLITEAL Right 02/2001   Dr Deon Pilling   CARDIAC CATHETERIZATION  1991; 1993; 08/2012; 11/2013; 02/02/2014   I've had 6-7 total   CARDIAC CATHETERIZATION  02/02/2014   Procedure: LEFT HEART CATH AND CORONARY ANGIOGRAPHY;  Surgeon: Jettie Booze, MD;  mild disease in LAD/CFX, RCA w/ CTO, unsuccessfull PCI both w/ antegrade and retrograde approaches    CARPAL TUNNEL RELEASE Left 06/01/2019   Procedure: LEFT CARPAL TUNNEL RELEASE;  Surgeon: Daryll Brod, MD;  Location: [facility];  Service: Orthopedics;  Laterality: Left;  FOREARM BLOCK   CATARACT EXTRACTION W/ INTRAOCULAR LENS  IMPLANT, BILATERAL Bilateral ~ 2012   COLONOSCOPY W/ POLYPECTOMY      CORONARY ANGIOPLASTY  1990's   CYSTOSCOPY WITH STENT PLACEMENT Right 11/19/2019   Procedure: CYSTOSCOPY WITH RIGHT URETERAL STENT PLACEMENT;  Surgeon: Lucas Mallow, MD;  Location: [facility];  Service: Urology;  Laterality: Right;   CYSTOSCOPY/URETEROSCOPY/HOLMIUM LASER/STENT PLACEMENT Right 12/22/2019   Procedure: CYSTOSCOPY RIGHT URETEROSCOPY/HOLMIUM LASER/STENT PLACEMENT;  Surgeon: Ardis Hughs, MD;  Location: [facility];  Service: Urology;  Laterality: Right;   ESOPHAGOGASTRODUODENOSCOPY (EGD) WITH ESOPHAGEAL DILATION  11/2007   ESOPHAGOGASTRODUODENOSCOPY (EGD) WITH PROPOFOL N/A 09/28/2015   Procedure: ESOPHAGOGASTRODUODENOSCOPY (EGD) WITH PROPOFOL;  Surgeon: Clarene Essex, MD;  Location: [facility];  Service: Endoscopy;  Laterality: N/A;   ESOPHAGOGASTRODUODENOSCOPY (EGD) WITH PROPOFOL N/A 09/09/2017   Procedure: ESOPHAGOGASTRODUODENOSCOPY (EGD) WITH PROPOFOL;  Surgeon: Clarene Essex, MD;  Location: [facility];  Service: Endoscopy;  Laterality: N/A;   ESOPHAGOGASTRODUODENOSCOPY (EGD) WITH PROPOFOL N/A 03/09/2020   Procedure: ESOPHAGOGASTRODUODENOSCOPY (EGD) WITH PROPOFOL WITH DIL;  Surgeon: Clarene Essex, MD;  Location: [facility];  Service: Endoscopy;  Laterality: N/A;   EYE SURGERY Bilateral    JOINT REPLACEMENT     LEFT HEART CATH AND CORONARY ANGIOGRAPHY N/A 08/10/2018   Procedure: LEFT HEART CATH AND CORONARY ANGIOGRAPHY;  Surgeon: Troy Sine, MD;  Location: [facility];  Service: Cardiovascular;  Laterality: N/A;   LEFT HEART CATHETERIZATION WITH CORONARY ANGIOGRAM N/A 09/03/2012   Procedure: LEFT HEART CATHETERIZATION WITH CORONARY ANGIOGRAM;  Surgeon: Sinclair Grooms, MD;  Location: [facility];  Service: Cardiovascular;  Laterality: N/A;   LEFT HEART CATHETERIZATION WITH CORONARY ANGIOGRAM N/A 12/01/2013   Procedure: LEFT HEART CATHETERIZATION WITH CORONARY ANGIOGRAM;  Surgeon: Sinclair Grooms, MD;  Location: [facility] CATH LAB;  Service: Cardiovascular;  Laterality: N/A;    LOWER EXTREMITY ANGIOGRAM Left 05/2005   L pop-pop BPG patent   NAILBED REPAIR Right 09/17/2016   Procedure: ABLATION NAILBED RIGHT INDEX;  Surgeon: Daryll Brod, MD;  Location: [place];  Service: Orthopedics;  Laterality: Right;   NASAL SEPTUM SURGERY  1977   PENILE PROSTHESIS IMPLANT  1990's   SAVORY DILATION N/A 09/28/2015   Procedure: SAVORY DILATION;  Surgeon: Clarene Essex, MD;  Location: [facility];  Service: Endoscopy;  Laterality: N/A;  have balloons available   SAVORY DILATION N/A 09/09/2017   Procedure: SAVORY DILATION;  Surgeon: Clarene Essex, MD;  Location: [facility];  Service: Endoscopy;  Laterality: N/A;   SAVORY DILATION N/A 03/09/2020   Procedure: SAVORY DILATION;  Surgeon: Clarene Essex, MD;  Location: [facility];  Service: Endoscopy;  Laterality: N/A;   SCROTAL SURGERY  08/1999   pump revision/notes 07/11/2010   SKIN FULL THICKNESS GRAFT Right 09/17/2016   Procedure: SKIN GRAFT FROM UPPER ARM;  Surgeon: Daryll Brod, MD;  Location: [place];  Service: Orthopedics;  Laterality: Right;   TOTAL KNEE ARTHROPLASTY Left 01/2009     Social History:   reports that he has been smoking cigarettes. He has a 14.75 pack-year smoking history. He has quit using smokeless tobacco.  His smokeless tobacco use included chew. He reports that he does not drink alcohol and does not use drugs.   Family History:  His family history includes Alzheimer's disease (age of onset: 23) in his mother; Diabetes in his daughter; Diabetes (age of onset: 35) in his father; Fibromyalgia (age of onset: 22) in his sister; Hypertension in his son; Obesity in his daughter; Peripheral vascular disease in his father; Pneumonia in his father.   Allergies Allergies  Allergen Reactions   Doxycycline Other (See Comments)    Dizziness   Flomax [Tamsulosin] Other (See Comments)    Dizziness    Lipitor [Atorvastatin] Other (See Comments)    Muscle weakness   Rhubarb Other (See Comments)     From childhood- exact reaction not recalled, but is allergic   Sulfa Antibiotics Hives   Sulfacetamide Sodium Hives   Clindamycin/Lincomycin Rash   Other Hives, Itching, Rash and Other (See Comments)    UNNAMED ANTIBIOTIC- SEVERE SYMPTOMS  [place]- From childhood- exact reaction not recalled, but is allergic   Vytorin [Ezetimibe-Simvastatin] Other (See Comments)  Bodyaches     Home Medications  Prior to Admission medications   Medication Sig Start Date End Date Taking? Authorizing Provider  aspirin EC 81 MG tablet Take 1 tablet (81 mg total) by mouth daily. Resume after 4 days. Patient taking differently: Take 81 mg by mouth daily. 06/17/15   Bonnielee Haff, MD  buPROPion (WELLBUTRIN) 100 MG tablet Take 200 mg by mouth at bedtime.     [provider]  Cholecalciferol (VITAMIN D3) 50 MCG (2000 UT) TABS Take 2,000 Units by mouth in the morning.    [provider]  docusate sodium (COLACE) 100 MG capsule Take 100 mg by mouth 2 (two) times daily as needed for mild constipation.    [provider]  finasteride (PROSCAR) 5 MG tablet Take 5 mg by mouth at bedtime.    [provider]  HYDROcodone-acetaminophen (NORCO/VICODIN) 5-325 MG tablet Take 1 tablet by mouth every 6 (six) hours as needed for moderate pain. 02/03/20   [provider]  isosorbide mononitrate (IMDUR) 60 MG 24 hr tablet Take 1.5 tablets (90 mg total) by mouth daily. Please keep upcoming appt in June 2022 with Cardiologist before anymore refills. Thank you Patient taking differently: Take 90 mg by mouth daily. 06/14/20   Belva Crome, MD  metoprolol tartrate (LOPRESSOR) 25 MG tablet Take 0.5 tablets (12.5 mg total) by mouth 2 (two) times daily. 10/18/19   Belva Crome, MD  Multiple Vitamin (MULTIVITAMIN WITH MINERALS) TABS tablet Take 1 tablet by mouth daily.    [provider]  nitroGLYCERIN (NITROSTAT) 0.4 MG SL tablet Place 0.4 mg under the tongue See admin  instructions. Dissolve 0.4 mg sublingually 10 minutes before each meal for esophageal spasms and as needed for chest pain    [provider]  ondansetron (ZOFRAN) 4 MG tablet Take 4 mg by mouth every 8 (eight) hours as needed for nausea or vomiting.    [provider]  pantoprazole (PROTONIX) 40 MG tablet Take 40 mg by mouth 2 (two) times daily with a meal.    [provider]  polyethylene glycol powder (GLYCOLAX/MIRALAX) 17 GM/SCOOP powder Take 17 g by mouth daily as needed for mild constipation (MIX AND DRINK).    [provider]  ranolazine (RANEXA) 1000 MG SR tablet Take 1 tablet (1,000 mg total) by mouth 2 (two) times daily. 06/27/20   Belva Crome, MD  rosuvastatin (CRESTOR) 10 MG tablet Take 10 mg by mouth daily.    [provider]  traMADol (ULTRAM) 50 MG tablet Take 1 tablet (50 mg total) by mouth every 6 (six) hours as needed. Patient taking differently: Take 50 mg by mouth every 6 (six) hours as needed for moderate pain. 06/01/19   Daryll Brod, MD  vitamin B-12 (CYANOCOBALAMIN) 1000 MCG tablet Take 1,000 mcg by mouth daily.    [provider]     Critical care time: 36

## 2020-08-05 NOTE — Progress Notes (Signed)
eLink Physician-Brief Progress Note Patient Name: [name] [name] DOB: 03-19-1940 MRN: [id number]   Date of Service  08/05/2020  HPI/Events of Note  Patient admitted with a history of several days of upper GI tract symptoms followed by hypotension and lactemia concerning for evolving septic shock, CT  abdomen suggestive of esophagitis.  eICU Interventions  New Patient Evaluation completed.        Kerry Kass Chinwe Lope 08/05/2020, 2:59 AM

## 2020-08-05 NOTE — Progress Notes (Signed)
  PCCM:  Patient initially brought to the intensive care unit was hypotensive on low-dose peripheral Levophed.  After being transferred we held his antihypertensives.  He was started on antibiotics for possible community-acquired pneumonia.  He has achalasia, I wonder if he had an aspiration event at home.  Otherwise patient is on room air advancing diet as tolerated.  I think he can move from the intensive care unit.  [name], DO [facility] 08/05/2020 7:52 AM

## 2020-08-05 NOTE — Progress Notes (Signed)
eLink Physician-Brief Progress Note Patient Name: BRAELEN SPROULE DOB: 1940-11-30 MRN: [id number]   Date of Service  08/05/2020  HPI/Events of Note  Insomnia.  eICU Interventions  Ordered melatonin QHS.     Intervention Category Minor Interventions: Routine modifications to care plan (e.g. PRN medications for pain, fever)  Marily Lente Carrell [place] 08/05/2020, 10:26 PM

## 2020-08-06 ENCOUNTER — Inpatient Hospital Stay (HOSPITAL_COMMUNITY): Payer: Medicare Other

## 2020-08-06 DIAGNOSIS — I9589 Other hypotension: Secondary | ICD-10-CM

## 2020-08-06 LAB — BASIC METABOLIC PANEL
Anion gap: 7 (ref 5–15)
Anion gap: 7 (ref 5–15)
BUN: 47 mg/dL — ABNORMAL HIGH (ref 8–23)
BUN: 43 mg/dL — ABNORMAL HIGH (ref 8–23)
CO2: 24 mmol/L (ref 22–32)
CO2: 28 mmol/L (ref 22–32)
Calcium: 8.6 mg/dL — ABNORMAL LOW (ref 8.9–10.3)
Calcium: 8.8 mg/dL — ABNORMAL LOW (ref 8.9–10.3)
Chloride: 103 mmol/L (ref 98–111)
Chloride: 101 mmol/L (ref 98–111)
Creatinine, Ser: 2.18 mg/dL — ABNORMAL HIGH (ref 0.61–1.24)
Creatinine, Ser: 2.01 mg/dL — ABNORMAL HIGH (ref 0.61–1.24)
GFR, Estimated: 30 mL/min — ABNORMAL LOW (ref >60)
GFR, Estimated: 33 mL/min — ABNORMAL LOW (ref >60)
Glucose, Bld: 109 mg/dL — ABNORMAL HIGH (ref 70–99)
Glucose, Bld: 109 mg/dL — ABNORMAL HIGH (ref 70–99)
Potassium: 3.6 mmol/L (ref 3.5–5.1)
Potassium: 4.1 mmol/L (ref 3.5–5.1)
Sodium: 134 mmol/L — ABNORMAL LOW (ref 135–145)
Sodium: 136 mmol/L (ref 135–145)

## 2020-08-06 LAB — ECHOCARDIOGRAM COMPLETE
AR max vel: 1.42 cm2
AV Area VTI: 1.33 cm2
AV Area mean vel: 1.57 cm2
AV Mean grad: 7 mmHg
AV Peak grad: 13.2 mmHg
Ao pk vel: 1.82 m/s
Area-P 1/2: 3.37 cm2
Height: 69 in
MV VTI: 1.64 cm2
S' Lateral: 4 cm
Weight: 2927.71 [oz_av]

## 2020-08-06 LAB — URINE CULTURE: Culture: NO GROWTH

## 2020-08-06 LAB — CBC
HCT: 32.6 % — ABNORMAL LOW (ref 39.0–52.0)
Hemoglobin: 10.8 g/dL — ABNORMAL LOW (ref 13.0–17.0)
MCH: 32.7 pg (ref 26.0–34.0)
MCHC: 33.1 g/dL (ref 30.0–36.0)
MCV: 98.8 fL (ref 80.0–100.0)
Platelets: 93 10*3/uL — ABNORMAL LOW (ref 150–400)
RBC: 3.3 MIL/uL — ABNORMAL LOW (ref 4.22–5.81)
RDW: 13.1 % (ref 11.5–15.5)
WBC: 11.2 10*3/uL — ABNORMAL HIGH (ref 4.0–10.5)
nRBC: 0 % (ref 0.0–0.2)

## 2020-08-06 LAB — CREATININE, URINE, RANDOM: Creatinine, Urine: 47.34 mg/dL

## 2020-08-06 LAB — SODIUM, URINE, RANDOM: Sodium, Ur: 64 mmol/L

## 2020-08-06 MED ORDER — POTASSIUM CHLORIDE CRYS ER 20 MEQ PO TBCR
40.0000 meq | EXTENDED_RELEASE_TABLET | Freq: Once | ORAL | Status: AC
  Administered 2020-08-06: 40 meq via ORAL
  Filled 2020-08-06: qty 2

## 2020-08-06 MED ORDER — PANTOPRAZOLE SODIUM 40 MG PO TBEC
40.0000 mg | DELAYED_RELEASE_TABLET | Freq: Every day | ORAL | Status: DC
  Administered 2020-08-06 – 2020-08-09 (×4): 40 mg via ORAL
  Filled 2020-08-06 (×4): qty 1

## 2020-08-06 MED ORDER — GUAIFENESIN ER 600 MG PO TB12
600.0000 mg | ORAL_TABLET | Freq: Two times a day (BID) | ORAL | Status: DC
  Administered 2020-08-06 (×2): 600 mg via ORAL
  Filled 2020-08-06 (×2): qty 1

## 2020-08-06 MED ORDER — SODIUM CHLORIDE 0.9 % IV SOLN: INTRAVENOUS | Status: DC

## 2020-08-06 MED ORDER — CEFDINIR 300 MG PO CAPS
300.0000 mg | ORAL_CAPSULE | Freq: Two times a day (BID) | ORAL | Status: DC
  Administered 2020-08-06 – 2020-08-09 (×7): 300 mg via ORAL
  Filled 2020-08-06 (×10): qty 1

## 2020-08-06 MED ORDER — PERFLUTREN LIPID MICROSPHERE
1.0000 mL | INTRAVENOUS | Status: AC | PRN
  Administered 2020-08-06: 6 mL via INTRAVENOUS
  Filled 2020-08-06 (×2): qty 10

## 2020-08-06 NOTE — Progress Notes (Addendum)
PROGRESS NOTE    [name] [name]  SJG:[id number] DOB: 1940/11/29 DOA: 08/05/2020 PCP: [name], MD   Chief Complain: Hypotension  Brief Narrative: Patient is a 80 year old male with history of coronary artery disease, COPD, degenerative disease, OSA, anxiety, BPH who presented to [facility] ER for shortness of breath, cough with whitish sputum, fatigue, vomiting, confusion.  He was recently admitted here from 6/5-6/6 for work-up of syncope showed, work-up was benign and was discharged.  As per the daughter, patient has had some chills along with cough.  On presentation at [facility] ER, he was hypotensive so he was started on IV fluids but was held because he developed right lower lobe crackles, elevated JVD.  Lactic acid was 2.9 on presentation.  He had mild grade fever.  Patient was started on IV antibiotics for possible community-acquired pneumonia.  He was started on Levophed for hypotension and was transferred to [facility] under [facility] service.  Patient transferred to [facility] on 08/06/2020.  On presentation, level also showed AKI with creatinine of 2.7, creatinine was normal on 6/5.  Currently he is hemodynamically stable.  Patient continues to complain of weakness.  Planning for PT/OT evaluation and continuing IV fluids for AKI.  Assessment & Plan:   Active Problems:   Hypotension  Hypotension: Unclear etiology but suspected to be from sepsis versus dehydration.  Patient was also on antihypertensives at home.  On presentation at [facility] ER, he had to be started on Levophed for maintenance of blood pressure and was transferred to ICU here under PCCM service.  Blood pressure has been stable now.  Home medications still on hold.  On Imdur and metoprolol at home  AKI: Baseline creatinine normal.  Creatinine in the range of 2.7 on presentation.  Most likely secondary to dehydration.  Will check urine sodium and creatinine.  Kidney function has responded to IV fluids.  Looks dehydrated.  Continue IV  fluids.  Check BMP tomorrow  Sepsis/community-acquired pneumonia: Presented with low-grade fever, cough with a sputum.  Chest x-ray done after he presented denies any pneumonia but he was suspected to have community-acquired pneumonia at [facility] and was started on IV antibiotics: Azithromycin, ceftriaxone.  Will change antibiotics to oral.  Patient states he has chronic cough most likely from COPD.  He is still a smoker and smokes a few cigarettes a day.  History of COPD: Currently not in exacerbation.  Continue home regimen on discharge.  He is a current smoker  GERD/achalasia: On Protonix.  He follows with gastroenterology as an outpatient.  Status post dilation on 03/09/2020.  Previous EGD on 1/22 showed benign-appearing esophageal stenosis versus motility disorder  in the distal esophagus.  Speech therapy evaluation done who recommended GI consultation for esophageal dysphagia.  [facility] consulted today, discussed with Dr. Cristina Gong  Hyperlipidemia: On Crestor 10 mg daily  BPH: On Proscar at home  Depression/anxiety: On Wellbutrin  Generalized weakness: We will request for PT/OT evaluation.              DVT prophylaxis:Heparin [place] Code Status: Full Family Communication: None at bedside Status is: Inpatient  Remains inpatient appropriate because:Unsafe d/c plan  Dispo: The patient is from: Home              Anticipated d/c is to: Home              Patient currently is not medically stable to d/c.   Difficult to place patient No     Consultants: PCCM  Procedures:None  Antimicrobials:  Anti-infectives (From admission, onward)    Start     Dose/Rate Route Frequency Ordered Stop   08/05/20 0500  cefTRIAXone (ROCEPHIN) 2 g in sodium chloride 0.9 % 100 mL IVPB        2 g 200 mL/hr over 30 Minutes Intravenous Daily 08/05/20 0406     08/05/20 0500  azithromycin (ZITHROMAX) 500 mg in sodium chloride 0.9 % 250 mL IVPB  Status:  Discontinued        500 mg 250 mL/hr  over 60 Minutes Intravenous Daily 08/05/20 0406 08/05/20 1704       Subjective:  Patient seen and examined the bedside this morning.  Hemodynamically stable during my evaluation.  Denies any complaints except for some cough, generalized weakness.  Blood pressure is better today.   Objective: Vitals:   08/06/20 0600 08/06/20 0700 08/06/20 0740 08/06/20 0800  BP: 134/68 134/88  128/74  Pulse: 62 70  69  Resp: 19 19  (!) 23  Temp:   98.2 F (36.8 C)   TempSrc:   Oral   SpO2: 97% 98%  99%  Weight:      Height:        Intake/Output Summary (Last 24 hours) at 08/06/2020 0939 Last data filed at 08/06/2020 0600 Gross per 24 hour  Intake 209.7 ml  Output 750 ml  Net -540.3 ml   Filed Weights   08/05/20 0236 08/06/20 0500  Weight: 83.3 kg 83 kg    Examination:  General exam: Overall comfortable, pleasant elderly male HEENT:PERRL,Oral mucosa dry, Ear/Nose normal on gross exam Respiratory system: Bilateral  diminished air sounds, mild rhonchi  cardiovascular system: S1 & S2 heard, RRR. No JVD, murmurs, rubs, gallops or clicks. No pedal edema. Gastrointestinal system: Abdomen is nondistended, soft and nontender. No organomegaly or masses felt. Normal bowel sounds heard. Central nervous system: Alert and oriented. No focal neurological deficits. Extremities: No edema, no clubbing ,no cyanosis Skin: No rashes, lesions or ulcers,no icterus ,no pallor   Data Reviewed: I have personally reviewed following labs and imaging studies  CBC: Recent Labs  Lab 07/30/20 1301 07/30/20 2039 08/05/20 0441 08/06/20 0116  WBC 5.9 5.7 11.6* 11.2*  NEUTROABS  --   --  10.0*  --   HGB 12.0* 12.5* 11.5* 10.8*  HCT 36.6* 38.2* 34.4* 32.6*  MCV 101.1* 100.3* 98.3 98.8  PLT 171 172 PLATELET CLUMPS NOTED ON SMEAR, UNABLE TO ESTIMATE 93*   Basic Metabolic Panel: Recent Labs  Lab 07/30/20 1301 07/30/20 2039 08/05/20 0441 08/06/20 0116  NA 137  --  136 134*  K 4.2  --  4.5 3.6  CL 102   --  102 103  CO2 28  --  24 24  GLUCOSE 110*  --  113* 109*  BUN 17  --  51* 47*  CREATININE 0.88 0.86 2.71* 2.18*  CALCIUM 9.2  --  8.8* 8.6*  MG  --   --  2.1  --    GFR: Estimated Creatinine Clearance: 27 mL/min (A) (by C-G formula based on SCr of 2.18 mg/dL (H)). Liver Function Tests: Recent Labs  Lab 07/30/20 2039 08/05/20 0441  AST 16 20  ALT 15 17  ALKPHOS 62 46  BILITOT 0.6 0.8  PROT 6.5 5.7*  ALBUMIN 3.1* 2.4*   Recent Labs  Lab 07/30/20 2039  LIPASE 21   No results for input(s): AMMONIA in the last 168 hours. Coagulation Profile: No results for input(s): INR, PROTIME in the last 168 hours. Cardiac  Enzymes: No results for input(s): CKTOTAL, CKMB, CKMBINDEX, TROPONINI in the last 168 hours. BNP (last 3 results) No results for input(s): PROBNP in the last 8760 hours. HbA1C: No results for input(s): HGBA1C in the last 72 hours. CBG: No results for input(s): GLUCAP in the last 168 hours. Lipid Profile: No results for input(s): CHOL, HDL, LDLCALC, TRIG, CHOLHDL, LDLDIRECT in the last 72 hours. Thyroid Function Tests: No results for input(s): TSH, T4TOTAL, FREET4, T3FREE, THYROIDAB in the last 72 hours. Anemia Panel: No results for input(s): VITAMINB12, FOLATE, FERRITIN, TIBC, IRON, RETICCTPCT in the last 72 hours. Sepsis Labs: Recent Labs  Lab 08/05/20 0441  PROCALCITON 13.45  LATICACIDVEN 2.0*    Recent Results (from the past 240 hour(s))  SARS CORONAVIRUS 2 (TAT 6-24 HRS) Nasopharyngeal Nasopharyngeal Swab     Status: None   Collection Time: 07/30/20  2:45 PM   Specimen: Nasopharyngeal Swab  Result Value Ref Range Status   SARS Coronavirus 2 NEGATIVE NEGATIVE Final    Comment: (NOTE) SARS-CoV-2 target nucleic acids are NOT DETECTED.  The SARS-CoV-2 RNA is generally detectable in upper and lower respiratory specimens during the acute phase of infection. Negative results do not preclude SARS-CoV-2 infection, do not rule out co-infections with other  pathogens, and should not be used as the sole basis for treatment or other patient management decisions. Negative results must be combined with clinical observations, patient history, and epidemiological information. The expected result is Negative.  Fact Sheet for Patients: SugarRoll.be  Fact Sheet for Healthcare Providers: https://www.woods-mathews.com/  This test is not yet approved or cleared by the [place] FDA and  has been authorized for detection and/or diagnosis of SARS-CoV-2 by FDA under an Emergency Use Authorization (EUA). This EUA will remain  in effect (meaning this test can be used) for the duration of the COVID-19 declaration under Se ction 564(b)(1) of the Act, 21 U.S.C. section 360bbb-3(b)(1), unless the authorization is terminated or revoked sooner.  Performed at [facility], [place] [address], [place], [place] [address]   Culture, blood (routine x 2)     Status: None (Preliminary result)   Collection Time: 08/05/20  4:30 AM   Specimen: BLOOD LEFT HAND  Result Value Ref Range Status   Specimen Description   Final    BLOOD LEFT HAND Performed at [facility], [address] [place], [place] [address]    Special Requests   Final    BOTTLES DRAWN AEROBIC AND ANAEROBIC Blood Culture adequate volume Performed at [facility], [address] [place], [place] [address]    Culture   Final    NO GROWTH 1 DAY Performed at [facility], [place] [address] [place], [place] [address]    Report Status PENDING  Incomplete  Culture, blood (routine x 2)     Status: None (Preliminary result)   Collection Time: 08/05/20  4:50 AM   Specimen: BLOOD  Result Value Ref Range Status   Specimen Description   Final    BLOOD RIGHT ANTECUBITAL Performed at [facility], [address] [place], [place] [address]    Special Requests   Final    BOTTLES DRAWN AEROBIC AND ANAEROBIC Blood Culture  adequate volume Performed at [facility], [address] [place], [place] [address]    Culture   Final    NO GROWTH 1 DAY Performed at [facility], [place] [address], [place], [place] [address]  Report Status PENDING  Incomplete  MRSA Next Gen by PCR, Nasal     Status: None   Collection Time: 08/05/20  4:56 AM  Result Value Ref Range Status   MRSA by PCR Next Gen NOT DETECTED NOT DETECTED Final    Comment: (NOTE) The GeneXpert MRSA Assay (FDA approved for NASAL specimens only), is one component of a comprehensive MRSA colonization surveillance program. It is not intended to diagnose MRSA infection nor to guide or monitor treatment for MRSA infections. Test performance is not FDA approved in patients less than 46 years old. Performed at [facility], [place] [address] [place], [place] [address]          Radiology Studies: DG Chest Port 1 View  Result Date: 08/05/2020 CLINICAL DATA:  Short of breath EXAM: PORTABLE CHEST 1 VIEW COMPARISON:  CT scan of the chest and chest x-ray 07/30/2020 FINDINGS: Cardiac and mediastinal contours are within normal limits. Atherosclerotic calcifications present along the transverse aorta. Stable appearance of the lungs with mild bronchitic changes. No focal airspace infiltrate, pleural effusion, pneumothorax or other acute process. No acute fracture or malalignment. IMPRESSION: Stable chest x-ray without evidence of acute cardiopulmonary process. Electronically Signed   By: Jacqulynn Cadet M.D.   On: 08/05/2020 10:06        Scheduled Meds:  Chlorhexidine Gluconate Cloth  6 each Topical Daily   heparin  5,000 Units Subcutaneous Q8H   melatonin  3 mg Oral QHS   nitroGLYCERIN  0.4 mg Sublingual TID AC   pantoprazole (PROTONIX) IV  40 mg Intravenous QHS   Continuous Infusions:  sodium chloride 10 mL/hr at 08/06/20 0600   sodium chloride     cefTRIAXone (ROCEPHIN)  IV 2 g (08/06/20 0608)     LOS: 1 day    Time  spent: 35 mins.More than 50% of that time was spent in counseling and/or coordination of care.      Shelly Coss, MD [facility] P6/01/2021, 9:39 AM

## 2020-08-06 NOTE — Evaluation (Signed)
Clinical/Bedside Swallow Evaluation Patient Details  Name: [name] [name] MRN: [id number] Date of Birth: Mar 18, 1940  Today's Date: 08/06/2020 Time: SLP Start Time (ACUTE ONLY): 1203 SLP Stop Time (ACUTE ONLY): 4008 SLP Time Calculation (min) (ACUTE ONLY): 19 min  Past Medical History:  Past Medical History:  Diagnosis Date   Adenomatous polyp 2007   Anginal pain ([place])    Chronic    Anxiety    Arthritis    "my body"   B12 deficiency    BPH (benign prostatic hyperplasia)    Chronic back pain    "all over"   Chronic low back pain    COPD (chronic obstructive pulmonary disease) (HCC)    Coronary artery disease 2009   DDD (degenerative disc disease), cervical    DDD (degenerative disc disease), lumbosacral    DDD (degenerative disc disease), thoracic    Depression    Gastroesophageal reflux    Gynecomastia 06/2009   History of kidney stones    Hyperglycemia    Hyperlipidemia LDL goal < 70    MI (myocardial infarction) ([place]) 08/1989; 1993   OSA on CPAP    "have mask; don't wear it" (02/02/2014) 09/25/15- now has a BiPAp- doesn'tt use it   Peptic ulcer disease 1959   Pre-diabetes    Right bundle branch block    Tobacco abuse    Vertebral compression fracture ([place]) 1970   Past Surgical History:  Past Surgical History:  Procedure Laterality Date   BYPASS GRAFT POPLITEAL TO POPLITEAL Left 06/2005   Dr Donnetta Hutching   BYPASS GRAFT POPLITEAL TO POPLITEAL Right 02/2001   Dr Deon Pilling   CARDIAC CATHETERIZATION  1991; 1993; 08/2012; 11/2013; 02/02/2014   I've had 6-7 total   CARDIAC CATHETERIZATION  02/02/2014   Procedure: LEFT HEART CATH AND CORONARY ANGIOGRAPHY;  Surgeon: Jettie Booze, MD;  mild disease in LAD/CFX, RCA w/ CTO, unsuccessfull PCI both w/ antegrade and retrograde approaches    CARPAL TUNNEL RELEASE Left 06/01/2019   Procedure: LEFT CARPAL TUNNEL RELEASE;  Surgeon: Daryll Brod, MD;  Location: [place];  Service: Orthopedics;  Laterality: Left;  FOREARM BLOCK    CATARACT EXTRACTION W/ INTRAOCULAR LENS  IMPLANT, BILATERAL Bilateral ~ 2012   COLONOSCOPY W/ POLYPECTOMY     CORONARY ANGIOPLASTY  1990's   CYSTOSCOPY WITH STENT PLACEMENT Right 11/19/2019   Procedure: CYSTOSCOPY WITH RIGHT URETERAL STENT PLACEMENT;  Surgeon: Lucas Mallow, MD;  Location: [place];  Service: Urology;  Laterality: Right;   CYSTOSCOPY/URETEROSCOPY/HOLMIUM LASER/STENT PLACEMENT Right 12/22/2019   Procedure: CYSTOSCOPY RIGHT URETEROSCOPY/HOLMIUM LASER/STENT PLACEMENT;  Surgeon: Ardis Hughs, MD;  Location: [facility];  Service: Urology;  Laterality: Right;   ESOPHAGOGASTRODUODENOSCOPY (EGD) WITH ESOPHAGEAL DILATION  11/2007   ESOPHAGOGASTRODUODENOSCOPY (EGD) WITH PROPOFOL N/A 09/28/2015   Procedure: ESOPHAGOGASTRODUODENOSCOPY (EGD) WITH PROPOFOL;  Surgeon: Clarene Essex, MD;  Location: [facility] END[facility];  Service: Endoscopy;  Laterality: N/A;   ESOPHAGOGASTRODUODENOSCOPY (EGD) WITH PROPOFOL N/A 09/09/2017   Procedure: ESOPHAGOGASTRODUODENOSCOPY (EGD) WITH PROPOFOL;  Surgeon: Clarene Essex, MD;  Location: [facility];  Service: Endoscopy;  Laterality: N/A;   ESOPHAGOGASTRODUODENOSCOPY (EGD) WITH PROPOFOL N/A 03/09/2020   Procedure: ESOPHAGOGASTRODUODENOSCOPY (EGD) WITH PROPOFOL WITH DIL;  Surgeon: Clarene Essex, MD;  Location: [facility];  Service: Endoscopy;  Laterality: N/A;   EYE SURGERY Bilateral    JOINT REPLACEMENT     LEFT HEART CATH AND CORONARY ANGIOGRAPHY N/A 08/10/2018   Procedure: LEFT HEART CATH AND CORONARY ANGIOGRAPHY;  Surgeon: Troy Sine, MD;  Location: [place];  Service: Cardiovascular;  Laterality: N/A;   LEFT HEART CATHETERIZATION WITH CORONARY ANGIOGRAM N/A 09/03/2012   Procedure: LEFT HEART CATHETERIZATION WITH CORONARY ANGIOGRAM;  Surgeon: Sinclair Grooms, MD;  Location: [facility] C[facility] LAB;  Service: Cardiovascular;  Laterality: N/A;   LEFT HEART CATHETERIZATION WITH CORONARY ANGIOGRAM N/A 12/01/2013   Procedure: LEFT HEART CATHETERIZATION WITH CORONARY  ANGIOGRAM;  Surgeon: Sinclair Grooms, MD;  Location: [facility] CATH LAB;  Service: Cardiovascular;  Laterality: N/A;   LOWER EXTREMITY ANGIOGRAM Left 05/2005   L pop-pop BPG patent   NAILBED REPAIR Right 09/17/2016   Procedure: ABLATION NAILBED RIGHT INDEX;  Surgeon: Daryll Brod, MD;  Location: [place];  Service: Orthopedics;  Laterality: Right;   NASAL SEPTUM SURGERY  1977   PENILE PROSTHESIS IMPLANT  1990's   SAVORY DILATION N/A 09/28/2015   Procedure: SAVORY DILATION;  Surgeon: Clarene Essex, MD;  Location: [facility] ENDOSCOPY;  Service: Endoscopy;  Laterality: N/A;  have balloons available   SAVORY DILATION N/A 09/09/2017   Procedure: SAVORY DILATION;  Surgeon: Clarene Essex, MD;  Location: [facility];  Service: Endoscopy;  Laterality: N/A;   SAVORY DILATION N/A 03/09/2020   Procedure: SAVORY DILATION;  Surgeon: Clarene Essex, MD;  Location: [facility];  Service: Endoscopy;  Laterality: N/A;   SCROTAL SURGERY  08/1999   pump revision/notes 07/11/2010   SKIN FULL THICKNESS GRAFT Right 09/17/2016   Procedure: SKIN GRAFT FROM UPPER ARM;  Surgeon: Daryll Brod, MD;  Location: [place];  Service: Orthopedics;  Laterality: Right;   TOTAL KNEE ARTHROPLASTY Left 01/2009   HPI:  Patient is a 80 year old male with history of coronary artery disease, COPD, degenerative disease, OSA, anxiety, BPH who presented to [facility] ER for shortness of breath, cough with whitish sputum, fatigue, vomiting, confusion. Dx with PNA. He follows with gastroenterology as an outpatient.  Status post dilation on 03/09/2020.  Previous EGD on 1/22 showed benign-appearing esophageal stenosis versus moderate discharge in the distal esophagus.   Assessment / Plan / Recommendation Clinical Impression  Bedside swallow evaluation complete. Patient does present with what appears to be a primary esophageal dysphagia with intact oropharyngeal swallowing function and no signs of aspiration but with complaints of globus, mid  chest pressure, and reports of regurgitation post swallow which does increase aspiration risk and may have contributed to acute PNA. Patient independently using swallowing strategies taught by primary GI MD. Note potential for GI consult in MD notes from this am. Agree with GI consult while patient here if possible. No SLP f/u indicated. SLP Visit Diagnosis: Dysphagia, unspecified (R13.10)    Aspiration Risk       Diet Recommendation Regular;Thin liquid   Liquid Administration via: Cup;Straw Medication Administration: Whole meds with liquid (crush large pills if able) Supervision: Patient able to self feed Compensations: Slow rate;Small sips/bites;Follow solids with liquid Postural Changes: Seated upright at 90 degrees;Remain upright for at least 30 minutes after po intake    Other  Recommendations Recommended Consults: Consider GI evaluation Oral Care Recommendations: Oral care BID   Follow up Recommendations None        Swallow Study   General HPI: Patient is a 80 year old male with history of coronary artery disease, COPD, degenerative disease, OSA, anxiety, BPH who presented to [facility] ER for shortness of breath, cough with whitish sputum, fatigue, vomiting, confusion. Dx with PNA. He follows with gastroenterology as an outpatient.  Status post dilation on 03/09/2020.  Previous EGD on 1/22 showed benign-appearing esophageal stenosis versus  moderate discharge in the distal esophagus. Type of Study: Bedside Swallow Evaluation Previous Swallow Assessment: none Diet Prior to this Study: Regular;Thin liquids Temperature Spikes Noted: No Respiratory Status: Room air History of Recent Intubation: No Behavior/Cognition: Alert;Cooperative;Pleasant mood Oral Cavity Assessment: Within Functional Limits Oral Care Completed by SLP: No Oral Cavity - Dentition: Dentures, top Vision: Functional for self-feeding Self-Feeding Abilities: Able to feed self Patient Positioning: Upright in  bed Baseline Vocal Quality: Normal Volitional Cough: Strong Volitional Swallow: Able to elicit    Oral/Motor/Sensory Function Overall Oral Motor/Sensory Function: Within functional limits   Ice Chips Ice chips: Not tested   Thin Liquid Thin Liquid: Within functional limits Presentation: Cup;Self Fed;Straw    Nectar Thick Nectar Thick Liquid: Not tested   Honey Thick Honey Thick Liquid: Not tested   Puree Puree: Within functional limits Presentation: Spoon;Self Fed   Solid     Solid: Within functional limits Presentation: Self Fed     [employer] MA, CCC-SLP  Parris Signer Meryl 08/06/2020,12:28 PM

## 2020-08-06 NOTE — Consult Note (Signed)
Referring Provider: [facility] Primary Care Physician:  [name], MD Primary Gastroenterologist:  Dr. Clarene Essex  Reason for Consultation: Dysphagia, pneumonia  HPI: [name] [name] is a 80 y.o. male admitted to the hospital yesterday in transfer from [facility] ER because of shortness of breath and cough and confusion, associated with hypotension and presumed community-acquired pneumonia.  He was transiently on Levophed and was started on antibiotics.  Also had acute kidney injury.  Medical illnesses include coronary disease, COPD, and sleep apnea.  He was actually discharged from the hospital about 5 days earlier because of syncope, with negative work-up.  We are being consulted because of concerns of aspiration, due to dysphagia, as a contributing factor in his presumed pneumonia.    The patient has a history of known esophageal dysfunction, status post initial Savary dilatation to 16 mm by Dr. Watt Climes in October 2014, again in 2017 and 2019, and most recently a Savary dilatation to 16 mm in January of this year, at which time there was no obvious stenosis but questionable LES spasm.  Dr. Perley Jain overall impression is that this is more likely a motility disorder than stenosis accounting for his dysphagia symptoms.  Of note, the patient's daughter at the bedside indicates that the patient got only transient benefit from previous dilatations, and Dr. Watt Climes had indicated that the neck step would probably be a trial of Botox injections.  A barium swallow prior to his last dilatation (last January) showed an apparent short segment stricture, as well as tertiary contractions with barium stasis in the esophageal body, but as noted above, there was no sustained benefit to his dilatation procedure.  Incidentally, this patient has a history of multiple adenomatous polyps, and is overdue for surveillance.  He had 8 small adenomatous polyps removed on his most recent colonoscopy in October 2014,  at which time a 3-year follow-up was recommended, but which never occurred.  Presumably, the patient is felt to have aged out of the need for routine surveillance at this time, taking into account his medical comorbidities, since Dr. Watt Climes did not do colonoscopy concurrently with any of his 3 hospital upper endoscopies over the past 5 years and there is reference to this decision during an office visit back in 2017.  Since admission, the patient's mental status has cleared and he is currently breathing room air, saturating 100%.     Past Medical History:  Diagnosis Date   Adenomatous polyp 2007   Anginal pain ([place])    Chronic    Anxiety    Arthritis    "my body"   B12 deficiency    BPH (benign prostatic hyperplasia)    Chronic back pain    "all over"   Chronic low back pain    COPD (chronic obstructive pulmonary disease) (HCC)    Coronary artery disease 2009   DDD (degenerative disc disease), cervical    DDD (degenerative disc disease), lumbosacral    DDD (degenerative disc disease), thoracic    Depression    Gastroesophageal reflux    Gynecomastia 06/2009   History of kidney stones    Hyperglycemia    Hyperlipidemia LDL goal < 70    MI (myocardial infarction) ([place]) 08/1989; 1993   OSA on CPAP    "have mask; don't wear it" (02/02/2014) 09/25/15- now has a BiPAp- doesn'tt use it   Peptic ulcer disease 1959   Pre-diabetes    Right bundle branch block    Tobacco abuse    Vertebral compression fracture (  [place]) 1970    Past Surgical History:  Procedure Laterality Date   BYPASS GRAFT POPLITEAL TO POPLITEAL Left 06/2005   Dr Donnetta Hutching   BYPASS GRAFT POPLITEAL TO POPLITEAL Right 02/2001   Dr Deon Pilling   CARDIAC CATHETERIZATION  1991; 1993; 08/2012; 11/2013; 02/02/2014   I've had 6-7 total   CARDIAC CATHETERIZATION  02/02/2014   Procedure: LEFT HEART CATH AND CORONARY ANGIOGRAPHY;  Surgeon: Jettie Booze, MD;  mild disease in LAD/CFX, RCA w/ CTO, unsuccessfull PCI both w/ antegrade and  retrograde approaches    CARPAL TUNNEL RELEASE Left 06/01/2019   Procedure: LEFT CARPAL TUNNEL RELEASE;  Surgeon: Daryll Brod, MD;  Location: [place];  Service: Orthopedics;  Laterality: Left;  FOREARM BLOCK   CATARACT EXTRACTION W/ INTRAOCULAR LENS  IMPLANT, BILATERAL Bilateral ~ 2012   COLONOSCOPY W/ POLYPECTOMY     CORONARY ANGIOPLASTY  1990's   CYSTOSCOPY WITH STENT PLACEMENT Right 11/19/2019   Procedure: CYSTOSCOPY WITH RIGHT URETERAL STENT PLACEMENT;  Surgeon: Lucas Mallow, MD;  Location: [place];  Service: Urology;  Laterality: Right;   CYSTOSCOPY/URETEROSCOPY/HOLMIUM LASER/STENT PLACEMENT Right 12/22/2019   Procedure: CYSTOSCOPY RIGHT URETEROSCOPY/HOLMIUM LASER/STENT PLACEMENT;  Surgeon: Ardis Hughs, MD;  Location: [facility];  Service: Urology;  Laterality: Right;   ESOPHAGOGASTRODUODENOSCOPY (EGD) WITH ESOPHAGEAL DILATION  11/2007   ESOPHAGOGASTRODUODENOSCOPY (EGD) WITH PROPOFOL N/A 09/28/2015   Procedure: ESOPHAGOGASTRODUODENOSCOPY (EGD) WITH PROPOFOL;  Surgeon: Clarene Essex, MD;  Location[facility] ENDOSCOPY;  Service: Endoscopy;  Laterality: N/A;   ESOPHAGOGASTRODUODENOSCOPY (EGD) WITH PROPOFOL N/A 09/09/2017   Procedure: ESOPHAGOGASTRODUODENOSCOPY (EGD) WITH PROPOFOL;  Surgeon: Clarene Essex, MD;  Location: [facility];  Service: Endoscopy;  Laterality: N/A;   ESOPHAGOGASTRODUODENOSCOPY (EGD) WITH PROPOFOL N/A 03/09/2020   Procedure: ESOPHAGOGASTRODUODENOSCOPY (EGD) WITH PROPOFOL WITH DIL;  Surgeon: Clarene Essex, MD;  Location: [facility];  Service: Endoscopy;  Laterality: N/A;   EYE SURGERY Bilateral    JOINT REPLACEMENT     LEFT HEART CATH AND CORONARY ANGIOGRAPHY N/A 08/10/2018   Procedure: LEFT HEART CATH AND CORONARY ANGIOGRAPHY;  Surgeon: Troy Sine, MD;  Location: [place] [facility];  Service: Cardiovascular;  Laterality: N/A;   LEFT HEART CATHETERIZATION WITH CORONARY ANGIOGRAM N/A 09/03/2012   Procedure: LEFT HEART CATHETERIZATION WITH CORONARY ANGIOGRAM;   Surgeon: Sinclair Grooms, MD;  Location[facility] CATH LAB;  Service: Cardiovascular;  Laterality: N/A;   LEFT HEART CATHETERIZATION WITH CORONARY ANGIOGRAM N/A 12/01/2013   Procedure: LEFT HEART CATHETERIZATION WITH CORONARY ANGIOGRAM;  Surgeon: Sinclair Grooms, MD;  Location: [facility] CATH LAB;  Service: Cardiovascular;  Laterality: N/A;   LOWER EXTREMITY ANGIOGRAM Left 05/2005   L pop-pop BPG patent   NAILBED REPAIR Right 09/17/2016   Procedure: ABLATION NAILBED RIGHT INDEX;  Surgeon: Daryll Brod, MD;  Location: [place];  Service: Orthopedics;  Laterality: Right;   NASAL SEPTUM SURGERY  1977   PENILE PROSTHESIS IMPLANT  1990's   SAVORY DILATION N/A 09/28/2015   Procedure: SAVORY DILATION;  Surgeon: Clarene Essex, MD;  Location: [facility] ENDOSCOPY;  Service: Endoscopy;  Laterality: N/A;  have balloons available   SAVORY DILATION N/A 09/09/2017   Procedure: SAVORY DILATION;  Surgeon: Clarene Essex, MD;  Location: [facility];  Service: Endoscopy;  Laterality: N/A;   SAVORY DILATION N/A 03/09/2020   Procedure: SAVORY DILATION;  Surgeon: Clarene Essex, MD;  Location[facility];  Service: Endoscopy;  Laterality: N/A;   SCROTAL SURGERY  08/1999   pump revision/notes 07/11/2010   SKIN FULL THICKNESS GRAFT Right  09/17/2016   Procedure: SKIN GRAFT FROM UPPER ARM;  Surgeon: Daryll Brod, MD;  Location: [place];  Service: Orthopedics;  Laterality: Right;   TOTAL KNEE ARTHROPLASTY Left 01/2009    Prior to Admission medications   Medication Sig Start Date End Date Taking? Authorizing Provider  aspirin EC 81 MG tablet Take 1 tablet (81 mg total) by mouth daily. Resume after 4 days. Patient taking differently: Take 81 mg by mouth every morning. 06/17/15  Yes Bonnielee Haff, MD  buPROPion [facility]) 100 MG tablet Take 200 mg by mouth at bedtime.    Yes [provider]  Cholecalciferol (VITAMIN D3) 50 MCG (2000 UT) TABS Take 2,000 Units by mouth every morning.   Yes [provider]  donepezil (ARICEPT) 5 MG tablet Take 5 mg by mouth at bedtime.   Yes [provider]  finasteride (PROSCAR) 5 MG tablet Take 5 mg by mouth at bedtime.   Yes [provider]  HYDROcodone-acetaminophen (NORCO/VICODIN) 5-325 MG tablet Take 1 tablet by mouth every 6 (six) hours as needed for severe pain. 02/03/20  Yes [provider]  isosorbide mononitrate (IMDUR) 60 MG 24 hr tablet Take 1.5 tablets (90 mg total) by mouth daily. Please keep upcoming appt in June 2022 with Cardiologist before anymore refills. Thank you Patient taking differently: Take 90 mg by mouth at bedtime. 06/14/20  Yes Belva Crome, MD  metoprolol tartrate (LOPRESSOR) 25 MG tablet Take 0.5 tablets (12.5 mg total) by mouth 2 (two) times daily. 10/18/19  Yes Belva Crome, MD  nitroGLYCERIN (NITROSTAT) 0.4 MG SL tablet Place 0.4 mg under the tongue See admin instructions. Dissolve 0.4 mg sublingually 10 minutes before each meal for esophageal spasms and as needed for chest pain   Yes [provider]  ondansetron (ZOFRAN) 4 MG tablet Take 4 mg by mouth every 8 (eight) hours as needed for nausea or vomiting.   Yes [provider]  pantoprazole (PROTONIX) 40 MG tablet Take 40 mg by mouth 2 (two) times daily with a meal.   Yes [provider]  polyethylene glycol powder (GLYCOLAX/MIRALAX) 17 GM/SCOOP powder Take 17 g by mouth daily as needed for mild constipation (MIX AND DRINK).   Yes [provider]  ranolazine (RANEXA) 1000 MG SR tablet Take 1 tablet (1,000 mg total) by mouth 2 (two) times daily. 06/27/20  Yes Belva Crome, MD  rosuvastatin (CRESTOR) 10 MG tablet Take 10 mg by mouth every morning.   Yes [provider]  traMADol (ULTRAM) 50 MG tablet Take 1 tablet (50 mg total) by mouth every 6 (six) hours as needed. Patient taking differently: Take 50 mg by mouth daily as needed for severe pain. 06/01/19  Yes Daryll Brod, MD  vitamin B-12  (CYANOCOBALAMIN) 1000 MCG tablet Take 1,000 mcg by mouth every morning.   Yes [provider]  Multiple Vitamin (MULTIVITAMIN WITH MINERALS) TABS tablet Take 1 tablet by mouth daily.    [provider]    Current Facility-Administered Medications  Medication Dose Route Frequency Provider Last Rate Last Admin   0.9 %  sodium chloride infusion  250 mL Intravenous Continuous Jennelle Human B, NP   Stopped at 08/06/20 0749   0.9 %  sodium chloride infusion   Intravenous Continuous Shelly Coss, MD 75 mL/hr at 08/06/20 1200 Infusion Verify at 08/06/20 1200   cefdinir (OMNICEF) capsule 300 mg  300 mg Oral Q12H Shelly Coss, MD   300 mg at 08/06/20 1150  Chlorhexidine Gluconate Cloth 2 % PADS 6 each  6 each Topical Daily Icard, Bradley L, DO   6 each at 08/06/20 0833   guaiFENesin (MUCINEX) 12 hr tablet 600 mg  600 mg Oral BID Shelly Coss, MD   600 mg at 08/06/20 1149   heparin injection 5,000 Units  5,000 Units Subcutaneous Q8H Jennelle Human B, NP   5,000 Units at 08/06/20 1426   melatonin tablet 3 mg  3 mg Oral QHS Stretch, Marily Lente, MD   3 mg at 08/05/20 2247   nitroGLYCERIN (NITROSTAT) SL tablet 0.4 mg  0.4 mg Sublingual Q5 min PRN Donnamae Jude, RPH   0.4 mg at 08/06/20 1457   nitroGLYCERIN (NITROSTAT) SL tablet 0.4 mg  0.4 mg Sublingual TID AC Donnamae Jude, RPH   0.4 mg at 08/06/20 1143   pantoprazole (PROTONIX) EC tablet 40 mg  40 mg Oral Daily Shelly Coss, MD   40 mg at 08/06/20 1150    Allergies as of 08/04/2020 - Review Complete 07/30/2020  Allergen Reaction Noted   Doxycycline Other (See Comments) 05/13/2017   Flomax [tamsulosin] Other (See Comments) 11/19/2019   Lipitor [atorvastatin] Other (See Comments) 09/03/2012   Rhubarb Other (See Comments) 11/19/2019   Sulfa antibiotics Hives 09/03/2012   Sulfacetamide sodium Hives 06/16/2015   Clindamycin/lincomycin Rash 11/19/2019   Other Hives, Itching, Rash, and Other (See Comments) 01/12/2015   Vytorin  [ezetimibe-simvastatin] Other (See Comments) 09/03/2012    Family History  Problem Relation Age of Onset   Diabetes Father 48       deceased   Peripheral vascular disease Father    Pneumonia Father    Alzheimer's disease Mother 66       deceased   Fibromyalgia Sister 99       deceased   Hypertension Son    Obesity Daughter    Diabetes Daughter     Social History   Socioeconomic History   Marital status: Widowed    Spouse name: Not on file   Number of children: Not on file   Years of education: Not on file   Highest education level: Not on file  Occupational History   Not on file  Tobacco Use   Smoking status: Every Day    Packs/day: 0.25    Years: 59.00    Pack years: 14.75    Types: Cigarettes   Smokeless tobacco: Former    Types: Chew   Tobacco comments:    patient states quit around month ago(december 2021)  Vaping Use   Vaping Use: Never used  Substance and Sexual Activity   Alcohol use: No    Alcohol/week: 0.0 standard drinks   Drug use: No   Sexual activity: Not on file  Other Topics Concern   Not on file  Social History Narrative   Tobacco use cigarettes: former smoker, quit in year 2014. Pack-year Hx: 4, tobacco history last updated 05/12/2013, additional findings: tobacco user moderate cigarette smoker (10-19 cigs/day), additional findings: tobacco Non-User current non-smoker, no smoking. No alcohol, stopped 1981. 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 Marital status: married. 4 children.   Social Determinants of Health   Financial Resource Strain: Not on file  Food Insecurity: Not on file  Transportation Needs: Not on file  Physical Activity: Not on file  Stress: Not on file  Social Connections: Not on file  Intimate Partner Violence: Not on file     Physical Exam: Vital signs in last  24 hours: Temp:  [97.5 F (36.4 C)-98.7 F (37.1 C)] 97.6 F (36.4 C) (06/12 1533) Pulse Rate:   [57-77] 68 (06/12 1500) Resp:  [16-28] 20 (06/12 1500) BP: (97-159)/(57-103) 154/76 (06/12 1500) SpO2:  [83 %-100 %] 99 % (06/12 1500) Weight:  [83 kg] 83 kg (06/12 0500) Last BM Date: 08/06/20 General:   Alert, sitting up in bed in no distress, rather thin but not really severely chronically ill-appearing. Head:  Normocephalic and atraumatic. Eyes:  Sclera clear, no icterus.   Lungs: Reasonably good air movement, no wheezes, a few crackles at the right base. Heart:   Regular rate and rhythm; no murmurs, clicks, rubs,  or gallops. Abdomen:  Soft, nontender,  and nondistended.  Msk:   Symmetrical without gross deformities. Extremities:   Without clubbing, cyanosis, or edema. Neurologic:  Alert and coherent;  grossly normal neurologically. Skin:  Intact without significant lesions or rashes. Psych:   Alert and cooperative. Normal mood and affect.  Intake/Output from previous day: 06/11 0701 - 06/12 0700 In: 584.2 [I.V.:234.2; IV Piggyback:350] Out: 750 [Urine:750] Intake/Output this shift: Total I/O In: 132.4 [I.V.:32.4; IV Piggyback:100] Out: 600 [Urine:600]  Lab Results: Recent Labs    08/05/20 0441 08/06/20 0116  WBC 11.6* 11.2*  HGB 11.5* 10.8*  HCT 34.4* 32.6*  PLT PLATELET CLUMPS NOTED ON SMEAR, UNABLE TO ESTIMATE 93*   BMET Recent Labs    08/05/20 0441 08/06/20 0116 08/06/20 1015  NA 136 134* 136  K 4.5 3.6 4.1  CL 102 103 101  CO2 24 24 28   GLUCOSE 113* 109* 109*  BUN 51* 47* 43*  CREATININE 2.71* 2.18* 2.01*  CALCIUM 8.8* 8.6* 8.8*   LFT Recent Labs    08/05/20 0441  PROT 5.7*  ALBUMIN 2.4*  AST 20  ALT 17  ALKPHOS 46  BILITOT 0.8   PT/INR No results for input(s): LABPROT, INR in the last 72 hours.  Studies/Results: DG Chest Port 1 View  Result Date: 08/05/2020 CLINICAL DATA:  Short of breath EXAM: PORTABLE CHEST 1 VIEW COMPARISON:  CT scan of the chest and chest x-ray 07/30/2020 FINDINGS: Cardiac and mediastinal contours are within normal  limits. Atherosclerotic calcifications present along the transverse aorta. Stable appearance of the lungs with mild bronchitic changes. No focal airspace infiltrate, pleural effusion, pneumothorax or other acute process. No acute fracture or malalignment. IMPRESSION: Stable chest x-ray without evidence of acute cardiopulmonary process. Electronically Signed   By: Jacqulynn Cadet M.D.   On: 08/05/2020 10:06   ECHOCARDIOGRAM COMPLETE  Result Date: 08/06/2020    ECHOCARDIOGRAM REPORT   Patient Name:   [name] [name] Maybee Date of Exam: 08/06/2020 Medical Rec #:  [id number]    Height:       69.0 in Accession #:    [id number]   Weight:       183.0 lb Date of Birth:  [birth year]    BSA:          1.989 m Patient Age:    53 years     BP:           128/74 mmHg Patient Gender: M            HR:           69 bpm. Exam Location:  Inpatient Procedure: 2D Echo, Cardiac Doppler, Color Doppler and Intracardiac            Opacification Agent Indications:    Hypotension  History:        Patient  has prior history of Echocardiogram examinations, most                 recent 07/31/2020. COPD; Risk Factors:Current Smoker, Hypertension                 and Dyslipidemia.  Sonographer:    Clayton Lefort RDCS (AE) Referring Phys: [id number] Arnell Asal  Sonographer Comments: Technically difficult study due to poor echo windows. Image acquisition challenging due to COPD and Image acquisition challenging due to respiratory motion. IMPRESSIONS  1. Left ventricular ejection fraction, by estimation, is 55 to 60%. The left ventricle has normal function. The left ventricle has no regional wall motion abnormalities. Left ventricular diastolic parameters are consistent with Grade I diastolic dysfunction (impaired relaxation).  2. Right ventricular systolic function is normal. The right ventricular size is normal. There is normal pulmonary artery systolic pressure. The estimated right ventricular systolic pressure is 16.0 mmHg.  3. The mitral valve is normal in  structure. No evidence of mitral valve regurgitation. No evidence of mitral stenosis.  4. The aortic valve has an indeterminant number of cusps. Aortic valve regurgitation is not visualized. Mild aortic valve sclerosis is present, with no evidence of aortic valve stenosis.  5. The inferior vena cava is dilated in size with <50% respiratory variability, suggesting right atrial pressure of 15 mmHg. FINDINGS  Left Ventricle: Left ventricular ejection fraction, by estimation, is 55 to 60%. The left ventricle has normal function. The left ventricle has no regional wall motion abnormalities. Definity contrast agent was given IV to delineate the left ventricular  endocardial borders. The left ventricular internal cavity size was normal in size. There is no left ventricular hypertrophy. Left ventricular diastolic parameters are consistent with Grade I diastolic dysfunction (impaired relaxation). Normal left ventricular filling pressure. Right Ventricle: The right ventricular size is normal. No increase in right ventricular wall thickness. Right ventricular systolic function is normal. There is normal pulmonary artery systolic pressure. The tricuspid regurgitant velocity is 1.48 m/s, and  with an assumed right atrial pressure of 15 mmHg, the estimated right ventricular systolic pressure is 73.7 mmHg. Left Atrium: Left atrial size was normal in size. Right Atrium: Right atrial size was normal in size. Pericardium: There is no evidence of pericardial effusion. Mitral Valve: The mitral valve is normal in structure. No evidence of mitral valve regurgitation. No evidence of mitral valve stenosis. MV peak gradient, 3.7 mmHg. The mean mitral valve gradient is 1.0 mmHg. Tricuspid Valve: The tricuspid valve is normal in structure. Tricuspid valve regurgitation is not demonstrated. No evidence of tricuspid stenosis. Aortic Valve: The aortic valve has an indeterminant number of cusps. Aortic valve regurgitation is not visualized. Mild  aortic valve sclerosis is present, with no evidence of aortic valve stenosis. Aortic valve mean gradient measures 7.0 mmHg. Aortic valve peak gradient measures 13.2 mmHg. Aortic valve area, by VTI measures 1.33 cm. Pulmonic Valve: The pulmonic valve was normal in structure. Pulmonic valve regurgitation is not visualized. No evidence of pulmonic stenosis. Aorta: The aortic root is normal in size and structure. Venous: The inferior vena cava is dilated in size with less than 50% respiratory variability, suggesting right atrial pressure of 15 mmHg. IAS/Shunts: The interatrial septum appears to be lipomatous. No atrial level shunt detected by color flow Doppler.  LEFT VENTRICLE PLAX 2D LVIDd:         5.40 cm  Diastology LVIDs:         4.00 cm  LV e' medial:  6.31 cm/s LV PW:         0.90 cm  LV E/e' medial:  10.5 LV IVS:        0.70 cm  LV e' lateral:   6.96 cm/s LVOT diam:     2.00 cm  LV E/e' lateral: 9.5 LV SV:         53 LV SV Index:   27 LVOT Area:     3.14 cm  RIGHT VENTRICLE             IVC RV Basal diam:  3.20 cm     IVC diam: 2.20 cm RV S prime:     11.70 cm/s TAPSE (M-mode): 2.8 cm LEFT ATRIUM           Index       RIGHT ATRIUM           Index LA diam:      3.70 cm 1.86 cm/m  RA Area:     14.60 cm LA Vol (A4C): 48.4 ml 24.33 ml/m RA Volume:   36.20 ml  18.20 ml/m  AORTIC VALVE AV Area (Vmax):    1.42 cm AV Area (Vmean):   1.57 cm AV Area (VTI):     1.33 cm AV Vmax:           182.00 cm/s AV Vmean:          117.000 cm/s AV VTI:            0.403 m AV Peak Grad:      13.2 mmHg AV Mean Grad:      7.0 mmHg LVOT Vmax:         82.20 cm/s LVOT Vmean:        58.300 cm/s LVOT VTI:          0.170 m LVOT/AV VTI ratio: 0.42  AORTA Ao Root diam: 3.20 cm MITRAL VALVE               TRICUSPID VALVE MV Area (PHT): 3.37 cm    TR Peak grad:   8.8 mmHg MV Area VTI:   1.64 cm    TR Vmax:        148.00 cm/s MV Peak grad:  3.7 mmHg MV Mean grad:  1.0 mmHg    SHUNTS MV Vmax:       0.97 m/s    Systemic VTI:  0.17 m MV  Vmean:      57.9 cm/s   Systemic Diam: 2.00 cm MV Decel Time: 225 msec MV E velocity: 66.40 cm/s MV A velocity: 84.40 cm/s MV E/A ratio:  0.79 Fransico Him MD Electronically signed by Fransico Him MD Signature Date/Time: 08/06/2020/10:29:07 AM    Final     Impression: I agree with Dr. Watt Climes that this patient's dysphagia, which is progressive and quite bothersome to the patient, is probably due to esophageal dysmotility rather than esophageal stricturing.  Plan: The patient, his daughter at the bedside, and his son at the bedside discussed with the various options: We discussed continuing the status quo, proceeding to Botox injections, or arranging outpatient esophageal manometry (which would mean delaying treatment by weeks or perhaps even a month or 2) to try to further clarify the nature of his swallowing problem.  After all this discussion, we have decided to proceed with endoscopy and Botox injections.  Since the patient will be in the hospital for days convalescing from his pneumonia, and since he is still in the cardiac care unit, there is  no particular rush to getting him on the schedule and so I will tentatively plan it for Tuesday or Wednesday of this coming week, depending on his clinical evolution in the meantime.   LOS: 1 day   [name] [name]  08/06/2020, 3:59 PM   Pager [phone] If no answer or after 5 PM call [phone]

## 2020-08-06 NOTE — Progress Notes (Signed)
  Echocardiogram 2D Echocardiogram has been performed.  [name] [name] 08/06/2020, 9:57 AM

## 2020-08-07 ENCOUNTER — Inpatient Hospital Stay (HOSPITAL_COMMUNITY): Payer: Medicare Other

## 2020-08-07 DIAGNOSIS — I959 Hypotension, unspecified: Secondary | ICD-10-CM

## 2020-08-07 LAB — CBC WITH DIFFERENTIAL/PLATELET
Abs Immature Granulocytes: 0.05 10*3/uL (ref 0.00–0.07)
Basophils Absolute: 0 10*3/uL (ref 0.0–0.1)
Basophils Relative: 0 %
Eosinophils Absolute: 0 10*3/uL (ref 0.0–0.5)
Eosinophils Relative: 0 %
HCT: 36.5 % — ABNORMAL LOW (ref 39.0–52.0)
Hemoglobin: 12.3 g/dL — ABNORMAL LOW (ref 13.0–17.0)
Immature Granulocytes: 1 %
Lymphocytes Relative: 11 %
Lymphs Abs: 1 10*3/uL (ref 0.7–4.0)
MCH: 32.9 pg (ref 26.0–34.0)
MCHC: 33.7 g/dL (ref 30.0–36.0)
MCV: 97.6 fL (ref 80.0–100.0)
Monocytes Absolute: 1.1 10*3/uL — ABNORMAL HIGH (ref 0.1–1.0)
Monocytes Relative: 12 %
Neutro Abs: 7.2 10*3/uL (ref 1.7–7.7)
Neutrophils Relative %: 76 %
Platelets: 93 10*3/uL — ABNORMAL LOW (ref 150–400)
RBC: 3.74 MIL/uL — ABNORMAL LOW (ref 4.22–5.81)
RDW: 13 % (ref 11.5–15.5)
WBC: 9.4 10*3/uL (ref 4.0–10.5)
nRBC: 0 % (ref 0.0–0.2)

## 2020-08-07 LAB — GLUCOSE, CAPILLARY
Glucose-Capillary: 56 mg/dL — ABNORMAL LOW (ref 70–99)
Glucose-Capillary: 97 mg/dL (ref 70–99)
Glucose-Capillary: 91 mg/dL (ref 70–99)

## 2020-08-07 LAB — BASIC METABOLIC PANEL
Anion gap: 11 (ref 5–15)
BUN: 32 mg/dL — ABNORMAL HIGH (ref 8–23)
CO2: 25 mmol/L (ref 22–32)
Calcium: 8.8 mg/dL — ABNORMAL LOW (ref 8.9–10.3)
Chloride: 97 mmol/L — ABNORMAL LOW (ref 98–111)
Creatinine, Ser: 1.58 mg/dL — ABNORMAL HIGH (ref 0.61–1.24)
GFR, Estimated: 44 mL/min — ABNORMAL LOW (ref >60)
Glucose, Bld: 154 mg/dL — ABNORMAL HIGH (ref 70–99)
Potassium: 4.5 mmol/L (ref 3.5–5.1)
Sodium: 133 mmol/L — ABNORMAL LOW (ref 135–145)

## 2020-08-07 LAB — SAVE SMEAR(SSMR), FOR PROVIDER SLIDE REVIEW

## 2020-08-07 MED ORDER — SODIUM CHLORIDE 0.9 % IV SOLN
12.5000 mg | Freq: Four times a day (QID) | INTRAVENOUS | Status: DC | PRN
  Administered 2020-08-07: 12.5 mg via INTRAVENOUS
  Filled 2020-08-07 (×3): qty 0.5

## 2020-08-07 MED ORDER — DEXTROSE 50 % IV SOLN
25.0000 mL | Freq: Once | INTRAVENOUS | Status: AC
  Administered 2020-08-07: 25 mL via INTRAVENOUS

## 2020-08-07 MED ORDER — GUAIFENESIN 200 MG PO TABS
200.0000 mg | ORAL_TABLET | Freq: Four times a day (QID) | ORAL | Status: DC
  Administered 2020-08-07 – 2020-08-09 (×5): 200 mg via ORAL
  Filled 2020-08-07 (×11): qty 1

## 2020-08-07 MED ORDER — DEXTROSE-NACL 5-0.45 % IV SOLN: INTRAVENOUS | Status: DC

## 2020-08-07 MED FILL — Perflutren Lipid Microsphere IV Susp 1.1 MG/ML: INTRAVENOUS | Qty: 10 | Status: AC

## 2020-08-07 NOTE — Progress Notes (Signed)
The patient looks pretty good today.  He is sitting up in the bedside chair, no evident distress, not on supplemental oxygen.  On exam, he has some expiratory rhonchi at the left base but overall the lungs sound reasonably clear.  His white count is improved, currently 9400.  Impression:  1.  Improved (presumed) community-acquired pneumonia 2.  Esophageal dysphagia, probably due primarily to dysmotility, with perhaps an element of stricturing  Recommendations:  1.  Please see my note from yesterday.  I have tentatively scheduled the patient for an upper endoscopy with Botox injection tomorrow, to include possible esophageal dilatation if a defined stricture is identified.  I am awaiting word from the patient's attending physician regarding their opinion as to the patient's clinical suitability for endoscopy with sedation tomorrow.  My personal impression is that he is an acceptable condition for the procedure, but if needed, we could put off the procedure until later in the patient's hospitalization.  2.  I am also obtaining a barium swallow with tablet (planned for around 1:30 PM today) to try to clarify the current status of the patient's esophagus with respect to stricturing and/or dysmotility.  Cleotis Nipper, M.D. Pager [phone] If no answer or after 5 PM call [phone]

## 2020-08-07 NOTE — Progress Notes (Signed)
Initial Nutrition Assessment RD working remotely.  DOCUMENTATION CODES:   Not applicable  INTERVENTION:  - diet advancement per SLP recommendation following MBS. - will order appropriate oral nutrition supplements at follow-up.  - will order 1 tablet multivitamin with minerals/day. - complete NFPE at follow-up.   NUTRITION DIAGNOSIS:   Inadequate oral intake related to inability to eat as evidenced by NPO status.  GOAL:   Patient will meet greater than or equal to 90% of their needs  MONITOR:   Diet advancement, PO intake, Supplement acceptance, Labs, Weight trends, I & O's  REASON FOR ASSESSMENT:   Malnutrition Screening Tool  ASSESSMENT:   80 year old male with medical history of CAD, COPD, DDD, OSA, anxiety, and BPH. He presented to the ED due to SOB, cough with whitish sputum, fatigue, vomiting, and confusion. He was admitted 6/5-6/6 d/t syncope. He was started on IV abx d/t possible CAP and transferred from [facility] to [facility] under PCCM service.  RD working on another campus and unable to see patient in person today. Patient noted to currently be in Diagnostic Radiology.   Diet advanced from NPO to Heart Healthy on 6/11 at 0945 and then changed back to NPO today at 1020 and plan to be NPO again starting at midnight. No intakes documented from time on Heart Healthy diet.   He was last seen by a [place] RD on 11/20/19 and was s/p cystoscopy with R retrograde pyelogram and R ureteral stent placement on 9/24. Patient was unavailable at the time of RD visit on 9/25 according to RD note. Meal completions were being documented as 100% at that time and patient was consuming Ensure supplements.  Weight today is 180 lb, weight yesterday was 183 lb, and weight on 6/11 was 184 lb. Weight has been stable since 03/09/20. Weight on 11/23/19 was 185 lb.  No information documented in the edema section of flow sheet.   Per notes: - hypotension - AKI - sepsis - CAP -  GERD/achalasia s/p dilation on 03/09/20; plan for MBS today and EGD 6/14   Labs reviewed; CBG: 56 mg/dl, BUN: 43 mg/dl, creatinine: 2.01 mg/dl, Ca: 8.8 mg/dl, GFR: 33 ml/min. Medications reviewed; 3 mg melatonin/night, 40 mg oral protonix/day, 40 mEq Klor-Con x1 dose 6/12. IVF; D5-1/2 NS @ 100 ml/hr (408 kcal/24 hrs).     NUTRITION - FOCUSED PHYSICAL EXAM:  Unable to complete.  Diet Order:   Diet Order             Diet NPO time specified  Diet effective now                   EDUCATION NEEDS:   Not appropriate for education at this time  Skin:  Skin Assessment: Reviewed RN Assessment  Last BM:  6/12 (type 2 x2)  Height:   Ht Readings from Last 1 Encounters:  08/05/20 5\' 9"  (1.753 m)    Weight:   Wt Readings from Last 1 Encounters:  08/07/20 81.6 kg      Estimated Nutritional Needs:  Kcal:  1875-2050 kcal Protein:  90-105 grams Fluid:  >/= 2 L/day       Jarome Matin, MS, RD, LDN, CNSC Inpatient Clinical Dietitian RD pager # available in [place]  After hours/weekend pager # available in [facility]

## 2020-08-07 NOTE — Evaluation (Signed)
Physical Therapy Evaluation Patient Details Name: [name] [name] MRN: [id number] DOB: [birth year] Today's Date: 08/07/2020   History of Present Illness  80 yo admitted 6/11 as transfer from [facility] with SOB, CAP and hypotension. PMHx: CAD, COPD, OSA, esophageal dysfunction, anxiety, BPH  Clinical Impression  PT pleasant, moving well and able to walk limited distance in room. Pt with generalized weakness and gait deficits with increased difficulty with transfers and mobility who will benefit from acute therapy to maximize mobility, safety and function to decrease burden of care.   HR 66-80 Pre gait 146/77 Post 126/86 SpO2 95% on RA    Follow Up Recommendations Home health PT;Supervision for mobility/OOB    Equipment Recommendations  3in1 (PT)    Recommendations for Other Services OT consult     Precautions / Restrictions Precautions Precautions: Fall      Mobility  Bed Mobility Overal bed mobility: Needs Assistance Bed Mobility: Supine to Sit     Supine to sit: Supervision     General bed mobility comments: supervision with rail and increased time, HOB 15 degrees    Transfers Overall transfer level: Needs assistance   Transfers: Sit to/from Stand Sit to Stand: Min guard;Min assist         General transfer comment: minguard to rise from bed, min assist with rail to rise from toilet  Ambulation/Gait Ambulation/Gait assistance: Min guard Gait Distance (Feet): 20 Feet Assistive device: Rolling walker (2 wheeled) Gait Pattern/deviations: Step-through pattern;Decreased stride length;Trunk flexed   Gait velocity interpretation: <1.8 ft/sec, indicate of risk for recurrent falls General Gait Details: initial gait with HHA and unsteady relying on external support grossly 2' then sat and used RW for gait 20' then 10' cues for posture and proximity to [employer] (Stroke Patients Only)       Balance  Overall balance assessment: Needs assistance   Sitting balance-Leahy Scale: Fair Sitting balance - Comments: pt able to sit eOB and toilet without support   Standing balance support: Bilateral upper extremity supported Standing balance-Leahy Scale: Poor Standing balance comment: able to stand statically briefly without support but requires RW for stability in standing and with gait                             Pertinent Vitals/Pain Pain Assessment: No/denies pain    Home Living Family/patient expects to be discharged to:: Private residence Living Arrangements: Children Available Help at Discharge: Family;Available 24 hours/day Type of Home: House Home Access: Level entry     Home Layout: Two level;Bed/bath upstairs Home Equipment: Walker - 2 wheels;Cane - single point      Prior Function Level of Independence: Independent         Comments: pt reports being independent until grossly 2 weeks PTA and has been weak and having difficulty moving around since that time     Hand Dominance        Extremity/Trunk Assessment   Upper Extremity Assessment Upper Extremity Assessment: Generalized weakness    Lower Extremity Assessment Lower Extremity Assessment: Generalized weakness    Cervical / Trunk Assessment Cervical / Trunk Assessment: Kyphotic  Communication      Cognition Arousal/Alertness: Awake/alert Behavior During Therapy: WFL for tasks assessed/performed Overall Cognitive Status: Within Functional Limits for tasks assessed  General Comments      Exercises     Assessment/Plan    PT Assessment Patient needs continued PT services  PT Problem List Decreased mobility;Decreased activity tolerance;Decreased balance;Decreased knowledge of use of DME;Decreased strength       PT Treatment Interventions Gait training;Balance training;DME instruction;Therapeutic exercise;Stair training;Functional  mobility training;Therapeutic activities;Patient/family education    PT Goals (Current goals can be found in the Care Plan section)  Acute Rehab PT Goals Patient Stated Goal: return home PT Goal Formulation: With patient/family Time For Goal Achievement: 08/21/20 Potential to Achieve Goals: Fair    Frequency Min 3X/week   Barriers to discharge        Co-evaluation               AM-PAC PT "6 Clicks" Mobility  Outcome Measure Help needed turning from your back to your side while in a flat bed without using bedrails?: A Little Help needed moving from lying on your back to sitting on the side of a flat bed without using bedrails?: A Little Help needed moving to and from a bed to a chair (including a wheelchair)?: A Little Help needed standing up from a chair using your arms (e.g., wheelchair or bedside chair)?: A Little Help needed to walk in hospital room?: A Little Help needed climbing 3-5 steps with a railing? : A Lot 6 Click Score: 17    End of Session Equipment Utilized During Treatment: Gait belt Activity Tolerance: Patient tolerated treatment well Patient left: in chair;with call bell/phone within reach;with chair alarm set Nurse Communication: Mobility status PT Visit Diagnosis: Other abnormalities of gait and mobility (R26.89);Difficulty in walking, not elsewhere classified (R26.2);Muscle weakness (generalized) (M62.81)    Time: 3202-3343 PT Time Calculation (min) (ACUTE ONLY): 27 min   Charges:   PT Evaluation $PT Eval Moderate Complexity: 1 Mod PT Treatments $Gait Training: 8-22 mins        Shykeria Sakamoto P, PT Acute Rehabilitation Services Pager: [phone] Office: [place] 08/07/2020, 9:04 AM

## 2020-08-07 NOTE — Progress Notes (Signed)
PROGRESS NOTE    [name] [name]  KGY:[id number] DOB: 08-31-1940 DOA: 08/05/2020 PCP: [name], MD   Chief Complain: Hypotension  Brief Narrative: Patient is a 80 year old male with history of coronary artery disease, COPD, degenerative disease, OSA, anxiety, BPH who presented to [facility] ER for shortness of breath, cough with whitish sputum, fatigue, vomiting, confusion.  He was recently admitted here from 6/5-6/6 for work-up of syncope showed, work-up was benign and was discharged.  As per the daughter, patient has had some chills along with cough.  On presentation at [facility] ER, he was hypotensive so he was started on IV fluids but was held because he developed right lower lobe crackles, elevated JVD.  Lactic acid was 2.9 on presentation.  He had mild grade fever.  Patient was started on IV antibiotics for possible community-acquired pneumonia.  He was started on Levophed for hypotension and was transferred to [facility] under [facility] service.  Patient transferred to [facility] on 08/06/2020.  On presentation, level also showed AKI with creatinine of 2.7, creatinine was normal on 6/5.  Currently he is hemodynamically stable.  Patient continues to complain of weakness.  Planning for PT/OT evaluation and continuing IV fluids for AKI.  Assessment & Plan:   Active Problems:   Hypotension  Hypotension: Unclear etiology but suspected to be from sepsis versus dehydration.  Patient was also on antihypertensives at home.  On presentation at [facility] ER, he had to be started on Levophed for maintenance of blood pressure and was transferred to ICU here under PCCM service.  Blood pressure has been stable now.  Home medications still on hold.  On Imdur and metoprolol at home  AKI: Baseline creatinine normal.  Creatinine in the range of 2.7 on presentation.  Most likely secondary to dehydration.   Kidney function has responded to IV fluids.  Looks dehydrated.  Continue IV fluids.  Check BMP  tomorrow  Sepsis/community-acquired pneumonia: Presented with low-grade fever, cough with a sputum.  Chest x-ray done after he presented denies any pneumonia but he was suspected to have community-acquired pneumonia at [facility] and was started on IV antibiotics: Azithromycin, ceftriaxone.  Have change dantibiotics to oral (cefdinir)  Patient states he has chronic cough most likely from COPD.  He is still a smoker and smokes a few cigarettes a day.  History of COPD: Currently not in exacerbation.  Continue home regimen on discharge.  He is a current smoker  GERD/achalasia: On Protonix.  He follows with gastroenterology as an outpatient.  Status post dilation on 03/09/2020.  Previous EGD on 1/22 showed benign-appearing esophageal stenosis versus motility disorder  in the distal esophagus.  Speech therapy evaluation done who recommended GI consultation for esophageal dysphagia.  [facility] consulted, plan for barium swallow today and EGD tomorrow, likely botox injection, possible stricture dilation if found. I think he is stable for the procedure.  Hyperlipidemia: On Crestor 10 mg daily  BPH: On Proscar at home  Depression/anxiety: On Wellbutrin  Generalized weakness: Pt advising home health PT  Thrombocytopenia Plts 90s - hold heparin - f/u smear, hcv, hiv - monitor    DVT prophylaxis: SCDs Code Status: Full Family Communication: daughter updated telephonically 6/13 Status is: Inpatient  Remains inpatient appropriate because:Unsafe d/c plan  Dispo: The patient is from: Home              Anticipated d/c is to: Home              Patient currently is not medically  stable to d/c.   Difficult to place patient No     Consultants: PCCM, GI  Procedures:None  Antimicrobials:  Anti-infectives (From admission, onward)    Start     Dose/Rate Route Frequency Ordered Stop   08/06/20 1045  cefdinir (OMNICEF) capsule 300 mg        300 mg Oral Every 12 hours 08/06/20 0951 08/11/20  0959   08/05/20 0500  cefTRIAXone (ROCEPHIN) 2 g in sodium chloride 0.9 % 100 mL IVPB  Status:  Discontinued        2 g 200 mL/hr over 30 Minutes Intravenous Daily 08/05/20 0406 08/06/20 0951   08/05/20 0500  azithromycin (ZITHROMAX) 500 mg in sodium chloride 0.9 % 250 mL IVPB  Status:  Discontinued        500 mg 250 mL/hr over 60 Minutes Intravenous Daily 08/05/20 0406 08/05/20 1704       Subjective:  Patient seen and examined the bedside this morning.  Reports mild cough that is improved, no dyspnea. Small clear vomit with swallowing.   Objective: Vitals:   08/07/20 0700 08/07/20 0800 08/07/20 0900 08/07/20 1000  BP: (!) 146/77 126/86 121/66 132/69  Pulse: 69  69 69  Resp: 19 (!) 21 (!) 23 (!) 23  Temp:  97.6 F (36.4 C)    TempSrc:  Oral    SpO2: 100% 100% 98% 95%  Weight:      Height:        Intake/Output Summary (Last 24 hours) at 08/07/2020 1107 Last data filed at 08/07/2020 1036 Gross per 24 hour  Intake 1779.11 ml  Output 2900 ml  Net -1120.89 ml   Filed Weights   08/05/20 0236 08/06/20 0500 08/07/20 0500  Weight: 83.3 kg 83 kg 81.6 kg    Examination:  General exam: Overall comfortable, pleasant elderly male HEENT:PERRL,Oral mucosa dry, Ear/Nose normal on gross exam Respiratory system: Bilateral  diminished air sounds, mild rhonchi  cardiovascular system: S1 & S2 heard, RRR. No JVD, murmurs, rubs, gallops or clicks. No pedal edema. Gastrointestinal system: Abdomen is nondistended, soft and nontender. No organomegaly or masses felt. Normal bowel sounds heard. Central nervous system: Alert and oriented. No focal neurological deficits. Extremities: No edema, no clubbing ,no cyanosis Skin: No rashes, lesions or ulcers,no icterus ,no pallor   Data Reviewed: I have personally reviewed following labs and imaging studies  CBC: Recent Labs  Lab 08/05/20 0441 08/06/20 0116 08/07/20 0332  WBC 11.6* 11.2* 9.4  NEUTROABS 10.0*  --  7.2  HGB 11.5* 10.8* 12.3*   HCT 34.4* 32.6* 36.5*  MCV 98.3 98.8 97.6  PLT PLATELET CLUMPS NOTED ON SMEAR, UNABLE TO ESTIMATE 93* 93*   Basic Metabolic Panel: Recent Labs  Lab 08/05/20 0441 08/06/20 0116 08/06/20 1015  NA 136 134* 136  K 4.5 3.6 4.1  CL 102 103 101  CO2 24 24 28   GLUCOSE 113* 109* 109*  BUN 51* 47* 43*  CREATININE 2.71* 2.18* 2.01*  CALCIUM 8.8* 8.6* 8.8*  MG 2.1  --   --    GFR: Estimated Creatinine Clearance: 29.3 mL/min (A) (by C-G formula based on SCr of 2.01 mg/dL (H)). Liver Function Tests: Recent Labs  Lab 08/05/20 0441  AST 20  ALT 17  ALKPHOS 46  BILITOT 0.8  PROT 5.7*  ALBUMIN 2.4*   No results for input(s): LIPASE, AMYLASE in the last 168 hours.  No results for input(s): AMMONIA in the last 168 hours. Coagulation Profile: No results for input(s): INR, PROTIME in the  last 168 hours. Cardiac Enzymes: No results for input(s): CKTOTAL, CKMB, CKMBINDEX, TROPONINI in the last 168 hours. BNP (last 3 results) No results for input(s): PROBNP in the last 8760 hours. HbA1C: No results for input(s): HGBA1C in the last 72 hours. CBG: No results for input(s): GLUCAP in the last 168 hours. Lipid Profile: No results for input(s): CHOL, HDL, LDLCALC, TRIG, CHOLHDL, LDLDIRECT in the last 72 hours. Thyroid Function Tests: No results for input(s): TSH, T4TOTAL, FREET4, T3FREE, THYROIDAB in the last 72 hours. Anemia Panel: No results for input(s): VITAMINB12, FOLATE, FERRITIN, TIBC, IRON, RETICCTPCT in the last 72 hours. Sepsis Labs: Recent Labs  Lab 08/05/20 0441  PROCALCITON 13.45  LATICACIDVEN 2.0*    Recent Results (from the past 240 hour(s))  SARS CORONAVIRUS 2 (TAT 6-24 HRS) Nasopharyngeal Nasopharyngeal Swab     Status: None   Collection Time: 07/30/20  2:45 PM   Specimen: Nasopharyngeal Swab  Result Value Ref Range Status   SARS Coronavirus 2 NEGATIVE NEGATIVE Final    Comment: (NOTE) SARS-CoV-2 target nucleic acids are NOT DETECTED.  The SARS-CoV-2 RNA is  generally detectable in upper and lower respiratory specimens during the acute phase of infection. Negative results do not preclude SARS-CoV-2 infection, do not rule out co-infections with other pathogens, and should not be used as the sole basis for treatment or other patient management decisions. Negative results must be combined with clinical observations, patient history, and epidemiological information. The expected result is Negative.  Fact Sheet for Patients: SugarRoll.be  Fact Sheet for Healthcare Providers: https://www.woods-mathews.com/  This test is not yet approved or cleared by the [place] FDA and  has been authorized for detection and/or diagnosis of SARS-CoV-2 by FDA under an Emergency Use Authorization (EUA). This EUA will remain  in effect (meaning this test can be used) for the duration of the COVID-19 declaration under Se ction 564(b)(1) of the Act, 21 U.S.C. section 360bbb-3(b)(1), unless the authorization is terminated or revoked sooner.  Performed at [facility], [place] [address], [place], [place] [address]   Culture, blood (routine x 2)     Status: None (Preliminary result)   Collection Time: 08/05/20  4:30 AM   Specimen: BLOOD LEFT HAND  Result Value Ref Range Status   Specimen Description   Final    BLOOD LEFT HAND Performed at [facility], [address] [place], [place] [address]    Special Requests   Final    BOTTLES DRAWN AEROBIC AND ANAEROBIC Blood Culture adequate volume Performed at [facility], [address] [place], [place] [address]    Culture   Final    NO GROWTH 2 DAYS Performed at [facility], [place] [address] [place], [place] [address]    Report Status PENDING  Incomplete  Culture, blood (routine x 2)     Status: None (Preliminary result)   Collection Time: 08/05/20  4:50 AM   Specimen: BLOOD  Result Value Ref Range Status   Specimen Description    Final    BLOOD RIGHT ANTECUBITAL Performed at [facility], [address] [place], [place] [address]    Special Requests   Final    BOTTLES DRAWN AEROBIC AND ANAEROBIC Blood Culture adequate volume Performed at [facility], [address] [place], [place] [address]    Culture   Final    NO GROWTH 2 DAYS Performed at [facility], [place]  [address], [place], [place] [address]    Report Status PENDING  Incomplete  MRSA Next Gen by PCR, Nasal     Status: None   Collection Time: 08/05/20  4:56 AM  Result Value Ref Range Status   MRSA by PCR Next Gen NOT DETECTED NOT DETECTED Final    Comment: (NOTE) The GeneXpert MRSA Assay (FDA approved for NASAL specimens only), is one component of a comprehensive MRSA colonization surveillance program. It is not intended to diagnose MRSA infection nor to guide or monitor treatment for MRSA infections. Test performance is not FDA approved in patients less than 35 years old. Performed at [facility], [place] [address], [place], [place] [address]   Culture, Urine     Status: None   Collection Time: 08/05/20  4:23 PM   Specimen: Urine, Random  Result Value Ref Range Status   Specimen Description URINE, RANDOM  Final   Special Requests NONE  Final   Culture   Final    NO GROWTH Performed at [facility], [place] [address], [place], [place] [address]    Report Status 08/06/2020 FINAL  Final         Radiology Studies: ECHOCARDIOGRAM COMPLETE  Result Date: 08/06/2020    ECHOCARDIOGRAM REPORT   Patient Name:   [name] [name] Date of Exam: 08/06/2020 Medical Rec #:  [id number]    Height:       69.0 in Accession #:    [id number]   Weight:       183.0 lb Date of Birth:  01/26/1941    BSA:          1.989 m Patient Age:    18 years     BP:           128/74 mmHg Patient Gender: M            HR:           69 bpm. Exam Location:  Inpatient Procedure: 2D Echo, Cardiac Doppler, Color Doppler and Intracardiac             Opacification Agent Indications:    Hypotension  History:        Patient has prior history of Echocardiogram examinations, most                 recent 07/31/2020. COPD; Risk Factors:Current Smoker, Hypertension                 and Dyslipidemia.  Sonographer:    Clayton Lefort RDCS (AE) Referring Phys: [id number] Arnell Asal  Sonographer Comments: Technically difficult study due to poor echo windows. Image acquisition challenging due to COPD and Image acquisition challenging due to respiratory motion. IMPRESSIONS  1. Left ventricular ejection fraction, by estimation, is 55 to 60%. The left ventricle has normal function. The left ventricle has no regional wall motion abnormalities. Left ventricular diastolic parameters are consistent with Grade I diastolic dysfunction (impaired relaxation).  2. Right ventricular systolic function is normal. The right ventricular size is normal. There is normal pulmonary artery systolic pressure. The estimated right ventricular systolic pressure is 97.6 mmHg.  3. The mitral valve is normal in structure. No evidence of mitral valve regurgitation. No evidence of mitral stenosis.  4. The aortic valve has an indeterminant number of cusps. Aortic valve regurgitation is not visualized. Mild aortic valve sclerosis is present, with no evidence of aortic valve stenosis.  5. The inferior vena cava is dilated in size with <50% respiratory variability,  suggesting right atrial pressure of 15 mmHg. FINDINGS  Left Ventricle: Left ventricular ejection fraction, by estimation, is 55 to 60%. The left ventricle has normal function. The left ventricle has no regional wall motion abnormalities. Definity contrast agent was given IV to delineate the left ventricular  endocardial borders. The left ventricular internal cavity size was normal in size. There is no left ventricular hypertrophy. Left ventricular diastolic parameters are consistent with Grade I diastolic dysfunction (impaired relaxation). Normal left  ventricular filling pressure. Right Ventricle: The right ventricular size is normal. No increase in right ventricular wall thickness. Right ventricular systolic function is normal. There is normal pulmonary artery systolic pressure. The tricuspid regurgitant velocity is 1.48 m/s, and  with an assumed right atrial pressure of 15 mmHg, the estimated right ventricular systolic pressure is 24.2 mmHg. Left Atrium: Left atrial size was normal in size. Right Atrium: Right atrial size was normal in size. Pericardium: There is no evidence of pericardial effusion. Mitral Valve: The mitral valve is normal in structure. No evidence of mitral valve regurgitation. No evidence of mitral valve stenosis. MV peak gradient, 3.7 mmHg. The mean mitral valve gradient is 1.0 mmHg. Tricuspid Valve: The tricuspid valve is normal in structure. Tricuspid valve regurgitation is not demonstrated. No evidence of tricuspid stenosis. Aortic Valve: The aortic valve has an indeterminant number of cusps. Aortic valve regurgitation is not visualized. Mild aortic valve sclerosis is present, with no evidence of aortic valve stenosis. Aortic valve mean gradient measures 7.0 mmHg. Aortic valve peak gradient measures 13.2 mmHg. Aortic valve area, by VTI measures 1.33 cm. Pulmonic Valve: The pulmonic valve was normal in structure. Pulmonic valve regurgitation is not visualized. No evidence of pulmonic stenosis. Aorta: The aortic root is normal in size and structure. Venous: The inferior vena cava is dilated in size with less than 50% respiratory variability, suggesting right atrial pressure of 15 mmHg. IAS/Shunts: The interatrial septum appears to be lipomatous. No atrial level shunt detected by color flow Doppler.  LEFT VENTRICLE PLAX 2D LVIDd:         5.40 cm  Diastology LVIDs:         4.00 cm  LV e' medial:    6.31 cm/s LV PW:         0.90 cm  LV E/e' medial:  10.5 LV IVS:        0.70 cm  LV e' lateral:   6.96 cm/s LVOT diam:     2.00 cm  LV E/e'  lateral: 9.5 LV SV:         53 LV SV Index:   27 LVOT Area:     3.14 cm  RIGHT VENTRICLE             IVC RV Basal diam:  3.20 cm     IVC diam: 2.20 cm RV S prime:     11.70 cm/s TAPSE (M-mode): 2.8 cm LEFT ATRIUM           Index       RIGHT ATRIUM           Index LA diam:      3.70 cm 1.86 cm/m  RA Area:     14.60 cm LA Vol (A4C): 48.4 ml 24.33 ml/m RA Volume:   36.20 ml  18.20 ml/m  AORTIC VALVE AV Area (Vmax):    1.42 cm AV Area (Vmean):   1.57 cm AV Area (VTI):     1.33 cm AV Vmax:  182.00 cm/s AV Vmean:          117.000 cm/s AV VTI:            0.403 m AV Peak Grad:      13.2 mmHg AV Mean Grad:      7.0 mmHg LVOT Vmax:         82.20 cm/s LVOT Vmean:        58.300 cm/s LVOT VTI:          0.170 m LVOT/AV VTI ratio: 0.42  AORTA Ao Root diam: 3.20 cm MITRAL VALVE               TRICUSPID VALVE MV Area (PHT): 3.37 cm    TR Peak grad:   8.8 mmHg MV Area VTI:   1.64 cm    TR Vmax:        148.00 cm/s MV Peak grad:  3.7 mmHg MV Mean grad:  1.0 mmHg    SHUNTS MV Vmax:       0.97 m/s    Systemic VTI:  0.17 m MV Vmean:      57.9 cm/s   Systemic Diam: 2.00 cm MV Decel Time: 225 msec MV E velocity: 66.40 cm/s MV A velocity: 84.40 cm/s MV E/A ratio:  0.79 Fransico Him MD Electronically signed by Fransico Him MD Signature Date/Time: 08/06/2020/10:29:07 AM    Final         Scheduled Meds:  cefdinir  300 mg Oral Q12H   Chlorhexidine Gluconate Cloth  6 each Topical Daily   guaiFENesin  600 mg Oral BID   heparin  5,000 Units Subcutaneous Q8H   melatonin  3 mg Oral QHS   nitroGLYCERIN  0.4 mg Sublingual TID AC   pantoprazole  40 mg Oral Daily   Continuous Infusions:  sodium chloride Stopped (08/06/20 0749)   sodium chloride 75 mL/hr at 08/07/20 1000     LOS: 2 days    Time spent: 30 mins.More than 50% of that time was spent in counseling and/or coordination of care.      Desma Maxim, MD [facility] P6/13/2022, 11:07 AM

## 2020-08-07 NOTE — Evaluation (Signed)
Occupational Therapy Evaluation Patient Details Name: [name] [name] MRN: [id number] DOB: [birth year] Today's Date: 08/07/2020    History of Present Illness 80 yo admitted 6/11 as transfer from [facility] with SOB, CAP and hypotension. PMHx: CAD, COPD, OSA, esophageal dysfunction, anxiety, BPH   Clinical Impression   PTA patient reports independent with ADLs, limited IADLs (daughter completes med mgmt). Admitted for above and presenting with problem list below, including generalized weakness and decreased activity tolerance.  Patient currently requires min assist for transfers using RW and up to min assist for LB ADLs.  VSS on RA today.  He will benefit from continued OT services while admitted and after dc at [facility] level to optimize independence and safety with ADLs, IADLs and mobility.     Follow Up Recommendations  Home health OT;Supervision - Intermittent    Equipment Recommendations  None recommended by OT    Recommendations for Other Services       Precautions / Restrictions Precautions Precautions: Fall Restrictions Weight Bearing Restrictions: No      Mobility Bed Mobility Overal bed mobility: Needs Assistance Bed Mobility: Supine to Sit     Supine to sit: Supervision     General bed mobility comments: OOB in recliner upon entry    Transfers Overall transfer level: Needs assistance Equipment used: Rolling walker (2 wheeled) Transfers: Sit to/from Stand Sit to Stand: Min assist         General transfer comment: min assist to power up and steady from recliner, cueing for hand placement    Balance Overall balance assessment: Needs assistance Sitting-balance support: No upper extremity supported;Feet supported Sitting balance-Leahy Scale: Fair Sitting balance - Comments: supervision dynamically   Standing balance support: Bilateral upper extremity supported;During functional activity Standing balance-Leahy Scale: Poor Standing balance comment: relies  on BUE Support                           ADL either performed or assessed with clinical judgement   ADL Overall ADL's : Needs assistance/impaired     Grooming: Set up;Sitting   Upper Body Bathing: Set up;Sitting   Lower Body Bathing: Minimal assistance;Sit to/from stand   Upper Body Dressing : Set up;Sitting   Lower Body Dressing: Minimal assistance;Sit to/from stand Lower Body Dressing Details (indicate cue type and reason): requires assist with socks, min assist sit to stand Toilet Transfer: Minimal assistance;RW Toilet Transfer Details (indicate cue type and reason): simulated in room Toileting- Clothing Manipulation and Hygiene: Min guard;Sit to/from stand       Functional mobility during ADLs: Minimal assistance;Rolling walker;Cueing for safety General ADL Comments: pt limited by decreased activity tolerance, generalized weakness, impaired balance     Vision   Vision Assessment?: No apparent visual deficits     Perception     Praxis      Pertinent Vitals/Pain Pain Assessment: No/denies pain     Hand Dominance Left   Extremity/Trunk Assessment Upper Extremity Assessment Upper Extremity Assessment: Generalized weakness;RUE deficits/detail;LUE deficits/detail RUE Deficits / Details: Chronic R shoulder deficits with limited FF t o 30* RUE Sensation: history of peripheral neuropathy LUE Sensation: history of peripheral neuropathy   Lower Extremity Assessment Lower Extremity Assessment: Defer to PT evaluation   Cervical / Trunk Assessment Cervical / Trunk Assessment: Kyphotic   Communication Communication Communication: No difficulties   Cognition Arousal/Alertness: Awake/alert Behavior During Therapy: WFL for tasks assessed/performed Overall Cognitive Status: Within Functional Limits for tasks assessed  General Comments  VSS    Exercises     Shoulder Instructions      Home Living  Family/patient expects to be discharged to:: Private residence Living Arrangements: Children Available Help at Discharge: Family;Available 24 hours/day Type of Home: House Home Access: Level entry     Home Layout: Two level;Bed/bath upstairs     Bathroom Shower/Tub: Teacher, early years/pre: Standard     Home Equipment: Environmental consultant - 2 wheels;Cane - single point;Shower seat;Grab bars - tub/shower          Prior Functioning/Environment Level of Independence: Independent        Comments: reports independent ADLs, mobility and limited driving (daughter assists with IADLS and meds)- reports increased weakness over the last 2 weeks        OT Problem List: Decreased strength;Decreased activity tolerance;Impaired balance (sitting and/or standing);Decreased safety awareness;Decreased knowledge of use of DME or AE;Decreased knowledge of precautions;Cardiopulmonary status limiting activity      OT Treatment/Interventions: Self-care/ADL training;Therapeutic exercise;DME and/or AE instruction;Therapeutic activities;Patient/family education;Balance training;Energy conservation    OT Goals(Current goals can be found in the care plan section) Acute Rehab OT Goals Patient Stated Goal: return home OT Goal Formulation: With patient Time For Goal Achievement: 08/21/20 Potential to Achieve Goals: Good  OT Frequency: Min 2X/week   Barriers to D/C:            Co-evaluation              AM-PAC OT "6 Clicks" Daily Activity     Outcome Measure Help from another person eating meals?: None Help from another person taking care of personal grooming?: A Little Help from another person toileting, which includes using toliet, bedpan, or urinal?: A Little Help from another person bathing (including washing, rinsing, drying)?: A Little Help from another person to put on and taking off regular upper body clothing?: A Little Help from another person to put on and taking off regular lower  body clothing?: A Little 6 Click Score: 19   End of Session Equipment Utilized During Treatment: Rolling walker Nurse Communication: Mobility status  Activity Tolerance: Patient tolerated treatment well Patient left: in chair;with call bell/phone within reach;with chair alarm set;with nursing/sitter in room  OT Visit Diagnosis: Other abnormalities of gait and mobility (R26.89);Muscle weakness (generalized) (M62.81)                Time: 6808-8110 OT Time Calculation (min): 17 min Charges:  OT General Charges $OT Visit: 1 Visit OT Evaluation $OT Eval Moderate Complexity: 1 Mod  Jolaine Artist, OT Acute Rehabilitation Services Pager [phone] Office [phone]   Delight Stare 08/07/2020, 12:59 PM

## 2020-08-08 ENCOUNTER — Inpatient Hospital Stay (HOSPITAL_COMMUNITY): Payer: Medicare Other | Admitting: Registered Nurse

## 2020-08-08 ENCOUNTER — Encounter (HOSPITAL_COMMUNITY): Payer: Self-pay | Admitting: Pulmonary Disease

## 2020-08-08 ENCOUNTER — Encounter (HOSPITAL_COMMUNITY)
Admission: AD | Disposition: A | Discharge status: Home Health | Payer: Self-pay | Source: Other Acute Inpatient Hospital | Attending: Internal Medicine

## 2020-08-08 DIAGNOSIS — R1319 Other dysphagia: Secondary | ICD-10-CM

## 2020-08-08 DIAGNOSIS — J189 Pneumonia, unspecified organism: Secondary | ICD-10-CM

## 2020-08-08 HISTORY — PX: BOTOX INJECTION: SHX5754

## 2020-08-08 HISTORY — PX: ESOPHAGOGASTRODUODENOSCOPY (EGD) WITH PROPOFOL: SHX5813

## 2020-08-08 LAB — BASIC METABOLIC PANEL
Anion gap: 6 (ref 5–15)
BUN: 25 mg/dL — ABNORMAL HIGH (ref 8–23)
CO2: 28 mmol/L (ref 22–32)
Calcium: 8.5 mg/dL — ABNORMAL LOW (ref 8.9–10.3)
Chloride: 99 mmol/L (ref 98–111)
Creatinine, Ser: 1.59 mg/dL — ABNORMAL HIGH (ref 0.61–1.24)
GFR, Estimated: 44 mL/min — ABNORMAL LOW (ref >60)
Glucose, Bld: 111 mg/dL — ABNORMAL HIGH (ref 70–99)
Potassium: 4.2 mmol/L (ref 3.5–5.1)
Sodium: 133 mmol/L — ABNORMAL LOW (ref 135–145)

## 2020-08-08 LAB — GLUCOSE, CAPILLARY: Glucose-Capillary: 86 mg/dL (ref 70–99)

## 2020-08-08 LAB — HIV ANTIBODY (ROUTINE TESTING W REFLEX): HIV Screen 4th Generation wRfx: NONREACTIVE

## 2020-08-08 LAB — HEPATITIS C ANTIBODY: HCV Ab: NONREACTIVE

## 2020-08-08 SURGERY — ESOPHAGOGASTRODUODENOSCOPY (EGD) WITH PROPOFOL
Anesthesia: Monitor Anesthesia Care

## 2020-08-08 MED ORDER — SODIUM CHLORIDE 0.9 % IV SOLN: INTRAVENOUS | Status: DC | PRN

## 2020-08-08 MED ORDER — SODIUM CHLORIDE 0.9 % IV SOLN: INTRAVENOUS | Status: DC

## 2020-08-08 MED ORDER — ONABOTULINUMTOXINA 100 UNITS IJ SOLR
INTRAMUSCULAR | Status: DC | PRN
  Administered 2020-08-08: 1 via INTRAMUSCULAR

## 2020-08-08 MED ORDER — PROPOFOL 500 MG/50ML IV EMUL
INTRAVENOUS | Status: DC | PRN
  Administered 2020-08-08: 75 ug/kg/min via INTRAVENOUS

## 2020-08-08 MED ORDER — PROPOFOL 10 MG/ML IV BOLUS
INTRAVENOUS | Status: DC | PRN
  Administered 2020-08-08 (×2): 30 mg via INTRAVENOUS

## 2020-08-08 MED ORDER — LIDOCAINE 2% (20 MG/ML) 5 ML SYRINGE
INTRAMUSCULAR | Status: DC | PRN
  Administered 2020-08-08: 60 mg via INTRAVENOUS

## 2020-08-08 MED ORDER — ONABOTULINUMTOXINA 100 UNITS IJ SOLR
INTRAMUSCULAR | Status: AC
  Filled 2020-08-08: qty 100

## 2020-08-08 SURGICAL SUPPLY — 15 items

## 2020-08-08 NOTE — Anesthesia Preprocedure Evaluation (Signed)
Anesthesia Evaluation  Patient identified by MRN, date of birth, ID band Patient awake    Reviewed: Allergy & Precautions, NPO status , Patient's Chart, lab work & pertinent test results, reviewed documented beta blocker date and time   Airway Mallampati: II  TM Distance: >3 FB Neck ROM: Full    Dental  (+) Edentulous Upper, Edentulous Lower   Pulmonary sleep apnea (does not use his BiPAP) , COPD, Current Smoker and Patient abstained from smoking.,    Pulmonary exam normal breath sounds clear to auscultation       Cardiovascular hypertension, Pt. on home beta blockers and Pt. 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  There is an 80% stenosis in the continuation branch between the PDA and PLA takeoff.  Mild nonobstructive disease involving the LAD with 30% stenosis after the second diagonal vessel and normal left circumflex coronary artery.  Low normal LV function with EF estimated approximately 50%.  There is a focal area of inferobasilar akinesis.  The EDP is 18 mm Hg    Neuro/Psych PSYCHIATRIC DISORDERS Anxiety Depression negative neurological ROS     GI/Hepatic Neg liver ROS, PUD, GERD  Medicated and Controlled,  Endo/Other  negative endocrine ROS  Renal/GU negative Renal ROS  negative genitourinary   Musculoskeletal  (+) Arthritis ,   Abdominal   Peds  Hematology negative hematology ROS (+)   Anesthesia Other Findings   Reproductive/Obstetrics                             Anesthesia Physical  Anesthesia Plan  ASA: 3  Anesthesia  Plan: MAC   Post-op Pain Management:    Induction:   PONV Risk Score and Plan: Propofol infusion and Treatment may vary due to age or medical condition  Airway Management Planned: Natural Airway and Mask  Additional Equipment: None  Intra-op Plan:   Post-operative Plan:   Informed Consent: I have reviewed the patients History and Physical, chart, labs and discussed the procedure including the risks, benefits and alternatives for the proposed anesthesia with the patient or authorized representative who has indicated his/her understanding and acceptance.       Plan Discussed with: CRNA  Anesthesia Plan Comments:         Anesthesia Quick Evaluation

## 2020-08-08 NOTE — Progress Notes (Signed)
PROGRESS NOTE    [name] [name]  BZJ:[id number] DOB: Jun 23, 1940 DOA: 08/05/2020 PCP: [name], MD   Chief Complain: Hypotension  Brief Narrative: Patient is a 80 year old male with history of coronary artery disease, COPD, degenerative disease, OSA, anxiety, BPH who presented to [facility] ER for shortness of breath, cough with whitish sputum, fatigue, vomiting, confusion.  He was recently admitted here from 6/5-6/6 for work-up of syncope showed, work-up was benign and was discharged.  As per the daughter, patient has had some chills along with cough.  On presentation at [facility] ER, he was hypotensive so he was started on IV fluids but was held because he developed right lower lobe crackles, elevated JVD.  Lactic acid was 2.9 on presentation.  He had mild grade fever.  Patient was started on IV antibiotics for possible community-acquired pneumonia.  He was started on Levophed for hypotension and was transferred to [facility] under [facility] service.  Patient transferred to [facility] on 08/06/2020.  On presentation, level also showed AKI with creatinine of 2.7, creatinine was normal on 6/5.  Currently he is hemodynamically stable.  Patient continues to complain of weakness.  Planning for PT/OT evaluation and continuing IV fluids for AKI.  Assessment & Plan:   Active Problems:   Hypotension  Hypotension BP stable Unclear etiology but suspected to be from sepsis versus dehydration. On presentation at [facility] ER, he had to be started on Levophed for maintenance of blood pressure, currently off Home medications still on hold  AKI Improving Creatinine in the range of 2.7 on presentation Most likely secondary to dehydration Continue IV fluids Daily BMP  Sepsis/community-acquired pneumonia Presented with low-grade fever, cough with a sputum S/P IV Azithromycin, ceftriaxone, switched to PO cefdinir  Patient states he has chronic cough most likely from COPD.  He is still a smoker and smokes a few  cigarettes a day.  History of COPD Currently not in exacerbation.  Continue home regimen on discharge.  He is a current smoker  GERD/achalasia:  On Protonix follows with gastroenterology as an outpatient.  Status post dilation on 03/09/2020.  Previous EGD on 1/22 showed benign-appearing esophageal stenosis versus motility disorder  in the distal esophagus.  Speech therapy evaluation done who recommended GI consultation for esophageal dysphagia [facility]  GI consulted, s/p EGD with botox injection on 08/08/20  Hyperlipidemia On Crestor 10 mg daily  BPH On Proscar at home  Depression/anxiety On Wellbutrin  Generalized weakness Pt advising home health PT  Thrombocytopenia Plts 90s - hold heparin - f/u smear, hcv, hiv - monitor    DVT prophylaxis: SCDs Code Status: Full Family Communication: Spoke to daughter at bedside on 08/08/20 Status is: Inpatient  Remains inpatient appropriate because:Unsafe d/c plan  Dispo: The patient is from: Home              Anticipated d/c is to: Home              Patient currently is not medically stable to d/c.   Difficult to place patient No     Consultants: PCCM, GI  Procedures: EGD with botox injection  Antimicrobials:  Anti-infectives (From admission, onward)    Start     Dose/Rate Route Frequency Ordered Stop   08/06/20 1045  cefdinir (OMNICEF) capsule 300 mg        300 mg Oral Every 12 hours 08/06/20 0951 08/11/20 0959   08/05/20 0500  cefTRIAXone (ROCEPHIN) 2 g in sodium chloride 0.9 % 100 mL IVPB  Status:  Discontinued  2 g 200 mL/hr over 30 Minutes Intravenous Daily 08/05/20 0406 08/06/20 0951   08/05/20 0500  azithromycin (ZITHROMAX) 500 mg in sodium chloride 0.9 % 250 mL IVPB  Status:  Discontinued        500 mg 250 mL/hr over 60 Minutes Intravenous Daily 08/05/20 0406 08/05/20 1704       Subjective:  Saw pt post EGD, denied any new complaints. Discussed with daughter at bedside.    Objective: Vitals:    08/08/20 0850 08/08/20 0900 08/08/20 0910 08/08/20 1004  BP: (!) 152/54 (!) 123/56 (!) 123/56 138/60  Pulse: 74 71 68 67  Resp: (!) 25 19 (!) 21 18  Temp: 99.2 F (37.3 C)   99.1 F (37.3 C)  TempSrc: Axillary   Oral  SpO2: 96% 97% 97%   Weight:      Height:        Intake/Output Summary (Last 24 hours) at 08/08/2020 1808 Last data filed at 08/08/2020 1500 Gross per 24 hour  Intake 1634.61 ml  Output 2850 ml  Net -1215.39 ml   Filed Weights   08/05/20 0236 08/06/20 0500 08/07/20 0500  Weight: 83.3 kg 83 kg 81.6 kg    Examination: General: NAD  Cardiovascular: S1, S2 present Respiratory: CTAB Abdomen: Soft, nontender, nondistended, bowel sounds present Musculoskeletal: No bilateral pedal edema noted Skin: Normal Psychiatry: Normal mood     Data Reviewed: I have personally reviewed following labs and imaging studies  CBC: Recent Labs  Lab 08/05/20 0441 08/06/20 0116 08/07/20 0332  WBC 11.6* 11.2* 9.4  NEUTROABS 10.0*  --  7.2  HGB 11.5* 10.8* 12.3*  HCT 34.4* 32.6* 36.5*  MCV 98.3 98.8 97.6  PLT PLATELET CLUMPS NOTED ON SMEAR, UNABLE TO ESTIMATE 93* 93*   Basic Metabolic Panel: Recent Labs  Lab 08/05/20 0441 08/06/20 0116 08/06/20 1015 08/07/20 1343 08/08/20 0307  NA 136 134* 136 133* 133*  K 4.5 3.6 4.1 4.5 4.2  CL 102 103 101 97* 99  CO2 24 24 28 25 28   GLUCOSE 113* 109* 109* 154* 111*  BUN 51* 47* 43* 32* 25*  CREATININE 2.71* 2.18* 2.01* 1.58* 1.59*  CALCIUM 8.8* 8.6* 8.8* 8.8* 8.5*  MG 2.1  --   --   --   --    GFR: Estimated Creatinine Clearance: 37.1 mL/min (A) (by C-G formula based on SCr of 1.59 mg/dL (H)). Liver Function Tests: Recent Labs  Lab 08/05/20 0441  AST 20  ALT 17  ALKPHOS 46  BILITOT 0.8  PROT 5.7*  ALBUMIN 2.4*   No results for input(s): LIPASE, AMYLASE in the last 168 hours.  No results for input(s): AMMONIA in the last 168 hours. Coagulation Profile: No results for input(s): INR, PROTIME in the last 168  hours. Cardiac Enzymes: No results for input(s): CKTOTAL, CKMB, CKMBINDEX, TROPONINI in the last 168 hours. BNP (last 3 results) No results for input(s): PROBNP in the last 8760 hours. HbA1C: No results for input(s): HGBA1C in the last 72 hours. CBG: Recent Labs  Lab 08/07/20 1120 08/07/20 1259 08/07/20 2017 08/08/20 0627  GLUCAP 56* 97 91 86   Lipid Profile: No results for input(s): CHOL, HDL, LDLCALC, TRIG, CHOLHDL, LDLDIRECT in the last 72 hours. Thyroid Function Tests: No results for input(s): TSH, T4TOTAL, FREET4, T3FREE, THYROIDAB in the last 72 hours. Anemia Panel: No results for input(s): VITAMINB12, FOLATE, FERRITIN, TIBC, IRON, RETICCTPCT in the last 72 hours. Sepsis Labs: Recent Labs  Lab 08/05/20 0441  PROCALCITON 13.45  LATICACIDVEN 2.0*  Recent Results (from the past 240 hour(s))  SARS CORONAVIRUS 2 (TAT 6-24 HRS) Nasopharyngeal Nasopharyngeal Swab     Status: None   Collection Time: 07/30/20  2:45 PM   Specimen: Nasopharyngeal Swab  Result Value Ref Range Status   SARS Coronavirus 2 NEGATIVE NEGATIVE Final    Comment: (NOTE) SARS-CoV-2 target nucleic acids are NOT DETECTED.  The SARS-CoV-2 RNA is generally detectable in upper and lower respiratory specimens during the acute phase of infection. Negative results do not preclude SARS-CoV-2 infection, do not rule out co-infections with other pathogens, and should not be used as the sole basis for treatment or other patient management decisions. Negative results must be combined with clinical observations, patient history, and epidemiological information. The expected result is Negative.  Fact Sheet for Patients: SugarRoll.be  Fact Sheet for Healthcare Providers: https://www.woods-mathews.com/  This test is not yet approved or cleared by the [place] FDA and  has been authorized for detection and/or diagnosis of SARS-CoV-2 by FDA under an Emergency Use  Authorization (EUA). This EUA will remain  in effect (meaning this test can be used) for the duration of the COVID-19 declaration under Se ction 564(b)(1) of the Act, 21 U.S.C. section 360bbb-3(b)(1), unless the authorization is terminated or revoked sooner.  Performed at [facility], [place] [address] [place], [place] [address]   Culture, blood (routine x 2)     Status: None (Preliminary result)   Collection Time: 08/05/20  4:30 AM   Specimen: BLOOD LEFT HAND  Result Value Ref Range Status   Specimen Description   Final    BLOOD LEFT HAND Performed at [facility], [address] [place], [place] [address]    Special Requests   Final    BOTTLES DRAWN AEROBIC AND ANAEROBIC Blood Culture adequate volume Performed at [facility], [address] [place], [place] [address]    Culture   Final    NO GROWTH 3 DAYS Performed at [facility], [place] [address], [place], [place] [address]    Report Status PENDING  Incomplete  Culture, blood (routine x 2)     Status: None (Preliminary result)   Collection Time: 08/05/20  4:50 AM   Specimen: BLOOD  Result Value Ref Range Status   Specimen Description   Final    BLOOD RIGHT ANTECUBITAL Performed at [facility], [address] [place], [place] [address]    Special Requests   Final    BOTTLES DRAWN AEROBIC AND ANAEROBIC Blood Culture adequate volume Performed at [facility], [address] [place], [place] [address]    Culture   Final    NO GROWTH 3 DAYS Performed at [facility], [place] [address] [place], [place] [address]    Report Status PENDING  Incomplete  MRSA Next Gen by PCR, Nasal     Status: None   Collection Time: 08/05/20  4:56 AM  Result Value Ref Range Status   MRSA by PCR Next Gen NOT DETECTED NOT DETECTED Final    Comment: (NOTE) The GeneXpert MRSA Assay (FDA approved for NASAL specimens only), is one component of a comprehensive MRSA colonization  surveillance program. It is not intended to diagnose MRSA infection nor to guide or monitor treatment for MRSA infections. Test performance is not FDA approved in patients less than 54 years old. Performed at [facility], [place] [address], [place],  [address]   Culture,  Urine     Status: None   Collection Time: 08/05/20  4:23 PM   Specimen: Urine, Random  Result Value Ref Range Status   Specimen Description URINE, RANDOM  Final   Special Requests NONE  Final   Culture   Final    NO GROWTH Performed at [facility], [address], [place], [place] [address]    Report Status 08/06/2020 FINAL  Final         Radiology Studies: DG ESOPHAGUS W SINGLE CM (SOL OR THIN BA)  Result Date: 08/07/2020 CLINICAL DATA:  Difficulty swallowing rule out stricture EXAM: ESOPHOGRAM/BARIUM SWALLOW TECHNIQUE: Single contrast examination was performed using thin barium and barium tablet. FLUOROSCOPY TIME:  Fluoroscopy Time:  1 minutes 48 seconds Radiation Exposure Index (if provided by the fluoroscopic device): Number of Acquired Spot Images: 6 COMPARISON:  None. FINDINGS: Esophageal dysmotility with moderate decreased esophageal peristalsis. No aspiration identified Mild to moderate narrowing of the distal esophagus at the GE junction. Smooth margins and benign appearance. No mass or ulceration Barium tablet did not pass through the stricture of the distal esophagus despite multiple attempts water and upright positioning. IMPRESSION: Benign-appearing stricture distal esophagus. Barium tablet did not pass. Moderate esophageal dysmotility. Electronically Signed   By: Franchot Gallo M.D.   On: 08/07/2020 14:16        Scheduled Meds:  cefdinir  300 mg Oral Q12H   Chlorhexidine Gluconate Cloth  6 each Topical Daily   guaiFENesin  200 mg Oral Q6H   melatonin  3 mg Oral QHS   nitroGLYCERIN  0.4 mg Sublingual TID AC   pantoprazole  40 mg Oral Daily   Continuous Infusions:  sodium  chloride Stopped (08/06/20 0749)   dextrose 5 % and 0.45% NaCl 100 mL/hr at 08/08/20 0155   promethazine (PHENERGAN) injection (IM or IVPB) 12.5 mg (08/07/20 2236)     LOS: 3 days     Alma Friendly, MD [facility] 08/08/2020, 6:08 PM

## 2020-08-08 NOTE — Transfer of Care (Signed)
Immediate Anesthesia Transfer of Care Note  Patient: [name] [name]  Procedure(s) Performed: ESOPHAGOGASTRODUODENOSCOPY (EGD) WITH PROPOFOL BOTOX INJECTION  Patient Location: PACU and Endoscopy Unit  Anesthesia Type:MAC  Level of Consciousness: awake and drowsy  Airway & Oxygen Therapy: Patient Spontanous Breathing and Patient connected to nasal cannula oxygen  Post-op Assessment: Report given to RN and Post -op Vital signs reviewed and stable  Post vital signs: Reviewed and stable  Last Vitals:  Vitals Value Taken Time  BP 152/54 08/08/20 0851  Temp    Pulse 62 08/08/20 0852  Resp 19 08/08/20 0852  SpO2 97 % 08/08/20 0852  Vitals shown include unvalidated device data.  Last Pain:  Vitals:   08/08/20 0724  TempSrc: Oral  PainSc: 0-No pain      Patients Stated Pain Goal: 0 (14/64/31 4276)  Complications: No notable events documented.

## 2020-08-08 NOTE — Anesthesia Postprocedure Evaluation (Signed)
Anesthesia Post Note  Patient: [name] [name]  Procedure(s) Performed: ESOPHAGOGASTRODUODENOSCOPY (EGD) WITH PROPOFOL BOTOX INJECTION     Patient location during evaluation: Endoscopy Anesthesia Type: MAC Level of consciousness: sedated and lethargic Pain management: pain level controlled Vital Signs Assessment: post-procedure vital signs reviewed and stable Respiratory status: spontaneous breathing Cardiovascular status: stable Postop Assessment: no apparent nausea or vomiting Anesthetic complications: no   No notable events documented.  Last Vitals:  Vitals:   08/08/20 0724 08/08/20 0850  BP: (!) 140/45 (!) 152/54  Pulse: 79 74  Resp: (!) 26 (!) 25  Temp: 36.6 C 37.3 C  SpO2: 97% 96%    Last Pain:  Vitals:   08/08/20 0850  TempSrc: Axillary  PainSc: [place] F Taaliyah Delpriore Jr

## 2020-08-08 NOTE — Op Note (Signed)
[facility] Patient Name: [name] [name] Procedure Date : 08/08/2020 MRN: [id number] Attending MD: Clarene Essex , MD Date of Birth: 12/05/1940 CSN: [id number] Age: 80 Admit Type: Inpatient Procedure:                Upper GI endoscopy Indications:              Dysphagia, Abnormal UGI series Providers:                Clarene Essex, MD, Kary Kos RN, RN, Ladona Ridgel, Technician Referring MD:              Medicines:                Propofol total dose 120 mg IV,60mg  IV lidocaine Complications:            No immediate complications. Estimated Blood Loss:     Estimated blood loss: none. Procedure:                Pre-Anesthesia Assessment:                           - Prior to the procedure, a History and Physical                            was performed, and patient medications and                            allergies were reviewed. The patient's tolerance of                            previous anesthesia was also reviewed. The risks                            and benefits of the procedure and the sedation                            options and risks were discussed with the patient.                            All questions were answered, and informed consent                            was obtained. Prior Anticoagulants: The patient has                            taken no previous anticoagulant or antiplatelet                            agents except for aspirin. ASA Grade Assessment:                            III - A patient with severe systemic disease. After  reviewing the risks and benefits, the patient was                            deemed in satisfactory condition to undergo the                            procedure.                           After obtaining informed consent, the endoscope was                            passed under direct vision. Throughout the                            procedure, the patient's blood  pressure, pulse, and                            oxygen saturations were monitored continuously. The                            [id number]-H190 ([id number]) Olympus gastroscope was                            introduced through the mouth, and advanced to the                            third part of duodenum. The upper GI endoscopy was                            accomplished without difficulty. The patient                            tolerated the procedure well. Scope In: Scope Out: Findings:      The larynx was normal.      A hypertonic lower esophageal sphincter was found. There was mild       resistance to endoscope advancement into the stomach. The Z-line was       regular. The gastroesophageal junction and cardia were normal on       retroflexed view. Area was successfully injected with 100 units       botulinum toxin.      The entire examined stomach was normal.      The duodenal bulb, first portion of the duodenum, second portion of the       duodenum and third portion of the duodenum were normal.      The exam was otherwise without abnormality. Impression:               - Normal larynx.                           - The examination was suspicious for achalasia.                            Injected with botulinum toxin.                           -  Normal stomach.                           - Normal duodenal bulb, first portion of the                            duodenum, second portion of the duodenum and third                            portion of the duodenum.                           - The examination was otherwise normal.                           - No specimens collected. Recommendation:           - Soft diet today.                           - Continue present medications.                           - Return to GI clinic in 4 weeks.                           - Telephone GI clinic if symptomatic PRN. Procedure Code(s):        --- Professional ---                           [phone],  Esophagogastroduodenoscopy, flexible,                            transoral; with directed submucosal injection(s),                            any substance Diagnosis Code(s):        --- Professional ---                           R13.10, Dysphagia, unspecified                           R93.3, Abnormal findings on diagnostic imaging of                            other parts of digestive tract CPT copyright 2019 American [employer] Association. All rights reserved. The codes documented in this report are preliminary and upon coder review may  be revised to meet current compliance requirements. Clarene Essex, MD 08/08/2020 8:53:29 AM This report has been signed electronically. Number of Addenda: 0

## 2020-08-08 NOTE — Progress Notes (Signed)
[name] [name] 7:59 AM  Subjective: Patient seen and examined and his case discussed with my partner Dr. Cristina Gong and currently doing well and we discussed his swallowing issues and reviewed his barium swallow and discussed his endoscopy with probable Botox and he has no new complaints  Objective: Signs stable afebrile exam please see preassessment evaluation BUN slight improved creatinine about the same swallow reviewed  Assessment: Dysphagia probably motility disorder  Plan: Okay for repeat endoscopy and probable Botox with anesthesia assistance  [facility]  office [phone] After 5PM or if no answer call [phone]

## 2020-08-08 NOTE — Anesthesia Procedure Notes (Signed)
Procedure Name: MAC Date/Time: 08/08/2020 8:21 AM Performed by: Trinna Post., CRNA Pre-anesthesia Checklist: Patient identified, Emergency Drugs available, Suction available, Patient being monitored and Timeout performed Patient Re-evaluated:Patient Re-evaluated prior to induction Oxygen Delivery Method: Nasal cannula Preoxygenation: Pre-oxygenation with 100% oxygen Induction Type: IV induction Placement Confirmation: positive ETCO2

## 2020-08-09 DIAGNOSIS — R531 Weakness: Secondary | ICD-10-CM

## 2020-08-09 LAB — CBC WITH DIFFERENTIAL/PLATELET
Abs Immature Granulocytes: 0.14 10*3/uL — ABNORMAL HIGH (ref 0.00–0.07)
Basophils Absolute: 0 10*3/uL (ref 0.0–0.1)
Basophils Relative: 0 %
Eosinophils Absolute: 0 10*3/uL (ref 0.0–0.5)
Eosinophils Relative: 1 %
HCT: 34.3 % — ABNORMAL LOW (ref 39.0–52.0)
Hemoglobin: 11.6 g/dL — ABNORMAL LOW (ref 13.0–17.0)
Immature Granulocytes: 2 %
Lymphocytes Relative: 15 %
Lymphs Abs: 1 10*3/uL (ref 0.7–4.0)
MCH: 32.7 pg (ref 26.0–34.0)
MCHC: 33.8 g/dL (ref 30.0–36.0)
MCV: 96.6 fL (ref 80.0–100.0)
Monocytes Absolute: 1 10*3/uL (ref 0.1–1.0)
Monocytes Relative: 14 %
Neutro Abs: 4.9 10*3/uL (ref 1.7–7.7)
Neutrophils Relative %: 68 %
Platelets: 111 10*3/uL — ABNORMAL LOW (ref 150–400)
RBC: 3.55 MIL/uL — ABNORMAL LOW (ref 4.22–5.81)
RDW: 13 % (ref 11.5–15.5)
WBC: 7.1 10*3/uL (ref 4.0–10.5)
nRBC: 0 % (ref 0.0–0.2)

## 2020-08-09 LAB — BASIC METABOLIC PANEL
Anion gap: 8 (ref 5–15)
BUN: 20 mg/dL (ref 8–23)
CO2: 27 mmol/L (ref 22–32)
Calcium: 8.2 mg/dL — ABNORMAL LOW (ref 8.9–10.3)
Chloride: 100 mmol/L (ref 98–111)
Creatinine, Ser: 1.46 mg/dL — ABNORMAL HIGH (ref 0.61–1.24)
GFR, Estimated: 48 mL/min — ABNORMAL LOW (ref >60)
Glucose, Bld: 122 mg/dL — ABNORMAL HIGH (ref 70–99)
Potassium: 3.4 mmol/L — ABNORMAL LOW (ref 3.5–5.1)
Sodium: 135 mmol/L (ref 135–145)

## 2020-08-09 LAB — PATHOLOGIST SMEAR REVIEW

## 2020-08-09 MED ORDER — CEFDINIR 300 MG PO CAPS: 300.0000 mg | ORAL_CAPSULE | Freq: Two times a day (BID) | ORAL | 0 refills | Status: AC

## 2020-08-09 MED ORDER — POTASSIUM CHLORIDE CRYS ER 20 MEQ PO TBCR
40.0000 meq | EXTENDED_RELEASE_TABLET | Freq: Once | ORAL | Status: AC
  Administered 2020-08-09: 40 meq via ORAL
  Filled 2020-08-09: qty 2

## 2020-08-09 NOTE — Progress Notes (Signed)
Physical Therapy Treatment Patient Details Name: [name] [name] MRN: [id number] DOB: 07-21-1940 Today's Date: 08/09/2020    History of Present Illness 80 yo admitted 6/11 as transfer from [facility] with SOB, CAP and hypotension. S/P upper endoscopy 6/14. PMHx: CAD, COPD, OSA, esophageal dysfunction, anxiety, BPH    PT Comments    Pt received in recliner. Very groggy, reporting need to take nap. Pt required min guard assist transfers, min guard assist ambulation 20' with RW, and supervision bed mobility. Pt declined hallway ambulation due to lethargy and dizziness. Pt in bed at end of session.    Follow Up Recommendations  Home health PT;Supervision for mobility/OOB     Equipment Recommendations  3in1 (PT)    Recommendations for Other Services       Precautions / Restrictions Precautions Precautions: Fall Restrictions Weight Bearing Restrictions: No    Mobility  Bed Mobility Overal bed mobility: Needs Assistance Bed Mobility: Sit to Supine     Supine to sit: Supervision Sit to supine: Supervision;HOB elevated   General bed mobility comments: increased time, cues for sequencing    Transfers Overall transfer level: Needs assistance Equipment used: Rolling walker (2 wheeled) Transfers: Sit to/from Stand Sit to Stand: Min guard         General transfer comment: cues for hand placement and sequencing  Ambulation/Gait Ambulation/Gait assistance: Min guard Gait Distance (Feet): 20 Feet Assistive device: Rolling walker (2 wheeled) Gait Pattern/deviations: Step-through pattern;Decreased stride length;Trunk flexed Gait velocity: decreased Gait velocity interpretation: <1.8 ft/sec, indicate of risk for recurrent falls General Gait Details: declining hallway ambulation due to dizziness   Stairs             Wheelchair Mobility    Modified Rankin (Stroke Patients Only)       Balance Overall balance assessment: Needs assistance Sitting-balance  support: No upper extremity supported;Feet supported Sitting balance-Leahy Scale: Fair Sitting balance - Comments: supervision dynamically   Standing balance support: Bilateral upper extremity supported;During functional activity Standing balance-Leahy Scale: Poor Standing balance comment: reliant on external support                            Cognition Arousal/Alertness: Awake/alert Behavior During Therapy: WFL for tasks assessed/performed Overall Cognitive Status: Within Functional Limits for tasks assessed                                        Exercises      General Comments General comments (skin integrity, edema, etc.): VSS      Pertinent Vitals/Pain Pain Assessment: No/denies pain    Home Living                      Prior Function            PT Goals (current goals can now be found in the care plan section) Acute Rehab PT Goals Patient Stated Goal: return home Progress towards PT goals: Progressing toward goals    Frequency    Min 3X/week      PT Plan Current plan remains appropriate    Co-evaluation              AM-PAC PT "6 Clicks" Mobility   Outcome Measure  Help needed turning from your back to your side while in a flat bed without using bedrails?: A Little Help needed moving  from lying on your back to sitting on the side of a flat bed without using bedrails?: A Little Help needed moving to and from a bed to a chair (including a wheelchair)?: A Little Help needed standing up from a chair using your arms (e.g., wheelchair or bedside chair)?: A Little Help needed to walk in hospital room?: A Little Help needed climbing 3-5 steps with a railing? : A Lot 6 Click Score: 17    End of Session Equipment Utilized During Treatment: Gait belt Activity Tolerance: Patient limited by lethargy;Treatment limited secondary to medical complications (Comment) (dizziness) Patient left: in bed;with call bell/phone within  reach;with bed alarm set Nurse Communication: Mobility status PT Visit Diagnosis: Other abnormalities of gait and mobility (R26.89);Difficulty in walking, not elsewhere classified (R26.2);Muscle weakness (generalized) (M62.81)     Time: 2241-1464 PT Time Calculation (min) (ACUTE ONLY): 13 min  Charges:  $Gait Training: 8-22 mins                     Lorrin Goodell, [place]  Office # [phone] Pager [place], [place] 08/09/2020, 11:30 AM

## 2020-08-09 NOTE — Progress Notes (Signed)
Occupational Therapy Treatment Patient Details Name: [name] [name] MRN: [id number] DOB: 18-Sep-1940 Today's Date: 08/09/2020    History of present illness 80 yo admitted 6/11 as transfer from [facility] with SOB, CAP and hypotension. S/P upper endoscopy 6/14. PMHx: CAD, COPD, OSA, esophageal dysfunction, anxiety, BPH   OT comments  Pt supine in bed and agreeable to OT, reports fatigued today but wanting to get OOB.  Pt completing LB dressing at EOB with min guard assist, functional mobility using RW and transfers using RW with min guard.  Pt seated in recliner to engage in oral care of dentures.  Fatigues easily and requires increased time with all tasks. Will follow acutely, DC plan remains appropriate at [facility].     Follow Up Recommendations  Home health OT;Supervision - Intermittent    Equipment Recommendations  None recommended by OT    Recommendations for Other Services      Precautions / Restrictions Precautions Precautions: Fall Restrictions Weight Bearing Restrictions: No       Mobility Bed Mobility Overal bed mobility: Needs Assistance Bed Mobility: Supine to Sit     Supine to sit: Supervision     General bed mobility comments: increased time and cueing for use of rail but no physical assist required    Transfers Overall transfer level: Needs assistance Equipment used: Rolling walker (2 wheeled) Transfers: Sit to/from Stand Sit to Stand: Min guard         General transfer comment: min guard to steady from EOB    Balance Overall balance assessment: Needs assistance Sitting-balance support: No upper extremity supported;Feet supported Sitting balance-Leahy Scale: Fair Sitting balance - Comments: supervision dynamically   Standing balance support: Bilateral upper extremity supported;During functional activity Standing balance-Leahy Scale: Poor Standing balance comment: relies on BUE Support                           ADL either performed  or assessed with clinical judgement   ADL Overall ADL's : Needs assistance/impaired     Grooming: Set up;Sitting               Lower Body Dressing: Min guard;Sit to/from stand Lower Body Dressing Details (indicate cue type and reason): able to change socks in sitting, sit to stand min guard Toilet Transfer: Min guard;Ambulation;RW Toilet Transfer Details (indicate cue type and reason): simulated to recliner         Functional mobility during ADLs: Min guard;Rolling walker General ADL Comments: pt limited by decreased activity tolerance and endurance     Vision       Perception     Praxis      Cognition Arousal/Alertness: Awake/alert Behavior During Therapy: WFL for tasks assessed/performed Overall Cognitive Status: Within Functional Limits for tasks assessed                                          Exercises     Shoulder Instructions       General Comments VSS    Pertinent Vitals/ Pain       Pain Assessment: No/denies pain  Home Living                                          Prior Functioning/Environment  Frequency  Min 2X/week        Progress Toward Goals  OT Goals(current goals can now be found in the care plan section)  Progress towards OT goals: Progressing toward goals  Acute Rehab OT Goals Patient Stated Goal: return home OT Goal Formulation: With patient  Plan Discharge plan remains appropriate;Frequency remains appropriate    Co-evaluation                 AM-PAC OT "6 Clicks" Daily Activity     Outcome Measure   Help from another person eating meals?: None Help from another person taking care of personal grooming?: A Little Help from another person toileting, which includes using toliet, bedpan, or urinal?: A Little Help from another person bathing (including washing, rinsing, drying)?: A Little Help from another person to put on and taking off regular upper body  clothing?: A Little Help from another person to put on and taking off regular lower body clothing?: A Little 6 Click Score: 19    End of Session Equipment Utilized During Treatment: Rolling walker;Gait belt  OT Visit Diagnosis: Other abnormalities of gait and mobility (R26.89);Muscle weakness (generalized) (M62.81)   Activity Tolerance Patient tolerated treatment well   Patient Left in chair;with call bell/phone within reach;with chair alarm set   Nurse Communication Mobility status        Time: 6144-3154 OT Time Calculation (min): 29 min  Charges: OT General Charges $OT Visit: 1 Visit OT Treatments $Self Care/Home Management : 23-37 mins  [place] Pager [phone] Office [phone]    Delight Stare 08/09/2020, 10:28 AM
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
## 2020-08-09 NOTE — Care Management Important Message (Signed)
Important Message  Patient Details  Name: [name] [name] MRN: [id number] Date of Birth: 05/04/1940   Medicare Important Message Given:  Yes     Orbie Pyo 08/09/2020, 2:14 PM

## 2020-08-09 NOTE — Progress Notes (Signed)
The patient is doing fine following yesterday's endoscopy with Botox injection in the region of the lower esophageal sphincter.  He had a small amount of soft food this morning.  He thinks his swallowing is a little bit better.  Plan:  1.  We will sign off.  Please call us if we can be of further assistance in this patient's care.  2.  I have made a follow-up appointment for the patient to see Dr. Watt Climes on August 22nd at 3:15 PM to assess his response to the Botox injection.  The patient is aware of this appointment.  Cleotis Nipper, M.D. Pager [phone] If no answer or after 5 PM call [phone]

## 2020-08-09 NOTE — Discharge Summary (Signed)
Discharge Summary  [name] [name] DVV:[id number] DOB: 12/01/1940  PCP: [name], MD  Admit date: 08/05/2020 Discharge date: 08/09/2020  Time spent: 40 mins  Recommendations for Outpatient Follow-up:  PCP in 1 week GI as scheduled Cardiology as scheduled    Discharge Diagnoses:  Active Hospital Problems   Diagnosis Date Noted   Hypotension 08/05/2020    Resolved Hospital Problems  No resolved problems to display.    Discharge Condition: Stable  Diet recommendation: Soft diet  Vitals:   08/09/20 0618 08/09/20 0925  BP: 136/61 (!) 104/54  Pulse: 67 75  Resp: 16 17  Temp: 98.2 F (36.8 C) 98.3 F (36.8 C)  SpO2: 95% 95%    History of present illness:  Patient is a 80 year old male with history of coronary artery disease, COPD, degenerative disease, OSA, anxiety, BPH who presented to [facility] ER for shortness of breath, cough with whitish sputum, fatigue, vomiting, confusion.  He was recently admitted here from 6/5-6/6 for work-up of syncope showed, work-up was benign and was discharged.  As per the daughter, patient has had some chills along with cough.  On presentation at [facility] ER, he was hypotensive so he was started on IV fluids but was held because he developed right lower lobe crackles, elevated JVD.  Lactic acid was 2.9 on presentation.  He had mild grade fever.  Patient was started on IV antibiotics for possible community-acquired pneumonia.  He was started on Levophed for hypotension and was transferred to [facility] under [facility] service.  Patient transferred to [facility] on 08/06/2020.  On presentation, labs also showed AKI with creatinine of 2.7, creatinine was normal on 6/5.  Currently he is hemodynamically stable.  Patient continues to complain of weakness. PT/OT rec HH PT/OT.    Today, pt reports no difficulty in swallowing, denies any N/V, abdominal pain, chest pain. Still with generalized fatigue. HH PT/OT arranged. Discussed discharge plans with  daughter.   Hospital Course:  Active Problems:   Hypotension   Hypotension BP stable Unclear etiology but suspected to be from sepsis versus dehydration. On presentation at [facility] ER, he had to be started on Levophed for maintenance of blood pressure, currently off Restart home medications once SBP>120 (discussed with daughter as she preps his med box and checks his BP)   AKI Improving Creatinine in the range of 2.7 on presentation Most likely secondary to dehydration S/P IV fluids Follow up with PCP with repeat labs   Sepsis/community-acquired pneumonia Presented with low-grade fever, productive cough with sputum S/P IV Azithromycin, ceftriaxone, switched to PO cefdinir to complete 5 days Patient states he has chronic cough most likely from COPD.  He is still a smoker and smokes a few cigarettes a day   History of COPD Currently not in exacerbation.  Continue home regimen on discharge   GERD/achalasia/dysphagia On Protonix follows with gastroenterology as an outpatient.  Status post dilation on 03/09/2020.  Previous EGD on 1/22 showed benign-appearing esophageal stenosis versus motility disorder  in the distal esophagus.  Speech therapy evaluation done who recommended GI consultation for esophageal dysphagia [facility]  GI consulted, s/p EGD with botox injection on 08/08/20   Hyperlipidemia On Crestor 10 mg daily   BPH On Proscar at home   Depression/anxiety On Wellbutrin   Generalized weakness Home health PT/OT   Thrombocytopenia Improving Follow up with PCP with repeat labs  Tobacco abuse Advised to quit      Malnutrition Type:  Nutrition Problem: Inadequate oral intake Etiology: inability to eat  Malnutrition Characteristics:  Dysphagia   Nutrition Interventions:  Interventions: MVI, Refer to RD note for recommendations   Estimated body mass index is 26.57 kg/m as calculated from the following:   Height as of this encounter: 5\' 9"  (1.753 m).    Weight as of this encounter: 81.6 kg.    Procedures: EGD with botox injection on 08/08/20  Consultations: PCCM GI  Discharge Exam: BP (!) 104/54 (BP Location: Left Arm)   Pulse 75   Temp 98.3 F (36.8 C)   Resp 17   Ht 5\' 9"  (1.753 m)   Wt 81.6 kg   SpO2 95%   BMI 26.57 kg/m   General: NAD  Cardiovascular: S1, S2 present Respiratory: CTAB Abdomen: Soft, nontender, nondistended, bowel sounds present Musculoskeletal: No bilateral pedal edema noted Skin: Normal Psychiatry: Normal mood    Discharge Instructions You were cared for by a hospitalist during your hospital stay. If you have any questions about your discharge medications or the care you received while you were in the hospital after you are discharged, you can call the unit and asked to speak with the hospitalist on call if the hospitalist that took care of you is not available. Once you are discharged, your primary care physician will handle any further medical issues. Please note that NO REFILLS for any discharge medications will be authorized once you are discharged, as it is imperative that you return to your primary care physician (or establish a relationship with a primary care physician if you do not have one) for your aftercare needs so that they can reassess your need for medications and monitor your lab values.  Discharge Instructions     Diet - low sodium heart healthy   Complete by: As directed    Increase activity slowly   Complete by: As directed       Allergies as of 08/09/2020       Reactions   Doxycycline Other (See Comments)   Dizziness   Flomax [tamsulosin] Other (See Comments)   Dizziness   Lipitor [atorvastatin] Other (See Comments)   Muscle weakness   Rhubarb Other (See Comments)   From childhood- exact reaction not recalled, but is allergic   Sulfa Antibiotics Hives   Sulfacetamide Sodium Hives   Clindamycin/lincomycin Rash   Other Hives, Itching, Rash, Other (See Comments)   UNNAMED  ANTIBIOTIC- SEVERE SYMPTOMS [place]- From childhood- exact reaction not recalled, but is allergic   Vytorin [ezetimibe-simvastatin] Other (See Comments)   Bodyaches        Medication List     TAKE these medications    aspirin EC 81 MG tablet Take 1 tablet (81 mg total) by mouth daily. Resume after 4 days. What changed:  when to take this additional instructions   buPROPion 100 MG tablet Commonly known as: WELLBUTRIN Take 200 mg by mouth at bedtime.   cefdinir 300 MG capsule Commonly known as: OMNICEF Take 1 capsule (300 mg total) by mouth every 12 (twelve) hours for 1 day. Start taking on: August 10, 2020   donepezil 5 MG tablet Commonly known as: ARICEPT Take 5 mg by mouth at bedtime.   finasteride 5 MG tablet Commonly known as: PROSCAR Take 5 mg by mouth at bedtime.   HYDROcodone-acetaminophen 5-325 MG tablet Commonly known as: NORCO/VICODIN Take 1 tablet by mouth every 6 (six) hours as needed for severe pain.   isosorbide mononitrate 60 MG 24 hr tablet Commonly known as: IMDUR Take 1.5 tablets (90 mg total) by mouth daily.  Please keep upcoming appt in June 2022 with Cardiologist before anymore refills. Thank you What changed:  when to take this additional instructions   metoprolol tartrate 25 MG tablet Commonly known as: LOPRESSOR Take 0.5 tablets (12.5 mg total) by mouth 2 (two) times daily.   multivitamin with minerals Tabs tablet Take 1 tablet by mouth daily.   nitroGLYCERIN 0.4 MG SL tablet Commonly known as: NITROSTAT Place 0.4 mg under the tongue See admin instructions. Dissolve 0.4 mg sublingually 10 minutes before each meal for esophageal spasms and as needed for chest pain   ondansetron 4 MG tablet Commonly known as: ZOFRAN Take 4 mg by mouth every 8 (eight) hours as needed for nausea or vomiting.   pantoprazole 40 MG tablet Commonly known as: PROTONIX Take 40 mg by mouth 2 (two) times daily with a meal.   polyethylene glycol powder 17  GM/SCOOP powder Commonly known as: GLYCOLAX/MIRALAX Take 17 g by mouth daily as needed for mild constipation (MIX AND DRINK).   ranolazine 1000 MG SR tablet Commonly known as: Ranexa Take 1 tablet (1,000 mg total) by mouth 2 (two) times daily.   rosuvastatin 10 MG tablet Commonly known as: CRESTOR Take 10 mg by mouth every morning.   traMADol 50 MG tablet Commonly known as: Ultram Take 1 tablet (50 mg total) by mouth every 6 (six) hours as needed. What changed:  when to take this reasons to take this   vitamin B-12 1000 MCG tablet Commonly known as: CYANOCOBALAMIN Take 1,000 mcg by mouth every morning.   Vitamin D3 50 MCG (2000 UT) Tabs Take 2,000 Units by mouth every morning.       Allergies  Allergen Reactions   Doxycycline Other (See Comments)    Dizziness   Flomax [Tamsulosin] Other (See Comments)    Dizziness    Lipitor [Atorvastatin] Other (See Comments)    Muscle weakness   Rhubarb Other (See Comments)    From childhood- exact reaction not recalled, but is allergic   Sulfa Antibiotics Hives   Sulfacetamide Sodium Hives   Clindamycin/Lincomycin Rash   Other Hives, Itching, Rash and Other (See Comments)    UNNAMED ANTIBIOTIC- SEVERE SYMPTOMS  [place]- From childhood- exact reaction not recalled, but is allergic   Vytorin [Ezetimibe-Simvastatin] Other (See Comments)    Bodyaches    Follow-up Information     Clarene Essex, MD. Go on 10/16/2020.   Specialty: Gastroenterology Why: 10/16/20 at 3:15 pm Contact information: [address] [place] [address] [phone]         [name], MD. Schedule an appointment as soon as possible for a visit in 1 week(s).   Specialty: Internal Medicine Contact information: [address] [place] [address] [phone]         Belva Crome, MD .   Specialty: Cardiology Contact information: [phone] [address] [place] [address] [phone]                   The results of significant diagnostics from this hospitalization (including imaging, microbiology, ancillary and laboratory) are listed below for reference.    Significant Diagnostic Studies: DG Chest 2 View  Result Date: 07/30/2020 CLINICAL DATA:  Chest pain with nausea and vomiting EXAM: CHEST - 2 VIEW COMPARISON:  March 08, 2020 FINDINGS: Lungs are clear. Heart size and pulmonary vascularity are normal. No adenopathy. There is aortic atherosclerosis. There is degenerative change in the thoracic spine. No pneumothorax. IMPRESSION: Lungs  clear. Heart size normal. Aortic Atherosclerosis (ICD10-I70.0). Electronically Signed   By: Lowella Grip III M.D.   On: 07/30/2020 13:40   DG Chest Port 1 View  Result Date: 08/05/2020 CLINICAL DATA:  Short of breath EXAM: PORTABLE CHEST 1 VIEW COMPARISON:  CT scan of the chest and chest x-ray 07/30/2020 FINDINGS: Cardiac and mediastinal contours are within normal limits. Atherosclerotic calcifications present along the transverse aorta. Stable appearance of the lungs with mild bronchitic changes. No focal airspace infiltrate, pleural effusion, pneumothorax or other acute process. No acute fracture or malalignment. IMPRESSION: Stable chest x-ray without evidence of acute cardiopulmonary process. Electronically Signed   By: Jacqulynn Cadet M.D.   On: 08/05/2020 10:06   ECHOCARDIOGRAM COMPLETE  Result Date: 08/06/2020    ECHOCARDIOGRAM REPORT   Patient Name:   [name] [name] Date of Exam: 08/06/2020 Medical Rec #:  [id number]    Height:       69.0 in Accession #:    [id number]   Weight:       183.0 lb Date of Birth:  [birth year]    BSA:          1.989 m Patient Age:    44 years     BP:           128/74 mmHg Patient Gender: M            HR:           69 bpm. Exam Location:  Inpatient Procedure: 2D Echo, Cardiac Doppler, Color Doppler and Intracardiac            Opacification Agent Indications:    Hypotension  History:        Patient has prior  history of Echocardiogram examinations, most                 recent 07/31/2020. COPD; Risk Factors:Current Smoker, Hypertension                 and Dyslipidemia.  Sonographer:    Clayton Lefort RDCS (AE) Referring Phys: [id number] Arnell Asal  Sonographer Comments: Technically difficult study due to poor echo windows. Image acquisition challenging due to COPD and Image acquisition challenging due to respiratory motion. IMPRESSIONS  1. Left ventricular ejection fraction, by estimation, is 55 to 60%. The left ventricle has normal function. The left ventricle has no regional wall motion abnormalities. Left ventricular diastolic parameters are consistent with Grade I diastolic dysfunction (impaired relaxation).  2. Right ventricular systolic function is normal. The right ventricular size is normal. There is normal pulmonary artery systolic pressure. The estimated right ventricular systolic pressure is 85.2 mmHg.  3. The mitral valve is normal in structure. No evidence of mitral valve regurgitation. No evidence of mitral stenosis.  4. The aortic valve has an indeterminant number of cusps. Aortic valve regurgitation is not visualized. Mild aortic valve sclerosis is present, with no evidence of aortic valve stenosis.  5. The inferior vena cava is dilated in size with <50% respiratory variability, suggesting right atrial pressure of 15 mmHg. FINDINGS  Left Ventricle: Left ventricular ejection fraction, by estimation, is 55 to 60%. The left ventricle has normal function. The left ventricle has no regional wall motion abnormalities. Definity contrast agent was given IV to delineate the left ventricular  endocardial borders. The left ventricular internal cavity size was normal in size. There is no left ventricular hypertrophy. Left ventricular diastolic parameters are consistent with Grade I diastolic dysfunction (impaired relaxation). Normal left ventricular filling pressure.  Right Ventricle: The right ventricular size is normal. No  increase in right ventricular wall thickness. Right ventricular systolic function is normal. There is normal pulmonary artery systolic pressure. The tricuspid regurgitant velocity is 1.48 m/s, and  with an assumed right atrial pressure of 15 mmHg, the estimated right ventricular systolic pressure is 55.7 mmHg. Left Atrium: Left atrial size was normal in size. Right Atrium: Right atrial size was normal in size. Pericardium: There is no evidence of pericardial effusion. Mitral Valve: The mitral valve is normal in structure. No evidence of mitral valve regurgitation. No evidence of mitral valve stenosis. MV peak gradient, 3.7 mmHg. The mean mitral valve gradient is 1.0 mmHg. Tricuspid Valve: The tricuspid valve is normal in structure. Tricuspid valve regurgitation is not demonstrated. No evidence of tricuspid stenosis. Aortic Valve: The aortic valve has an indeterminant number of cusps. Aortic valve regurgitation is not visualized. Mild aortic valve sclerosis is present, with no evidence of aortic valve stenosis. Aortic valve mean gradient measures 7.0 mmHg. Aortic valve peak gradient measures 13.2 mmHg. Aortic valve area, by VTI measures 1.33 cm. Pulmonic Valve: The pulmonic valve was normal in structure. Pulmonic valve regurgitation is not visualized. No evidence of pulmonic stenosis. Aorta: The aortic root is normal in size and structure. Venous: The inferior vena cava is dilated in size with less than 50% respiratory variability, suggesting right atrial pressure of 15 mmHg. IAS/Shunts: The interatrial septum appears to be lipomatous. No atrial level shunt detected by color flow Doppler.  LEFT VENTRICLE PLAX 2D LVIDd:         5.40 cm  Diastology LVIDs:         4.00 cm  LV e' medial:    6.31 cm/s LV PW:         0.90 cm  LV E/e' medial:  10.5 LV IVS:        0.70 cm  LV e' lateral:   6.96 cm/s LVOT diam:     2.00 cm  LV E/e' lateral: 9.5 LV SV:         53 LV SV Index:   27 LVOT Area:     3.14 cm  RIGHT VENTRICLE              IVC RV Basal diam:  3.20 cm     IVC diam: 2.20 cm RV S prime:     11.70 cm/s TAPSE (M-mode): 2.8 cm LEFT ATRIUM           Index       RIGHT ATRIUM           Index LA diam:      3.70 cm 1.86 cm/m  RA Area:     14.60 cm LA Vol (A4C): 48.4 ml 24.33 ml/m RA Volume:   36.20 ml  18.20 ml/m  AORTIC VALVE AV Area (Vmax):    1.42 cm AV Area (Vmean):   1.57 cm AV Area (VTI):     1.33 cm AV Vmax:           182.00 cm/s AV Vmean:          117.000 cm/s AV VTI:            0.403 m AV Peak Grad:      13.2 mmHg AV Mean Grad:      7.0 mmHg LVOT Vmax:         82.20 cm/s LVOT Vmean:        58.300 cm/s LVOT VTI:  0.170 m LVOT/AV VTI ratio: 0.42  AORTA Ao Root diam: 3.20 cm MITRAL VALVE               TRICUSPID VALVE MV Area (PHT): 3.37 cm    TR Peak grad:   8.8 mmHg MV Area VTI:   1.64 cm    TR Vmax:        148.00 cm/s MV Peak grad:  3.7 mmHg MV Mean grad:  1.0 mmHg    SHUNTS MV Vmax:       0.97 m/s    Systemic VTI:  0.17 m MV Vmean:      57.9 cm/s   Systemic Diam: 2.00 cm MV Decel Time: 225 msec MV E velocity: 66.40 cm/s MV A velocity: 84.40 cm/s MV E/A ratio:  0.79 Fransico Him MD Electronically signed by Fransico Him MD Signature Date/Time: 08/06/2020/10:29:07 AM    Final    ECHOCARDIOGRAM COMPLETE  Result Date: 07/31/2020    ECHOCARDIOGRAM REPORT   Patient Name:   [name] [name] Date of Exam: 07/31/2020 Medical Rec #:  [id number]    Height:       69.0 in Accession #:    [id number]   Weight:       178.0 lb Date of Birth:  [birth year]    BSA:          1.966 m Patient Age:    70 years     BP:           148/68 mmHg Patient Gender: M            HR:           58 bpm. Exam Location:  Inpatient Procedure: 2D Echo, Cardiac Doppler and Color Doppler Indications:    Chest pain  History:        Patient has prior history of Echocardiogram examinations, most                 recent 11/20/2019. COPD; Risk Factors:Current Smoker,                 Hypertension and Dyslipidemia.  Sonographer:    Cammy Brochure Referring Phys: [phone]  [facility] Julious Oka  Sonographer Comments: Suboptimal apical window. Image acquisition challenging due to patient body habitus and Image acquisition challenging due to COPD. IMPRESSIONS  1. Left ventricular ejection fraction, by estimation, is 50 to 55%. The left ventricle has low normal function. The left ventricle has no regional wall motion abnormalities. There is mild left ventricular hypertrophy. Left ventricular diastolic parameters are indeterminate.  2. Right ventricular systolic function is normal. The right ventricular size is normal. Tricuspid regurgitation signal is inadequate for assessing PA pressure.  3. The mitral valve is grossly normal. No evidence of mitral valve regurgitation. No evidence of mitral stenosis.  4. The aortic valve is grossly normal. There is mild calcification of the aortic valve. There is mild thickening of the aortic valve. Aortic valve regurgitation is not visualized. Mild aortic valve sclerosis is present, with no evidence of aortic valve stenosis.  5. The inferior vena cava is normal in size with greater than 50% respiratory variability, suggesting right atrial pressure of 3 mmHg. Comparison(s): No significant change from prior study. Conclusion(s)/Recommendation(s): Otherwise normal echocardiogram, with minor abnormalities described in the report. FINDINGS  Left Ventricle: Left ventricular ejection fraction, by estimation, is 50 to 55%. The left ventricle has low normal function. The left ventricle has no regional wall motion abnormalities. Definity contrast agent was given IV  to delineate the left ventricular endocardial borders. The left ventricular internal cavity size was normal in size. There is mild left ventricular hypertrophy. Left ventricular diastolic parameters are indeterminate. Right Ventricle: The right ventricular size is normal. Right vetricular wall thickness was not well visualized. Right ventricular systolic function is normal. Tricuspid regurgitation signal  is inadequate for assessing PA pressure. Left Atrium: Left atrial size was not well visualized. Right Atrium: Right atrial size was not well visualized. Pericardium: There is no evidence of pericardial effusion. Presence of pericardial fat pad. Mitral Valve: The mitral valve is grossly normal. No evidence of mitral valve regurgitation. No evidence of mitral valve stenosis. Tricuspid Valve: The tricuspid valve is grossly normal. Tricuspid valve regurgitation is trivial. No evidence of tricuspid stenosis. Aortic Valve: The aortic valve is grossly normal. There is mild calcification of the aortic valve. There is mild thickening of the aortic valve. Aortic valve regurgitation is not visualized. Mild aortic valve sclerosis is present, with no evidence of aortic valve stenosis. Aortic valve mean gradient measures 2.0 mmHg. Aortic valve peak gradient measures 3.0 mmHg. Aortic valve area, by VTI measures 2.69 cm. Pulmonic Valve: The pulmonic valve was not well visualized. Pulmonic valve regurgitation is not visualized. No evidence of pulmonic stenosis. Aorta: The aortic root and ascending aorta are structurally normal, with no evidence of dilitation. Venous: The inferior vena cava is normal in size with greater than 50% respiratory variability, suggesting right atrial pressure of 3 mmHg. IAS/Shunts: The interatrial septum was not well visualized.  LEFT VENTRICLE PLAX 2D LVIDd:         4.80 cm  Diastology LVIDs:         3.50 cm  LV e' medial:    7.18 cm/s LV PW:         1.20 cm  LV E/e' medial:  8.5 LV IVS:        1.40 cm  LV e' lateral:   8.70 cm/s LVOT diam:     2.10 cm  LV E/e' lateral: 7.0 LV SV:         58 LV SV Index:   30 LVOT Area:     3.46 cm  RIGHT VENTRICLE             IVC RV S prime:     14.90 cm/s  IVC diam: 1.50 cm TAPSE (M-mode): 2.9 cm LEFT ATRIUM         Index LA diam:    3.60 cm 1.83 cm/m  AORTIC VALVE AV Area (Vmax):    2.77 cm AV Area (Vmean):   2.73 cm AV Area (VTI):     2.69 cm AV Vmax:            86.90 cm/s AV Vmean:          56.500 cm/s AV VTI:            0.216 m AV Peak Grad:      3.0 mmHg AV Mean Grad:      2.0 mmHg LVOT Vmax:         69.40 cm/s LVOT Vmean:        44.600 cm/s LVOT VTI:          0.168 m LVOT/AV VTI ratio: 0.78  AORTA Ao Root diam: 3.30 cm Ao Asc diam:  3.20 cm MITRAL VALVE MV Area (PHT): 3.31 cm    SHUNTS MV Decel Time: 229 msec    Systemic VTI:  0.17 m MV E velocity: 61.30 cm/s  Systemic Diam: 2.10 cm MV A velocity: 69.80 cm/s MV E/A ratio:  0.88 Buford Dresser MD Electronically signed by Buford Dresser MD Signature Date/Time: 07/31/2020/12:07:00 PM    Final    CT Angio Chest/Abd/Pel for Dissection W and/or Wo Contrast  Result Date: 07/30/2020 CLINICAL DATA:  Abdominal pain, aortic dissection suspected EXAM: CT ANGIOGRAPHY CHEST, ABDOMEN AND PELVIS TECHNIQUE: Non-contrast CT of the chest was initially obtained. Multidetector CT imaging through the chest, abdomen and pelvis was performed using the standard protocol during bolus administration of intravenous contrast. Multiplanar reconstructed images and MIPs were obtained and reviewed to evaluate the vascular anatomy. CONTRAST:  17mL OMNIPAQUE IOHEXOL 350 MG/ML SOLN COMPARISON:  11/19/2019 FINDINGS: CTA CHEST FINDINGS Cardiovascular: Preferential opacification of the thoracic aorta. Normal contour and caliber of the thoracic aorta. No evidence of aneurysm, dissection, or other acute aortic pathology. Mild mixed calcific atherosclerosis. Normal heart size. Three-vessel coronary artery calcifications. No pericardial effusion. Mediastinum/Nodes: No enlarged mediastinal, hilar, or axillary lymph nodes. The mid and lower esophagus is thickened and fluid-filled. Thyroid gland and trachea demonstrate no significant findings. Lungs/Pleura: Mild centrilobular and paraseptal emphysema. Background of very fine centrilobular nodularity. Diffuse bilateral bronchial wall thickening. No pleural effusion or pneumothorax. Musculoskeletal:  No chest wall abnormality. No acute or significant osseous findings. Review of the MIP images confirms the above findings. CTA ABDOMEN AND PELVIS FINDINGS VASCULAR Normal contour and caliber of the abdominal aorta. No evidence of aneurysm, dissection, or other acute aortic pathology. Standard branching pattern of the abdominal aorta, with solitary bilateral renal arteries. Moderate mixed calcific atherosclerosis. Review of the MIP images confirms the above findings. NON-VASCULAR Hepatobiliary: No solid liver abnormality is seen. No gallstones, gallbladder wall thickening, or biliary dilatation. Pancreas: Unremarkable. No pancreatic ductal dilatation or surrounding inflammatory changes. Spleen: Normal in size without significant abnormality. Adrenals/Urinary Tract: Adrenal glands are unremarkable. Small nonobstructive calculus in the midportion of the left kidney. Simple bilateral renal cysts. No hydronephrosis. Thickening of the urinary bladder, likely secondary to chronic outlet obstruction. Stomach/Bowel: Stomach is within normal limits. Appendix appears normal. No evidence of bowel wall thickening, distention, or inflammatory changes. Lymphatic: No enlarged abdominal or pelvic lymph nodes. Reproductive: Penile implant and right hemi abdominal reservoir. Other: No abdominal wall hernia or abnormality. No abdominopelvic ascites. Musculoskeletal: No acute or significant osseous findings. Review of the MIP images confirms the above findings. IMPRESSION: 1. Normal contour and caliber of the thoracic and abdominal aorta. No evidence of aneurysm, dissection, or other acute aortic pathology. Moderate mixed calcific atherosclerosis. 2. The mid and lower esophagus is thickened and fluid-filled, suggestive of reflux esophagitis. Malignancy is difficult to strictly exclude. 3. Mild emphysema. 4. Background of very fine centrilobular nodularity, consistent with smoking-related respiratory bronchiolitis. 5. Diffuse bilateral  bronchial wall thickening, consistent with nonspecific infectious or inflammatory bronchitis. 6. Coronary artery disease. 7. Small nonobstructive left renal calculus. Aortic Atherosclerosis (ICD10-I70.0) and Emphysema (ICD10-J43.9). Electronically Signed   By: Eddie Candle M.D.   On: 07/30/2020 16:14   DG ESOPHAGUS W SINGLE CM (SOL OR THIN BA)  Result Date: 08/07/2020 CLINICAL DATA:  Difficulty swallowing rule out stricture EXAM: ESOPHOGRAM/BARIUM SWALLOW TECHNIQUE: Single contrast examination was performed using thin barium and barium tablet. FLUOROSCOPY TIME:  Fluoroscopy Time:  1 minutes 48 seconds Radiation Exposure Index (if provided by the fluoroscopic device): Number of Acquired Spot Images: 6 COMPARISON:  None. FINDINGS: Esophageal dysmotility with moderate decreased esophageal peristalsis. No aspiration identified Mild to moderate narrowing of the distal esophagus at the GE junction. Smooth  margins and benign appearance. No mass or ulceration Barium tablet did not pass through the stricture of the distal esophagus despite multiple attempts water and upright positioning. IMPRESSION: Benign-appearing stricture distal esophagus. Barium tablet did not pass. Moderate esophageal dysmotility. Electronically Signed   By: Franchot Gallo M.D.   On: 08/07/2020 14:16    Microbiology: Recent Results (from the past 240 hour(s))  SARS CORONAVIRUS 2 (TAT 6-24 HRS) Nasopharyngeal Nasopharyngeal Swab     Status: None   Collection Time: 07/30/20  2:45 PM   Specimen: Nasopharyngeal Swab  Result Value Ref Range Status   SARS Coronavirus 2 NEGATIVE NEGATIVE Final    Comment: (NOTE) SARS-CoV-2 target nucleic acids are NOT DETECTED.  The SARS-CoV-2 RNA is generally detectable in upper and lower respiratory specimens during the acute phase of infection. Negative results do not preclude SARS-CoV-2 infection, do not rule out co-infections with other pathogens, and should not be used as the sole basis for treatment  or other patient management decisions. Negative results must be combined with clinical observations, patient history, and epidemiological information. The expected result is Negative.  Fact Sheet for Patients: SugarRoll.be  Fact Sheet for Healthcare Providers: https://www.woods-mathews.com/  This test is not yet approved or cleared by the [place] FDA and  has been authorized for detection and/or diagnosis of SARS-CoV-2 by FDA under an Emergency Use Authorization (EUA). This EUA will remain  in effect (meaning this test can be used) for the duration of the COVID-19 declaration under Se ction 564(b)(1) of the Act, 21 U.S.C. section 360bbb-3(b)(1), unless the authorization is terminated or revoked sooner.  Performed at [facility], [place] [address] [place], [place] [address]   Culture, blood (routine x 2)     Status: None (Preliminary result)   Collection Time: 08/05/20  4:30 AM   Specimen: BLOOD LEFT HAND  Result Value Ref Range Status   Specimen Description   Final    BLOOD LEFT HAND Performed at [facility], [address] [place], [place] [address]    Special Requests   Final    BOTTLES DRAWN AEROBIC AND ANAEROBIC Blood Culture adequate volume Performed at [facility], [address] [place], [place] [address]    Culture   Final    NO GROWTH 4 DAYS Performed at [facility], [place] [address] [place], [place] [address]    Report Status PENDING  Incomplete  Culture, blood (routine x 2)     Status: None (Preliminary result)   Collection Time: 08/05/20  4:50 AM   Specimen: BLOOD  Result Value Ref Range Status   Specimen Description   Final    BLOOD RIGHT ANTECUBITAL Performed at [facility], [address] [place], [place] [address]    Special Requests   Final    BOTTLES DRAWN AEROBIC AND ANAEROBIC Blood Culture adequate volume Performed at [facility], [address] [place], [place] [address]    Culture   Final    NO GROWTH 4 DAYS Performed at [facility], [place] [address], [place], [place] [address]    Report Status PENDING  Incomplete  MRSA Next Gen by PCR, Nasal     Status: None   Collection Time: 08/05/20  4:56 AM  Result Value Ref Range Status   MRSA by PCR Next Gen NOT DETECTED NOT DETECTED Final    Comment: (NOTE) The GeneXpert MRSA Assay (FDA approved for NASAL  specimens only), is one component of a comprehensive MRSA colonization surveillance program. It is not intended to diagnose MRSA infection nor to guide or monitor treatment for MRSA infections. Test performance is not FDA approved in patients less than 56 years old. Performed at [facility], [place] [address] [place], [place] [address]   Culture, Urine     Status: None   Collection Time: 08/05/20  4:23 PM   Specimen: Urine, Random  Result Value Ref Range Status   Specimen Description URINE, RANDOM  Final   Special Requests NONE  Final   Culture   Final    NO GROWTH Performed at [facility], [place] [address], [place], [place] [address]    Report Status 08/06/2020 FINAL  Final     Labs: Basic Metabolic Panel: Recent Labs  Lab 08/05/20 0441 08/06/20 0116 08/06/20 1015 08/07/20 1343 08/08/20 0307 08/09/20 0432  NA 136 134* 136 133* 133* 135  K 4.5 3.6 4.1 4.5 4.2 3.4*  CL 102 103 101 97* 99 100  CO2 24 24 28 25 28 27   GLUCOSE 113* 109* 109* 154* 111* 122*  BUN 51* 47* 43* 32* 25* 20  CREATININE 2.71* 2.18* 2.01* 1.58* 1.59* 1.46*  CALCIUM 8.8* 8.6* 8.8* 8.8* 8.5* 8.2*  MG 2.1  --   --   --   --   --    Liver Function Tests: Recent Labs  Lab 08/05/20 0441  AST 20  ALT 17  ALKPHOS 46  BILITOT 0.8  PROT 5.7*  ALBUMIN 2.4*   No results for input(s): LIPASE, AMYLASE in the last 168 hours. No results for input(s): AMMONIA in the last 168 hours. CBC: Recent Labs  Lab 08/05/20 0441 08/06/20 0116 08/07/20 0332 08/09/20 0432  WBC 11.6*  11.2* 9.4 7.1  NEUTROABS 10.0*  --  7.2 4.9  HGB 11.5* 10.8* 12.3* 11.6*  HCT 34.4* 32.6* 36.5* 34.3*  MCV 98.3 98.8 97.6 96.6  PLT PLATELET CLUMPS NOTED ON SMEAR, UNABLE TO ESTIMATE 93* 93* 111*   Cardiac Enzymes: No results for input(s): CKTOTAL, CKMB, CKMBINDEX, TROPONINI in the last 168 hours. BNP: BNP (last 3 results) Recent Labs    08/05/20 0441  BNP 382.2*    ProBNP (last 3 results) No results for input(s): PROBNP in the last 8760 hours.  CBG: Recent Labs  Lab 08/07/20 1120 08/07/20 1259 08/07/20 2017 08/08/20 0627  GLUCAP 56* 97 91 86       Signed:  Alma Friendly, MD [facility] 08/09/2020, 1:16 PM

## 2020-08-09 NOTE — Progress Notes (Signed)
DISCHARGE NOTE HOME ONTARIO PETTENGILL to be discharged Home per MD order. Discussed prescriptions and follow up appointments with the patient. Prescriptions given to patient; medication list explained in detail. Patient verbalized understanding.  Skin clean, dry and intact without evidence of skin break down, no evidence of skin tears noted. IV catheter discontinued intact. Site without signs and symptoms of complications. Dressing and pressure applied. Pt denies pain at the site currently. No complaints noted.  Patient free of lines, drains, and wounds.   An After Visit Summary (AVS) was printed and given to the patient. Patient escorted via wheelchair, and discharged home via private auto.  Mikki Santee, RN

## 2020-08-10 ENCOUNTER — Encounter (HOSPITAL_COMMUNITY): Payer: Self-pay | Admitting: Gastroenterology

## 2020-08-10 DIAGNOSIS — R1310 Dysphagia, unspecified: Secondary | ICD-10-CM | POA: Diagnosis not present

## 2020-08-10 DIAGNOSIS — M1990 Unspecified osteoarthritis, unspecified site: Secondary | ICD-10-CM | POA: Diagnosis not present

## 2020-08-10 DIAGNOSIS — I959 Hypotension, unspecified: Secondary | ICD-10-CM | POA: Diagnosis not present

## 2020-08-10 DIAGNOSIS — J449 Chronic obstructive pulmonary disease, unspecified: Secondary | ICD-10-CM | POA: Diagnosis not present

## 2020-08-10 DIAGNOSIS — I10 Essential (primary) hypertension: Secondary | ICD-10-CM | POA: Diagnosis not present

## 2020-08-10 DIAGNOSIS — M5134 Other intervertebral disc degeneration, thoracic region: Secondary | ICD-10-CM | POA: Diagnosis not present

## 2020-08-10 DIAGNOSIS — E785 Hyperlipidemia, unspecified: Secondary | ICD-10-CM | POA: Diagnosis not present

## 2020-08-10 LAB — CULTURE, BLOOD (ROUTINE X 2)
Culture: NO GROWTH
Culture: NO GROWTH
Special Requests: ADEQUATE
Special Requests: ADEQUATE

## 2020-08-10 NOTE — Progress Notes (Addendum)
Message sent to [facility] concerning approval for [facility] services. Amy with [facility] is reviewing the case and will follow up with TOC. Aneta Mins Transition of Care Supervisor [phone]  10:10 am - [name] [name] has accepted the patient for [place]. [name] (daughter) called and updated. Mindi Slicker RN,MHA,BSN

## 2020-08-15 NOTE — Progress Notes (Signed)
Cardiology Office Note    Date:  08/16/2020   ID:  [name] [name], [name] [name] Jul 27, 1940, MRN [id number]   PCP:  [name], [place]  Cardiologist:  Sinclair Grooms, MD   Advanced Practice Provider:  No care team member to display Electrophysiologist:  None   [id number]   No chief complaint on file.   History of Present Illness:  [name] [name] is a 80 y.o. male with history of CAD with CTO RCA, HTN, HLD, obesity, OSA, DM2, CKD, memory loss, tobacco abuse.  Patient last saw Dr. [name] 04/23/19 and was having occasional angina relieved with NTG. Ranexa increased and advised to quit smoking. Metoprolol had been decreased by PCP for hypotension and weak spells which could have contributed to angina.  Patient discharged 08/09/20 after admission with sepsis/CAP, hypotension, AKI.  He continues to have trouble with dysphagia, endo consistent with achalasia.  Patient comes in with his daughter who works as a Psychologist, sport and exercise with Dr. Felipa Eth. Was told to give him Imdur and metoprolol only if BP >120. BP 156/64 this am at home. Patient had some chest tightness the other night and kept coming back after an hour. Took 4 tablets over 4 hr period-was in bed.Monday am felt short of breath but pulse ox was 96%. Was going up/down stairs.  Past Medical History:  Diagnosis Date   Adenomatous polyp 2007   Anginal pain ([place])    Chronic    Anxiety    Arthritis    "my body"   B12 deficiency    BPH (benign prostatic hyperplasia)    Chronic back pain    "all over"   Chronic low back pain    COPD (chronic obstructive pulmonary disease) (HCC)    Coronary artery disease 2009   DDD (degenerative disc disease), cervical    DDD (degenerative disc disease), lumbosacral    DDD (degenerative disc disease), thoracic    Depression    Gastroesophageal reflux    Gynecomastia 06/2009   History of kidney stones    Hyperglycemia    Hyperlipidemia LDL goal < 70    MI  (myocardial infarction) ([place]) 08/1989; 1993   OSA on CPAP    "have mask; don't wear it" (02/02/2014) 09/25/15- now has a BiPAp- doesn'tt use it   Peptic ulcer disease 1959   Pre-diabetes    Right bundle branch block    Tobacco abuse    Vertebral compression fracture ([place]) 1970    Past Surgical History:  Procedure Laterality Date   BOTOX INJECTION  08/08/2020   Procedure: BOTOX INJECTION;  Surgeon: Clarene Essex, MD;  Location: [facility];  Service: Endoscopy;;   BYPASS GRAFT POPLITEAL TO POPLITEAL Left 06/2005   Dr Donnetta Hutching   BYPASS GRAFT POPLITEAL TO POPLITEAL Right 02/2001   Dr Deon Pilling   CARDIAC CATHETERIZATION  1991; 1993; 08/2012; 11/2013; 02/02/2014   I've had 6-7 total   CARDIAC CATHETERIZATION  02/02/2014   Procedure: LEFT HEART CATH AND CORONARY ANGIOGRAPHY;  Surgeon: Jettie Booze, MD;  mild disease in LAD/CFX, RCA w/ CTO, unsuccessfull PCI both w/ antegrade and retrograde approaches    CARPAL TUNNEL RELEASE Left 06/01/2019   Procedure: LEFT CARPAL TUNNEL RELEASE;  Surgeon: Daryll Brod, MD;  Location: [facility];  Service: Orthopedics;  Laterality: Left;  FOREARM BLOCK   CATARACT EXTRACTION W/ INTRAOCULAR LENS  IMPLANT, BILATERAL Bilateral ~ 2012   COLONOSCOPY W/ POLYPECTOMY     CORONARY ANGIOPLASTY  1990's  CYSTOSCOPY WITH STENT PLACEMENT Right 11/19/2019   Procedure: CYSTOSCOPY WITH RIGHT URETERAL STENT PLACEMENT;  Surgeon: Lucas Mallow, MD;  Location: [facility];  Service: Urology;  Laterality: Right;   CYSTOSCOPY/URETEROSCOPY/HOLMIUM LASER/STENT PLACEMENT Right 12/22/2019   Procedure: CYSTOSCOPY RIGHT URETEROSCOPY/HOLMIUM LASER/STENT PLACEMENT;  Surgeon: Ardis Hughs, MD;  Location: [facility];  Service: Urology;  Laterality: Right;   ESOPHAGOGASTRODUODENOSCOPY (EGD) WITH ESOPHAGEAL DILATION  11/2007   ESOPHAGOGASTRODUODENOSCOPY (EGD) WITH PROPOFOL N/A 09/28/2015   Procedure: ESOPHAGOGASTRODUODENOSCOPY (EGD) WITH PROPOFOL;  Surgeon: Clarene Essex, MD;  Location: [facility]  ENDOSCOPY;  Service: Endoscopy;  Laterality: N/A;   ESOPHAGOGASTRODUODENOSCOPY (EGD) WITH PROPOFOL N/A 09/09/2017   Procedure: ESOPHAGOGASTRODUODENOSCOPY (EGD) WITH PROPOFOL;  Surgeon: Clarene Essex, MD;  [facility];  Service: Endoscopy;  Laterality: N/A;   ESOPHAGOGASTRODUODENOSCOPY (EGD) WITH PROPOFOL N/A 03/09/2020   Procedure: ESOPHAGOGASTRODUODENOSCOPY (EGD) WITH PROPOFOL WITH DIL;  Surgeon: Clarene Essex, MD;  [facility];  Service: Endoscopy;  Laterality: N/A;   ESOPHAGOGASTRODUODENOSCOPY (EGD) WITH PROPOFOL N/A 08/08/2020   Procedure: ESOPHAGOGASTRODUODENOSCOPY (EGD) WITH PROPOFOL;  Surgeon: Clarene Essex, MD;  Location: [facility];  Service: Endoscopy;  Laterality: N/A;  Probable Botox injection, possible dilatation   EYE SURGERY Bilateral    JOINT REPLACEMENT     LEFT HEART CATH AND CORONARY ANGIOGRAPHY N/A 08/10/2018   Procedure: LEFT HEART CATH AND CORONARY ANGIOGRAPHY;  Surgeon: Troy Sine, MD;  Location: [facility];  Service: Cardiovascular;  Laterality: N/A;   LEFT HEART CATHETERIZATION WITH CORONARY ANGIOGRAM N/A 09/03/2012   Procedure: LEFT HEART CATHETERIZATION WITH CORONARY ANGIOGRAM;  Surgeon: Sinclair Grooms, MD;  Location: [facility] CATH LAB;  Service: Cardiovascular;  Laterality: N/A;   LEFT HEART CATHETERIZATION WITH CORONARY ANGIOGRAM N/A 12/01/2013   Procedure: LEFT HEART CATHETERIZATION WITH CORONARY ANGIOGRAM;  Surgeon: Sinclair Grooms, MD;  Location: [facility] CATH LAB;  Service: Cardiovascular;  Laterality: N/A;   LOWER EXTREMITY ANGIOGRAM Left 05/2005   L pop-pop BPG patent   NAILBED REPAIR Right 09/17/2016   Procedure: ABLATION NAILBED RIGHT INDEX;  Surgeon: Daryll Brod, MD;  Location: [facility];  Service: Orthopedics;  Laterality: Right;   NASAL SEPTUM SURGERY  1977   PENILE PROSTHESIS IMPLANT  1990's   SAVORY DILATION N/A 09/28/2015   Procedure: SAVORY DILATION;  Surgeon: Clarene Essex, MD;  Location: [facility] ENDOSCOPY;  Service: Endoscopy;   Laterality: N/A;  have balloons available   SAVORY DILATION N/A 09/09/2017   Procedure: SAVORY DILATION;  Surgeon: Clarene Essex, MD;  [facility];  Service: Endoscopy;  Laterality: N/A;   SAVORY DILATION N/A 03/09/2020   Procedure: SAVORY DILATION;  Surgeon: Clarene Essex, MD;  Location[facility];  Service: Endoscopy;  Laterality: N/A;   SCROTAL SURGERY  08/1999   pump revision/notes 07/11/2010   SKIN FULL THICKNESS GRAFT Right 09/17/2016   Procedure: SKIN GRAFT FROM UPPER ARM;  Surgeon: Daryll Brod, MD;  Location: [place];  Service: Orthopedics;  Laterality: Right;   TOTAL KNEE ARTHROPLASTY Left 01/2009    Current Medications: Current Meds  Medication Sig   aspirin EC 81 MG tablet Take 1 tablet (81 mg total) by mouth daily. Resume after 4 days.   buPROPion (WELLBUTRIN) 100 MG tablet Take 200 mg by mouth at bedtime.    Cholecalciferol (VITAMIN D3) 50 MCG (2000 UT) TABS Take 2,000 Units by mouth every morning.   donepezil (ARICEPT) 5 MG tablet Take 5 mg by mouth at bedtime.   finasteride (PROSCAR) 5 MG tablet Take 5  mg by mouth at bedtime.   HYDROcodone-acetaminophen (NORCO/VICODIN) 5-325 MG tablet Take 1 tablet by mouth every 6 (six) hours as needed for severe pain.   isosorbide mononitrate (IMDUR) 60 MG 24 hr tablet Take 1 tablet (60 mg total) by mouth daily.   nitroGLYCERIN (NITROSTAT) 0.4 MG SL tablet Place 0.4 mg under the tongue See admin instructions. Dissolve 0.4 mg sublingually 10 minutes before each meal for esophageal spasms and as needed for chest pain   ondansetron (ZOFRAN) 4 MG tablet Take 4 mg by mouth every 8 (eight) hours as needed for nausea or vomiting.   pantoprazole (PROTONIX) 40 MG tablet Take 40 mg by mouth 2 (two) times daily with a meal.   polyethylene glycol powder (GLYCOLAX/MIRALAX) 17 GM/SCOOP powder Take 17 g by mouth daily as needed for mild constipation (MIX AND DRINK).   ranolazine (RANEXA) 1000 MG SR tablet Take 1 tablet (1,000 mg  total) by mouth 2 (two) times daily.   rosuvastatin (CRESTOR) 10 MG tablet Take 10 mg by mouth every morning.   traMADol (ULTRAM) 50 MG tablet Take 1 tablet (50 mg total) by mouth every 6 (six) hours as needed.   vitamin B-12 (CYANOCOBALAMIN) 1000 MCG tablet Take 1,000 mcg by mouth every morning.     Allergies:   Doxycycline, Flomax [tamsulosin], Lipitor [atorvastatin], Rhubarb, Sulfa antibiotics, Sulfacetamide sodium, Clindamycin/lincomycin, Other, and Vytorin [ezetimibe-simvastatin]   Social History   Socioeconomic History   Marital status: Widowed    Spouse name: Not on file   Number of children: Not on file   Years of education: Not on file   Highest education level: Not on file  Occupational History   Not on file  Tobacco Use   Smoking status: Every Day    Packs/day: 0.25    Years: 59.00    Pack years: 14.75    Types: Cigarettes   Smokeless tobacco: Former    Types: Chew   Tobacco comments:    patient states quit around month ago(december 2021)  Vaping Use   Vaping Use: Never used  Substance and Sexual Activity   Alcohol use: No    Alcohol/week: 0.0 standard drinks   Drug use: No   Sexual activity: Not on file  Other Topics Concern   Not on file  Social History Narrative   Tobacco use cigarettes: former smoker, quit in year 2014. Pack-year Hx: 41, tobacco history last updated 05/12/2013, additional findings: tobacco user moderate cigarette smoker (10-19 cigs/day), additional findings: tobacco Non-User current non-smoker, no smoking. No alcohol, stopped 1981. 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 Marital status: married. 4 children.   Social Determinants of Health   Financial Resource Strain: Not on file  Food Insecurity: Not on file  Transportation Needs: Not on file  Physical Activity: Not on file  Stress: Not on file  Social Connections: Not on file     Family History:  The patient's  family history  includes Alzheimer's disease (age of onset: 66) in his mother; Diabetes in his daughter; Diabetes (age of onset: 78) in his father; Fibromyalgia (age of onset: 53) in his sister; Hypertension in his son; Obesity in his daughter; Peripheral vascular disease in his father; Pneumonia in his father.   ROS:   Please see the history of present illness.    ROS All other systems reviewed and are negative.   PHYSICAL EXAM:   VS:  BP 130/64   Pulse (!) 55   Ht 5\' 9"  (  1.753 m)   Wt 176 lb 6.4 oz (80 kg)   SpO2 98%   BMI 26.05 kg/m   Physical Exam  GEN: Well nourished, well developed, in no acute distress  Neck: no JVD, carotid bruits, or masses Cardiac:RRR; 2/6 systolic murmur apex Respiratory:  clear to auscultation bilaterally, normal work of breathing GI: soft, nontender, nondistended, + BS Ext: without cyanosis, clubbing, or edema, Good distal pulses bilaterally Neuro:  Alert and Oriented x 3, Psych: euthymic mood, full affect  Wt Readings from Last 3 Encounters:  08/16/20 176 lb 6.4 oz (80 kg)  08/07/20 179 lb 14.3 oz (81.6 kg)  07/31/20 178 lb (80.7 kg)      Studies/Labs Reviewed:   EKG:  EKG is not ordered today.     Recent Labs: 08/05/2020: ALT 17; B Natriuretic Peptide 382.2; Magnesium 2.1 08/09/2020: BUN 20; Creatinine, Ser 1.46; Hemoglobin 11.6; Platelets 111; Potassium 3.4; Sodium 135   Lipid Panel    Component Value Date/Time   CHOL 125 07/30/2020 2039   TRIG 109 07/30/2020 2039   HDL 44 07/30/2020 2039   CHOLHDL 2.8 07/30/2020 2039   VLDL 22 07/30/2020 2039   LDLCALC 59 07/30/2020 2039    Additional studies/ records that were reviewed today include:  Coronary angiography 2020: Diagnostic Dominance: Right      Risk Assessment/Calculations:         ASSESSMENT:    1. Coronary artery disease involving native coronary artery of native heart with unstable angina pectoris ([place])   2. Essential hypertension   3. Hyperlipidemia with target LDL less than 70    4. Overweight   5. Tobacco abuse      PLAN:  In order of problems listed above:  CAD with CTO RCA on cath in 2020 increase in angina since meds held during recent hospitalization.  Blood pressure coming up so we will try to restart Imdur at 60 mg once daily at night.  If he tolerates this his daughter will try to get him back on metoprolol.  She keeps good track of his blood pressures and will send [place] a note through [facility].  They have trouble breaking the metoprolol in half so consider changing to Toprol-XL 25 mg once daily.  Essential hypertension recent hypotension during hospitalization with sepsis we will restart Imdur at lower dose 60 mg.  Hyperlipidemia continue Crestor.  LDL 59 on 07/30/2020   CKD with recent hospitalization for sepsis/CAP and creatinine up to 2.7 but down to 1.46 at discharge.  He has trouble drinking water with all his GI problems but tends to go up.  I asked him to sip water slowly throughout the day.  Decrease sugary drinks in his diet.  Shared Decision Making/Informed Consent        Medication Adjustments/Labs and Tests Ordered: Current medicines are reviewed at length with the patient today.  Concerns regarding medicines are outlined above.  Medication changes, Labs and Tests ordered today are listed in the Patient Instructions below. Patient Instructions  Medication Instructions:  Resume isosorbide mononitrate (Imdur) 60 mg, take one tablet by mouth every night. *If you need a refill on your cardiac medications before your next appointment, please call your pharmacy*   Lab Work: None If you have labs (blood work) drawn today and your tests are completely normal, you will receive your results only by: [place] (if you have MyChart) OR A paper copy in the mail If you have any lab test that is abnormal or we need to  change your treatment, we will call you to review the results.   Testing/Procedures: None   Follow-Up: At [facility], you  and your health needs are our priority.  As part of our continuing mission to provide you with exceptional heart care, we have created designated Provider Care Teams.  These Care Teams include your primary Cardiologist (physician) and Advanced Practice Providers (APPs -  Physician Assistants and Nurse Practitioners) who all work together to provide you with the care you need, when you need it.   Your next appointment:   10/18/20 at 11:15 AM  The format for your next appointment:   In Person  Provider:   Ermalinda Barrios, PA-C   Signed, Ermalinda Barrios, PA-C  08/16/2020 11:49 AM    [address], [place], [place]  [address] Phone: [phone]; Fax: [phone]

## 2020-08-16 ENCOUNTER — Encounter: Payer: Self-pay | Admitting: Physician Assistant

## 2020-08-16 ENCOUNTER — Other Ambulatory Visit: Payer: Self-pay

## 2020-08-16 ENCOUNTER — Ambulatory Visit: Payer: Medicare Other | Admitting: Physician Assistant

## 2020-08-16 VITALS — BP 130/64 | HR 55 | Ht 69.0 in | Wt 176.4 lb

## 2020-08-16 DIAGNOSIS — I25110 Atherosclerotic heart disease of native coronary artery with unstable angina pectoris: Secondary | ICD-10-CM | POA: Diagnosis not present

## 2020-08-16 DIAGNOSIS — I10 Essential (primary) hypertension: Secondary | ICD-10-CM | POA: Diagnosis not present

## 2020-08-16 DIAGNOSIS — E785 Hyperlipidemia, unspecified: Secondary | ICD-10-CM | POA: Diagnosis not present

## 2020-08-16 DIAGNOSIS — Z720 Tobacco use: Secondary | ICD-10-CM | POA: Diagnosis not present

## 2020-08-16 DIAGNOSIS — E663 Overweight: Secondary | ICD-10-CM

## 2020-08-16 MED ORDER — ISOSORBIDE MONONITRATE ER 60 MG PO TB24: 60.0000 mg | ORAL_TABLET | Freq: Every day | ORAL | 3 refills | Status: DC

## 2020-08-16 NOTE — Patient Instructions (Signed)
Medication Instructions:  Resume isosorbide mononitrate (Imdur) 60 mg, take one tablet by mouth every night. *If you need a refill on your cardiac medications before your next appointment, please call your pharmacy*   Lab Work: None If you have labs (blood work) drawn today and your tests are completely normal, you will receive your results only by: [place] (if you have MyChart) OR A paper copy in the mail If you have any lab test that is abnormal or we need to change your treatment, we will call you to review the results.   Testing/Procedures: None   Follow-Up: At [facility], you and your health needs are our priority.  As part of our continuing mission to provide you with exceptional heart care, we have created designated Provider Care Teams.  These Care Teams include your primary Cardiologist (physician) and Advanced Practice Providers (APPs -  Physician Assistants and Nurse Practitioners) who all work together to provide you with the care you need, when you need it.   Your next appointment:   10/18/20 at 11:15 AM  The format for your next appointment:   In Person  Provider:   Ermalinda Barrios, PA-C

## 2020-08-16 NOTE — Addendum Note (Signed)
Addended by: Juventino Slovak on: 08/16/2020 05:07 PM   Modules accepted: Orders

## 2020-08-17 DIAGNOSIS — R1310 Dysphagia, unspecified: Secondary | ICD-10-CM | POA: Diagnosis not present

## 2020-08-17 DIAGNOSIS — I10 Essential (primary) hypertension: Secondary | ICD-10-CM | POA: Diagnosis not present

## 2020-08-17 DIAGNOSIS — E785 Hyperlipidemia, unspecified: Secondary | ICD-10-CM | POA: Diagnosis not present

## 2020-08-17 DIAGNOSIS — J449 Chronic obstructive pulmonary disease, unspecified: Secondary | ICD-10-CM | POA: Diagnosis not present

## 2020-08-17 DIAGNOSIS — I959 Hypotension, unspecified: Secondary | ICD-10-CM | POA: Diagnosis not present

## 2020-08-17 DIAGNOSIS — M1990 Unspecified osteoarthritis, unspecified site: Secondary | ICD-10-CM | POA: Diagnosis not present

## 2020-08-17 DIAGNOSIS — M5134 Other intervertebral disc degeneration, thoracic region: Secondary | ICD-10-CM | POA: Diagnosis not present

## 2020-08-23 DIAGNOSIS — J449 Chronic obstructive pulmonary disease, unspecified: Secondary | ICD-10-CM | POA: Diagnosis not present

## 2020-08-23 DIAGNOSIS — M5134 Other intervertebral disc degeneration, thoracic region: Secondary | ICD-10-CM | POA: Diagnosis not present

## 2020-08-23 DIAGNOSIS — M1990 Unspecified osteoarthritis, unspecified site: Secondary | ICD-10-CM | POA: Diagnosis not present

## 2020-08-23 DIAGNOSIS — R1310 Dysphagia, unspecified: Secondary | ICD-10-CM | POA: Diagnosis not present

## 2020-08-23 DIAGNOSIS — I959 Hypotension, unspecified: Secondary | ICD-10-CM | POA: Diagnosis not present

## 2020-08-23 DIAGNOSIS — I10 Essential (primary) hypertension: Secondary | ICD-10-CM | POA: Diagnosis not present

## 2020-08-23 DIAGNOSIS — E785 Hyperlipidemia, unspecified: Secondary | ICD-10-CM | POA: Diagnosis not present

## 2020-08-25 DIAGNOSIS — R1310 Dysphagia, unspecified: Secondary | ICD-10-CM | POA: Diagnosis not present

## 2020-08-25 DIAGNOSIS — I10 Essential (primary) hypertension: Secondary | ICD-10-CM | POA: Diagnosis not present

## 2020-08-25 DIAGNOSIS — J449 Chronic obstructive pulmonary disease, unspecified: Secondary | ICD-10-CM | POA: Diagnosis not present

## 2020-08-25 DIAGNOSIS — M1990 Unspecified osteoarthritis, unspecified site: Secondary | ICD-10-CM | POA: Diagnosis not present

## 2020-08-25 DIAGNOSIS — M5134 Other intervertebral disc degeneration, thoracic region: Secondary | ICD-10-CM | POA: Diagnosis not present

## 2020-08-25 DIAGNOSIS — E785 Hyperlipidemia, unspecified: Secondary | ICD-10-CM | POA: Diagnosis not present

## 2020-08-25 DIAGNOSIS — I959 Hypotension, unspecified: Secondary | ICD-10-CM | POA: Diagnosis not present

## 2020-08-29 DIAGNOSIS — M1990 Unspecified osteoarthritis, unspecified site: Secondary | ICD-10-CM | POA: Diagnosis not present

## 2020-08-29 DIAGNOSIS — R1310 Dysphagia, unspecified: Secondary | ICD-10-CM | POA: Diagnosis not present

## 2020-08-29 DIAGNOSIS — I959 Hypotension, unspecified: Secondary | ICD-10-CM | POA: Diagnosis not present

## 2020-08-29 DIAGNOSIS — I10 Essential (primary) hypertension: Secondary | ICD-10-CM | POA: Diagnosis not present

## 2020-08-29 DIAGNOSIS — J449 Chronic obstructive pulmonary disease, unspecified: Secondary | ICD-10-CM | POA: Diagnosis not present

## 2020-08-29 DIAGNOSIS — M5134 Other intervertebral disc degeneration, thoracic region: Secondary | ICD-10-CM | POA: Diagnosis not present

## 2020-08-29 DIAGNOSIS — E785 Hyperlipidemia, unspecified: Secondary | ICD-10-CM | POA: Diagnosis not present

## 2020-08-31 DIAGNOSIS — M5134 Other intervertebral disc degeneration, thoracic region: Secondary | ICD-10-CM | POA: Diagnosis not present

## 2020-08-31 DIAGNOSIS — I10 Essential (primary) hypertension: Secondary | ICD-10-CM | POA: Diagnosis not present

## 2020-08-31 DIAGNOSIS — M1990 Unspecified osteoarthritis, unspecified site: Secondary | ICD-10-CM | POA: Diagnosis not present

## 2020-08-31 DIAGNOSIS — J449 Chronic obstructive pulmonary disease, unspecified: Secondary | ICD-10-CM | POA: Diagnosis not present

## 2020-08-31 DIAGNOSIS — I959 Hypotension, unspecified: Secondary | ICD-10-CM | POA: Diagnosis not present

## 2020-08-31 DIAGNOSIS — E785 Hyperlipidemia, unspecified: Secondary | ICD-10-CM | POA: Diagnosis not present

## 2020-08-31 DIAGNOSIS — R1310 Dysphagia, unspecified: Secondary | ICD-10-CM | POA: Diagnosis not present

## 2020-09-04 DIAGNOSIS — I959 Hypotension, unspecified: Secondary | ICD-10-CM | POA: Diagnosis not present

## 2020-09-04 DIAGNOSIS — R1310 Dysphagia, unspecified: Secondary | ICD-10-CM | POA: Diagnosis not present

## 2020-09-04 DIAGNOSIS — M1990 Unspecified osteoarthritis, unspecified site: Secondary | ICD-10-CM | POA: Diagnosis not present

## 2020-09-04 DIAGNOSIS — M5134 Other intervertebral disc degeneration, thoracic region: Secondary | ICD-10-CM | POA: Diagnosis not present

## 2020-09-04 DIAGNOSIS — E785 Hyperlipidemia, unspecified: Secondary | ICD-10-CM | POA: Diagnosis not present

## 2020-09-04 DIAGNOSIS — I10 Essential (primary) hypertension: Secondary | ICD-10-CM | POA: Diagnosis not present

## 2020-09-04 DIAGNOSIS — J449 Chronic obstructive pulmonary disease, unspecified: Secondary | ICD-10-CM | POA: Diagnosis not present

## 2020-09-07 DIAGNOSIS — M1990 Unspecified osteoarthritis, unspecified site: Secondary | ICD-10-CM | POA: Diagnosis not present

## 2020-09-07 DIAGNOSIS — I10 Essential (primary) hypertension: Secondary | ICD-10-CM | POA: Diagnosis not present

## 2020-09-07 DIAGNOSIS — M5134 Other intervertebral disc degeneration, thoracic region: Secondary | ICD-10-CM | POA: Diagnosis not present

## 2020-09-07 DIAGNOSIS — J449 Chronic obstructive pulmonary disease, unspecified: Secondary | ICD-10-CM | POA: Diagnosis not present

## 2020-09-07 DIAGNOSIS — R1310 Dysphagia, unspecified: Secondary | ICD-10-CM | POA: Diagnosis not present

## 2020-09-07 DIAGNOSIS — E785 Hyperlipidemia, unspecified: Secondary | ICD-10-CM | POA: Diagnosis not present

## 2020-09-07 DIAGNOSIS — I959 Hypotension, unspecified: Secondary | ICD-10-CM | POA: Diagnosis not present

## 2020-09-08 DIAGNOSIS — R1310 Dysphagia, unspecified: Secondary | ICD-10-CM | POA: Diagnosis not present

## 2020-09-08 DIAGNOSIS — E785 Hyperlipidemia, unspecified: Secondary | ICD-10-CM | POA: Diagnosis not present

## 2020-09-08 DIAGNOSIS — I10 Essential (primary) hypertension: Secondary | ICD-10-CM | POA: Diagnosis not present

## 2020-09-08 DIAGNOSIS — J449 Chronic obstructive pulmonary disease, unspecified: Secondary | ICD-10-CM | POA: Diagnosis not present

## 2020-09-08 DIAGNOSIS — I959 Hypotension, unspecified: Secondary | ICD-10-CM | POA: Diagnosis not present

## 2020-09-08 DIAGNOSIS — M1990 Unspecified osteoarthritis, unspecified site: Secondary | ICD-10-CM | POA: Diagnosis not present

## 2020-09-08 DIAGNOSIS — M5134 Other intervertebral disc degeneration, thoracic region: Secondary | ICD-10-CM | POA: Diagnosis not present

## 2020-09-13 ENCOUNTER — Telehealth: Payer: Self-pay | Admitting: Interventional Cardiology

## 2020-09-13 DIAGNOSIS — M5134 Other intervertebral disc degeneration, thoracic region: Secondary | ICD-10-CM | POA: Diagnosis not present

## 2020-09-13 DIAGNOSIS — J449 Chronic obstructive pulmonary disease, unspecified: Secondary | ICD-10-CM | POA: Diagnosis not present

## 2020-09-13 DIAGNOSIS — E785 Hyperlipidemia, unspecified: Secondary | ICD-10-CM | POA: Diagnosis not present

## 2020-09-13 DIAGNOSIS — I10 Essential (primary) hypertension: Secondary | ICD-10-CM | POA: Diagnosis not present

## 2020-09-13 DIAGNOSIS — R1310 Dysphagia, unspecified: Secondary | ICD-10-CM | POA: Diagnosis not present

## 2020-09-13 DIAGNOSIS — M1990 Unspecified osteoarthritis, unspecified site: Secondary | ICD-10-CM | POA: Diagnosis not present

## 2020-09-13 DIAGNOSIS — I959 Hypotension, unspecified: Secondary | ICD-10-CM | POA: Diagnosis not present

## 2020-09-13 NOTE — Telephone Encounter (Signed)
Left message for home health nurse to call back. 

## 2020-09-13 NOTE — Telephone Encounter (Signed)
Home Health Nurse called to report orthostatic BP readings. . Home Health Nurse reports she visits the patient once weekly   STAT if patient feels like he/she is going to faint   Are you dizzy now? no  Do you feel faint or have you passed out? no  Do you have any other symptoms? no  Have you checked your HR and BP (record if available)?  Home Health Nurse took these BP readings 09/13/20  100/60 sitting 80/56 standing

## 2020-09-14 DIAGNOSIS — S51812A Laceration without foreign body of left forearm, initial encounter: Secondary | ICD-10-CM | POA: Diagnosis not present

## 2020-09-14 DIAGNOSIS — M1990 Unspecified osteoarthritis, unspecified site: Secondary | ICD-10-CM | POA: Diagnosis not present

## 2020-09-14 DIAGNOSIS — R1310 Dysphagia, unspecified: Secondary | ICD-10-CM | POA: Diagnosis not present

## 2020-09-14 DIAGNOSIS — M5134 Other intervertebral disc degeneration, thoracic region: Secondary | ICD-10-CM | POA: Diagnosis not present

## 2020-09-14 DIAGNOSIS — J449 Chronic obstructive pulmonary disease, unspecified: Secondary | ICD-10-CM | POA: Diagnosis not present

## 2020-09-14 DIAGNOSIS — I959 Hypotension, unspecified: Secondary | ICD-10-CM | POA: Diagnosis not present

## 2020-09-14 DIAGNOSIS — I10 Essential (primary) hypertension: Secondary | ICD-10-CM | POA: Diagnosis not present

## 2020-09-14 DIAGNOSIS — E785 Hyperlipidemia, unspecified: Secondary | ICD-10-CM | POA: Diagnosis not present

## 2020-09-15 NOTE — Telephone Encounter (Signed)
Called daughter and left detailed message (ok per DPR) with information from Dr. [name]. Advised to call back with any questions.

## 2020-09-15 NOTE — Telephone Encounter (Signed)
Take IMDUR at night. Engage PCP. Weight loss needs to be explained and dehydration excluded.

## 2020-09-15 NOTE — Telephone Encounter (Signed)
Spoke with pt and he states that he has been having issues with low BP since prior to seeing Ermalinda Barrios, PA-C last month.  Becomes lightheaded and dizzy occasionally with position changes.  Inquired if angina has improved since restarting Imdur '60mg'$  QD at visit with Selinda Eon and pt states that he doesn't really think it has.  Currently take Imdur '60mg'$  QD and Ranexa '1000mg'$  BID, no traditional BP meds.  Does take Norco 5/325 TID.  I seen in Michele's note where she mentioned daughter trying to reintroduce Metoprolol.  Pt unsure if he is taking and asked that I call his daughter, [name].  Spoke with [name] and she has not given pt his Metoprolol.  States BP usually 120/56 or lower.  She also mentions that pt is not eating or drinking well and has dropped quite a bit of weight (said use to be 240 and now in the 170s).  Daughter states she thought the angina had improved because he normally only feels it when walking around outside of climbing stairs which he has not been doing.  Advised I will send to Dr. [name] for review and advisement.

## 2020-09-18 DIAGNOSIS — I959 Hypotension, unspecified: Secondary | ICD-10-CM | POA: Diagnosis not present

## 2020-09-18 DIAGNOSIS — E785 Hyperlipidemia, unspecified: Secondary | ICD-10-CM | POA: Diagnosis not present

## 2020-09-18 DIAGNOSIS — M5134 Other intervertebral disc degeneration, thoracic region: Secondary | ICD-10-CM | POA: Diagnosis not present

## 2020-09-18 DIAGNOSIS — M1990 Unspecified osteoarthritis, unspecified site: Secondary | ICD-10-CM | POA: Diagnosis not present

## 2020-09-18 DIAGNOSIS — J449 Chronic obstructive pulmonary disease, unspecified: Secondary | ICD-10-CM | POA: Diagnosis not present

## 2020-09-18 DIAGNOSIS — I10 Essential (primary) hypertension: Secondary | ICD-10-CM | POA: Diagnosis not present

## 2020-09-18 DIAGNOSIS — R1310 Dysphagia, unspecified: Secondary | ICD-10-CM | POA: Diagnosis not present

## 2020-09-20 DIAGNOSIS — J449 Chronic obstructive pulmonary disease, unspecified: Secondary | ICD-10-CM | POA: Diagnosis not present

## 2020-09-20 DIAGNOSIS — I10 Essential (primary) hypertension: Secondary | ICD-10-CM | POA: Diagnosis not present

## 2020-09-20 DIAGNOSIS — I959 Hypotension, unspecified: Secondary | ICD-10-CM | POA: Diagnosis not present

## 2020-09-20 DIAGNOSIS — M1990 Unspecified osteoarthritis, unspecified site: Secondary | ICD-10-CM | POA: Diagnosis not present

## 2020-09-20 DIAGNOSIS — M5134 Other intervertebral disc degeneration, thoracic region: Secondary | ICD-10-CM | POA: Diagnosis not present

## 2020-09-20 DIAGNOSIS — E785 Hyperlipidemia, unspecified: Secondary | ICD-10-CM | POA: Diagnosis not present

## 2020-09-20 DIAGNOSIS — R1310 Dysphagia, unspecified: Secondary | ICD-10-CM | POA: Diagnosis not present

## 2020-09-21 DIAGNOSIS — I10 Essential (primary) hypertension: Secondary | ICD-10-CM | POA: Diagnosis not present

## 2020-09-21 DIAGNOSIS — E785 Hyperlipidemia, unspecified: Secondary | ICD-10-CM | POA: Diagnosis not present

## 2020-09-21 DIAGNOSIS — M5134 Other intervertebral disc degeneration, thoracic region: Secondary | ICD-10-CM | POA: Diagnosis not present

## 2020-09-21 DIAGNOSIS — J449 Chronic obstructive pulmonary disease, unspecified: Secondary | ICD-10-CM | POA: Diagnosis not present

## 2020-09-21 DIAGNOSIS — R1310 Dysphagia, unspecified: Secondary | ICD-10-CM | POA: Diagnosis not present

## 2020-09-21 DIAGNOSIS — M1990 Unspecified osteoarthritis, unspecified site: Secondary | ICD-10-CM | POA: Diagnosis not present

## 2020-09-21 DIAGNOSIS — I959 Hypotension, unspecified: Secondary | ICD-10-CM | POA: Diagnosis not present

## 2020-09-25 DIAGNOSIS — M5134 Other intervertebral disc degeneration, thoracic region: Secondary | ICD-10-CM | POA: Diagnosis not present

## 2020-09-25 DIAGNOSIS — R1310 Dysphagia, unspecified: Secondary | ICD-10-CM | POA: Diagnosis not present

## 2020-09-25 DIAGNOSIS — E785 Hyperlipidemia, unspecified: Secondary | ICD-10-CM | POA: Diagnosis not present

## 2020-09-25 DIAGNOSIS — M1990 Unspecified osteoarthritis, unspecified site: Secondary | ICD-10-CM | POA: Diagnosis not present

## 2020-09-25 DIAGNOSIS — I959 Hypotension, unspecified: Secondary | ICD-10-CM | POA: Diagnosis not present

## 2020-09-25 DIAGNOSIS — I10 Essential (primary) hypertension: Secondary | ICD-10-CM | POA: Diagnosis not present

## 2020-09-25 DIAGNOSIS — J449 Chronic obstructive pulmonary disease, unspecified: Secondary | ICD-10-CM | POA: Diagnosis not present

## 2020-09-26 ENCOUNTER — Other Ambulatory Visit: Payer: Self-pay | Admitting: Interventional Cardiology

## 2020-09-26 DIAGNOSIS — E785 Hyperlipidemia, unspecified: Secondary | ICD-10-CM | POA: Diagnosis not present

## 2020-09-26 DIAGNOSIS — Z96652 Presence of left artificial knee joint: Secondary | ICD-10-CM | POA: Diagnosis not present

## 2020-09-26 DIAGNOSIS — I10 Essential (primary) hypertension: Secondary | ICD-10-CM | POA: Diagnosis not present

## 2020-09-26 DIAGNOSIS — R1310 Dysphagia, unspecified: Secondary | ICD-10-CM | POA: Diagnosis not present

## 2020-09-26 DIAGNOSIS — I700 Atherosclerosis of aorta: Secondary | ICD-10-CM | POA: Diagnosis not present

## 2020-09-26 DIAGNOSIS — R0781 Pleurodynia: Secondary | ICD-10-CM | POA: Diagnosis not present

## 2020-09-26 DIAGNOSIS — J449 Chronic obstructive pulmonary disease, unspecified: Secondary | ICD-10-CM | POA: Diagnosis not present

## 2020-09-26 DIAGNOSIS — M25511 Pain in right shoulder: Secondary | ICD-10-CM | POA: Diagnosis not present

## 2020-09-26 DIAGNOSIS — M19011 Primary osteoarthritis, right shoulder: Secondary | ICD-10-CM | POA: Diagnosis not present

## 2020-09-26 DIAGNOSIS — M1990 Unspecified osteoarthritis, unspecified site: Secondary | ICD-10-CM | POA: Diagnosis not present

## 2020-09-26 DIAGNOSIS — M5134 Other intervertebral disc degeneration, thoracic region: Secondary | ICD-10-CM | POA: Diagnosis not present

## 2020-09-26 DIAGNOSIS — Z043 Encounter for examination and observation following other accident: Secondary | ICD-10-CM | POA: Diagnosis not present

## 2020-09-26 DIAGNOSIS — M25562 Pain in left knee: Secondary | ICD-10-CM | POA: Diagnosis not present

## 2020-09-26 DIAGNOSIS — I959 Hypotension, unspecified: Secondary | ICD-10-CM | POA: Diagnosis not present

## 2020-09-27 DIAGNOSIS — M5134 Other intervertebral disc degeneration, thoracic region: Secondary | ICD-10-CM | POA: Diagnosis not present

## 2020-09-27 DIAGNOSIS — R1310 Dysphagia, unspecified: Secondary | ICD-10-CM | POA: Diagnosis not present

## 2020-09-27 DIAGNOSIS — M1990 Unspecified osteoarthritis, unspecified site: Secondary | ICD-10-CM | POA: Diagnosis not present

## 2020-09-27 DIAGNOSIS — I10 Essential (primary) hypertension: Secondary | ICD-10-CM | POA: Diagnosis not present

## 2020-09-27 DIAGNOSIS — E785 Hyperlipidemia, unspecified: Secondary | ICD-10-CM | POA: Diagnosis not present

## 2020-09-27 DIAGNOSIS — J449 Chronic obstructive pulmonary disease, unspecified: Secondary | ICD-10-CM | POA: Diagnosis not present

## 2020-09-27 DIAGNOSIS — I959 Hypotension, unspecified: Secondary | ICD-10-CM | POA: Diagnosis not present

## 2020-10-02 DIAGNOSIS — I959 Hypotension, unspecified: Secondary | ICD-10-CM | POA: Diagnosis not present

## 2020-10-02 DIAGNOSIS — M1990 Unspecified osteoarthritis, unspecified site: Secondary | ICD-10-CM | POA: Diagnosis not present

## 2020-10-02 DIAGNOSIS — I10 Essential (primary) hypertension: Secondary | ICD-10-CM | POA: Diagnosis not present

## 2020-10-02 DIAGNOSIS — J449 Chronic obstructive pulmonary disease, unspecified: Secondary | ICD-10-CM | POA: Diagnosis not present

## 2020-10-02 DIAGNOSIS — R1310 Dysphagia, unspecified: Secondary | ICD-10-CM | POA: Diagnosis not present

## 2020-10-02 DIAGNOSIS — E785 Hyperlipidemia, unspecified: Secondary | ICD-10-CM | POA: Diagnosis not present

## 2020-10-02 DIAGNOSIS — M5134 Other intervertebral disc degeneration, thoracic region: Secondary | ICD-10-CM | POA: Diagnosis not present

## 2020-10-05 DIAGNOSIS — J449 Chronic obstructive pulmonary disease, unspecified: Secondary | ICD-10-CM | POA: Diagnosis not present

## 2020-10-05 DIAGNOSIS — M1990 Unspecified osteoarthritis, unspecified site: Secondary | ICD-10-CM | POA: Diagnosis not present

## 2020-10-05 DIAGNOSIS — M5134 Other intervertebral disc degeneration, thoracic region: Secondary | ICD-10-CM | POA: Diagnosis not present

## 2020-10-05 DIAGNOSIS — E785 Hyperlipidemia, unspecified: Secondary | ICD-10-CM | POA: Diagnosis not present

## 2020-10-05 DIAGNOSIS — I10 Essential (primary) hypertension: Secondary | ICD-10-CM | POA: Diagnosis not present

## 2020-10-05 DIAGNOSIS — I959 Hypotension, unspecified: Secondary | ICD-10-CM | POA: Diagnosis not present

## 2020-10-05 DIAGNOSIS — R1310 Dysphagia, unspecified: Secondary | ICD-10-CM | POA: Diagnosis not present

## 2020-10-06 DIAGNOSIS — R1310 Dysphagia, unspecified: Secondary | ICD-10-CM | POA: Diagnosis not present

## 2020-10-06 DIAGNOSIS — J449 Chronic obstructive pulmonary disease, unspecified: Secondary | ICD-10-CM | POA: Diagnosis not present

## 2020-10-06 DIAGNOSIS — M5134 Other intervertebral disc degeneration, thoracic region: Secondary | ICD-10-CM | POA: Diagnosis not present

## 2020-10-06 DIAGNOSIS — E785 Hyperlipidemia, unspecified: Secondary | ICD-10-CM | POA: Diagnosis not present

## 2020-10-06 DIAGNOSIS — I10 Essential (primary) hypertension: Secondary | ICD-10-CM | POA: Diagnosis not present

## 2020-10-06 DIAGNOSIS — M1990 Unspecified osteoarthritis, unspecified site: Secondary | ICD-10-CM | POA: Diagnosis not present

## 2020-10-06 DIAGNOSIS — I959 Hypotension, unspecified: Secondary | ICD-10-CM | POA: Diagnosis not present

## 2020-10-10 DIAGNOSIS — I10 Essential (primary) hypertension: Secondary | ICD-10-CM | POA: Diagnosis not present

## 2020-10-10 DIAGNOSIS — I959 Hypotension, unspecified: Secondary | ICD-10-CM | POA: Diagnosis not present

## 2020-10-10 DIAGNOSIS — E785 Hyperlipidemia, unspecified: Secondary | ICD-10-CM | POA: Diagnosis not present

## 2020-10-10 DIAGNOSIS — R1310 Dysphagia, unspecified: Secondary | ICD-10-CM | POA: Diagnosis not present

## 2020-10-10 DIAGNOSIS — M1990 Unspecified osteoarthritis, unspecified site: Secondary | ICD-10-CM | POA: Diagnosis not present

## 2020-10-10 DIAGNOSIS — M5134 Other intervertebral disc degeneration, thoracic region: Secondary | ICD-10-CM | POA: Diagnosis not present

## 2020-10-10 DIAGNOSIS — J449 Chronic obstructive pulmonary disease, unspecified: Secondary | ICD-10-CM | POA: Diagnosis not present

## 2020-10-11 DIAGNOSIS — I959 Hypotension, unspecified: Secondary | ICD-10-CM | POA: Diagnosis not present

## 2020-10-11 DIAGNOSIS — E785 Hyperlipidemia, unspecified: Secondary | ICD-10-CM | POA: Diagnosis not present

## 2020-10-11 DIAGNOSIS — M1990 Unspecified osteoarthritis, unspecified site: Secondary | ICD-10-CM | POA: Diagnosis not present

## 2020-10-11 DIAGNOSIS — J449 Chronic obstructive pulmonary disease, unspecified: Secondary | ICD-10-CM | POA: Diagnosis not present

## 2020-10-11 DIAGNOSIS — M5134 Other intervertebral disc degeneration, thoracic region: Secondary | ICD-10-CM | POA: Diagnosis not present

## 2020-10-11 DIAGNOSIS — R1310 Dysphagia, unspecified: Secondary | ICD-10-CM | POA: Diagnosis not present

## 2020-10-11 DIAGNOSIS — I10 Essential (primary) hypertension: Secondary | ICD-10-CM | POA: Diagnosis not present

## 2020-10-12 DIAGNOSIS — M1990 Unspecified osteoarthritis, unspecified site: Secondary | ICD-10-CM | POA: Diagnosis not present

## 2020-10-12 DIAGNOSIS — E785 Hyperlipidemia, unspecified: Secondary | ICD-10-CM | POA: Diagnosis not present

## 2020-10-12 DIAGNOSIS — J449 Chronic obstructive pulmonary disease, unspecified: Secondary | ICD-10-CM | POA: Diagnosis not present

## 2020-10-12 DIAGNOSIS — R1310 Dysphagia, unspecified: Secondary | ICD-10-CM | POA: Diagnosis not present

## 2020-10-12 DIAGNOSIS — M5134 Other intervertebral disc degeneration, thoracic region: Secondary | ICD-10-CM | POA: Diagnosis not present

## 2020-10-12 DIAGNOSIS — I959 Hypotension, unspecified: Secondary | ICD-10-CM | POA: Diagnosis not present

## 2020-10-12 DIAGNOSIS — I10 Essential (primary) hypertension: Secondary | ICD-10-CM | POA: Diagnosis not present

## 2020-10-13 DIAGNOSIS — M5134 Other intervertebral disc degeneration, thoracic region: Secondary | ICD-10-CM | POA: Diagnosis not present

## 2020-10-13 DIAGNOSIS — R1310 Dysphagia, unspecified: Secondary | ICD-10-CM | POA: Diagnosis not present

## 2020-10-13 DIAGNOSIS — M1990 Unspecified osteoarthritis, unspecified site: Secondary | ICD-10-CM | POA: Diagnosis not present

## 2020-10-13 DIAGNOSIS — I959 Hypotension, unspecified: Secondary | ICD-10-CM | POA: Diagnosis not present

## 2020-10-13 DIAGNOSIS — E785 Hyperlipidemia, unspecified: Secondary | ICD-10-CM | POA: Diagnosis not present

## 2020-10-13 DIAGNOSIS — J449 Chronic obstructive pulmonary disease, unspecified: Secondary | ICD-10-CM | POA: Diagnosis not present

## 2020-10-13 DIAGNOSIS — I10 Essential (primary) hypertension: Secondary | ICD-10-CM | POA: Diagnosis not present

## 2020-10-16 DIAGNOSIS — K5900 Constipation, unspecified: Secondary | ICD-10-CM | POA: Diagnosis not present

## 2020-10-16 DIAGNOSIS — R1310 Dysphagia, unspecified: Secondary | ICD-10-CM | POA: Diagnosis not present

## 2020-10-16 NOTE — Progress Notes (Signed)
Cardiology Office Note    Date:  10/18/2020   ID:  [name], [name] [birth year], MRN [id number]   PCP:  [name], MD   [place]  Cardiologist:  Sinclair Grooms, MD   Advanced Practice Provider:  No care team member to display Electrophysiologist:  None   [phone]   Chief Complaint  Patient presents with   Follow-up     History of Present Illness:  [name] [name] is a 80 y.o. male  with history of CAD with CTO RCA, HTN, HLD, obesity, OSA, DM2, CKD, memory loss, tobacco abuse.   Patient last saw Dr. [name] 04/23/19 and was having occasional angina relieved with NTG. Ranexa increased and advised to quit smoking. Metoprolol had been decreased by PCP for hypotension and weak spells which could have contributed to angina.   Patient discharged 08/09/20 after admission with sepsis/CAP, hypotension, AKI.  He continues to have trouble with dysphagia, endo consistent with achalasia.  I saw the patient 08/16/2020 and he continued to have some angina.  Blood pressure was coming up so I restarted Imdur 60 mg once at night.  He was having trouble drinking water. Patient's daughter called in 09/26/2020 complaining of her father being dizzy and lightheaded on occasion with change of position and also weight loss from 240 pounds down to 170 pounds.  Dr. [name] reviewed and asked him to follow-up with PCP to assess weight loss and exclude dehydration.  Patient comes in with his daughter. He saw Dr. Felipa Eth this am and he reduced wellbutrin 100 mg at night to see if that helps. Weight coming back up 183 lbs. Eating better and staying hydrated. Drinking some ensure. Chest pain has gotten better. Now not occurring at rest and only with exertion-going up stairs and relieved with NTG. Use NTG before meals for esophageal spasm. Has stopped smoking for the most part but when he does get one he gets nausea, dizzy. Dizzy when he first gets up or gets up when taking a nap.PT is  helping him with balance.      Past Medical History:  Diagnosis Date   Adenomatous polyp 2007   Anginal pain ([place])    Chronic    Anxiety    Arthritis    "my body"   B12 deficiency    BPH (benign prostatic hyperplasia)    Chronic back pain    "all over"   Chronic low back pain    COPD (chronic obstructive pulmonary disease) (HCC)    Coronary artery disease 2009   DDD (degenerative disc disease), cervical    DDD (degenerative disc disease), lumbosacral    DDD (degenerative disc disease), thoracic    Depression    Gastroesophageal reflux    Gynecomastia 06/2009   History of kidney stones    Hyperglycemia    Hyperlipidemia LDL goal < 70    MI (myocardial infarction) ([place]) 08/1989; 1993   OSA on CPAP    "have mask; don't wear it" (02/02/2014) 09/25/15- now has a BiPAp- doesn'tt use it   Peptic ulcer disease 1959   Pre-diabetes    Right bundle branch block    Tobacco abuse    Vertebral compression fracture ([place]) 1970    Past Surgical History:  Procedure Laterality Date   BOTOX INJECTION  08/08/2020   Procedure: BOTOX INJECTION;  Surgeon: Clarene Essex, MD;  Location: [facility] ENDOSCOPY;  Service: Endoscopy;;   BYPASS GRAFT POPLITEAL TO POPLITEAL Left 06/2005   Dr Donnetta Hutching  BYPASS GRAFT POPLITEAL TO POPLITEAL Right 02/2001   Dr Deon Pilling   CARDIAC CATHETERIZATION  1991; 1993; 08/2012; 11/2013; 02/02/2014   I've had 6-7 total   CARDIAC CATHETERIZATION  02/02/2014   Procedure: LEFT HEART CATH AND CORONARY ANGIOGRAPHY;  Surgeon: Jettie Booze, MD;  mild disease in LAD/CFX, RCA w/ CTO, unsuccessfull PCI both w/ antegrade and retrograde approaches    CARPAL TUNNEL RELEASE Left 06/01/2019   Procedure: LEFT CARPAL TUNNEL RELEASE;  Surgeon: Daryll Brod, MD;  Location: [place];  Service: Orthopedics;  Laterality: Left;  FOREARM BLOCK   CATARACT EXTRACTION W/ INTRAOCULAR LENS  IMPLANT, BILATERAL Bilateral ~ 2012   COLONOSCOPY W/ POLYPECTOMY     CORONARY ANGIOPLASTY  1990's    CYSTOSCOPY WITH STENT PLACEMENT Right 11/19/2019   Procedure: CYSTOSCOPY WITH RIGHT URETERAL STENT PLACEMENT;  Surgeon: Lucas Mallow, MD;  Location: [place];  Service: Urology;  Laterality: Right;   CYSTOSCOPY/URETEROSCOPY/HOLMIUM LASER/STENT PLACEMENT Right 12/22/2019   Procedure: CYSTOSCOPY RIGHT URETEROSCOPY/HOLMIUM LASER/STENT PLACEMENT;  Surgeon: Ardis Hughs, MD;  Location: [facility];  Service: Urology;  Laterality: Right;   ESOPHAGOGASTRODUODENOSCOPY (EGD) WITH ESOPHAGEAL DILATION  11/2007   ESOPHAGOGASTRODUODENOSCOPY (EGD) WITH PROPOFOL N/A 09/28/2015   Procedure: ESOPHAGOGASTRODUODENOSCOPY (EGD) WITH PROPOFOL;  Surgeon: Clarene Essex, MD;  Location[facility] ENDOSCOPY;  Service: Endoscopy;  Laterality: N/A;   ESOPHAGOGASTRODUODENOSCOPY (EGD) WITH PROPOFOL N/A 09/09/2017   Procedure: ESOPHAGOGASTRODUODENOSCOPY (EGD) WITH PROPOFOL;  Surgeon: Clarene Essex, MD;  Location: [facility] ENDOSCOPY;  Service: Endoscopy;  Laterality: N/A;   ESOPHAGOGASTRODUODENOSCOPY (EGD) WITH PROPOFOL N/A 03/09/2020   Procedure: ESOPHAGOGASTRODUODENOSCOPY (EGD) WITH PROPOFOL WITH DIL;  Surgeon: Clarene Essex, MD;  Location: [facility] END[facility];  Service: Endoscopy;  Laterality: N/A;   ESOPHAGOGASTRODUODENOSCOPY (EGD) WITH PROPOFOL N/A 08/08/2020   Procedure: ESOPHAGOGASTRODUODENOSCOPY (EGD) WITH PROPOFOL;  Surgeon: Clarene Essex, MD;  Location: [place];  Service: Endoscopy;  Laterality: N/A;  Probable Botox injection, possible dilatation   EYE SURGERY Bilateral    JOINT REPLACEMENT     LEFT HEART CATH AND CORONARY ANGIOGRAPHY N/A 08/10/2018   Procedure: LEFT HEART CATH AND CORONARY ANGIOGRAPHY;  Surgeon: Troy Sine, MD;  Location: [place] CV LAB;  Service: Cardiovascular;  Laterality: N/A;   LEFT HEART CATHETERIZATION WITH CORONARY ANGIOGRAM N/A 09/03/2012   Procedure: LEFT HEART CATHETERIZATION WITH CORONARY ANGIOGRAM;  Surgeon: Sinclair Grooms, MD;  Location: [facility] CATH LAB;  Service: Cardiovascular;  Laterality: N/A;   LEFT  HEART CATHETERIZATION WITH CORONARY ANGIOGRAM N/A 12/01/2013   Procedure: LEFT HEART CATHETERIZATION WITH CORONARY ANGIOGRAM;  Surgeon: Sinclair Grooms, MD;  Location: [facility] CATH LAB;  Service: Cardiovascular;  Laterality: N/A;   LOWER EXTREMITY ANGIOGRAM Left 05/2005   L pop-pop BPG patent   NAILBED REPAIR Right 09/17/2016   Procedure: ABLATION NAILBED RIGHT INDEX;  Surgeon: Daryll Brod, MD;  Location: [place];  Service: Orthopedics;  Laterality: Right;   NASAL SEPTUM SURGERY  1977   PENILE PROSTHESIS IMPLANT  1990's   SAVORY DILATION N/A 09/28/2015   Procedure: SAVORY DILATION;  Surgeon: Clarene Essex, MD;  Location: [facility] ENDOSCOPY;  Service: Endoscopy;  Laterality: N/A;  have balloons available   SAVORY DILATION N/A 09/09/2017   Procedure: SAVORY DILATION;  Surgeon: Clarene Essex, MD;  Location: [facility] ENDOSCOPY;  Service: Endoscopy;  Laterality: N/A;   SAVORY DILATION N/A 03/09/2020   Procedure: SAVORY DILATION;  Surgeon: Clarene Essex, MD;  Location: [facility] ENDOSCOPY;  Service: Endoscopy;  Laterality: N/A;   SCROTAL SURGERY  08/1999   pump  revision/notes 07/11/2010   SKIN FULL THICKNESS GRAFT Right 09/17/2016   Procedure: SKIN GRAFT FROM UPPER ARM;  Surgeon: Daryll Brod, MD;  Location: [place];  Service: Orthopedics;  Laterality: Right;   TOTAL KNEE ARTHROPLASTY Left 01/2009    Current Medications: Current Meds  Medication Sig   aspirin EC 81 MG tablet Take 1 tablet (81 mg total) by mouth daily. Resume after 4 days.   buPROPion (WELLBUTRIN) 100 MG tablet Take 100 mg by mouth at bedtime.   Cholecalciferol (VITAMIN D3) 50 MCG (2000 UT) TABS Take 2,000 Units by mouth every morning.   donepezil (ARICEPT) 5 MG tablet Take 5 mg by mouth at bedtime.   famotidine (PEPCID) 10 MG tablet Take 10 mg by mouth 2 (two) times daily.   finasteride (PROSCAR) 5 MG tablet Take 5 mg by mouth at bedtime.   HYDROcodone-acetaminophen (NORCO/VICODIN) 5-325 MG tablet Take 1 tablet by mouth every 6  (six) hours as needed for severe pain.   isosorbide mononitrate (IMDUR) 30 MG 24 hr tablet Take 1 tablet (30 mg total) by mouth in the morning and at bedtime.   nitroGLYCERIN (NITROSTAT) 0.4 MG SL tablet Place 0.4 mg under the tongue See admin instructions. Dissolve 0.4 mg sublingually 10 minutes before each meal for esophageal spasms and as needed for chest pain   ondansetron (ZOFRAN) 4 MG tablet Take 4 mg by mouth every 8 (eight) hours as needed for nausea or vomiting.   pantoprazole (PROTONIX) 40 MG tablet Take 40 mg by mouth 2 (two) times daily with a meal.   polyethylene glycol powder (GLYCOLAX/MIRALAX) 17 GM/SCOOP powder Take 17 g by mouth daily as needed for mild constipation (MIX AND DRINK).   ranolazine (RANEXA) 1000 MG SR tablet TAKE ONE TABLET BY MOUTH TWICE DAILY   rosuvastatin (CRESTOR) 10 MG tablet Take 10 mg by mouth every morning.   vitamin B-12 (CYANOCOBALAMIN) 1000 MCG tablet Take 1,000 mcg by mouth every morning.   [DISCONTINUED] isosorbide mononitrate (IMDUR) 60 MG 24 hr tablet Take 1 tablet (60 mg total) by mouth daily.     Allergies:   Doxycycline, Flomax [tamsulosin], Lipitor [atorvastatin], Rhubarb, Sulfa antibiotics, Sulfacetamide sodium, Clindamycin/lincomycin, Other, and Vytorin [ezetimibe-simvastatin]   Social History   Socioeconomic History   Marital status: Widowed    Spouse name: Not on file   Number of children: Not on file   Years of education: Not on file   Highest education level: Not on file  Occupational History   Not on file  Tobacco Use   Smoking status: Former    Packs/day: 0.25    Years: 59.00    Pack years: 14.75    Types: Cigarettes   Smokeless tobacco: Former    Types: Chew   Tobacco comments:    patient states quit around month ago(december 2021)  Vaping Use   Vaping Use: Never used  Substance and Sexual Activity   Alcohol use: No    Alcohol/week: 0.0 standard drinks   Drug use: No   Sexual activity: Not on file  Other Topics  Concern   Not on file  Social History Narrative   Tobacco use cigarettes: former smoker, quit in year 2014. Pack-year Hx: 12, tobacco history last updated 05/12/2013, additional findings: tobacco user moderate cigarette smoker (10-19 cigs/day), additional findings: tobacco Non-User current non-smoker, no smoking. No alcohol, stopped 1981. 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 Marital status: married. 4 children.   Social Determinants of Health  Financial Resource Strain: Not on file  Food Insecurity: Not on file  Transportation Needs: Not on file  Physical Activity: Not on file  Stress: Not on file  Social Connections: Not on file     Family History:  The patient's  family history includes Alzheimer's disease (age of onset: 6) in his mother; Diabetes in his daughter; Diabetes (age of onset: 3) in his father; Fibromyalgia (age of onset: 67) in his sister; Hypertension in his son; Obesity in his daughter; Peripheral vascular disease in his father; Pneumonia in his father.   ROS:   Please see the history of present illness.    ROS All other systems reviewed and are negative.   PHYSICAL EXAM:   VS:  BP 126/60   Pulse 70   Ht '5\' 9"'$  (1.753 m)   Wt 183 lb 12.8 oz (83.4 kg)   SpO2 98%   BMI 27.14 kg/m   Physical Exam  GEN: Well nourished, well developed, in no acute distress  Neck: no JVD, carotid bruits, or masses Cardiac:RRR; 2/6 systolic murmur LSB Respiratory:  clear to auscultation bilaterally, normal work of breathing GI: soft, nontender, nondistended, + BS Ext: without cyanosis, clubbing, or edema, Good distal pulses bilaterally Neuro:  Alert and Oriented x 3 Psych: euthymic mood, full affect  Wt Readings from Last 3 Encounters:  10/18/20 183 lb 12.8 oz (83.4 kg)  08/16/20 176 lb 6.4 oz (80 kg)  08/07/20 179 lb 14.3 oz (81.6 kg)      Studies/Labs Reviewed:   EKG:  EKG is not ordered today.     Recent  Labs: 08/05/2020: ALT 17; B Natriuretic Peptide 382.2; Magnesium 2.1 08/09/2020: BUN 20; Creatinine, Ser 1.46; Hemoglobin 11.6; Platelets 111; Potassium 3.4; Sodium 135   Lipid Panel    Component Value Date/Time   CHOL 125 07/30/2020 2039   TRIG 109 07/30/2020 2039   HDL 44 07/30/2020 2039   CHOLHDL 2.8 07/30/2020 2039   VLDL 22 07/30/2020 2039   LDLCALC 59 07/30/2020 2039    Additional studies/ records that were reviewed today include:  Coronary angiography 2020: Diagnostic Dominance: Right       Risk Assessment/Calculations:         ASSESSMENT:    1. Coronary artery disease involving native coronary artery of native heart, unspecified whether angina present   2. Essential hypertension   3. Orthostatic hypotension   4. Hyperlipidemia, unspecified hyperlipidemia type   5. Stage 3 chronic kidney disease, unspecified whether stage 3a or 3b CKD ([place])   6. Weight loss      PLAN:  In order of problems listed above:  CAD with CTO of the RCA on cath in 2020 recent increase in angina and Imdur increased to 60 mg daily.  Blood pressures run low so off Toprol.  He is also on Ranexa.  Chest pain has improved some as he is not having it at rest anymore but he does get it when he goes upstairs to his bedroom.  Relieved with rest in about 15 minutes or with the sublingual nitroglycerin.  Still having episodes of hypotension and dizzy when he gets up in the morning.  We will try to take Imdur 30 mg in the morning and afternoon rather than 60 at night.  Essential hypertension with recent hypotension patient saw Dr. Felipa Eth this morning and decreased his Wellbutrin 200 mg nightly to see if this would help his hypotension.  He is also eating better and staying hydrated which is helped his blood  pressure come up some.  Patient also takes sublingual nitroglycerin before meals for esophageal spasm  Hyperlipidemia LDL 59 on 07/30/2020   CKD creatinine 1.59 in June  Weight loss from GI  problems and esophageal spasm.  Is improving and has had gained some weight back  Shared Decision Making/Informed Consent        Medication Adjustments/Labs and Tests Ordered: Current medicines are reviewed at length with the patient today.  Concerns regarding medicines are outlined above.  Medication changes, Labs and Tests ordered today are listed in the Patient Instructions below. Patient Instructions  Medication Instructions:  Your physician has recommended you make the following change in your medication:    WE ARE CHANGING THE Imdur to 30 mg taking 1 tablet twice a day   *If you need a refill on your cardiac medications before your next appointment, please call your pharmacy*   Lab Work: None ordered  If you have labs (blood work) drawn today and your tests are completely normal, you will receive your results only by: [place] (if you have MyChart) OR A paper copy in the mail If you have any lab test that is abnormal or we need to change your treatment, we will call you to review the results.   Testing/Procedures: None ordered   Follow-Up: At [facility], you and your health needs are our priority.  As part of our continuing mission to provide you with exceptional heart care, we have created designated Provider Care Teams.  These Care Teams include your primary Cardiologist (physician) and Advanced Practice Providers (APPs -  Physician Assistants and Nurse Practitioners) who all work together to provide you with the care you need, when you need it.  We recommend signing up for the patient portal called "MyChart".  Sign up information is provided on this After Visit Summary.  MyChart is used to connect with patients for Virtual Visits (Telemedicine).  Patients are able to view lab/test results, encounter notes, upcoming appointments, etc.  Non-urgent messages can be sent to your provider as well.   To learn more about what you can do with MyChart, go to  [email or web address].    Your next appointment:   3 month(s)  The format for your next appointment:   In Person  Provider:   You may see Sinclair Grooms, MD or one of the following Advanced Practice Providers on your designated Care Team:   Cecilie Kicks, NP    Other Instructions    Signed, Ermalinda Barrios, PA-C  10/18/2020 11:21 AM    [address], [place], [place]  [address] Phone: [phone]; Fax: [phone]

## 2020-10-18 ENCOUNTER — Other Ambulatory Visit: Payer: Self-pay

## 2020-10-18 ENCOUNTER — Ambulatory Visit: Payer: Medicare Other | Admitting: Physician Assistant

## 2020-10-18 ENCOUNTER — Encounter: Payer: Self-pay | Admitting: Physician Assistant

## 2020-10-18 VITALS — BP 126/60 | HR 70 | Ht 69.0 in | Wt 183.8 lb

## 2020-10-18 DIAGNOSIS — M1990 Unspecified osteoarthritis, unspecified site: Secondary | ICD-10-CM | POA: Diagnosis not present

## 2020-10-18 DIAGNOSIS — N183 Chronic kidney disease, stage 3 unspecified: Secondary | ICD-10-CM

## 2020-10-18 DIAGNOSIS — I10 Essential (primary) hypertension: Secondary | ICD-10-CM | POA: Diagnosis not present

## 2020-10-18 DIAGNOSIS — I951 Orthostatic hypotension: Secondary | ICD-10-CM

## 2020-10-18 DIAGNOSIS — M5134 Other intervertebral disc degeneration, thoracic region: Secondary | ICD-10-CM | POA: Diagnosis not present

## 2020-10-18 DIAGNOSIS — R1310 Dysphagia, unspecified: Secondary | ICD-10-CM | POA: Diagnosis not present

## 2020-10-18 DIAGNOSIS — I2510 Atherosclerotic heart disease of native coronary artery without angina pectoris: Secondary | ICD-10-CM

## 2020-10-18 DIAGNOSIS — R634 Abnormal weight loss: Secondary | ICD-10-CM | POA: Diagnosis not present

## 2020-10-18 DIAGNOSIS — E785 Hyperlipidemia, unspecified: Secondary | ICD-10-CM

## 2020-10-18 DIAGNOSIS — J449 Chronic obstructive pulmonary disease, unspecified: Secondary | ICD-10-CM | POA: Diagnosis not present

## 2020-10-18 DIAGNOSIS — I959 Hypotension, unspecified: Secondary | ICD-10-CM | POA: Diagnosis not present

## 2020-10-18 MED ORDER — ISOSORBIDE MONONITRATE ER 30 MG PO TB24: 30.0000 mg | ORAL_TABLET | Freq: Two times a day (BID) | ORAL | 3 refills | Status: DC

## 2020-10-18 NOTE — Patient Instructions (Addendum)
Medication Instructions:  Your physician has recommended you make the following change in your medication:    WE ARE CHANGING THE Imdur to 30 mg taking 1 tablet twice a day   *If you need a refill on your cardiac medications before your next appointment, please call your pharmacy*   Lab Work: None ordered  If you have labs (blood work) drawn today and your tests are completely normal, you will receive your results only by: [place] (if you have MyChart) OR A paper copy in the mail If you have any lab test that is abnormal or we need to change your treatment, we will call you to review the results.   Testing/Procedures: None ordered   Follow-Up: At [facility], you and your health needs are our priority.  As part of our continuing mission to provide you with exceptional heart care, we have created designated Provider Care Teams.  These Care Teams include your primary Cardiologist (physician) and Advanced Practice Providers (APPs -  Physician Assistants and Nurse Practitioners) who all work together to provide you with the care you need, when you need it.  We recommend signing up for the patient portal called "MyChart".  Sign up information is provided on this After Visit Summary.  MyChart is used to connect with patients for Virtual Visits (Telemedicine).  Patients are able to view lab/test results, encounter notes, upcoming appointments, etc.  Non-urgent messages can be sent to your provider as well.   To learn more about what you can do with MyChart, go to [email or web address].    Your next appointment:   3 month(s)  The format for your next appointment:   In Person  Provider:   You may see Sinclair Grooms, MD or one of the following Advanced Practice Providers on your designated Care Team:   Cecilie Kicks, NP    Other Instructions

## 2020-10-20 DIAGNOSIS — E785 Hyperlipidemia, unspecified: Secondary | ICD-10-CM | POA: Diagnosis not present

## 2020-10-20 DIAGNOSIS — I959 Hypotension, unspecified: Secondary | ICD-10-CM | POA: Diagnosis not present

## 2020-10-20 DIAGNOSIS — J449 Chronic obstructive pulmonary disease, unspecified: Secondary | ICD-10-CM | POA: Diagnosis not present

## 2020-10-20 DIAGNOSIS — M1990 Unspecified osteoarthritis, unspecified site: Secondary | ICD-10-CM | POA: Diagnosis not present

## 2020-10-20 DIAGNOSIS — M5134 Other intervertebral disc degeneration, thoracic region: Secondary | ICD-10-CM | POA: Diagnosis not present

## 2020-10-20 DIAGNOSIS — R1310 Dysphagia, unspecified: Secondary | ICD-10-CM | POA: Diagnosis not present

## 2020-10-20 DIAGNOSIS — I10 Essential (primary) hypertension: Secondary | ICD-10-CM | POA: Diagnosis not present

## 2020-10-23 DIAGNOSIS — I959 Hypotension, unspecified: Secondary | ICD-10-CM | POA: Diagnosis not present

## 2020-10-23 DIAGNOSIS — M5134 Other intervertebral disc degeneration, thoracic region: Secondary | ICD-10-CM | POA: Diagnosis not present

## 2020-10-23 DIAGNOSIS — E785 Hyperlipidemia, unspecified: Secondary | ICD-10-CM | POA: Diagnosis not present

## 2020-10-23 DIAGNOSIS — M1990 Unspecified osteoarthritis, unspecified site: Secondary | ICD-10-CM | POA: Diagnosis not present

## 2020-10-23 DIAGNOSIS — I10 Essential (primary) hypertension: Secondary | ICD-10-CM | POA: Diagnosis not present

## 2020-10-23 DIAGNOSIS — J449 Chronic obstructive pulmonary disease, unspecified: Secondary | ICD-10-CM | POA: Diagnosis not present

## 2020-10-23 DIAGNOSIS — R1310 Dysphagia, unspecified: Secondary | ICD-10-CM | POA: Diagnosis not present

## 2020-10-26 DIAGNOSIS — I10 Essential (primary) hypertension: Secondary | ICD-10-CM | POA: Diagnosis not present

## 2020-10-26 DIAGNOSIS — I959 Hypotension, unspecified: Secondary | ICD-10-CM | POA: Diagnosis not present

## 2020-10-26 DIAGNOSIS — M5134 Other intervertebral disc degeneration, thoracic region: Secondary | ICD-10-CM | POA: Diagnosis not present

## 2020-10-26 DIAGNOSIS — J449 Chronic obstructive pulmonary disease, unspecified: Secondary | ICD-10-CM | POA: Diagnosis not present

## 2020-10-26 DIAGNOSIS — E785 Hyperlipidemia, unspecified: Secondary | ICD-10-CM | POA: Diagnosis not present

## 2020-10-26 DIAGNOSIS — M1990 Unspecified osteoarthritis, unspecified site: Secondary | ICD-10-CM | POA: Diagnosis not present

## 2020-10-26 DIAGNOSIS — R1310 Dysphagia, unspecified: Secondary | ICD-10-CM | POA: Diagnosis not present

## 2020-10-27 DIAGNOSIS — J449 Chronic obstructive pulmonary disease, unspecified: Secondary | ICD-10-CM | POA: Diagnosis not present

## 2020-10-27 DIAGNOSIS — M1990 Unspecified osteoarthritis, unspecified site: Secondary | ICD-10-CM | POA: Diagnosis not present

## 2020-10-27 DIAGNOSIS — I959 Hypotension, unspecified: Secondary | ICD-10-CM | POA: Diagnosis not present

## 2020-10-27 DIAGNOSIS — I10 Essential (primary) hypertension: Secondary | ICD-10-CM | POA: Diagnosis not present

## 2020-10-27 DIAGNOSIS — E785 Hyperlipidemia, unspecified: Secondary | ICD-10-CM | POA: Diagnosis not present

## 2020-10-27 DIAGNOSIS — M5134 Other intervertebral disc degeneration, thoracic region: Secondary | ICD-10-CM | POA: Diagnosis not present

## 2020-10-27 DIAGNOSIS — R1310 Dysphagia, unspecified: Secondary | ICD-10-CM | POA: Diagnosis not present

## 2020-10-30 DIAGNOSIS — J449 Chronic obstructive pulmonary disease, unspecified: Secondary | ICD-10-CM | POA: Diagnosis not present

## 2020-10-30 DIAGNOSIS — M1990 Unspecified osteoarthritis, unspecified site: Secondary | ICD-10-CM | POA: Diagnosis not present

## 2020-10-30 DIAGNOSIS — M5134 Other intervertebral disc degeneration, thoracic region: Secondary | ICD-10-CM | POA: Diagnosis not present

## 2020-10-30 DIAGNOSIS — R1310 Dysphagia, unspecified: Secondary | ICD-10-CM | POA: Diagnosis not present

## 2020-10-30 DIAGNOSIS — I959 Hypotension, unspecified: Secondary | ICD-10-CM | POA: Diagnosis not present

## 2020-10-30 DIAGNOSIS — I10 Essential (primary) hypertension: Secondary | ICD-10-CM | POA: Diagnosis not present

## 2020-10-30 DIAGNOSIS — E785 Hyperlipidemia, unspecified: Secondary | ICD-10-CM | POA: Diagnosis not present

## 2020-11-01 DIAGNOSIS — I959 Hypotension, unspecified: Secondary | ICD-10-CM | POA: Diagnosis not present

## 2020-11-01 DIAGNOSIS — M5134 Other intervertebral disc degeneration, thoracic region: Secondary | ICD-10-CM | POA: Diagnosis not present

## 2020-11-01 DIAGNOSIS — J449 Chronic obstructive pulmonary disease, unspecified: Secondary | ICD-10-CM | POA: Diagnosis not present

## 2020-11-01 DIAGNOSIS — E785 Hyperlipidemia, unspecified: Secondary | ICD-10-CM | POA: Diagnosis not present

## 2020-11-01 DIAGNOSIS — M1990 Unspecified osteoarthritis, unspecified site: Secondary | ICD-10-CM | POA: Diagnosis not present

## 2020-11-01 DIAGNOSIS — I10 Essential (primary) hypertension: Secondary | ICD-10-CM | POA: Diagnosis not present

## 2020-11-01 DIAGNOSIS — R1310 Dysphagia, unspecified: Secondary | ICD-10-CM | POA: Diagnosis not present

## 2020-11-08 DIAGNOSIS — I959 Hypotension, unspecified: Secondary | ICD-10-CM | POA: Diagnosis not present

## 2020-11-08 DIAGNOSIS — J449 Chronic obstructive pulmonary disease, unspecified: Secondary | ICD-10-CM | POA: Diagnosis not present

## 2020-11-08 DIAGNOSIS — M1990 Unspecified osteoarthritis, unspecified site: Secondary | ICD-10-CM | POA: Diagnosis not present

## 2020-11-08 DIAGNOSIS — I10 Essential (primary) hypertension: Secondary | ICD-10-CM | POA: Diagnosis not present

## 2020-11-08 DIAGNOSIS — M5134 Other intervertebral disc degeneration, thoracic region: Secondary | ICD-10-CM | POA: Diagnosis not present

## 2020-11-08 DIAGNOSIS — R1310 Dysphagia, unspecified: Secondary | ICD-10-CM | POA: Diagnosis not present

## 2020-11-08 DIAGNOSIS — E785 Hyperlipidemia, unspecified: Secondary | ICD-10-CM | POA: Diagnosis not present

## 2020-11-09 DIAGNOSIS — I10 Essential (primary) hypertension: Secondary | ICD-10-CM | POA: Diagnosis not present

## 2020-11-09 DIAGNOSIS — M5134 Other intervertebral disc degeneration, thoracic region: Secondary | ICD-10-CM | POA: Diagnosis not present

## 2020-11-09 DIAGNOSIS — J449 Chronic obstructive pulmonary disease, unspecified: Secondary | ICD-10-CM | POA: Diagnosis not present

## 2020-11-09 DIAGNOSIS — I959 Hypotension, unspecified: Secondary | ICD-10-CM | POA: Diagnosis not present

## 2020-11-09 DIAGNOSIS — R1310 Dysphagia, unspecified: Secondary | ICD-10-CM | POA: Diagnosis not present

## 2020-11-09 DIAGNOSIS — E785 Hyperlipidemia, unspecified: Secondary | ICD-10-CM | POA: Diagnosis not present

## 2020-11-09 DIAGNOSIS — M1990 Unspecified osteoarthritis, unspecified site: Secondary | ICD-10-CM | POA: Diagnosis not present

## 2020-11-18 DIAGNOSIS — R300 Dysuria: Secondary | ICD-10-CM | POA: Diagnosis not present

## 2020-11-22 DIAGNOSIS — M1990 Unspecified osteoarthritis, unspecified site: Secondary | ICD-10-CM | POA: Diagnosis not present

## 2020-11-22 DIAGNOSIS — I10 Essential (primary) hypertension: Secondary | ICD-10-CM | POA: Diagnosis not present

## 2020-11-22 DIAGNOSIS — I959 Hypotension, unspecified: Secondary | ICD-10-CM | POA: Diagnosis not present

## 2020-11-22 DIAGNOSIS — R1310 Dysphagia, unspecified: Secondary | ICD-10-CM | POA: Diagnosis not present

## 2020-11-22 DIAGNOSIS — E785 Hyperlipidemia, unspecified: Secondary | ICD-10-CM | POA: Diagnosis not present

## 2020-11-22 DIAGNOSIS — J449 Chronic obstructive pulmonary disease, unspecified: Secondary | ICD-10-CM | POA: Diagnosis not present

## 2020-11-22 DIAGNOSIS — M5134 Other intervertebral disc degeneration, thoracic region: Secondary | ICD-10-CM | POA: Diagnosis not present

## 2020-12-07 DIAGNOSIS — I959 Hypotension, unspecified: Secondary | ICD-10-CM | POA: Diagnosis not present

## 2020-12-07 DIAGNOSIS — M5134 Other intervertebral disc degeneration, thoracic region: Secondary | ICD-10-CM | POA: Diagnosis not present

## 2020-12-07 DIAGNOSIS — J449 Chronic obstructive pulmonary disease, unspecified: Secondary | ICD-10-CM | POA: Diagnosis not present

## 2020-12-07 DIAGNOSIS — R1310 Dysphagia, unspecified: Secondary | ICD-10-CM | POA: Diagnosis not present

## 2020-12-07 DIAGNOSIS — M1990 Unspecified osteoarthritis, unspecified site: Secondary | ICD-10-CM | POA: Diagnosis not present

## 2020-12-07 DIAGNOSIS — I10 Essential (primary) hypertension: Secondary | ICD-10-CM | POA: Diagnosis not present

## 2020-12-07 DIAGNOSIS — E785 Hyperlipidemia, unspecified: Secondary | ICD-10-CM | POA: Diagnosis not present

## 2020-12-11 DIAGNOSIS — I959 Hypotension, unspecified: Secondary | ICD-10-CM | POA: Diagnosis not present

## 2020-12-11 DIAGNOSIS — R55 Syncope and collapse: Secondary | ICD-10-CM | POA: Diagnosis not present

## 2020-12-11 DIAGNOSIS — I10 Essential (primary) hypertension: Secondary | ICD-10-CM | POA: Diagnosis not present

## 2020-12-11 DIAGNOSIS — E785 Hyperlipidemia, unspecified: Secondary | ICD-10-CM | POA: Diagnosis not present

## 2020-12-11 DIAGNOSIS — M5134 Other intervertebral disc degeneration, thoracic region: Secondary | ICD-10-CM | POA: Diagnosis not present

## 2020-12-11 DIAGNOSIS — J449 Chronic obstructive pulmonary disease, unspecified: Secondary | ICD-10-CM | POA: Diagnosis not present

## 2020-12-11 DIAGNOSIS — M1990 Unspecified osteoarthritis, unspecified site: Secondary | ICD-10-CM | POA: Diagnosis not present

## 2020-12-11 DIAGNOSIS — Z79891 Long term (current) use of opiate analgesic: Secondary | ICD-10-CM | POA: Diagnosis not present

## 2020-12-14 DIAGNOSIS — M1990 Unspecified osteoarthritis, unspecified site: Secondary | ICD-10-CM | POA: Diagnosis not present

## 2020-12-14 DIAGNOSIS — E785 Hyperlipidemia, unspecified: Secondary | ICD-10-CM | POA: Diagnosis not present

## 2020-12-14 DIAGNOSIS — R55 Syncope and collapse: Secondary | ICD-10-CM | POA: Diagnosis not present

## 2020-12-14 DIAGNOSIS — I959 Hypotension, unspecified: Secondary | ICD-10-CM | POA: Diagnosis not present

## 2020-12-14 DIAGNOSIS — I10 Essential (primary) hypertension: Secondary | ICD-10-CM | POA: Diagnosis not present

## 2020-12-14 DIAGNOSIS — J449 Chronic obstructive pulmonary disease, unspecified: Secondary | ICD-10-CM | POA: Diagnosis not present

## 2020-12-14 DIAGNOSIS — M5134 Other intervertebral disc degeneration, thoracic region: Secondary | ICD-10-CM | POA: Diagnosis not present

## 2020-12-14 DIAGNOSIS — Z79891 Long term (current) use of opiate analgesic: Secondary | ICD-10-CM | POA: Diagnosis not present

## 2020-12-19 DIAGNOSIS — R55 Syncope and collapse: Secondary | ICD-10-CM | POA: Diagnosis not present

## 2020-12-19 DIAGNOSIS — E785 Hyperlipidemia, unspecified: Secondary | ICD-10-CM | POA: Diagnosis not present

## 2020-12-19 DIAGNOSIS — J449 Chronic obstructive pulmonary disease, unspecified: Secondary | ICD-10-CM | POA: Diagnosis not present

## 2020-12-19 DIAGNOSIS — M1990 Unspecified osteoarthritis, unspecified site: Secondary | ICD-10-CM | POA: Diagnosis not present

## 2020-12-19 DIAGNOSIS — M5134 Other intervertebral disc degeneration, thoracic region: Secondary | ICD-10-CM | POA: Diagnosis not present

## 2020-12-19 DIAGNOSIS — I10 Essential (primary) hypertension: Secondary | ICD-10-CM | POA: Diagnosis not present

## 2020-12-19 DIAGNOSIS — I959 Hypotension, unspecified: Secondary | ICD-10-CM | POA: Diagnosis not present

## 2020-12-19 DIAGNOSIS — Z79891 Long term (current) use of opiate analgesic: Secondary | ICD-10-CM | POA: Diagnosis not present

## 2020-12-20 DIAGNOSIS — M5030 Other cervical disc degeneration, unspecified cervical region: Secondary | ICD-10-CM | POA: Diagnosis not present

## 2020-12-20 DIAGNOSIS — M542 Cervicalgia: Secondary | ICD-10-CM | POA: Diagnosis not present

## 2020-12-20 DIAGNOSIS — M79602 Pain in left arm: Secondary | ICD-10-CM | POA: Diagnosis not present

## 2020-12-20 DIAGNOSIS — M25512 Pain in left shoulder: Secondary | ICD-10-CM | POA: Diagnosis not present

## 2020-12-21 DIAGNOSIS — M1990 Unspecified osteoarthritis, unspecified site: Secondary | ICD-10-CM | POA: Diagnosis not present

## 2020-12-21 DIAGNOSIS — M5134 Other intervertebral disc degeneration, thoracic region: Secondary | ICD-10-CM | POA: Diagnosis not present

## 2020-12-21 DIAGNOSIS — J449 Chronic obstructive pulmonary disease, unspecified: Secondary | ICD-10-CM | POA: Diagnosis not present

## 2020-12-21 DIAGNOSIS — I959 Hypotension, unspecified: Secondary | ICD-10-CM | POA: Diagnosis not present

## 2020-12-21 DIAGNOSIS — E785 Hyperlipidemia, unspecified: Secondary | ICD-10-CM | POA: Diagnosis not present

## 2020-12-21 DIAGNOSIS — I10 Essential (primary) hypertension: Secondary | ICD-10-CM | POA: Diagnosis not present

## 2020-12-21 DIAGNOSIS — R55 Syncope and collapse: Secondary | ICD-10-CM | POA: Diagnosis not present

## 2020-12-21 DIAGNOSIS — Z79891 Long term (current) use of opiate analgesic: Secondary | ICD-10-CM | POA: Diagnosis not present

## 2020-12-27 DIAGNOSIS — I10 Essential (primary) hypertension: Secondary | ICD-10-CM | POA: Diagnosis not present

## 2020-12-27 DIAGNOSIS — E785 Hyperlipidemia, unspecified: Secondary | ICD-10-CM | POA: Diagnosis not present

## 2020-12-27 DIAGNOSIS — R55 Syncope and collapse: Secondary | ICD-10-CM | POA: Diagnosis not present

## 2020-12-27 DIAGNOSIS — Z79891 Long term (current) use of opiate analgesic: Secondary | ICD-10-CM | POA: Diagnosis not present

## 2020-12-27 DIAGNOSIS — M1990 Unspecified osteoarthritis, unspecified site: Secondary | ICD-10-CM | POA: Diagnosis not present

## 2020-12-27 DIAGNOSIS — M5134 Other intervertebral disc degeneration, thoracic region: Secondary | ICD-10-CM | POA: Diagnosis not present

## 2020-12-27 DIAGNOSIS — I959 Hypotension, unspecified: Secondary | ICD-10-CM | POA: Diagnosis not present

## 2020-12-27 DIAGNOSIS — J449 Chronic obstructive pulmonary disease, unspecified: Secondary | ICD-10-CM | POA: Diagnosis not present

## 2020-12-29 DIAGNOSIS — M5134 Other intervertebral disc degeneration, thoracic region: Secondary | ICD-10-CM | POA: Diagnosis not present

## 2020-12-29 DIAGNOSIS — I959 Hypotension, unspecified: Secondary | ICD-10-CM | POA: Diagnosis not present

## 2020-12-29 DIAGNOSIS — M1990 Unspecified osteoarthritis, unspecified site: Secondary | ICD-10-CM | POA: Diagnosis not present

## 2020-12-29 DIAGNOSIS — Z79891 Long term (current) use of opiate analgesic: Secondary | ICD-10-CM | POA: Diagnosis not present

## 2020-12-29 DIAGNOSIS — E785 Hyperlipidemia, unspecified: Secondary | ICD-10-CM | POA: Diagnosis not present

## 2020-12-29 DIAGNOSIS — I10 Essential (primary) hypertension: Secondary | ICD-10-CM | POA: Diagnosis not present

## 2020-12-29 DIAGNOSIS — J449 Chronic obstructive pulmonary disease, unspecified: Secondary | ICD-10-CM | POA: Diagnosis not present

## 2020-12-29 DIAGNOSIS — R55 Syncope and collapse: Secondary | ICD-10-CM | POA: Diagnosis not present

## 2021-01-03 DIAGNOSIS — Z79891 Long term (current) use of opiate analgesic: Secondary | ICD-10-CM | POA: Diagnosis not present

## 2021-01-03 DIAGNOSIS — M1990 Unspecified osteoarthritis, unspecified site: Secondary | ICD-10-CM | POA: Diagnosis not present

## 2021-01-03 DIAGNOSIS — J449 Chronic obstructive pulmonary disease, unspecified: Secondary | ICD-10-CM | POA: Diagnosis not present

## 2021-01-03 DIAGNOSIS — E785 Hyperlipidemia, unspecified: Secondary | ICD-10-CM | POA: Diagnosis not present

## 2021-01-03 DIAGNOSIS — I959 Hypotension, unspecified: Secondary | ICD-10-CM | POA: Diagnosis not present

## 2021-01-03 DIAGNOSIS — M5134 Other intervertebral disc degeneration, thoracic region: Secondary | ICD-10-CM | POA: Diagnosis not present

## 2021-01-03 DIAGNOSIS — I10 Essential (primary) hypertension: Secondary | ICD-10-CM | POA: Diagnosis not present

## 2021-01-03 DIAGNOSIS — R55 Syncope and collapse: Secondary | ICD-10-CM | POA: Diagnosis not present

## 2021-01-11 DIAGNOSIS — M5134 Other intervertebral disc degeneration, thoracic region: Secondary | ICD-10-CM | POA: Diagnosis not present

## 2021-01-11 DIAGNOSIS — M1990 Unspecified osteoarthritis, unspecified site: Secondary | ICD-10-CM | POA: Diagnosis not present

## 2021-01-11 DIAGNOSIS — R55 Syncope and collapse: Secondary | ICD-10-CM | POA: Diagnosis not present

## 2021-01-11 DIAGNOSIS — Z79891 Long term (current) use of opiate analgesic: Secondary | ICD-10-CM | POA: Diagnosis not present

## 2021-01-11 DIAGNOSIS — J449 Chronic obstructive pulmonary disease, unspecified: Secondary | ICD-10-CM | POA: Diagnosis not present

## 2021-01-11 DIAGNOSIS — I959 Hypotension, unspecified: Secondary | ICD-10-CM | POA: Diagnosis not present

## 2021-01-11 DIAGNOSIS — I10 Essential (primary) hypertension: Secondary | ICD-10-CM | POA: Diagnosis not present

## 2021-01-11 DIAGNOSIS — E785 Hyperlipidemia, unspecified: Secondary | ICD-10-CM | POA: Diagnosis not present

## 2021-01-21 NOTE — Progress Notes (Signed)
Cardiology Office Note:    Date:  01/22/2021   ID:  [name] [name], DOB [birth year], MRN [id number]  PCP:  [name], MD  Cardiologist:  Sinclair Grooms, MD   Referring MD: [name], MD   Chief Complaint  Patient presents with   Coronary Artery Disease    History of Present Illness:    [name] [name] is a 80 y.o. male with a hx of CAD with CTO RCA, HTN, HLD, obesity, OSA, DM2, CKD, memory loss, and tobacco abuse.   Was admitted to the hospital in June 2022 with chest pain, shortness of breath, and near syncope.  No ischemic evaluation was performed.  He was found to be profoundly dehydrated.  He improved with rehydration.  No cardiac evaluation done at that time.  Angina he was having was felt to be secondary to chronic coronary disease.  Not currently using nitroglycerin for episodes of chest pain.  He is on a total of 60 mg of isosorbide mononitrate daily but is split into 30 mg twice daily.  He does have some orthostatic dizziness.  It is improved compared to prior.  Beta-blocker therapy was discontinued because of this complaint by his PCP.  Past Medical History:  Diagnosis Date   Adenomatous polyp 2007   Anginal pain ([place])    Chronic    Anxiety    Arthritis    "my body"   B12 deficiency    BPH (benign prostatic hyperplasia)    Chronic back pain    "all over"   Chronic low back pain    COPD (chronic obstructive pulmonary disease) (HCC)    Coronary artery disease 2009   DDD (degenerative disc disease), cervical    DDD (degenerative disc disease), lumbosacral    DDD (degenerative disc disease), thoracic    Depression    Gastroesophageal reflux    Gynecomastia 06/2009   History of kidney stones    Hyperglycemia    Hyperlipidemia LDL goal < 70    MI (myocardial infarction) ([place]) 08/1989; 1993   OSA on CPAP    "have mask; don't wear it" (02/02/2014) 09/25/15- now has a BiPAp- doesn'tt use it   Peptic ulcer disease 1959   Pre-diabetes    Right bundle branch  block    Tobacco abuse    Vertebral compression fracture ([place]) 1970    Past Surgical History:  Procedure Laterality Date   BOTOX INJECTION  08/08/2020   Procedure: BOTOX INJECTION;  Surgeon: Clarene Essex, MD;  Location: [place];  Service: Endoscopy;;   BYPASS GRAFT POPLITEAL TO POPLITEAL Left 06/2005   Dr Donnetta Hutching   BYPASS GRAFT POPLITEAL TO POPLITEAL Right 02/2001   Dr Deon Pilling   CARDIAC CATHETERIZATION  1991; 1993; 08/2012; 11/2013; 02/02/2014   I've had 6-7 total   CARDIAC CATHETERIZATION  02/02/2014   Procedure: LEFT HEART CATH AND CORONARY ANGIOGRAPHY;  Surgeon: Jettie Booze, MD;  mild disease in LAD/CFX, RCA w/ CTO, unsuccessfull PCI both w/ antegrade and retrograde approaches    CARPAL TUNNEL RELEASE Left 06/01/2019   Procedure: LEFT CARPAL TUNNEL RELEASE;  Surgeon: Daryll Brod, MD;  Location: [facility];  Service: Orthopedics;  Laterality: Left;  FOREARM BLOCK   CATARACT EXTRACTION W/ INTRAOCULAR LENS  IMPLANT, BILATERAL Bilateral ~ 2012   COLONOSCOPY W/ POLYPECTOMY     CORONARY ANGIOPLASTY  1990's   CYSTOSCOPY WITH STENT PLACEMENT Right 11/19/2019   Procedure: CYSTOSCOPY WITH RIGHT URETERAL STENT PLACEMENT;  Surgeon: Lucas Mallow, MD;  [facility];  Service: Urology;  Laterality: Right;   CYSTOSCOPY/URETEROSCOPY/HOLMIUM LASER/STENT PLACEMENT Right 12/22/2019   Procedure: CYSTOSCOPY RIGHT URETEROSCOPY/HOLMIUM LASER/STENT PLACEMENT;  Surgeon: Ardis Hughs, MD;  Location: [facility];  Service: Urology;  Laterality: Right;   ESOPHAGOGASTRODUODENOSCOPY (EGD) WITH ESOPHAGEAL DILATION  11/2007   ESOPHAGOGASTRODUODENOSCOPY (EGD) WITH PROPOFOL N/A 09/28/2015   Procedure: ESOPHAGOGASTRODUODENOSCOPY (EGD) WITH PROPOFOL;  Surgeon: Clarene Essex, MD;  Location: [facility] ENDOSCOPY;  Service: Endoscopy;  Laterality: N/A;   ESOPHAGOGASTRODUODENOSCOPY (EGD) WITH PROPOFOL N/A 09/09/2017   Procedure: ESOPHAGOGASTRODUODENOSCOPY (EGD) WITH PROPOFOL;  Surgeon: Clarene Essex, MD;   [facility];  Service: Endoscopy;  Laterality: N/A;   ESOPHAGOGASTRODUODENOSCOPY (EGD) WITH PROPOFOL N/A 03/09/2020   Procedure: ESOPHAGOGASTRODUODENOSCOPY (EGD) WITH PROPOFOL WITH DIL;  Surgeon: Clarene Essex, MD;  [facility];  Service: Endoscopy;  Laterality: N/A;   ESOPHAGOGASTRODUODENOSCOPY (EGD) WITH PROPOFOL N/A 08/08/2020   Procedure: ESOPHAGOGASTRODUODENOSCOPY (EGD) WITH PROPOFOL;  Surgeon: Clarene Essex, MD;  Location: [place];  Service: Endoscopy;  Laterality: N/A;  Probable Botox injection, possible dilatation   EYE SURGERY Bilateral    JOINT REPLACEMENT     LEFT HEART CATH AND CORONARY ANGIOGRAPHY N/A 08/10/2018   Procedure: LEFT HEART CATH AND CORONARY ANGIOGRAPHY;  Surgeon: Troy Sine, MD;  Location: [place] [facility];  Service: Cardiovascular;  Laterality: N/A;   LEFT HEART CATHETERIZATION WITH CORONARY ANGIOGRAM N/A 09/03/2012   Procedure: LEFT HEART CATHETERIZATION WITH CORONARY ANGIOGRAM;  Surgeon: Sinclair Grooms, MD;  Location: [facility] CATH LAB;  Service: Cardiovascular;  Laterality: N/A;   LEFT HEART CATHETERIZATION WITH CORONARY ANGIOGRAM N/A 12/01/2013   Procedure: LEFT HEART CATHETERIZATION WITH CORONARY ANGIOGRAM;  Surgeon: Sinclair Grooms, MD;  Location: [facility] CATH LAB;  Service: Cardiovascular;  Laterality: N/A;   LOWER EXTREMITY ANGIOGRAM Left 05/2005   L pop-pop BPG patent   NAILBED REPAIR Right 09/17/2016   Procedure: ABLATION NAILBED RIGHT INDEX;  Surgeon: Daryll Brod, MD;  Location: [place];  Service: Orthopedics;  Laterality: Right;   NASAL SEPTUM SURGERY  1977   PENILE PROSTHESIS IMPLANT  1990's   SAVORY DILATION N/A 09/28/2015   Procedure: SAVORY DILATION;  Surgeon: Clarene Essex, MD;  Location: [facility] ENDOSCOPY;  Service: Endoscopy;  Laterality: N/A;  have balloons available   SAVORY DILATION N/A 09/09/2017   Procedure: SAVORY DILATION;  Surgeon: Clarene Essex, MD;  [facility];  Service: Endoscopy;  Laterality: N/A;    SAVORY DILATION N/A 03/09/2020   Procedure: SAVORY DILATION;  Surgeon: Clarene Essex, MD;  Location[facility];  Service: Endoscopy;  Laterality: N/A;   SCROTAL SURGERY  08/1999   pump revision/notes 07/11/2010   SKIN FULL THICKNESS GRAFT Right 09/17/2016   Procedure: SKIN GRAFT FROM UPPER ARM;  Surgeon: Daryll Brod, MD;  Location: [place];  Service: Orthopedics;  Laterality: Right;   TOTAL KNEE ARTHROPLASTY Left 01/2009    Current Medications: Current Meds  Medication Sig   aspirin EC 81 MG tablet Take 1 tablet (81 mg total) by mouth daily. Resume after 4 days.   buPROPion (WELLBUTRIN) 100 MG tablet Take 100 mg by mouth at bedtime.   cefdinir (OMNICEF) 300 MG capsule Take 300 mg by mouth 2 (two) times daily.   Cholecalciferol (VITAMIN D3) 50 MCG (2000 UT) TABS Take 2,000 Units by mouth every morning.   donepezil (ARICEPT) 5 MG tablet Take 5 mg by mouth at bedtime.   famotidine (PEPCID) 10 MG tablet Take 10 mg by mouth 2 (two) times daily.  finasteride (PROSCAR) 5 MG tablet Take 5 mg by mouth at bedtime.   HYDROcodone-acetaminophen (NORCO/VICODIN) 5-325 MG tablet Take 1 tablet by mouth every 6 (six) hours as needed for severe pain.   nitroGLYCERIN (NITROSTAT) 0.4 MG SL tablet Place 0.4 mg under the tongue See admin instructions. Dissolve 0.4 mg sublingually 10 minutes before each meal for esophageal spasms and as needed for chest pain   ondansetron (ZOFRAN) 4 MG tablet Take 4 mg by mouth every 8 (eight) hours as needed for nausea or vomiting.   pantoprazole (PROTONIX) 40 MG tablet Take 40 mg by mouth 2 (two) times daily with a meal.   polyethylene glycol powder (GLYCOLAX/MIRALAX) 17 GM/SCOOP powder Take 17 g by mouth daily as needed for mild constipation (MIX AND DRINK).   ranolazine (RANEXA) 1000 MG SR tablet TAKE ONE TABLET BY MOUTH TWICE DAILY   rosuvastatin (CRESTOR) 10 MG tablet Take 10 mg by mouth every morning.   traMADol (ULTRAM) 50 MG tablet Take 1 tablet (50 mg  total) by mouth every 6 (six) hours as needed.   vitamin B-12 (CYANOCOBALAMIN) 1000 MCG tablet Take 1,000 mcg by mouth every morning.     Allergies:   Doxycycline, Flomax [tamsulosin], Lipitor [atorvastatin], Rhubarb, Sulfa antibiotics, Sulfacetamide sodium, Clindamycin/lincomycin, Other, and Vytorin [ezetimibe-simvastatin]   Social History   Socioeconomic History   Marital status: Widowed    Spouse name: Not on file   Number of children: Not on file   Years of education: Not on file   Highest education level: Not on file  Occupational History   Not on file  Tobacco Use   Smoking status: Former    Packs/day: 0.25    Years: 59.00    Pack years: 14.75    Types: Cigarettes   Smokeless tobacco: Former    Types: Chew   Tobacco comments:    patient states quit around month ago(december 2021)  Vaping Use   Vaping Use: Never used  Substance and Sexual Activity   Alcohol use: No    Alcohol/week: 0.0 standard drinks   Drug use: No   Sexual activity: Not on file  Other Topics Concern   Not on file  Social History Narrative   Tobacco use cigarettes: former smoker, quit in year 2014. Pack-year Hx: 70, tobacco history last updated 05/12/2013, additional findings: tobacco user moderate cigarette smoker (10-19 cigs/day), additional findings: tobacco Non-User current non-smoker, no smoking. No alcohol, stopped 1981. 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 Marital status: married. 4 children.   Social Determinants of Health   Financial Resource Strain: Not on file  Food Insecurity: Not on file  Transportation Needs: Not on file  Physical Activity: Not on file  Stress: Not on file  Social Connections: Not on file     Family History: The patient's family history includes Alzheimer's disease (age of onset: 27) in his mother; Diabetes in his daughter; Diabetes (age of onset: 17) in his father; Fibromyalgia (age of onset: 30) in his  sister; Hypertension in his son; Obesity in his daughter; Peripheral vascular disease in his father; Pneumonia in his father.  ROS:   Please see the history of present illness.    He has stopped smoking.  Orthostasis.  All other systems reviewed and are negative.  EKGs/Labs/Other Studies Reviewed:    The following studies were reviewed today:  2D Doppler echocardiogram June 2022: IMPRESSIONS     1. Left ventricular ejection fraction, by estimation, is 55 to 60%.  The  left ventricle has normal function. The left ventricle has no regional  wall motion abnormalities. Left ventricular diastolic parameters are  consistent with Grade I diastolic  dysfunction (impaired relaxation).   2. Right ventricular systolic function is normal. The right ventricular  size is normal. There is normal pulmonary artery systolic pressure. The  estimated right ventricular systolic pressure is 63.8 mmHg.   3. The mitral valve is normal in structure. No evidence of mitral valve  regurgitation. No evidence of mitral stenosis.   4. The aortic valve has an indeterminant number of cusps. Aortic valve  regurgitation is not visualized. Mild aortic valve sclerosis is present,  with no evidence of aortic valve stenosis.   5. The inferior vena cava is dilated in size with <50% respiratory  variability, suggesting right atrial pressure of 15 mmHg.   EKG:  EKG performed August 05, 2020 demonstrates sinus rhythm with right bundle branch block.  No changes noted when compared to the prior done 5 days earlier.  Recent Labs: 08/05/2020: ALT 17; B Natriuretic Peptide 382.2; Magnesium 2.1 08/09/2020: BUN 20; Creatinine, Ser 1.46; Hemoglobin 11.6; Platelets 111; Potassium 3.4; Sodium 135  Recent Lipid Panel    Component Value Date/Time   CHOL 125 07/30/2020 2039   TRIG 109 07/30/2020 2039   HDL 44 07/30/2020 2039   CHOLHDL 2.8 07/30/2020 2039   VLDL 22 07/30/2020 2039   LDLCALC 59 07/30/2020 2039    Physical Exam:     VS:  BP (!) 118/54   Pulse 70   Ht 5\' 9"  (1.753 m)   Wt 195 lb 6.4 oz (88.6 kg)   SpO2 96%   BMI 28.86 kg/m     Wt Readings from Last 3 Encounters:  01/22/21 195 lb 6.4 oz (88.6 kg)  10/18/20 183 lb 12.8 oz (83.4 kg)  08/16/20 176 lb 6.4 oz (80 kg)     GEN: Elderly and overweight. No acute distress HEENT: Normal NECK: No JVD. LYMPHATICS: No lymphadenopathy CARDIAC: 1-2 over 6 French right upper sternal systolic murmur. RRR no gallop, or edema. VASCULAR:  Normal Pulses. No bruits. RESPIRATORY:  Clear to auscultation without rales, wheezing or rhonchi  ABDOMEN: Soft, non-tender, non-distended, No pulsatile mass, MUSCULOSKELETAL: No deformity  SKIN: Warm and dry NEUROLOGIC:  Alert and oriented x 3 PSYCHIATRIC:  Normal affect   ASSESSMENT:    1. Coronary artery disease involving native coronary artery of native heart, unspecified whether angina present   2. Essential hypertension   3. Hyperlipidemia, unspecified hyperlipidemia type   4. Stage 3 chronic kidney disease, unspecified whether stage 3a or 3b CKD ([place])   5. Tobacco abuse   6. Right bundle branch block    PLAN:    In order of problems listed above:  Encouraged secondary prevention.  Unable to exercise very much due to severe COPD. Low normal blood pressure.  For resolution would be to decrease intensity of long-acting nitrates.  We chose against this given the absence of angina.  He is not on any other therapy that would lower his blood pressure. Continue Crestor 10 mg/day.  Most recent LDL was 59 in June. Most recent creatinine was 1.46 in June 2022. He has discontinued smoking Unchanged   Medication Adjustments/Labs and Tests Ordered: Current medicines are reviewed at length with the patient today.  Concerns regarding medicines are outlined above.  No orders of the defined types were placed in this encounter.  No orders of the defined types were placed in this encounter.  Patient Instructions   Medication Instructions:  Your physician recommends that you continue on your current medications as directed. Please refer to the Current Medication list given to you today.   *If you need a refill on your cardiac medications before your next appointment, please call your pharmacy*   Lab Work: None If you have labs (blood work) drawn today and your tests are completely normal, you will receive your results only by: [place] (if you have MyChart) OR A paper copy in the mail If you have any lab test that is abnormal or we need to change your treatment, we will call you to review the results.   Testing/Procedures: None   Follow-Up: At [facility], you and your health needs are our priority.  As part of our continuing mission to provide you with exceptional heart care, we have created designated Provider Care Teams.  These Care Teams include your primary Cardiologist (physician) and Advanced Practice Providers (APPs -  Physician Assistants and Nurse Practitioners) who all work together to provide you with the care you need, when you need it.  We recommend signing up for the patient portal called "MyChart".  Sign up information is provided on this After Visit Summary.  MyChart is used to connect with patients for Virtual Visits (Telemedicine).  Patients are able to view lab/test results, encounter notes, upcoming appointments, etc.  Non-urgent messages can be sent to your provider as well.   To learn more about what you can do with MyChart, go to [email or web address].    Your next appointment:   9-12 month(s)  The format for your next appointment:   In Person  Provider:   Sinclair Grooms, MD     Other Instructions     Signed, Sinclair Grooms, MD  01/22/2021 11:25 AM    [place]

## 2021-01-22 ENCOUNTER — Ambulatory Visit: Payer: Medicare Other | Admitting: Interventional Cardiology

## 2021-01-22 ENCOUNTER — Other Ambulatory Visit: Payer: Self-pay

## 2021-01-22 ENCOUNTER — Encounter: Payer: Self-pay | Admitting: Interventional Cardiology

## 2021-01-22 VITALS — BP 118/54 | HR 70 | Ht 69.0 in | Wt 195.4 lb

## 2021-01-22 DIAGNOSIS — I2510 Atherosclerotic heart disease of native coronary artery without angina pectoris: Secondary | ICD-10-CM

## 2021-01-22 DIAGNOSIS — I10 Essential (primary) hypertension: Secondary | ICD-10-CM

## 2021-01-22 DIAGNOSIS — I4510 Unspecified right bundle-branch block: Secondary | ICD-10-CM

## 2021-01-22 DIAGNOSIS — Z720 Tobacco use: Secondary | ICD-10-CM

## 2021-01-22 DIAGNOSIS — E785 Hyperlipidemia, unspecified: Secondary | ICD-10-CM

## 2021-01-22 DIAGNOSIS — N183 Chronic kidney disease, stage 3 unspecified: Secondary | ICD-10-CM

## 2021-01-22 NOTE — Patient Instructions (Signed)
Medication Instructions:  ?Your physician recommends that you continue on your current medications as directed. Please refer to the Current Medication list given to you today. ? ?*If you need a refill on your cardiac medications before your next appointment, please call your pharmacy* ? ? ?Lab Work: ?None ?If you have labs (blood work) drawn today and your tests are completely normal, you will receive your results only by: ?MyChart Message (if you have MyChart) OR ?A paper copy in the mail ?If you have any lab test that is abnormal or we need to change your treatment, we will call you to review the results. ? ? ?Testing/Procedures: ?None ? ? ?Follow-Up: ?At [facility], you and your health needs are our priority.  As part of our continuing mission to provide you with exceptional heart care, we have created designated Provider Care Teams.  These Care Teams include your primary Cardiologist (physician) and Advanced Practice Providers (APPs -  Physician Assistants and Nurse Practitioners) who all work together to provide you with the care you need, when you need it. ? ?We recommend signing up for the patient portal called "MyChart".  Sign up information is provided on this After Visit Summary.  MyChart is used to connect with patients for Virtual Visits (Telemedicine).  Patients are able to view lab/test results, encounter notes, upcoming appointments, etc.  Non-urgent messages can be sent to your provider as well.   ?To learn more about what you can do with MyChart, go to [email or web address].   ? ?Your next appointment:   ?9-12 month(s) ? ?The format for your next appointment:   ?In Person ? ?Provider:   ?Henry W Smith III, MD  ? ? ?Other Instructions ?  ?

## 2021-03-19 DIAGNOSIS — Z79899 Other long term (current) drug therapy: Secondary | ICD-10-CM | POA: Diagnosis not present

## 2021-03-19 DIAGNOSIS — E7800 Pure hypercholesterolemia, unspecified: Secondary | ICD-10-CM | POA: Diagnosis not present

## 2021-03-19 DIAGNOSIS — K219 Gastro-esophageal reflux disease without esophagitis: Secondary | ICD-10-CM | POA: Diagnosis not present

## 2021-03-19 DIAGNOSIS — D649 Anemia, unspecified: Secondary | ICD-10-CM | POA: Diagnosis not present

## 2021-03-19 DIAGNOSIS — I209 Angina pectoris, unspecified: Secondary | ICD-10-CM | POA: Diagnosis not present

## 2021-03-19 DIAGNOSIS — E1140 Type 2 diabetes mellitus with diabetic neuropathy, unspecified: Secondary | ICD-10-CM | POA: Diagnosis not present

## 2021-03-19 DIAGNOSIS — N62 Hypertrophy of breast: Secondary | ICD-10-CM | POA: Diagnosis not present

## 2021-03-19 DIAGNOSIS — E119 Type 2 diabetes mellitus without complications: Secondary | ICD-10-CM | POA: Diagnosis not present

## 2021-03-19 DIAGNOSIS — I10 Essential (primary) hypertension: Secondary | ICD-10-CM | POA: Diagnosis not present

## 2021-03-19 DIAGNOSIS — K9089 Other intestinal malabsorption: Secondary | ICD-10-CM | POA: Diagnosis not present

## 2021-03-19 DIAGNOSIS — J411 Mucopurulent chronic bronchitis: Secondary | ICD-10-CM | POA: Diagnosis not present

## 2021-03-19 DIAGNOSIS — Z0000 Encounter for general adult medical examination without abnormal findings: Secondary | ICD-10-CM | POA: Diagnosis not present

## 2021-03-19 DIAGNOSIS — I700 Atherosclerosis of aorta: Secondary | ICD-10-CM | POA: Diagnosis not present

## 2021-05-25 ENCOUNTER — Other Ambulatory Visit: Payer: Self-pay | Admitting: Gastroenterology

## 2021-06-06 ENCOUNTER — Encounter (HOSPITAL_COMMUNITY): Payer: Self-pay | Admitting: Gastroenterology

## 2021-06-06 NOTE — Progress Notes (Signed)
Attempted to obtain medical history via telephone, unable to reach at this time. I left a voicemail to return pre surgical testing department's phone call.  

## 2021-06-10 ENCOUNTER — Encounter (HOSPITAL_COMMUNITY): Payer: Self-pay | Admitting: Gastroenterology

## 2021-06-11 ENCOUNTER — Ambulatory Visit (HOSPITAL_COMMUNITY): Payer: Medicare Other | Admitting: Anesthesiology

## 2021-06-11 ENCOUNTER — Ambulatory Visit (HOSPITAL_COMMUNITY)
Admission: RE | Admit: 2021-06-11 | Discharge: 2021-06-11 | Disposition: A | Discharge status: Home / Self Care | Payer: Medicare Other | Attending: Gastroenterology | Admitting: Gastroenterology

## 2021-06-11 ENCOUNTER — Other Ambulatory Visit: Payer: Self-pay

## 2021-06-11 ENCOUNTER — Encounter (HOSPITAL_COMMUNITY): Payer: Self-pay | Admitting: Gastroenterology

## 2021-06-11 ENCOUNTER — Ambulatory Visit (HOSPITAL_BASED_OUTPATIENT_CLINIC_OR_DEPARTMENT_OTHER): Payer: Medicare Other | Admitting: Anesthesiology

## 2021-06-11 ENCOUNTER — Encounter (HOSPITAL_COMMUNITY)
Admission: RE | Disposition: A | Discharge status: Home / Self Care | Payer: Self-pay | Source: Home / Self Care | Attending: Gastroenterology

## 2021-06-11 DIAGNOSIS — Z87891 Personal history of nicotine dependence: Secondary | ICD-10-CM

## 2021-06-11 DIAGNOSIS — Z8711 Personal history of peptic ulcer disease: Secondary | ICD-10-CM | POA: Diagnosis not present

## 2021-06-11 DIAGNOSIS — I509 Heart failure, unspecified: Secondary | ICD-10-CM | POA: Diagnosis not present

## 2021-06-11 DIAGNOSIS — I25119 Atherosclerotic heart disease of native coronary artery with unspecified angina pectoris: Secondary | ICD-10-CM

## 2021-06-11 DIAGNOSIS — K2940 Chronic atrophic gastritis without bleeding: Secondary | ICD-10-CM | POA: Insufficient documentation

## 2021-06-11 DIAGNOSIS — I252 Old myocardial infarction: Secondary | ICD-10-CM | POA: Diagnosis not present

## 2021-06-11 DIAGNOSIS — D759 Disease of blood and blood-forming organs, unspecified: Secondary | ICD-10-CM | POA: Diagnosis not present

## 2021-06-11 DIAGNOSIS — D649 Anemia, unspecified: Secondary | ICD-10-CM | POA: Diagnosis not present

## 2021-06-11 DIAGNOSIS — R1310 Dysphagia, unspecified: Secondary | ICD-10-CM | POA: Diagnosis not present

## 2021-06-11 DIAGNOSIS — M1990 Unspecified osteoarthritis, unspecified site: Secondary | ICD-10-CM | POA: Diagnosis not present

## 2021-06-11 DIAGNOSIS — G4730 Sleep apnea, unspecified: Secondary | ICD-10-CM | POA: Diagnosis not present

## 2021-06-11 DIAGNOSIS — I5030 Unspecified diastolic (congestive) heart failure: Secondary | ICD-10-CM | POA: Insufficient documentation

## 2021-06-11 DIAGNOSIS — I110 Hypertensive heart disease with heart failure: Secondary | ICD-10-CM | POA: Insufficient documentation

## 2021-06-11 DIAGNOSIS — K219 Gastro-esophageal reflux disease without esophagitis: Secondary | ICD-10-CM | POA: Insufficient documentation

## 2021-06-11 HISTORY — PX: BOTOX INJECTION: SHX5754

## 2021-06-11 HISTORY — PX: ESOPHAGOGASTRODUODENOSCOPY (EGD) WITH PROPOFOL: SHX5813

## 2021-06-11 SURGERY — ESOPHAGOGASTRODUODENOSCOPY (EGD) WITH PROPOFOL
Anesthesia: Monitor Anesthesia Care

## 2021-06-11 MED ORDER — PROPOFOL 500 MG/50ML IV EMUL
INTRAVENOUS | Status: DC | PRN
  Administered 2021-06-11: 30 mg via INTRAVENOUS
  Administered 2021-06-11: 20 mg via INTRAVENOUS

## 2021-06-11 MED ORDER — SODIUM CHLORIDE (PF) 0.9 % IJ SOLN
INTRAMUSCULAR | Status: DC | PRN
  Administered 2021-06-11: 5 mL via SUBMUCOSAL

## 2021-06-11 MED ORDER — GLYCOPYRROLATE 0.2 MG/ML IJ SOLN
INTRAMUSCULAR | Status: DC | PRN
  Administered 2021-06-11: .2 mg via INTRAVENOUS

## 2021-06-11 MED ORDER — LACTATED RINGERS IV SOLN
INTRAVENOUS | Status: DC
  Administered 2021-06-11: 1000 mL via INTRAVENOUS

## 2021-06-11 MED ORDER — PROPOFOL 500 MG/50ML IV EMUL
INTRAVENOUS | Status: AC
  Filled 2021-06-11: qty 50

## 2021-06-11 MED ORDER — PROPOFOL 500 MG/50ML IV EMUL
INTRAVENOUS | Status: DC | PRN
  Administered 2021-06-11: 125 ug/kg/min via INTRAVENOUS

## 2021-06-11 MED ORDER — ONABOTULINUMTOXINA 100 UNITS IJ SOLR
INTRAMUSCULAR | Status: AC
  Filled 2021-06-11: qty 100

## 2021-06-11 MED ORDER — SODIUM CHLORIDE (PF) 0.9 % IJ SOLN
INTRAMUSCULAR | Status: AC
  Filled 2021-06-11: qty 10

## 2021-06-11 SURGICAL SUPPLY — 15 items

## 2021-06-11 NOTE — Discharge Instructions (Addendum)
Begin with liquids and if doing well may slowly advance diet May resume aspirin on Wednesday and call sooner if question or problem otherwise follow-up in a few months ? ?YOU HAD AN ENDOSCOPIC PROCEDURE TODAY: Refer to the procedure report and other information in the discharge instructions given to you for any specific questions about what was found during the examination. If this information does not answer your questions, please call [facility] office at [phone] to clarify.  ? ?YOU SHOULD EXPECT: Some feelings of bloating in the abdomen. Passage of more gas than usual. Walking can help get rid of the air that was put into your GI tract during the procedure and reduce the bloating. If you had a lower endoscopy (such as a colonoscopy or flexible sigmoidoscopy) you may notice spotting of blood in your stool or on the toilet paper. Some abdominal soreness may be present for a day or two, also. ? ?DIET: Your first meal following the procedure should be a light meal and then it is ok to progress to your normal diet. A half-sandwich or bowl of soup is an example of a good first meal. Heavy or fried foods are harder to digest and may make you feel nauseous or bloated. Drink plenty of fluids but you should avoid alcoholic beverages for 24 hours. If you had a esophageal dilation, please see attached instructions for diet.   ? ?ACTIVITY: Your care partner should take you home directly after the procedure. You should plan to take it easy, moving slowly for the rest of the day. You can resume normal activity the day after the procedure however YOU SHOULD NOT DRIVE, use power tools, machinery or perform tasks that involve climbing or major physical exertion for 24 hours (because of the sedation medicines used during the test).  ? ?SYMPTOMS TO REPORT IMMEDIATELY: ?A gastroenterologist can be reached at any hour. Please call [phone]  for any of the following symptoms:  ?Following upper endoscopy (EGD, EUS, ERCP,  esophageal dilation) ?Vomiting of blood or coffee ground material  ?New, significant abdominal pain  ?New, significant chest pain or pain under the shoulder blades  ?Painful or persistently difficult swallowing  ?New shortness of breath  ?Black, tarry-looking or red, bloody stools ? ?FOLLOW UP:  ?If any biopsies were taken you will be contacted by phone or by letter within the next 1-3 weeks. Call [phone]  if you have not heard about the biopsies in 3 weeks.  ?Please also call with any specific questions about appointments or follow up tests. YOU HAD AN ENDOSCOPIC PROCEDURE TODAY: Refer to the procedure report and other information in the discharge instructions given to you for any specific questions about what was found during the examination. If this information does not answer your questions, please call [facility] office at [phone] to clarify.   ?

## 2021-06-11 NOTE — Anesthesia Preprocedure Evaluation (Addendum)
Anesthesia Evaluation  ?Patient identified by MRN, date of birth, ID band ?Patient awake ? ? ? ?Reviewed: ?Allergy & Precautions, NPO status , Patient's Chart, lab work & pertinent test results ? ?Airway ?Mallampati: III ? ?TM Distance: >3 FB ?Neck ROM: Full ? ? ? Dental ? ?(+) Edentulous Upper, Edentulous Lower ?  ?Pulmonary ?sleep apnea (does not use his BiPAP) , COPD, Current Smoker and Patient abstained from smoking., former smoker,  ?  ?Pulmonary exam normal ?breath sounds clear to auscultation ? ? ? ? ? ? Cardiovascular ?hypertension, Pt. on medications ?+ angina + CAD, + Past MI and +CHF  ?Normal cardiovascular exam ?Rhythm:Regular Rate:Normal ? ?Echo 07/2020 ??1. Left ventricular ejection fraction, by estimation, is 55 to 60%. The left ventricle has normal function. The left ventricle has no regional wall motion abnormalities. Left ventricular diastolic parameters are consistent with Grade I diastolic dysfunction (impaired relaxation).  ??2. Right ventricular systolic function is normal. The right ventricular size is normal. There is normal pulmonary artery systolic pressure. The estimated right ventricular systolic pressure is 87.5 mmHg.  ??3. The mitral valve is normal in structure. No evidence of mitral valve regurgitation. No evidence of mitral stenosis.  ??4. The aortic valve has an indeterminant number of cusps. Aortic valve regurgitation is not visualized. Mild aortic valve sclerosis is present, with no evidence of aortic valve stenosis.  ??5. The inferior vena cava is dilated in size with <50% respiratory variability, suggesting right atrial pressure of 15 mmHg.  ? ? ?RBBB ? ?TTE 2021 ?EF 50-55%, valves ok ? ?[place] 2021 ?Known chronic total occlusion of the proximal RCA with extensive collateralization to a large distal RCA with the left circumflex coronary artery supplying the posterior lateral vessels and LAD/septal perforating arteries supplying the PDA.  There is an  80% stenosis in the continuation branch between the PDA and PLA takeoff. ?? ?Mild nonobstructive disease involving the LAD with 30% stenosis after the second diagonal vessel and normal left circumflex coronary artery. ?? ?Low normal LV function with EF estimated approximately 50%.  There is a focal area of inferobasilar akinesis.  The EDP is 18 mm Hg ? ?  ?Neuro/Psych ?PSYCHIATRIC DISORDERS Anxiety Depression negative neurological ROS ?   ? GI/Hepatic ?Neg liver ROS, PUD, GERD  Medicated and Controlled,  ?Endo/Other  ?negative endocrine ROS ? Renal/GU ?Renal disease  ?negative genitourinary ?  ?Musculoskeletal ? ?(+) Arthritis ,  ? Abdominal ?  ?Peds ? Hematology ? ?(+) Blood dyscrasia, anemia ,   ?Anesthesia Other Findings ? ? Reproductive/Obstetrics ? ?  ? ? ? ? ? ? ? ? ? ? ? ? ? ?  ?  ? ? ? ? ? ? ? ?Anesthesia Physical ? ?Anesthesia Plan ? ?ASA: 3 ? ?Anesthesia Plan: MAC  ? ?Post-op Pain Management: Minimal or no pain anticipated  ? ?Induction:  ? ?PONV Risk Score and Plan: 1 and Propofol infusion, Treatment may vary due to age or medical condition and TIVA ? ?Airway Management Planned: Natural Airway ? ?Additional Equipment: None ? ?Intra-op Plan:  ? ?Post-operative Plan:  ? ?Informed Consent: I have reviewed the patients History and Physical, chart, labs and discussed the procedure including the risks, benefits and alternatives for the proposed anesthesia with the patient or authorized representative who has indicated his/her understanding and acceptance.  ? ? ? ?Dental advisory given ? ?Plan Discussed with: CRNA ? ?Anesthesia Plan Comments:   ? ? ? ? ? ?Anesthesia Quick Evaluation ? ?

## 2021-06-11 NOTE — Transfer of Care (Signed)
Immediate Anesthesia Transfer of Care Note ? ?Patient: [name] [name] ? ?Procedure(s) Performed: Procedure(s): ?ESOPHAGOGASTRODUODENOSCOPY (EGD) WITH PROPOFOL (N/A) ?BOTOX INJECTION ? ?Patient Location: PACU ? ?Anesthesia Type:MAC ? ?Level of Consciousness:  sedated, patient cooperative and responds to stimulation ? ?Airway & Oxygen Therapy:Patient Spontanous Breathing and Patient connected to face mask oxgen ? ?Post-op Assessment:  Report given to PACU RN and Post -op Vital signs reviewed and stable ? ?Post vital signs:  Reviewed and stable ? ?Last Vitals:  ?Vitals:  ? 06/11/21 1039  ?BP: (!) 152/72  ?Pulse: (!) 59  ?Resp: 18  ?Temp: (!) 36.4 ?C  ?SpO2: 100%  ? ? ?Complications: No apparent anesthesia complications ? ?

## 2021-06-11 NOTE — Anesthesia Postprocedure Evaluation (Signed)
Anesthesia Post Note ? ?Patient: [name] [name] ? ?Procedure(s) Performed: ESOPHAGOGASTRODUODENOSCOPY (EGD) WITH PROPOFOL ?BOTOX INJECTION ? ?  ? ?Patient location during evaluation: PACU ?Anesthesia Type: MAC ?Level of consciousness: awake and alert ?Pain management: pain level controlled ?Vital Signs Assessment: post-procedure vital signs reviewed and stable ?Respiratory status: spontaneous breathing ?Cardiovascular status: stable ?Anesthetic complications: no ? ? ?No notable events documented. ? ?Last Vitals:  ?Vitals:  ? 06/11/21 1320 06/11/21 1329  ?BP: 114/60 (!) 147/70  ?Pulse: 61 65  ?Resp: 13 15  ?Temp:    ?SpO2: 94% 95%  ?  ?Last Pain:  ?Vitals:  ? 06/11/21 1329  ?TempSrc:   ?PainSc: 0-No pain  ? ? ?  ?  ?  ?  ?  ?  ? ?Nolon Nations ? ? ? ? ?

## 2021-06-11 NOTE — Progress Notes (Signed)
[name] [name] ?12:17 PM ? ?Subjective: ?Patient without any new complaints since we have seen him in the past and says his Botox helped for a good while swallowing problem has come back in the last month or 2 he has no other complaints ? ?Objective: ?Vital signs stable afebrile no acute distress exam please see preassessment evaluation ? ?Assessment: ?Achalasia versus esophageal dysmotility helped by Botox in the past ? ?Plan: ?Okay to proceed with repeat endoscopy and probable Botox with anesthesia assistance ? ?[facility] ? ?office [phone] ?After 5PM or if no answer call [phone]  ?

## 2021-06-11 NOTE — Op Note (Signed)
[facility] ?Patient Name: [name] [name] ?Procedure Date: 06/11/2021 ?MRN: [id number] ?Attending MD: Clarene Essex , MD ?Date of Birth: Feb 17, 1941 ?CSN: [id number] ?Age: 81 ?Admit Type: Outpatient ?Procedure:                Upper GI endoscopy ?Indications:              Dysphagia, Suspected achalasia ?Providers:                Clarene Essex, MD, Jaci Carrel, RN, Janie Billups,  ?                          Technician, [employer], CRNA ?Referring MD:              ?Medicines:                Propofol total dose 220 mg IV, Robinul 0.2 mg ?Complications:            No immediate complications. ?Estimated Blood Loss:     Estimated blood loss: none. Estimated blood loss:  ?                          none. ?Procedure:                Pre-Anesthesia Assessment: ?                          - Prior to the procedure, a History and Physical  ?                          was performed, and patient medications and  ?                          allergies were reviewed. The patient's tolerance of  ?                          previous anesthesia was also reviewed. The risks  ?                          and benefits of the procedure and the sedation  ?                          options and risks were discussed with the patient.  ?                          All questions were answered, and informed consent  ?                          was obtained. Prior Anticoagulants: The patient has  ?                          taken no previous anticoagulant or antiplatelet  ?                          agents except for aspirin. ASA Grade Assessment:  ?  III - A patient with severe systemic disease. After  ?                          reviewing the risks and benefits, the patient was  ?                          deemed in satisfactory condition to undergo the  ?                          procedure. ?                          After obtaining informed consent, the endoscope was  ?                          passed under direct vision.  Throughout the  ?                          procedure, the patient's blood pressure, pulse, and  ?                          oxygen saturations were monitored continuously. The  ?                          GIF-H190 ([id number]) Olympus endoscope was introduced  ?                          through the mouth, and advanced to the third part  ?                          of duodenum. The upper GI endoscopy was  ?                          accomplished without difficulty. The patient  ?                          tolerated the procedure well. ?Scope In: ?Scope Out: ?Findings: ?     The larynx was normal. ?     A hypertonic lower esophageal sphincter was found. There was mild  ?     resistance to endoscope advancement into the stomach. The Z-line was  ?     regular. The gastroesophageal junction and cardia were normal on  ?     retroflexed view. Area was successfully injected with 100 units  ?     botulinum toxin. ?     Diffuse mild inflammation characterized by congestion (edema) was found  ?     in the cardia, in the gastric fundus and in the gastric body. ?     The duodenal bulb, first portion of the duodenum, second portion of the  ?     duodenum and third portion of the duodenum were normal. ?     The exam was otherwise without abnormality. ?     A small amount of food (residue) was found in the gastric antrum. ?Impression:               - Normal larynx. ?                          -  The examination was suspicious for achalasia.  ?                          Injected with botulinum toxin. ?                          - Atrophic gastritis. ?                          - Normal duodenal bulb, first portion of the  ?                          duodenum, second portion of the duodenum and third  ?                          portion of the duodenum. ?                          - The examination was otherwise normal. ?                          - No specimens collected. ?Moderate Sedation: ?     Not Applicable - Patient had care per  Anesthesia. ?Recommendation:           - Clear liquid diet for 1 hour. ?                          - Continue present medications. ?                          - Telephone GI office to schedule appointment in 3  ?                          months. ?                          - Telephone GI clinic if symptomatic PRN. ?                          - No aspirin, ibuprofen, naproxen, or other  ?                          non-steroidal anti-inflammatory drugs for 2 days. ?Procedure Code(s):        --- Professional --- ?                          [phone], Esophagogastroduodenoscopy, flexible,  ?                          transoral; with directed submucosal injection(s),  ?                          any substance ?Diagnosis Code(s):        --- Professional --- ?                          K29.40, Chronic atrophic gastritis without bleeding ?  R13.10, Dysphagia, unspecified ?CPT copyright 2019 American Medical Association. All rights reserved. ?The codes documented in this report are preliminary and upon coder review may  ?be revised to meet current compliance requirements. ?Clarene Essex, MD ?06/11/2021 1:13:40 PM ?This report has been signed electronically. ?Number of Addenda: 0 ?

## 2021-06-12 ENCOUNTER — Encounter (HOSPITAL_COMMUNITY): Payer: Self-pay | Admitting: Gastroenterology

## 2021-07-26 ENCOUNTER — Other Ambulatory Visit: Payer: Self-pay | Admitting: Physician Assistant

## 2021-07-27 ENCOUNTER — Other Ambulatory Visit: Payer: Self-pay | Admitting: Physician Assistant

## 2021-07-27 MED ORDER — ISOSORBIDE DINITRATE 30 MG PO TABS: 30.0000 mg | ORAL_TABLET | Freq: Two times a day (BID) | ORAL | 1 refills | Status: DC

## 2021-07-30 ENCOUNTER — Telehealth: Payer: Self-pay

## 2021-07-30 MED ORDER — ISOSORBIDE MONONITRATE ER 30 MG PO TB24: 30.0000 mg | ORAL_TABLET | Freq: Two times a day (BID) | ORAL | 1 refills | Status: DC

## 2021-07-30 NOTE — Telephone Encounter (Signed)
Pt's medication was resent to pt's pharmacy. Confirmation received.

## 2021-07-30 NOTE — Telephone Encounter (Signed)
Received incoming call from patient's daugther Ruta Hinds.  [name] states she was returning a call to [facility] to confirm that patient is taking Isosorbide mononitrate ER '30mg'$  twice daily. She states this is what patient has received from [facility] for the last 3 months.  Will forward to [facility] for refill to [facility].

## 2021-07-30 NOTE — Addendum Note (Signed)
Addended by: Carter Kitten D on: 07/30/2021 10:30 AM   Modules accepted: Orders

## 2021-09-05 ENCOUNTER — Other Ambulatory Visit: Payer: Self-pay | Admitting: Physical Medicine and Rehabilitation

## 2021-09-05 ENCOUNTER — Ambulatory Visit
Admission: RE | Admit: 2021-09-05 | Discharge: 2021-09-05 | Disposition: A | Discharge status: Home / Self Care | Payer: Medicare Other | Source: Ambulatory Visit | Attending: Physical Medicine and Rehabilitation | Admitting: Physical Medicine and Rehabilitation

## 2021-09-05 DIAGNOSIS — R200 Anesthesia of skin: Secondary | ICD-10-CM | POA: Diagnosis not present

## 2021-09-05 DIAGNOSIS — M4186 Other forms of scoliosis, lumbar region: Secondary | ICD-10-CM | POA: Diagnosis not present

## 2021-09-05 DIAGNOSIS — M545 Low back pain, unspecified: Secondary | ICD-10-CM

## 2021-09-05 DIAGNOSIS — G8929 Other chronic pain: Secondary | ICD-10-CM

## 2021-09-05 DIAGNOSIS — M47816 Spondylosis without myelopathy or radiculopathy, lumbar region: Secondary | ICD-10-CM | POA: Diagnosis not present

## 2021-09-05 DIAGNOSIS — M546 Pain in thoracic spine: Secondary | ICD-10-CM | POA: Diagnosis not present

## 2021-09-05 DIAGNOSIS — I700 Atherosclerosis of aorta: Secondary | ICD-10-CM | POA: Diagnosis not present

## 2021-09-05 DIAGNOSIS — M542 Cervicalgia: Secondary | ICD-10-CM | POA: Diagnosis not present

## 2021-09-17 DIAGNOSIS — I209 Angina pectoris, unspecified: Secondary | ICD-10-CM | POA: Diagnosis not present

## 2021-09-17 DIAGNOSIS — G5603 Carpal tunnel syndrome, bilateral upper limbs: Secondary | ICD-10-CM | POA: Diagnosis not present

## 2021-09-17 DIAGNOSIS — I10 Essential (primary) hypertension: Secondary | ICD-10-CM | POA: Diagnosis not present

## 2021-09-17 DIAGNOSIS — M47816 Spondylosis without myelopathy or radiculopathy, lumbar region: Secondary | ICD-10-CM | POA: Diagnosis not present

## 2021-09-17 DIAGNOSIS — J449 Chronic obstructive pulmonary disease, unspecified: Secondary | ICD-10-CM | POA: Diagnosis not present

## 2021-10-02 DIAGNOSIS — M47816 Spondylosis without myelopathy or radiculopathy, lumbar region: Secondary | ICD-10-CM | POA: Diagnosis not present

## 2021-11-14 ENCOUNTER — Other Ambulatory Visit: Payer: Self-pay | Admitting: Interventional Cardiology

## 2021-11-23 ENCOUNTER — Ambulatory Visit: Payer: Medicare Other | Admitting: Physician Assistant

## 2021-11-27 DIAGNOSIS — M47812 Spondylosis without myelopathy or radiculopathy, cervical region: Secondary | ICD-10-CM | POA: Diagnosis not present

## 2021-12-02 ENCOUNTER — Emergency Department (HOSPITAL_BASED_OUTPATIENT_CLINIC_OR_DEPARTMENT_OTHER)
Admission: EM | Admit: 2021-12-02 | Discharge: 2021-12-02 | Disposition: A | Discharge status: Home / Self Care | Payer: Medicare Other | Attending: Emergency Medicine | Admitting: Emergency Medicine

## 2021-12-02 ENCOUNTER — Encounter (HOSPITAL_BASED_OUTPATIENT_CLINIC_OR_DEPARTMENT_OTHER): Payer: Self-pay | Admitting: Emergency Medicine

## 2021-12-02 ENCOUNTER — Other Ambulatory Visit: Payer: Self-pay

## 2021-12-02 DIAGNOSIS — L03114 Cellulitis of left upper limb: Secondary | ICD-10-CM | POA: Insufficient documentation

## 2021-12-02 DIAGNOSIS — I2510 Atherosclerotic heart disease of native coronary artery without angina pectoris: Secondary | ICD-10-CM | POA: Diagnosis not present

## 2021-12-02 DIAGNOSIS — Z87442 Personal history of urinary calculi: Secondary | ICD-10-CM | POA: Diagnosis not present

## 2021-12-02 DIAGNOSIS — J449 Chronic obstructive pulmonary disease, unspecified: Secondary | ICD-10-CM | POA: Diagnosis not present

## 2021-12-02 MED ORDER — CEFDINIR 300 MG PO CAPS: 300.0000 mg | ORAL_CAPSULE | Freq: Two times a day (BID) | ORAL | Status: DC

## 2021-12-02 MED ORDER — CEFDINIR 300 MG PO CAPS: 300.0000 mg | ORAL_CAPSULE | Freq: Two times a day (BID) | ORAL | 0 refills | Status: AC

## 2021-12-02 MED ORDER — CEFDINIR 300 MG PO CAPS: 300.0000 mg | ORAL_CAPSULE | Freq: Two times a day (BID) | ORAL | 0 refills | Status: DC

## 2021-12-02 NOTE — ED Triage Notes (Signed)
Patient given discharge instructions. Questions were answered. Patient verbalized understanding of discharge instructions and care at home.  

## 2021-12-02 NOTE — ED Provider Notes (Signed)
Emergency Department Provider Note   I have reviewed the triage vital signs and the nursing notes.   HISTORY  Chief Complaint Abscess   HPI [name] [name] is a 81 y.o. male with a past history reviewed below presents emergency department for evaluation of irritation and mild erythema to the left groin.  He said several days of symptoms and 2 mornings ago noticed some drainage from the area which has since stopped.  No fevers or chills.  No difficulty with urination.  Continues to have bowel movements normally.  No prior history of abscess requiring incision and drainage.  Past Medical History:  Diagnosis Date   Adenomatous polyp 2007   Anginal pain ([place])    Chronic    Anxiety    Arthritis    "my body"   B12 deficiency    BPH (benign prostatic hyperplasia)    Chronic back pain    "all over"   Chronic low back pain    COPD (chronic obstructive pulmonary disease) (HCC)    Coronary artery disease 2009   DDD (degenerative disc disease), cervical    DDD (degenerative disc disease), lumbosacral    DDD (degenerative disc disease), thoracic    Depression    Gastroesophageal reflux    Gynecomastia 06/2009   History of kidney stones    Hyperglycemia    Hyperlipidemia LDL goal < 70    MI (myocardial infarction) ([place]) 08/1989; 1993   OSA on CPAP    "have mask; don't wear it" (02/02/2014) 09/25/15- now has a BiPAp- doesn'tt use it   Peptic ulcer disease 1959   Pre-diabetes    Right bundle branch block    Tobacco abuse    Vertebral compression fracture ([place]) 1970    Review of Systems  Constitutional: No fever/chills Cardiovascular: Denies chest pain. Respiratory: Denies shortness of breath. Gastrointestinal: No abdominal pain.   Skin: Positive rash.   ____________________________________________   PHYSICAL EXAM:  VITAL SIGNS: ED Triage Vitals  Enc Vitals Group     BP 12/02/21 1541 (!) 130/90     Pulse Rate 12/02/21 1541 61     Resp 12/02/21 1541 18     Temp 12/02/21  1541 (!) 97.5 F (36.4 C)     Temp Source 12/02/21 1541 Oral     SpO2 12/02/21 1541 100 %   Constitutional: Alert and oriented. Well appearing and in no acute distress. Eyes: Conjunctivae are normal.  Head: Atraumatic. Nose: No congestion/rhinnorhea. Mouth/Throat: Mucous membranes are moist.  Neck: No stridor.   Cardiovascular:  Good peripheral circulation.  Respiratory: Normal respiratory effort.  Gastrointestinal: No distention.  Musculoskeletal: No gross deformities of extremities. Neurologic:  Normal speech and language.  Skin:  Skin is warm and dry.  Mild erythema to the left inguinal crease with 1 cm circular area of erythema and induration.  No fluctuance or skin breakdown.  No palpable masses or hernias.  Erythema does not extend to the scrotum or perineum.    ____________________________________________   PROCEDURES  Procedure(s) performed:   Procedures  None  ____________________________________________   INITIAL IMPRESSION / ASSESSMENT AND PLAN / ED COURSE  Pertinent labs & imaging results that were available during my care of the patient were reviewed by me and considered in my medical decision making (see chart for details).   This patient is Presenting for Evaluation of rash, which does require a range of treatment options, and is a complaint that involves a high risk of morbidity and mortality.  The Differential Diagnoses  include cellulitis, abscess, Fournier's gangrene, hernia, scrotal cellulitis, etc.  Critical Interventions-    Medications  cefdinir (OMNICEF) capsule 300 mg (has no administration in time range)    Medical Decision Making: Summary:  Patient presents emergency department left inguinal cellulitis which is very mild.  Sounds like there was some draining 2 days ago.  I do not appreciate any fluctuance to the area.  Seems like fairly minimal, nonpurulent cellulitis.  No concern for developing Fournier's at this time.  Plan for antibiotics  and PCP follow-up in 2 days for repeat rash check.  Disposition: discharge  ____________________________________________  FINAL CLINICAL IMPRESSION(S) / ED DIAGNOSES  Final diagnoses:  Cellulitis of left upper extremity     NEW OUTPATIENT MEDICATIONS STARTED DURING THIS VISIT:  Current Discharge Medication List     START taking these medications   Details  cefdinir (OMNICEF) 300 MG capsule Take 1 capsule (300 mg total) by mouth 2 (two) times daily for 7 days. Qty: 14 capsule, Refills: 0        Note:  This document was prepared using Dragon voice recognition software and may include unintentional dictation errors.  Nanda Quinton, MD, [facility]    Sora Olivo, [facility], MD 12/02/21 [phone]

## 2021-12-02 NOTE — ED Triage Notes (Signed)
Lump in left groin , 3-4 days, red , draining clear

## 2021-12-02 NOTE — Discharge Instructions (Signed)
Follow with your primary care physician in the next 48 hours for recheck of your groin area.  Please continue antibiotics until you have completed the course.

## 2021-12-02 NOTE — ED Notes (Signed)
Dc instructions reviewed with patient. Patient voiced understanding. Dc with belongings.  °

## 2021-12-07 ENCOUNTER — Telehealth: Payer: Self-pay

## 2021-12-07 DIAGNOSIS — R350 Frequency of micturition: Secondary | ICD-10-CM | POA: Diagnosis not present

## 2021-12-07 NOTE — Telephone Encounter (Signed)
     Patient  visit on 10/8  at [facility]  Have you been able to follow up with your primary care physician? YES  The patient was or was not able to obtain any needed medicine or equipment. YES  Are there diet recommendations that you are having difficulty following? NA  Patient expresses understanding of discharge instructions and education provided has no other needs at this time.  [place], [facility], Care Management  [phone] [address], [place], [place] [address] Phone: [phone] Email: [email or web address]

## 2022-01-02 NOTE — Progress Notes (Signed)
Office Visit    Patient Name: [name] [name] Date of Encounter: 01/04/2022  Primary Care Provider:  Charlane Ferretti, MD Primary Cardiologist:  Sinclair Grooms, MD Primary Electrophysiologist: None  Chief Complaint    [name] [name] is a 81 y.o. male with PMH of CAD s/p MI 15 with CTO of RCA, HTN, HLD, DM type II, CKD, COPD, OSA, RBBB, tobacco abuse, memory loss (on Aricept) PAD who presents for annual follow-up of CAD.  Past Medical History    Past Medical History:  Diagnosis Date   Adenomatous polyp 2007   Anginal pain ([place])    Chronic    Anxiety    Arthritis    "my body"   B12 deficiency    BPH (benign prostatic hyperplasia)    Chronic back pain    "all over"   Chronic low back pain    COPD (chronic obstructive pulmonary disease) (HCC)    Coronary artery disease 2009   DDD (degenerative disc disease), cervical    DDD (degenerative disc disease), lumbosacral    DDD (degenerative disc disease), thoracic    Depression    Gastroesophageal reflux    Gynecomastia 06/2009   History of kidney stones    Hyperglycemia    Hyperlipidemia LDL goal < 70    MI (myocardial infarction) ([place]) 08/1989; 1993   OSA on CPAP    "have mask; don't wear it" (02/02/2014) 09/25/15- now has a BiPAp- doesn'tt use it   Peptic ulcer disease 1959   Pre-diabetes    Right bundle branch block    Tobacco abuse    Vertebral compression fracture ([place]) 1970   Past Surgical History:  Procedure Laterality Date   BOTOX INJECTION  08/08/2020   Procedure: BOTOX INJECTION;  Surgeon: Clarene Essex, MD;  Location: [facility];  Service: Endoscopy;;   BOTOX INJECTION  06/11/2021   Procedure: BOTOX INJECTION;  Surgeon: Clarene Essex, MD;  Location: [facility];  Service: Gastroenterology;;   BYPASS GRAFT POPLITEAL TO POPLITEAL Left 06/2005   Dr Donnetta Hutching   BYPASS GRAFT POPLITEAL TO POPLITEAL Right 02/2001   Dr Deon Pilling   CARDIAC CATHETERIZATION  1991; 1993; 08/2012; 11/2013; 02/02/2014   I've had 6-7 total   CARDIAC  CATHETERIZATION  02/02/2014   Procedure: LEFT HEART CATH AND CORONARY ANGIOGRAPHY;  Surgeon: Jettie Booze, MD;  mild disease in LAD/CFX, RCA w/ CTO, unsuccessfull PCI both w/ antegrade and retrograde approaches    CARPAL TUNNEL RELEASE Left 06/01/2019   Procedure: LEFT CARPAL TUNNEL RELEASE;  Surgeon: Daryll Brod, MD;  Location: [facility];  Service: Orthopedics;  Laterality: Left;  FOREARM BLOCK   CATARACT EXTRACTION W/ INTRAOCULAR LENS  IMPLANT, BILATERAL Bilateral ~ 2012   COLONOSCOPY W/ POLYPECTOMY     CORONARY ANGIOPLASTY  1990's   CYSTOSCOPY WITH STENT PLACEMENT Right 11/19/2019   Procedure: CYSTOSCOPY WITH RIGHT URETERAL STENT PLACEMENT;  Surgeon: Lucas Mallow, MD;  Location: [facility];  Service: Urology;  Laterality: Right;   CYSTOSCOPY/URETEROSCOPY/HOLMIUM LASER/STENT PLACEMENT Right 12/22/2019   Procedure: CYSTOSCOPY RIGHT URETEROSCOPY/HOLMIUM LASER/STENT PLACEMENT;  Surgeon: Ardis Hughs, MD;  Location: [facility];  Service: Urology;  Laterality: Right;   ESOPHAGOGASTRODUODENOSCOPY (EGD) WITH ESOPHAGEAL DILATION  11/2007   ESOPHAGOGASTRODUODENOSCOPY (EGD) WITH PROPOFOL N/A 09/28/2015   Procedure: ESOPHAGOGASTRODUODENOSCOPY (EGD) WITH PROPOFOL;  Surgeon: Clarene Essex, MD;  Location: [facility];  Service: Endoscopy;  Laterality: N/A;   ESOPHAGOGASTRODUODENOSCOPY (EGD) WITH PROPOFOL N/A 09/09/2017   Procedure: ESOPHAGOGASTRODUODENOSCOPY (EGD) WITH PROPOFOL;  Surgeon:  Clarene Essex, MD;  Location: [facility];  Service: Endoscopy;  Laterality: N/A;   ESOPHAGOGASTRODUODENOSCOPY (EGD) WITH PROPOFOL N/A 03/09/2020   Procedure: ESOPHAGOGASTRODUODENOSCOPY (EGD) WITH PROPOFOL WITH DIL;  Surgeon: Clarene Essex, MD;  [facility];  Service: Endoscopy;  Laterality: N/A;   ESOPHAGOGASTRODUODENOSCOPY (EGD) WITH PROPOFOL N/A 08/08/2020   Procedure: ESOPHAGOGASTRODUODENOSCOPY (EGD) WITH PROPOFOL;  Surgeon: Clarene Essex, MD;  Location: [place] [facility];  Service: Endoscopy;   Laterality: N/A;  Probable Botox injection, possible dilatation   ESOPHAGOGASTRODUODENOSCOPY (EGD) WITH PROPOFOL N/A 06/11/2021   Procedure: ESOPHAGOGASTRODUODENOSCOPY (EGD) WITH PROPOFOL;  Surgeon: Clarene Essex, MD;  [facility];  Service: Gastroenterology;  Laterality: N/A;   EYE SURGERY Bilateral    JOINT REPLACEMENT     LEFT HEART CATH AND CORONARY ANGIOGRAPHY N/A 08/10/2018   Procedure: LEFT HEART CATH AND CORONARY ANGIOGRAPHY;  Surgeon: Troy Sine, MD;  Location: [facility];  Service: Cardiovascular;  Laterality: N/A;   LEFT HEART CATHETERIZATION WITH CORONARY ANGIOGRAM N/A 09/03/2012   Procedure: LEFT HEART CATHETERIZATION WITH CORONARY ANGIOGRAM;  Surgeon: Sinclair Grooms, MD;  Location: [facility] CATH LAB;  Service: Cardiovascular;  Laterality: N/A;   LEFT HEART CATHETERIZATION WITH CORONARY ANGIOGRAM N/A 12/01/2013   Procedure: LEFT HEART CATHETERIZATION WITH CORONARY ANGIOGRAM;  Surgeon: Sinclair Grooms, MD;  Location: [facility] CATH LAB;  Service: Cardiovascular;  Laterality: N/A;   LOWER EXTREMITY ANGIOGRAM Left 05/2005   L pop-pop BPG patent   NAILBED REPAIR Right 09/17/2016   Procedure: ABLATION NAILBED RIGHT INDEX;  Surgeon: Daryll Brod, MD;  Location: [facility];  Service: Orthopedics;  Laterality: Right;   NASAL SEPTUM SURGERY  1977   PENILE PROSTHESIS IMPLANT  1990's   SAVORY DILATION N/A 09/28/2015   Procedure: SAVORY DILATION;  Surgeon: Clarene Essex, MD;  Location: [facility] ENDOSCOPY;  Service: Endoscopy;  Laterality: N/A;  have balloons available   SAVORY DILATION N/A 09/09/2017   Procedure: SAVORY DILATION;  Surgeon: Clarene Essex, MD;  [facility];  Service: Endoscopy;  Laterality: N/A;   SAVORY DILATION N/A 03/09/2020   Procedure: SAVORY DILATION;  Surgeon: Clarene Essex, MD;  Location: [facility];  Service: Endoscopy;  Laterality: N/A;   SCROTAL SURGERY  08/1999   pump revision/notes 07/11/2010   SKIN FULL THICKNESS GRAFT Right 09/17/2016    Procedure: SKIN GRAFT FROM UPPER ARM;  Surgeon: Daryll Brod, MD;  Location: [facility];  Service: Orthopedics;  Laterality: Right;   TOTAL KNEE ARTHROPLASTY Left 01/2009    Allergies  Allergies  Allergen Reactions   Bupropion Hcl Er (Sr) Other (See Comments)    Low BP   Doxycycline Other (See Comments)    Dizziness   Flomax [Tamsulosin] Other (See Comments)    Dizziness    Lipitor [Atorvastatin] Other (See Comments)    Muscle weakness   Rhubarb ([place]) Other (See Comments)    From childhood- exact reaction not recalled, but is allergic   Sulfa Antibiotics Hives   Sulfacetamide Sodium Hives   Clindamycin/Lincomycin Rash   Crestor [Rosuvastatin] Other (See Comments)    MUSCLE ACHES   Other Hives, Itching, Rash and Other (See Comments)    UNNAMED ANTIBIOTIC- SEVERE SYMPTOMS  [place]- From childhood- exact reaction not recalled, but is allergic   Vytorin [Ezetimibe-Simvastatin] Other (See Comments)    Bodyaches    History of Present Illness    [name] [name]  is a 81 year old male with the above mention past medical history who presents today for follow-up of  coronary artery disease.  Mr. Weihe is a cardiac history that dates back to the early 1990s when he suffered an MI and had a RTO of RCA with collaterals.  He has a history of PAD with bypass graft completed in 2003 with right popliteal to popliteal and left popliteal to popliteal and 2007.  He underwent left heart cath in 2014 and 2015 with unsuccessful PCI with antegrade and retrograde approaches.  He had a left heart cath performed 07/2018 with recommendations of medical therapy.  He was admitted 07/2020 sepsis/CAP and hypotension with AKI.  He was found to be dehydrated improved with rehydration.  He was last seen by Dr. [name] 12/2020 for follow-up.  He was tolerating isosorbide 30 mg twice daily.  He did endorse some orthostatic dizziness with a his medication beta-blocker was discontinued by PCP due to complaint.  No  further medication changes were made at that time and secondary prevention was encouraged with smoking cessation and lifestyle modifications  Mr. Florene Glen presents today with his daughter for follow-up.  Since last being seen in the office patient reports that he has been experiencing dizziness with standing and low blood pressures.  Orthostatics were positive today from lying to sitting blood pressure currently is 106/62 with a rate of 55 bpm.  He is currently compliant with his medications and denies any adverse reactions.  Through a shared decision patient has agreed to discontinue Crestor due to increased myalgias.  He is currently chest pain-free denies any palpitations.  Patient denies  dyspnea, PND, orthopnea, nausea, vomiting, dizziness, syncope, edema, weight gain, or early satiety.  Home Medications    Current Outpatient Medications  Medication Sig Dispense Refill   acetaminophen (TYLENOL) 500 MG tablet Take 1,000 mg by mouth every 6 (six) hours as needed for mild pain.     albuterol (VENTOLIN HFA) 108 (90 Base) MCG/ACT inhaler Inhale 2 puffs into the lungs every 6 (six) hours as needed for wheezing or shortness of breath.     ANORO ELLIPTA 62.5-25 MCG/ACT AEPB Inhale 1 puff into the lungs daily.     aspirin EC 81 MG tablet Take 1 tablet (81 mg total) by mouth daily. Resume after 4 days.     Cholecalciferol (VITAMIN D3) 50 MCG (2000 UT) TABS Take 2,000 Units by mouth every morning.     docusate sodium (COLACE) 100 MG capsule Take 100 mg by mouth daily as needed for mild constipation.     donepezil (ARICEPT) 10 MG tablet Take 10 mg by mouth at bedtime.     famotidine (PEPCID) 10 MG tablet Take 10 mg by mouth at bedtime.     finasteride (PROSCAR) 5 MG tablet Take 5 mg by mouth at bedtime.     HYDROcodone-acetaminophen (NORCO/VICODIN) 5-325 MG tablet Take 1 tablet by mouth every 6 (six) hours as needed for severe pain.     isosorbide mononitrate (IMDUR) 30 MG 24 hr tablet Take 1 tablet (30 mg  total) by mouth 2 (two) times daily. 180 tablet 1   nitroGLYCERIN (NITROSTAT) 0.4 MG SL tablet Place 0.4 mg under the tongue See admin instructions. Dissolve 0.4 mg sublingually 10 minutes before each meal for esophageal spasms and as needed for chest pain     pantoprazole (PROTONIX) 40 MG tablet Take 40 mg by mouth 2 (two) times daily with a meal.     polyethylene glycol powder (GLYCOLAX/MIRALAX) 17 GM/SCOOP powder Take 17 g by mouth daily as needed for mild constipation (MIX AND DRINK).  ranolazine (RANEXA) 1000 MG SR tablet TAKE ONE TABLET BY MOUTH TWICE DAILY 180 tablet 3   vitamin B-12 (CYANOCOBALAMIN) 1000 MCG tablet Take 1,000 mcg by mouth every morning.     No current facility-administered medications for this visit.     Review of Systems  Please see the history of present illness.    (+) Dizziness (+) Lightheadedness  All other systems reviewed and are otherwise negative except as noted above.  Orthostatic VS for the past 24 hrs (Last 3 readings):  BP- Lying Pulse- Lying BP- Sitting Pulse- Sitting BP- Standing at 0 minutes Pulse- Standing at 0 minutes BP- Standing at 3 minutes Pulse- Standing at 3 minutes  01/04/22 1204 117/70 53 97/57 56 107/59 55 122/68 55     Physical Exam    Wt Readings from Last 3 Encounters:  01/04/22 201 lb (91.2 kg)  01/22/21 195 lb 6.4 oz (88.6 kg)  10/18/20 183 lb 12.8 oz (83.4 kg)   VS: Vitals:   01/04/22 1117  BP: 106/62  Pulse: (!) 55  SpO2: 95%  ,Body mass index is 29.68 kg/m.  Constitutional:      Appearance: Healthy appearance. Not in distress.  Neck:     Vascular: JVD normal.  Pulmonary:     Effort: Pulmonary effort is normal.     Breath sounds: No wheezing. No rales. Diminished in the bases Cardiovascular:     Normal rate. Regular rhythm. Normal S1. Normal S2.      Murmurs: There is no murmur.  Edema:    Peripheral edema absent.  Abdominal:     Palpations: Abdomen is soft non tender. There is no hepatomegaly.  Skin:     General: Skin is warm and dry.  Neurological:     General: No focal deficit present.     Mental Status: Alert and oriented to person, place and time.     Cranial Nerves: Cranial nerves are intact.  EKG/LABS/Other Studies Reviewed    ECG personally reviewed by me today -sinus bradycardia with right bundle branch block with TWI in leads V4 and lead III consistent with previous EKG with a rate of 55 bpm and first-degree AVB     Lab Results  Component Value Date   WBC 7.1 08/09/2020   HGB 11.6 (L) 08/09/2020   HCT 34.3 (L) 08/09/2020   MCV 96.6 08/09/2020   PLT 111 (L) 08/09/2020   Lab Results  Component Value Date   CREATININE 1.46 (H) 08/09/2020   BUN 20 08/09/2020   NA 135 08/09/2020   K 3.4 (L) 08/09/2020   CL 100 08/09/2020   CO2 27 08/09/2020   Lab Results  Component Value Date   ALT 17 08/05/2020   AST 20 08/05/2020   ALKPHOS 46 08/05/2020   BILITOT 0.8 08/05/2020   Lab Results  Component Value Date   CHOL 125 07/30/2020   HDL 44 07/30/2020   LDLCALC 59 07/30/2020   TRIG 109 07/30/2020   CHOLHDL 2.8 07/30/2020    Lab Results  Component Value Date   HGBA1C 5.4 12/13/2019    Assessment & Plan    1.  History of coronary artery disease: -s/p CTO of RCA with unsuccessful attempts at PCI with last occurring 2015. -Today patient reports no chest pain currently -Continue GDMT with ASA 81 mg, Imdur 30 mg, Ranexa 1000 mg daily and Crestor 10 mg  2.  Orthostatic hypotension: -Patient's blood pressure today was 106/62 -Patient was positive for orthostasis lying to sitting position with normal  response from sitting to standing -He was encouraged to increase fluid intake and add a salty snack to his diet -He will continue to monitor blood pressures and contact our office with the findings.  3.  Hyperlipidemia: -Patient has elected to discontinue Crestor due to myalgias will not resume at this time.  4.  History of COPD: -Not currently followed by pulmonology and  is followed by PCP  Disposition: Follow-up with Belva Crome III, MD or APP in 1 months     Medication Adjustments/Labs and Tests Ordered: Current medicines are reviewed at length with the patient today.  Concerns regarding medicines are outlined above.   Signed, Mable Fill, Marissa Nestle, NP 01/04/2022, 12:15 PM [facility]  Note:  This document was prepared using Dragon voice recognition software and may include unintentional dictation errors.

## 2022-01-04 ENCOUNTER — Encounter: Payer: Self-pay | Admitting: Nurse Practitioner

## 2022-01-04 ENCOUNTER — Ambulatory Visit: Payer: Medicare Other | Attending: Physician Assistant | Admitting: Nurse Practitioner

## 2022-01-04 VITALS — BP 106/62 | HR 55 | Wt 201.0 lb

## 2022-01-04 DIAGNOSIS — I951 Orthostatic hypotension: Secondary | ICD-10-CM

## 2022-01-04 DIAGNOSIS — E785 Hyperlipidemia, unspecified: Secondary | ICD-10-CM

## 2022-01-04 DIAGNOSIS — R42 Dizziness and giddiness: Secondary | ICD-10-CM

## 2022-01-04 DIAGNOSIS — J439 Emphysema, unspecified: Secondary | ICD-10-CM

## 2022-01-04 DIAGNOSIS — I25110 Atherosclerotic heart disease of native coronary artery with unstable angina pectoris: Secondary | ICD-10-CM

## 2022-01-04 NOTE — Patient Instructions (Signed)
Medication Instructions:   Your physician recommends that you continue on your current medications as directed. Please refer to the Current Medication list given to you today.   *If you need a refill on your cardiac medications before your next appointment, please call your pharmacy*   Lab Work: [place]   If you have labs (blood work) drawn today and your tests are completely normal, you will receive your results only by: [place] (if you have MyChart) OR A paper copy in the mail If you have any lab test that is abnormal or we need to change your treatment, we will call you to review the results.   Testing/Procedures: NONE ORDERED  TODAY    Follow-Up: At [facility], you and your health needs are our priority.  As part of our continuing mission to provide you with exceptional heart care, we have created designated Provider Care Teams.  These Care Teams include your primary Cardiologist (physician) and Advanced Practice Providers (APPs -  Physician Assistants and Nurse Practitioners) who all work together to provide you with the care you need, when you need it.  We recommend signing up for the patient portal called "MyChart".  Sign up information is provided on this After Visit Summary.  MyChart is used to connect with patients for Virtual Visits (Telemedicine).  Patients are able to view lab/test results, encounter notes, upcoming appointments, etc.  Non-urgent messages can be sent to your provider as well.   To learn more about what you can do with MyChart, go to [email or web address].    Your next appointment:   1 month(s)  The format for your next appointment:   In Person  Provider:   Ambrose Pancoast, NP       Other Instructions  [place] DIET TOP HELP WITH LIGHT DIZZINESS  Important Information About Sugar

## 2022-01-07 ENCOUNTER — Other Ambulatory Visit: Payer: Self-pay | Admitting: Interventional Cardiology

## 2022-02-06 ENCOUNTER — Ambulatory Visit: Payer: Medicare Other | Admitting: Nurse Practitioner

## 2022-02-21 NOTE — Progress Notes (Signed)
Cardiology Office Note:    Date:  03/05/2022   ID:  [name] [name], DOB October 02, 1940, MRN [id number]  PCP:  Charlane Ferretti, MD   [facility] Providers Cardiologist:  Larae Grooms, MD Cardiology APP:  Murrell [facility]verse     Referring MD: Charlane Ferretti, MD   Chief Complaint:  Follow-up     History of Present Illness:   [name] [name] is a 81 y.o. male with    PMH of CAD s/p MI 30 with CTO of RCA, HTN, HLD, DM type II, CKD, COPD, OSA, RBBB, tobacco abuse, memory loss (on Aricept) PAD    Patient saw [name] [name] 01/04/22 and had dizziness and hypotension. He was orthostatic in office.  He was asked to increase hydration and monitor BP's.  Patient comes in with his daughter. She says her dad is fine as long as he's staying hydrated. Complains of muscle aches and pains on rosuvastatin so he stopped it. Overall he is feeling good. Taking much less NTG but sometimes takes it for dizziness. No recent falls.     Past Medical History:  Diagnosis Date   Adenomatous polyp 2007   Anginal pain ([place])    Chronic    Anxiety    Arthritis    "my body"   B12 deficiency    BPH (benign prostatic hyperplasia)    Chronic back pain    "all over"   Chronic low back pain    COPD (chronic obstructive pulmonary disease) (HCC)    Coronary artery disease 2009   DDD (degenerative disc disease), cervical    DDD (degenerative disc disease), lumbosacral    DDD (degenerative disc disease), thoracic    Depression    Gastroesophageal reflux    Gynecomastia 06/2009   History of kidney stones    Hyperglycemia    Hyperlipidemia LDL goal < 70    MI (myocardial infarction) ([place]) 08/1989; 1993   OSA on CPAP    "have mask; don't wear it" (02/02/2014) 09/25/15- now has a BiPAp- doesn'tt use it   Peptic ulcer disease 1959   Pre-diabetes    Right bundle branch block    Tobacco abuse    Vertebral compression fracture ([place]) 1970   Current Medications: Current Meds  Medication Sig   acetaminophen  (TYLENOL) 500 MG tablet Take 1,000 mg by mouth every 6 (six) hours as needed for mild pain.   albuterol (VENTOLIN HFA) 108 (90 Base) MCG/ACT inhaler Inhale 2 puffs into the lungs every 6 (six) hours as needed for wheezing or shortness of breath.   ANORO ELLIPTA 62.5-25 MCG/ACT AEPB Inhale 1 puff into the lungs daily.   aspirin EC 81 MG tablet Take 1 tablet (81 mg total) by mouth daily. Resume after 4 days.   Cholecalciferol (VITAMIN D3) 50 MCG (2000 UT) TABS Take 2,000 Units by mouth every morning.   docusate sodium (COLACE) 100 MG capsule Take 100 mg by mouth daily as needed for mild constipation.   donepezil (ARICEPT) 10 MG tablet Take 10 mg by mouth at bedtime.   famotidine (PEPCID) 10 MG tablet Take 10 mg by mouth at bedtime.   finasteride (PROSCAR) 5 MG tablet Take 5 mg by mouth at bedtime.   HYDROcodone-acetaminophen (NORCO/VICODIN) 5-325 MG tablet Take 1 tablet by mouth every 6 (six) hours as needed for severe pain.   isosorbide mononitrate (IMDUR) 30 MG 24 hr tablet TAKE 1 TABLET BY MOUTH TWICE  DAILY   nitroGLYCERIN (NITROSTAT) 0.4 MG SL tablet  Place 0.4 mg under the tongue See admin instructions. Dissolve 0.4 mg sublingually 10 minutes before each meal for esophageal spasms and as needed for chest pain   pantoprazole (PROTONIX) 40 MG tablet Take 40 mg by mouth 2 (two) times daily with a meal.   polyethylene glycol powder (GLYCOLAX/MIRALAX) 17 GM/SCOOP powder Take 17 g by mouth daily as needed for mild constipation (MIX AND DRINK).   [START ON 03/06/2022] pravastatin (PRAVACHOL) 10 MG tablet Take 1 tablet (10 mg total) by mouth 3 (three) times a week.   ranolazine (RANEXA) 1000 MG SR tablet TAKE ONE TABLET BY MOUTH TWICE DAILY   vitamin B-12 (CYANOCOBALAMIN) 1000 MCG tablet Take 1,000 mcg by mouth every morning.    Allergies:   Bupropion hcl er (sr), Doxycycline, Flomax [tamsulosin], Lipitor [atorvastatin], Rhubarb (rhein), Sulfa antibiotics, Sulfacetamide sodium, Clindamycin/lincomycin,  Crestor [rosuvastatin], Other, and Vytorin [ezetimibe-simvastatin]   Social History   Tobacco Use   Smoking status: Former    Packs/day: 0.25    Years: 59.00    Total pack years: 14.75    Types: Cigarettes   Smokeless tobacco: Former    Types: Chew   Tobacco comments:    patient states quit around month ago(december 2021)  Vaping Use   Vaping Use: Never used  Substance Use Topics   Alcohol use: No    Alcohol/week: 0.0 standard drinks of alcohol   Drug use: No    Family Hx: The patient's family history includes Alzheimer's disease (age of onset: 55) in his mother; Diabetes in his daughter; Diabetes (age of onset: 35) in his father; Fibromyalgia (age of onset: 84) in his sister; Hypertension in his son; Obesity in his daughter; Peripheral vascular disease in his father; Pneumonia in his father.  ROS     Physical Exam:    VS:  BP 120/60   Pulse 64   Ht '5\' 9"'$  (1.753 m)   Wt 197 lb 3.2 oz (89.4 kg)   SpO2 95%   BMI 29.12 kg/m     Wt Readings from Last 3 Encounters:  03/05/22 197 lb 3.2 oz (89.4 kg)  01/04/22 201 lb (91.2 kg)  01/22/21 195 lb 6.4 oz (88.6 kg)    Physical Exam  GEN: Well nourished, well developed, in no acute distress  Neck: no JVD, carotid bruits, or masses Cardiac:RRR; no murmurs, rubs, or gallops  Respiratory:  decreased breath sounds but clear to auscultation bilaterally, normal work of breathing GI: soft, nontender, nondistended, + BS Ext: without cyanosis, clubbing, or edema, Good distal pulses bilaterally Neuro:  Alert and Oriented x 3,  Psych: euthymic mood, full affect        EKGs/Labs/Other Test Reviewed:    EKG:  EKG is  not ordered today.    Recent Labs: No results found for requested labs within last 365 days.   Recent Lipid Panel No results for input(s): "CHOL", "TRIG", "HDL", "VLDL", "LDLCALC", "LDLDIRECT" in the last 8760 hours.   Prior CV Studies:   Echo 07/2020   IMPRESSIONS     1. Left ventricular ejection fraction,  by estimation, is 55 to 60%. The  left ventricle has normal function. The left ventricle has no regional  wall motion abnormalities. Left ventricular diastolic parameters are  consistent with Grade I diastolic  dysfunction (impaired relaxation).   2. Right ventricular systolic function is normal. The right ventricular  size is normal. There is normal pulmonary artery systolic pressure. The  estimated right ventricular systolic pressure is 24.4 mmHg.   3.  The mitral valve is normal in structure. No evidence of mitral valve  regurgitation. No evidence of mitral stenosis.   4. The aortic valve has an indeterminant number of cusps. Aortic valve  regurgitation is not visualized. Mild aortic valve sclerosis is present,  with no evidence of aortic valve stenosis.   5. The inferior vena cava is dilated in size with <50% respiratory  variability, suggesting right atrial pressure of 15 mmHg.   FINDINGS   Left Ventricle: Left ventricular ejection fraction, by estimation, is 55  to 60%. The left ventricle has normal function. The left ventricle has no  regional wall motion abnormalities. Definity contrast agent was given IV  to delineate the left ventricular   endocardial borders. The left ventricular internal cavity size was normal  in size. There is no left ventricular hypertrophy. Left ventricular  diastolic parameters are consistent with Grade I diastolic dysfunction  (impaired relaxation). Normal left  ventricular filling pressure.     Risk Assessment/Calculations/Metrics:              ASSESSMENT & PLAN:   No problem-specific Assessment & Plan notes found for this encounter.   Dizziness/Orthostatic hypotension if he doesn't stay hydrated. He's doing much better. They don't want to change his meds because they are helping his angina. Continue hydration and call if recurrent dizziness. Check labs.  CAD CTO RCA on cath 2020-occasional angina relieved with NTG. Continue ranexa and  Imdur  HTN-BP tends to run low  HLD-intolerant to rosuvastatin and lipitor. Will try pravastatin 10 mg 3 times/week and check lipids in 3 months. If he doesn't tolerate refer to lipid clinic  DM2 managed by PCP  CKD-check labs today  COPD-no longer smokes.            Dispo:  No follow-ups on file.   Medication Adjustments/Labs and Tests Ordered: Current medicines are reviewed at length with the patient today.  Concerns regarding medicines are outlined above.  Tests Ordered: Orders Placed This Encounter  Procedures   CBC   Comprehensive metabolic panel   TSH   Lipid Profile   HgB A1c   Lipid Profile   Medication Changes: Meds ordered this encounter  Medications   pravastatin (PRAVACHOL) 10 MG tablet    Sig: Take 1 tablet (10 mg total) by mouth 3 (three) times a week.    Dispense:  45 tablet    Refill:  3   Signed, Ermalinda Barrios, PA-C  03/05/2022 10:24 AM    [facility], [place], [place]  [address] Phone: [phone]; Fax: [phone]

## 2022-03-05 ENCOUNTER — Encounter: Payer: Self-pay | Admitting: Physician Assistant

## 2022-03-05 ENCOUNTER — Ambulatory Visit: Payer: Medicare Other | Attending: Nurse Practitioner | Admitting: Physician Assistant

## 2022-03-05 VITALS — BP 120/60 | HR 64 | Ht 69.0 in | Wt 197.2 lb

## 2022-03-05 DIAGNOSIS — I10 Essential (primary) hypertension: Secondary | ICD-10-CM | POA: Diagnosis not present

## 2022-03-05 DIAGNOSIS — E785 Hyperlipidemia, unspecified: Secondary | ICD-10-CM | POA: Diagnosis not present

## 2022-03-05 DIAGNOSIS — R42 Dizziness and giddiness: Secondary | ICD-10-CM | POA: Diagnosis not present

## 2022-03-05 DIAGNOSIS — N183 Chronic kidney disease, stage 3 unspecified: Secondary | ICD-10-CM

## 2022-03-05 DIAGNOSIS — J439 Emphysema, unspecified: Secondary | ICD-10-CM

## 2022-03-05 DIAGNOSIS — I25110 Atherosclerotic heart disease of native coronary artery with unstable angina pectoris: Secondary | ICD-10-CM

## 2022-03-05 DIAGNOSIS — G4733 Obstructive sleep apnea (adult) (pediatric): Secondary | ICD-10-CM

## 2022-03-05 MED ORDER — PRAVASTATIN SODIUM 10 MG PO TABS
10.0000 mg | ORAL_TABLET | ORAL | 3 refills | Status: AC
Start: 2022-03-06 — End: ?

## 2022-03-05 NOTE — Patient Instructions (Addendum)
Medication Instructions:  1.Start pravastatin 10 mg, 3 times a week *If you need a refill on your cardiac medications before your next appointment, please call your pharmacy*   Lab Work: CBC, CMET, TSH, Lipid and A1C--TODAY Fasting lipid panel in 3 months If you have labs (blood work) drawn today and your tests are completely normal, you will receive your results only by: [place] (if you have MyChart) OR A paper copy in the mail If you have any lab test that is abnormal or we need to change your treatment, we will call you to review the results.   Follow-Up: At [facility], you and your health needs are our priority.  As part of our continuing mission to provide you with exceptional heart care, we have created designated Provider Care Teams.  These Care Teams include your primary Cardiologist (physician) and Advanced Practice Providers (APPs -  Physician Assistants and Nurse Practitioners) who all work together to provide you with the care you need, when you need it.  Your next appointment:   6 month(s)  The format for your next appointment:   In Person  Provider:   Ermalinda Barrios, PA-C  Then, Larae Grooms, MD will plan to see you again in 1 year.  Important Information About Sugar

## 2022-03-06 LAB — CBC
Hematocrit: 41 % (ref 37.5–51.0)
Hemoglobin: 13.5 g/dL (ref 13.0–17.7)
MCH: 31.1 pg (ref 26.6–33.0)
MCHC: 32.9 g/dL (ref 31.5–35.7)
MCV: 95 fL (ref 79–97)
Platelets: 206 10*3/uL (ref 150–450)
RBC: 4.34 x10E6/uL (ref 4.14–5.80)
RDW: 12.3 % (ref 11.6–15.4)
WBC: 6.1 10*3/uL (ref 3.4–10.8)

## 2022-03-06 LAB — COMPREHENSIVE METABOLIC PANEL
ALT: 22 IU/L (ref 0–44)
AST: 20 IU/L (ref 0–40)
Albumin/Globulin Ratio: 1.3 (ref 1.2–2.2)
Albumin: 3.8 g/dL (ref 3.7–4.7)
Alkaline Phosphatase: 88 IU/L (ref 44–121)
BUN/Creatinine Ratio: 17 (ref 10–24)
BUN: 20 mg/dL (ref 8–27)
Bilirubin Total: 0.5 mg/dL (ref 0.0–1.2)
CO2: 26 mmol/L (ref 20–29)
Calcium: 9.7 mg/dL (ref 8.6–10.2)
Chloride: 101 mmol/L (ref 96–106)
Creatinine, Ser: 1.19 mg/dL (ref 0.76–1.27)
Globulin, Total: 2.9 g/dL (ref 1.5–4.5)
Glucose: 107 mg/dL — ABNORMAL HIGH (ref 70–99)
Potassium: 4.4 mmol/L (ref 3.5–5.2)
Sodium: 141 mmol/L (ref 134–144)
Total Protein: 6.7 g/dL (ref 6.0–8.5)
eGFR: 61 mL/min/{1.73_m2} (ref >59)

## 2022-03-06 LAB — LIPID PANEL
Chol/HDL Ratio: 6.1 ratio — ABNORMAL HIGH (ref 0.0–5.0)
Cholesterol, Total: 230 mg/dL — ABNORMAL HIGH (ref 100–199)
HDL: 38 mg/dL — ABNORMAL LOW (ref >39)
LDL Chol Calc (NIH): 150 mg/dL — ABNORMAL HIGH (ref 0–99)
Triglycerides: 230 mg/dL — ABNORMAL HIGH (ref 0–149)
VLDL Cholesterol Cal: 42 mg/dL — ABNORMAL HIGH (ref 5–40)

## 2022-03-06 LAB — HEMOGLOBIN A1C
Est. average glucose Bld gHb Est-mCnc: 108 mg/dL
Hgb A1c MFr Bld: 5.4 % (ref 4.8–5.6)

## 2022-03-06 LAB — TSH: TSH: 2.73 u[IU]/mL (ref 0.450–4.500)

## 2022-04-15 ENCOUNTER — Other Ambulatory Visit: Payer: Self-pay

## 2022-04-15 MED ORDER — ISOSORBIDE MONONITRATE ER 30 MG PO TB24: 30.0000 mg | ORAL_TABLET | Freq: Two times a day (BID) | ORAL | 3 refills | Status: AC

## 2022-04-29 DIAGNOSIS — M48062 Spinal stenosis, lumbar region with neurogenic claudication: Secondary | ICD-10-CM | POA: Diagnosis not present

## 2022-05-07 DIAGNOSIS — I519 Heart disease, unspecified: Secondary | ICD-10-CM | POA: Diagnosis not present

## 2022-05-07 DIAGNOSIS — M48062 Spinal stenosis, lumbar region with neurogenic claudication: Secondary | ICD-10-CM | POA: Diagnosis not present

## 2022-05-07 DIAGNOSIS — K219 Gastro-esophageal reflux disease without esophagitis: Secondary | ICD-10-CM | POA: Diagnosis not present

## 2022-05-07 DIAGNOSIS — E119 Type 2 diabetes mellitus without complications: Secondary | ICD-10-CM | POA: Diagnosis not present

## 2022-05-07 DIAGNOSIS — J449 Chronic obstructive pulmonary disease, unspecified: Secondary | ICD-10-CM | POA: Diagnosis not present

## 2022-05-09 DIAGNOSIS — I519 Heart disease, unspecified: Secondary | ICD-10-CM | POA: Diagnosis not present

## 2022-05-09 DIAGNOSIS — M48062 Spinal stenosis, lumbar region with neurogenic claudication: Secondary | ICD-10-CM | POA: Diagnosis not present

## 2022-05-09 DIAGNOSIS — J449 Chronic obstructive pulmonary disease, unspecified: Secondary | ICD-10-CM | POA: Diagnosis not present

## 2022-05-09 DIAGNOSIS — K219 Gastro-esophageal reflux disease without esophagitis: Secondary | ICD-10-CM | POA: Diagnosis not present

## 2022-05-09 DIAGNOSIS — E119 Type 2 diabetes mellitus without complications: Secondary | ICD-10-CM | POA: Diagnosis not present

## 2022-05-14 DIAGNOSIS — E119 Type 2 diabetes mellitus without complications: Secondary | ICD-10-CM | POA: Diagnosis not present

## 2022-05-14 DIAGNOSIS — M48062 Spinal stenosis, lumbar region with neurogenic claudication: Secondary | ICD-10-CM | POA: Diagnosis not present

## 2022-05-14 DIAGNOSIS — J449 Chronic obstructive pulmonary disease, unspecified: Secondary | ICD-10-CM | POA: Diagnosis not present

## 2022-05-14 DIAGNOSIS — I519 Heart disease, unspecified: Secondary | ICD-10-CM | POA: Diagnosis not present

## 2022-05-14 DIAGNOSIS — K219 Gastro-esophageal reflux disease without esophagitis: Secondary | ICD-10-CM | POA: Diagnosis not present

## 2022-05-16 DIAGNOSIS — M48062 Spinal stenosis, lumbar region with neurogenic claudication: Secondary | ICD-10-CM | POA: Diagnosis not present

## 2022-05-16 DIAGNOSIS — K219 Gastro-esophageal reflux disease without esophagitis: Secondary | ICD-10-CM | POA: Diagnosis not present

## 2022-05-16 DIAGNOSIS — I519 Heart disease, unspecified: Secondary | ICD-10-CM | POA: Diagnosis not present

## 2022-05-16 DIAGNOSIS — E119 Type 2 diabetes mellitus without complications: Secondary | ICD-10-CM | POA: Diagnosis not present

## 2022-05-16 DIAGNOSIS — J449 Chronic obstructive pulmonary disease, unspecified: Secondary | ICD-10-CM | POA: Diagnosis not present

## 2022-05-17 DIAGNOSIS — M47816 Spondylosis without myelopathy or radiculopathy, lumbar region: Secondary | ICD-10-CM | POA: Diagnosis not present

## 2022-05-17 DIAGNOSIS — G8929 Other chronic pain: Secondary | ICD-10-CM | POA: Diagnosis not present

## 2022-05-17 DIAGNOSIS — J449 Chronic obstructive pulmonary disease, unspecified: Secondary | ICD-10-CM | POA: Diagnosis not present

## 2022-05-17 DIAGNOSIS — M47812 Spondylosis without myelopathy or radiculopathy, cervical region: Secondary | ICD-10-CM | POA: Diagnosis not present

## 2022-05-21 DIAGNOSIS — J449 Chronic obstructive pulmonary disease, unspecified: Secondary | ICD-10-CM | POA: Diagnosis not present

## 2022-05-21 DIAGNOSIS — I519 Heart disease, unspecified: Secondary | ICD-10-CM | POA: Diagnosis not present

## 2022-05-21 DIAGNOSIS — K219 Gastro-esophageal reflux disease without esophagitis: Secondary | ICD-10-CM | POA: Diagnosis not present

## 2022-05-21 DIAGNOSIS — E119 Type 2 diabetes mellitus without complications: Secondary | ICD-10-CM | POA: Diagnosis not present

## 2022-05-21 DIAGNOSIS — M48062 Spinal stenosis, lumbar region with neurogenic claudication: Secondary | ICD-10-CM | POA: Diagnosis not present

## 2022-05-23 DIAGNOSIS — I519 Heart disease, unspecified: Secondary | ICD-10-CM | POA: Diagnosis not present

## 2022-05-23 DIAGNOSIS — M48062 Spinal stenosis, lumbar region with neurogenic claudication: Secondary | ICD-10-CM | POA: Diagnosis not present

## 2022-05-23 DIAGNOSIS — J449 Chronic obstructive pulmonary disease, unspecified: Secondary | ICD-10-CM | POA: Diagnosis not present

## 2022-05-23 DIAGNOSIS — K219 Gastro-esophageal reflux disease without esophagitis: Secondary | ICD-10-CM | POA: Diagnosis not present

## 2022-05-23 DIAGNOSIS — E119 Type 2 diabetes mellitus without complications: Secondary | ICD-10-CM | POA: Diagnosis not present

## 2022-05-29 DIAGNOSIS — I519 Heart disease, unspecified: Secondary | ICD-10-CM | POA: Diagnosis not present

## 2022-05-29 DIAGNOSIS — K219 Gastro-esophageal reflux disease without esophagitis: Secondary | ICD-10-CM | POA: Diagnosis not present

## 2022-05-29 DIAGNOSIS — M48062 Spinal stenosis, lumbar region with neurogenic claudication: Secondary | ICD-10-CM | POA: Diagnosis not present

## 2022-05-29 DIAGNOSIS — E119 Type 2 diabetes mellitus without complications: Secondary | ICD-10-CM | POA: Diagnosis not present

## 2022-05-29 DIAGNOSIS — J449 Chronic obstructive pulmonary disease, unspecified: Secondary | ICD-10-CM | POA: Diagnosis not present

## 2022-06-03 ENCOUNTER — Ambulatory Visit: Payer: Medicare Other | Attending: Physician Assistant

## 2022-06-06 DIAGNOSIS — J449 Chronic obstructive pulmonary disease, unspecified: Secondary | ICD-10-CM | POA: Diagnosis not present

## 2022-06-06 DIAGNOSIS — K219 Gastro-esophageal reflux disease without esophagitis: Secondary | ICD-10-CM | POA: Diagnosis not present

## 2022-06-06 DIAGNOSIS — M48062 Spinal stenosis, lumbar region with neurogenic claudication: Secondary | ICD-10-CM | POA: Diagnosis not present

## 2022-06-06 DIAGNOSIS — I519 Heart disease, unspecified: Secondary | ICD-10-CM | POA: Diagnosis not present

## 2022-06-06 DIAGNOSIS — E119 Type 2 diabetes mellitus without complications: Secondary | ICD-10-CM | POA: Diagnosis not present

## 2022-06-12 DIAGNOSIS — J449 Chronic obstructive pulmonary disease, unspecified: Secondary | ICD-10-CM | POA: Diagnosis not present

## 2022-06-12 DIAGNOSIS — I519 Heart disease, unspecified: Secondary | ICD-10-CM | POA: Diagnosis not present

## 2022-06-12 DIAGNOSIS — K219 Gastro-esophageal reflux disease without esophagitis: Secondary | ICD-10-CM | POA: Diagnosis not present

## 2022-06-12 DIAGNOSIS — M48062 Spinal stenosis, lumbar region with neurogenic claudication: Secondary | ICD-10-CM | POA: Diagnosis not present

## 2022-06-12 DIAGNOSIS — E119 Type 2 diabetes mellitus without complications: Secondary | ICD-10-CM | POA: Diagnosis not present

## 2022-06-12 IMAGING — CR DG CHEST 2V
2 series · 2 of 2 positions shown · non-contrast
Comparison: March 08, 2020

CLINICAL DATA: Chest pain with nausea and vomiting

EXAM:
CHEST - 2 VIEW

[chest lat]
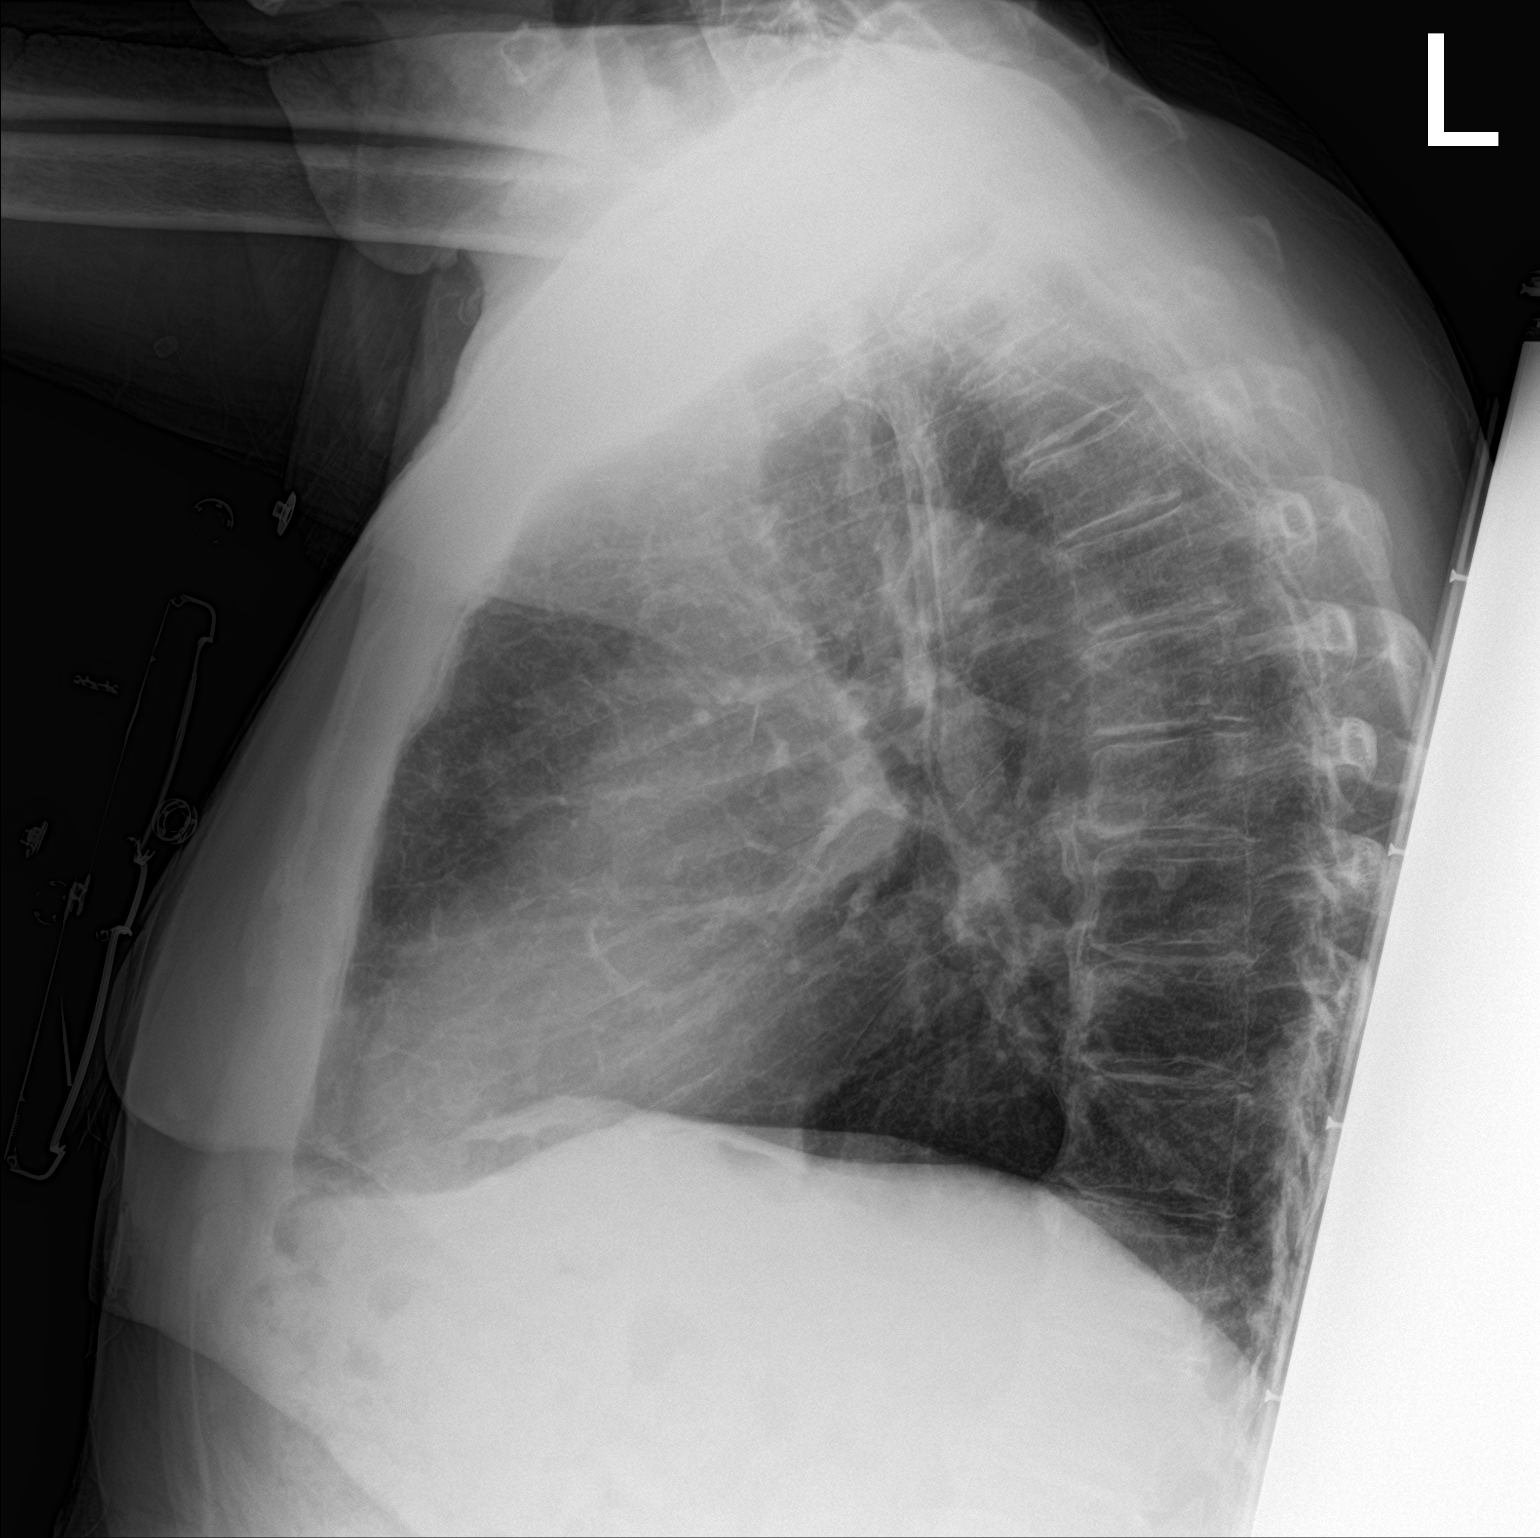

[chest ap]
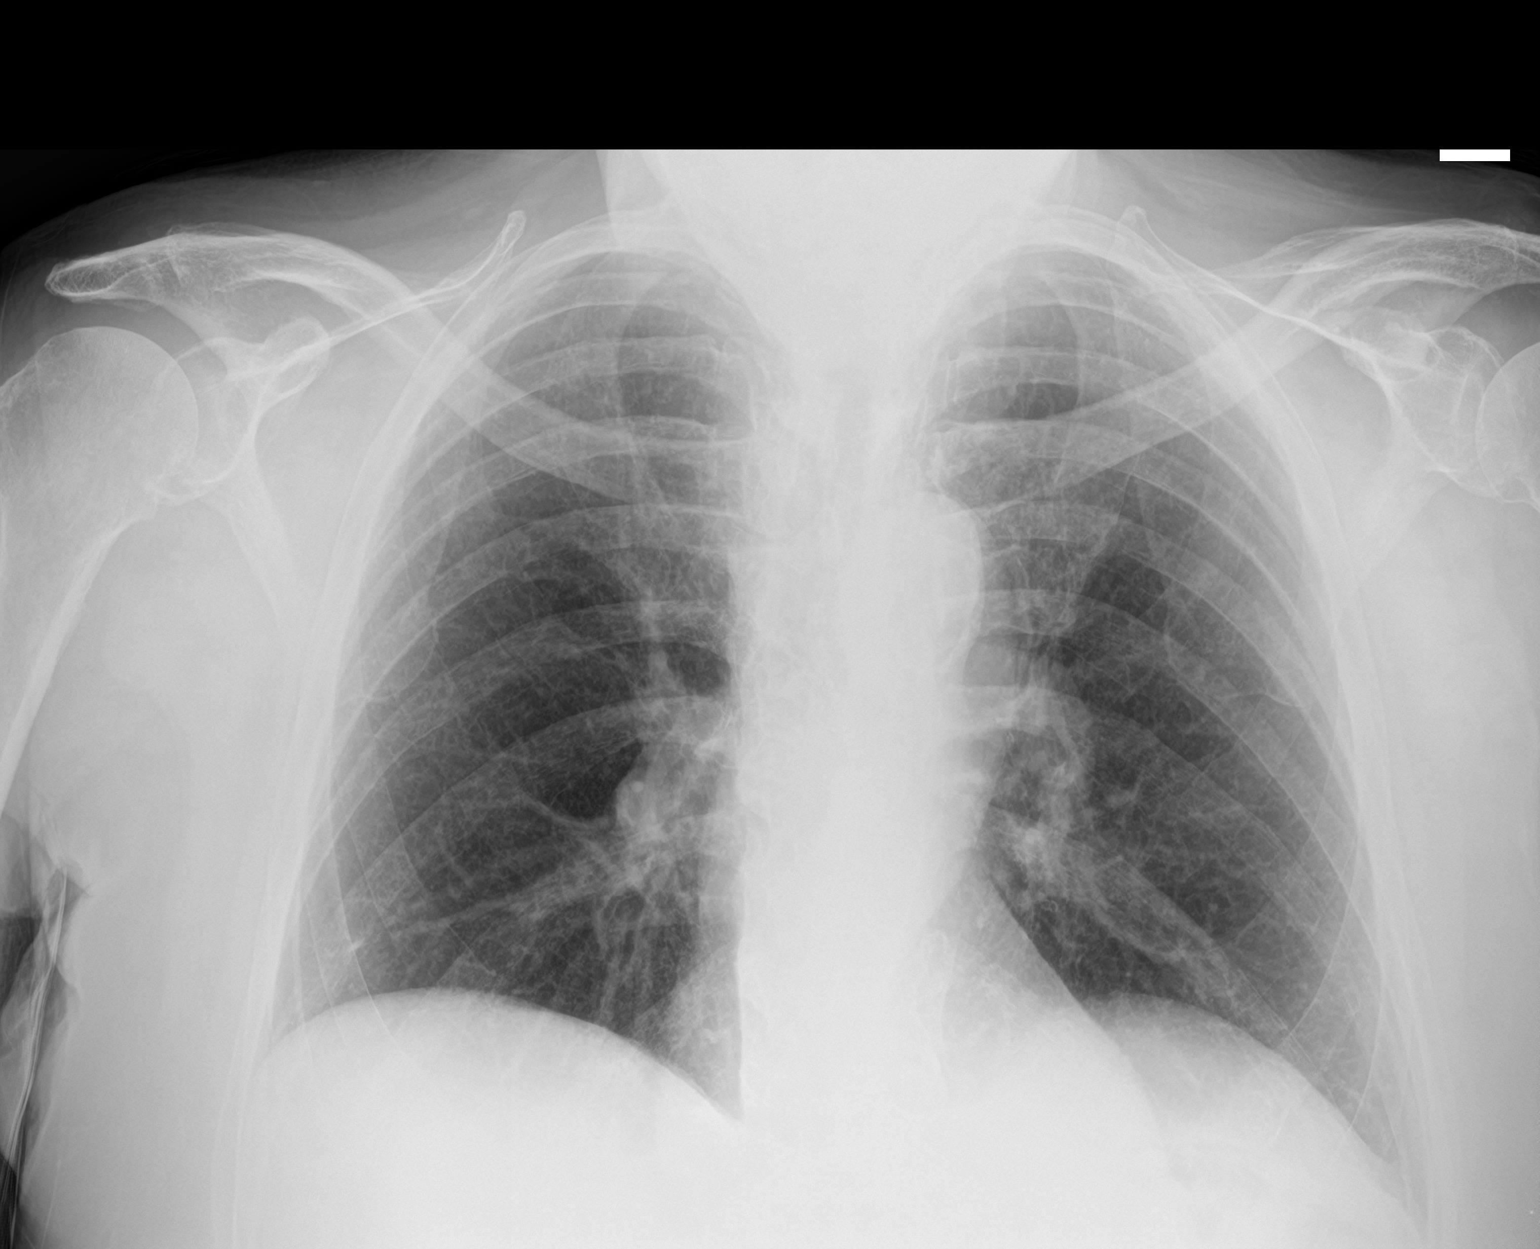

[2 of 2 positions shown; findings below may reference images not displayed]

FINDINGS: Lungs are clear. Heart size and pulmonary vascularity are normal. No
adenopathy. There is aortic atherosclerosis. There is degenerative
change in the thoracic spine. No pneumothorax.
IMPRESSION: Lungs clear. Heart size normal. Aortic Atherosclerosis
(P2H0A-Y15.5).

## 2022-06-12 IMAGING — CT CT ANGIO CHEST-ABD-PELV FOR DISSECTION W/ AND WO/W CM
2 of 7 series · 14 of 46 positions shown, 16 images · IV contrast (APPLIED)
Comparison: 11/19/2019

CLINICAL DATA: Abdominal pain, aortic dissection suspected

EXAM:
CT ANGIOGRAPHY CHEST, ABDOMEN AND PELVIS
TECHNIQUE: Non-contrast CT of the chest was initially obtained.

[Series 6: arterial · axial · arterial · 0.89mm/px · z∈[-520,+70]mm · 11 of 333 slices shown, 13 images]
[im 19/333  soft-tissue]
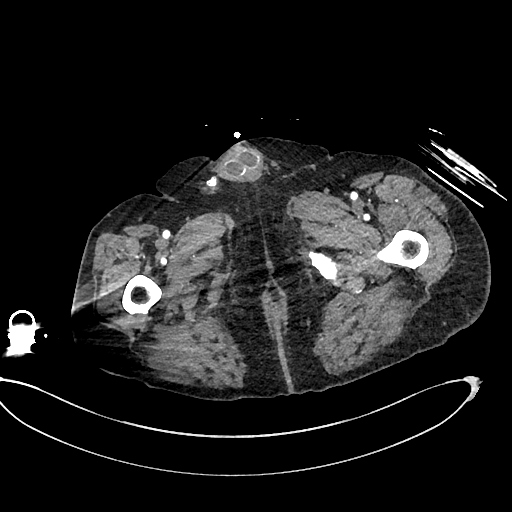
[im 19/333  bone]
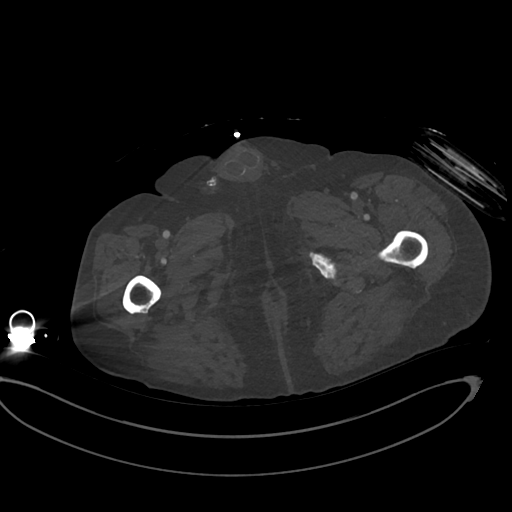
[im 56/333  soft-tissue]
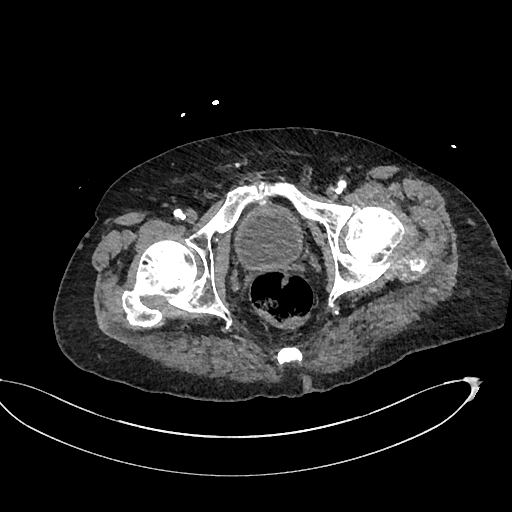
[im 74/333  soft-tissue]
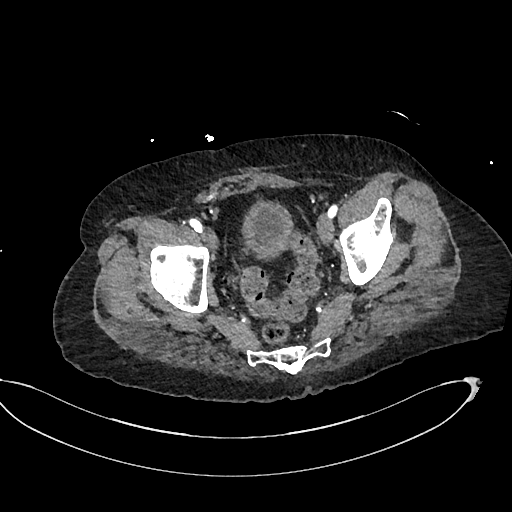
[im 111/333  soft-tissue]
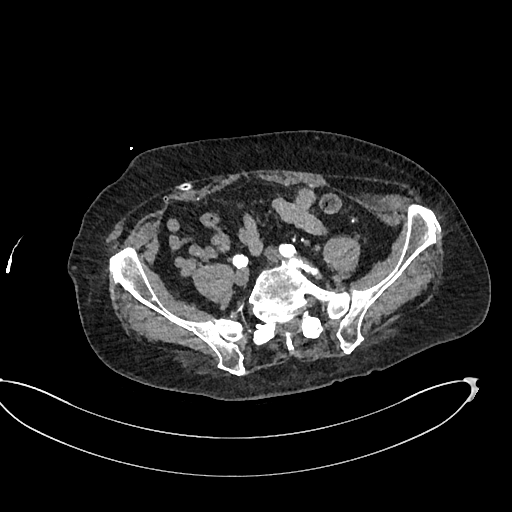
[im 130/333  soft-tissue]
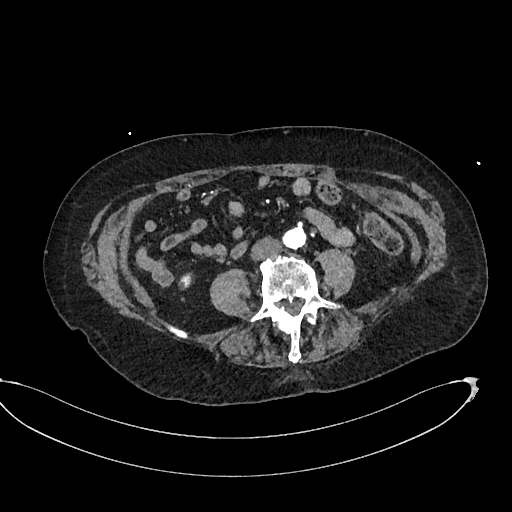
[im 167/333  soft-tissue]
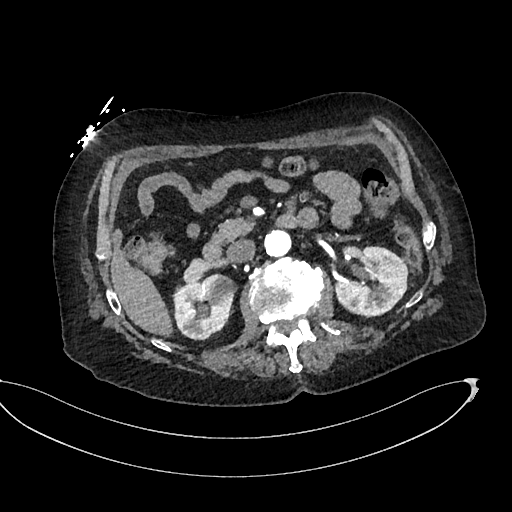
[im 203/333  soft-tissue]
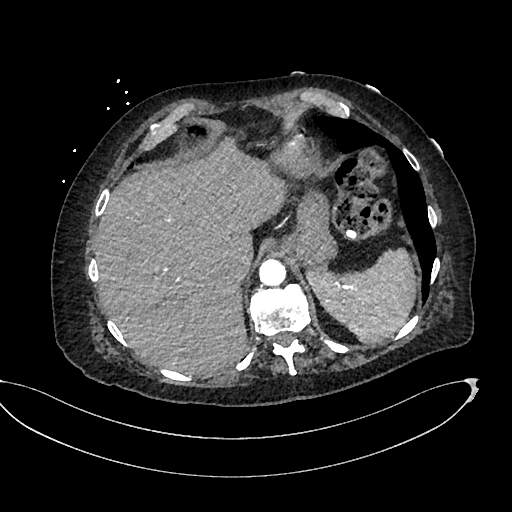
[im 222/333  soft-tissue]
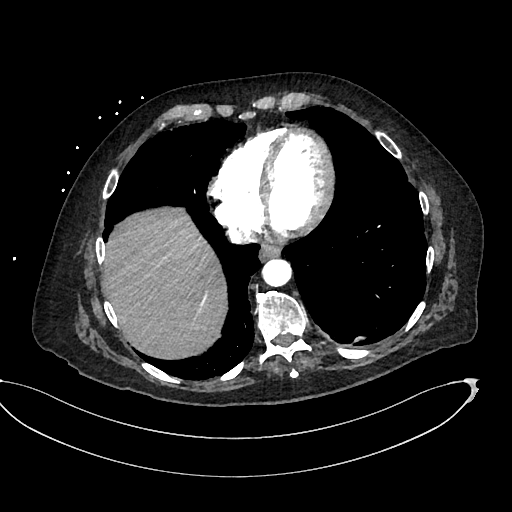
[im 259/333  soft-tissue]
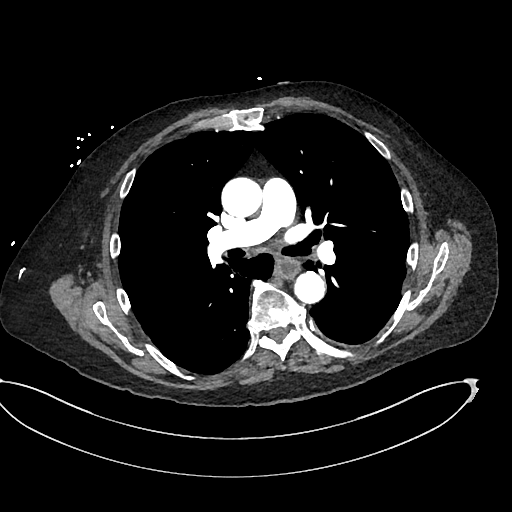
[im 259/333  bone]
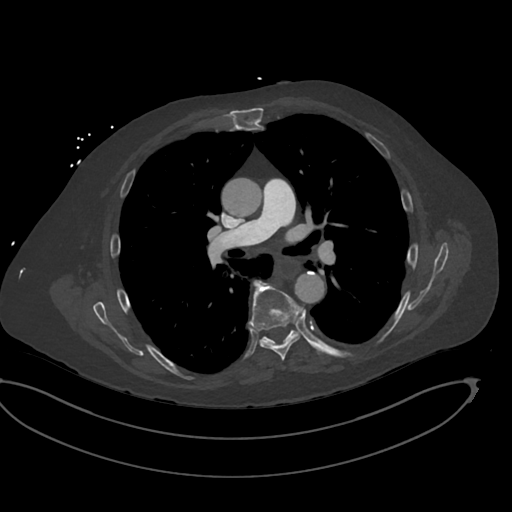
[im 277/333  soft-tissue]
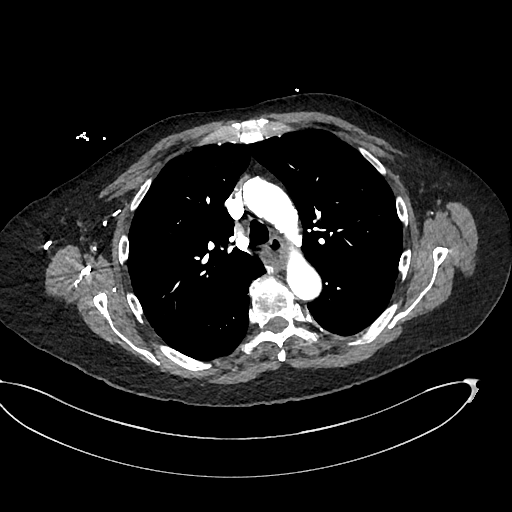
[im 314/333  soft-tissue]
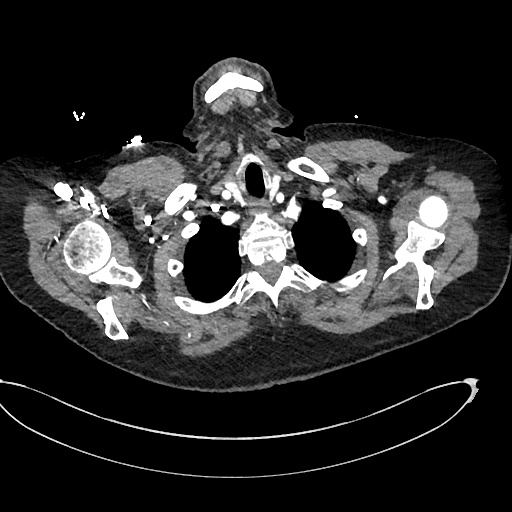

[Series 9: cor · coronal · 0.83mm/px · 3 of 159 slices shown]
[im 40/159  soft-tissue]
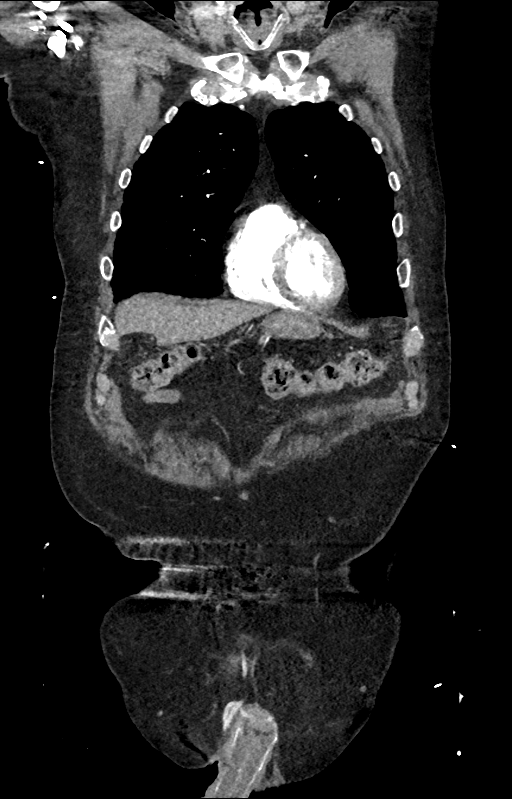
[im 80/159  soft-tissue]
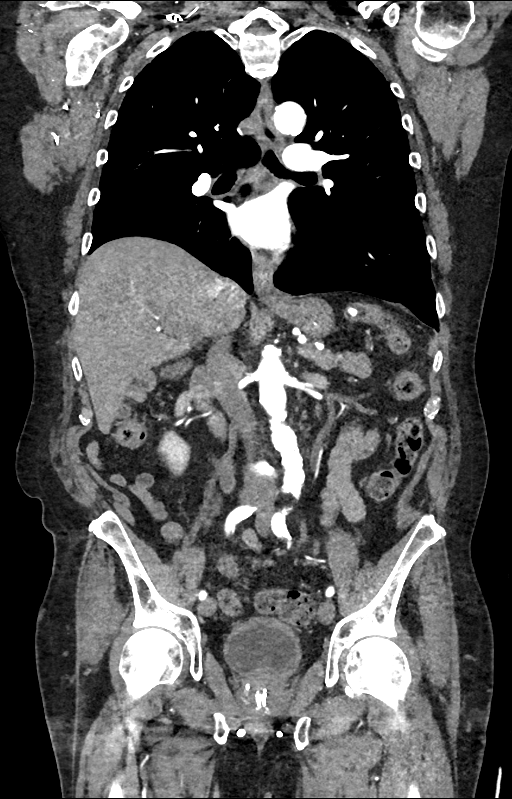
[im 119/159  soft-tissue]
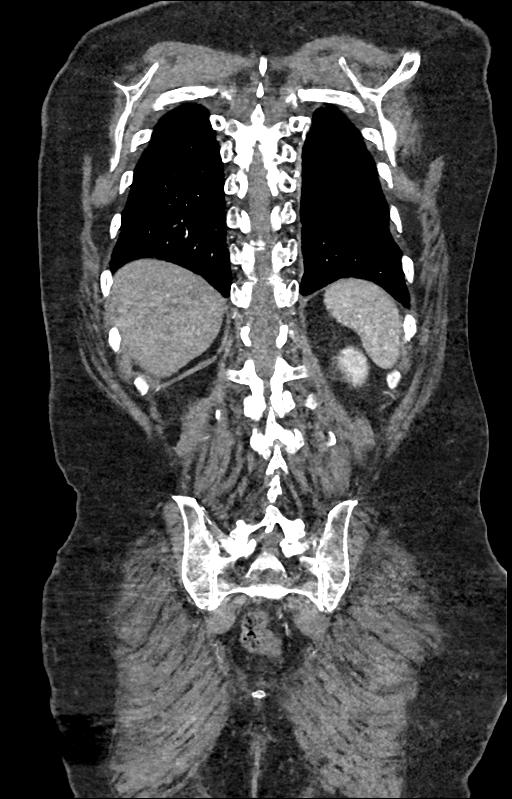

[14 of 46 positions shown; findings below may reference images not displayed]

Multidetector CT imaging through the chest, abdomen and pelvis was
performed using the standard protocol during bolus administration of
intravenous contrast. Multiplanar reconstructed images and MIPs were
obtained and reviewed to evaluate the vascular anatomy.

CONTRAST:  75mL OMNIPAQUE IOHEXOL 350 MG/ML SOLN
FINDINGS: CTA CHEST FINDINGS

Cardiovascular: Preferential opacification of the thoracic aorta.
Normal contour and caliber of the thoracic aorta. No evidence of
aneurysm, dissection, or other acute aortic pathology. Mild mixed
calcific atherosclerosis. Normal heart size. Three-vessel coronary
artery calcifications. No pericardial effusion.

Mediastinum/Nodes: No enlarged mediastinal, hilar, or axillary lymph
nodes. The mid and lower esophagus is thickened and fluid-filled.
Thyroid gland and trachea demonstrate no significant findings.

Lungs/Pleura: Mild centrilobular and paraseptal emphysema.
Background of very fine centrilobular nodularity. Diffuse bilateral
bronchial wall thickening. No pleural effusion or pneumothorax.

Musculoskeletal: No chest wall abnormality. No acute or significant
osseous findings.

Review of the MIP images confirms the above findings.

CTA ABDOMEN AND PELVIS FINDINGS

VASCULAR

Normal contour and caliber of the abdominal aorta. No evidence of
aneurysm, dissection, or other acute aortic pathology. Standard
branching pattern of the abdominal aorta, with solitary bilateral
renal arteries. Moderate mixed calcific atherosclerosis.

Review of the MIP images confirms the above findings.

NON-VASCULAR

Hepatobiliary: No solid liver abnormality is seen. No gallstones,
gallbladder wall thickening, or biliary dilatation.

Pancreas: Unremarkable. No pancreatic ductal dilatation or
surrounding inflammatory changes.

Spleen: Normal in size without significant abnormality.

Adrenals/Urinary Tract: Adrenal glands are unremarkable. Small
nonobstructive calculus in the midportion of the left kidney. Simple
bilateral renal cysts. No hydronephrosis. Thickening of the urinary
bladder, likely secondary to chronic outlet obstruction.

Stomach/Bowel: Stomach is within normal limits. Appendix appears
normal. No evidence of bowel wall thickening, distention, or
inflammatory changes.

Lymphatic: No enlarged abdominal or pelvic lymph nodes.

Reproductive: Penile implant and right hemi abdominal reservoir.

Other: No abdominal wall hernia or abnormality. No abdominopelvic
ascites.

Musculoskeletal: No acute or significant osseous findings.

Review of the MIP images confirms the above findings.
IMPRESSION: 1. Normal contour and caliber of the thoracic and abdominal aorta.
No evidence of aneurysm, dissection, or other acute aortic
pathology. Moderate mixed calcific atherosclerosis.
2. The mid and lower esophagus is thickened and fluid-filled,
suggestive of reflux esophagitis. Malignancy is difficult to
strictly exclude.
3. Mild emphysema.
4. Background of very fine centrilobular nodularity, consistent with
smoking-related respiratory bronchiolitis.
5. Diffuse bilateral bronchial wall thickening, consistent with
nonspecific infectious or inflammatory bronchitis.
6. Coronary artery disease.
7. Small nonobstructive left renal calculus.

Aortic Atherosclerosis (2EUCA-IJ0.0) and Emphysema (2EUCA-K2K.I).

## 2022-06-20 DIAGNOSIS — J449 Chronic obstructive pulmonary disease, unspecified: Secondary | ICD-10-CM | POA: Diagnosis not present

## 2022-06-20 DIAGNOSIS — M48062 Spinal stenosis, lumbar region with neurogenic claudication: Secondary | ICD-10-CM | POA: Diagnosis not present

## 2022-06-20 DIAGNOSIS — I519 Heart disease, unspecified: Secondary | ICD-10-CM | POA: Diagnosis not present

## 2022-06-20 DIAGNOSIS — E119 Type 2 diabetes mellitus without complications: Secondary | ICD-10-CM | POA: Diagnosis not present

## 2022-06-20 DIAGNOSIS — K219 Gastro-esophageal reflux disease without esophagitis: Secondary | ICD-10-CM | POA: Diagnosis not present

## 2022-06-25 DIAGNOSIS — K219 Gastro-esophageal reflux disease without esophagitis: Secondary | ICD-10-CM | POA: Diagnosis not present

## 2022-06-25 DIAGNOSIS — E119 Type 2 diabetes mellitus without complications: Secondary | ICD-10-CM | POA: Diagnosis not present

## 2022-06-25 DIAGNOSIS — M48062 Spinal stenosis, lumbar region with neurogenic claudication: Secondary | ICD-10-CM | POA: Diagnosis not present

## 2022-06-25 DIAGNOSIS — I519 Heart disease, unspecified: Secondary | ICD-10-CM | POA: Diagnosis not present

## 2022-06-25 DIAGNOSIS — J449 Chronic obstructive pulmonary disease, unspecified: Secondary | ICD-10-CM | POA: Diagnosis not present

## 2022-06-26 DIAGNOSIS — M48062 Spinal stenosis, lumbar region with neurogenic claudication: Secondary | ICD-10-CM | POA: Diagnosis not present

## 2022-07-02 DIAGNOSIS — J449 Chronic obstructive pulmonary disease, unspecified: Secondary | ICD-10-CM | POA: Diagnosis not present

## 2022-07-02 DIAGNOSIS — I519 Heart disease, unspecified: Secondary | ICD-10-CM | POA: Diagnosis not present

## 2022-07-02 DIAGNOSIS — M48062 Spinal stenosis, lumbar region with neurogenic claudication: Secondary | ICD-10-CM | POA: Diagnosis not present

## 2022-07-02 DIAGNOSIS — K219 Gastro-esophageal reflux disease without esophagitis: Secondary | ICD-10-CM | POA: Diagnosis not present

## 2022-07-02 DIAGNOSIS — E119 Type 2 diabetes mellitus without complications: Secondary | ICD-10-CM | POA: Diagnosis not present

## 2022-09-09 ENCOUNTER — Other Ambulatory Visit: Payer: Self-pay

## 2022-09-09 ENCOUNTER — Emergency Department (HOSPITAL_COMMUNITY): Payer: Medicare Other

## 2022-09-09 ENCOUNTER — Observation Stay (HOSPITAL_COMMUNITY)
Admission: EM | Admit: 2022-09-09 | Discharge: 2022-09-10 | Disposition: A | Discharge status: Home / Self Care | Payer: Medicare Other | Attending: General Surgery | Admitting: General Surgery

## 2022-09-09 DIAGNOSIS — S3993XA Unspecified injury of pelvis, initial encounter: Secondary | ICD-10-CM | POA: Diagnosis not present

## 2022-09-09 DIAGNOSIS — Z87891 Personal history of nicotine dependence: Secondary | ICD-10-CM | POA: Diagnosis not present

## 2022-09-09 DIAGNOSIS — Z96652 Presence of left artificial knee joint: Secondary | ICD-10-CM | POA: Insufficient documentation

## 2022-09-09 DIAGNOSIS — Z79899 Other long term (current) drug therapy: Secondary | ICD-10-CM | POA: Diagnosis not present

## 2022-09-09 DIAGNOSIS — J449 Chronic obstructive pulmonary disease, unspecified: Secondary | ICD-10-CM | POA: Insufficient documentation

## 2022-09-09 DIAGNOSIS — I700 Atherosclerosis of aorta: Secondary | ICD-10-CM | POA: Diagnosis not present

## 2022-09-09 DIAGNOSIS — M19011 Primary osteoarthritis, right shoulder: Secondary | ICD-10-CM | POA: Diagnosis not present

## 2022-09-09 DIAGNOSIS — S0990XA Unspecified injury of head, initial encounter: Secondary | ICD-10-CM | POA: Diagnosis not present

## 2022-09-09 DIAGNOSIS — I2510 Atherosclerotic heart disease of native coronary artery without angina pectoris: Secondary | ICD-10-CM | POA: Diagnosis not present

## 2022-09-09 DIAGNOSIS — R0989 Other specified symptoms and signs involving the circulatory and respiratory systems: Secondary | ICD-10-CM | POA: Diagnosis not present

## 2022-09-09 DIAGNOSIS — Z043 Encounter for examination and observation following other accident: Secondary | ICD-10-CM | POA: Diagnosis not present

## 2022-09-09 DIAGNOSIS — I5022 Chronic systolic (congestive) heart failure: Secondary | ICD-10-CM | POA: Diagnosis not present

## 2022-09-09 DIAGNOSIS — Q6102 Congenital multiple renal cysts: Secondary | ICD-10-CM | POA: Diagnosis not present

## 2022-09-09 DIAGNOSIS — J9811 Atelectasis: Secondary | ICD-10-CM | POA: Diagnosis not present

## 2022-09-09 DIAGNOSIS — S3991XA Unspecified injury of abdomen, initial encounter: Secondary | ICD-10-CM | POA: Diagnosis not present

## 2022-09-09 DIAGNOSIS — I10 Essential (primary) hypertension: Secondary | ICD-10-CM | POA: Diagnosis not present

## 2022-09-09 DIAGNOSIS — M160 Bilateral primary osteoarthritis of hip: Secondary | ICD-10-CM | POA: Diagnosis not present

## 2022-09-09 DIAGNOSIS — S299XXA Unspecified injury of thorax, initial encounter: Secondary | ICD-10-CM | POA: Diagnosis not present

## 2022-09-09 DIAGNOSIS — S2241XA Multiple fractures of ribs, right side, initial encounter for closed fracture: Secondary | ICD-10-CM | POA: Diagnosis not present

## 2022-09-09 DIAGNOSIS — R9389 Abnormal findings on diagnostic imaging of other specified body structures: Secondary | ICD-10-CM | POA: Diagnosis not present

## 2022-09-09 DIAGNOSIS — M47812 Spondylosis without myelopathy or radiculopathy, cervical region: Secondary | ICD-10-CM | POA: Diagnosis not present

## 2022-09-09 DIAGNOSIS — Z7982 Long term (current) use of aspirin: Secondary | ICD-10-CM | POA: Diagnosis not present

## 2022-09-09 DIAGNOSIS — M50323 Other cervical disc degeneration at C6-C7 level: Secondary | ICD-10-CM | POA: Diagnosis not present

## 2022-09-09 DIAGNOSIS — W19XXXA Unspecified fall, initial encounter: Secondary | ICD-10-CM | POA: Diagnosis not present

## 2022-09-09 DIAGNOSIS — S2249XA Multiple fractures of ribs, unspecified side, initial encounter for closed fracture: Secondary | ICD-10-CM | POA: Diagnosis present

## 2022-09-09 LAB — CBC WITH DIFFERENTIAL/PLATELET
Abs Immature Granulocytes: 0.04 10*3/uL (ref 0.00–0.07)
Basophils Absolute: 0 10*3/uL (ref 0.0–0.1)
Basophils Relative: 0 %
Eosinophils Absolute: 0.2 10*3/uL (ref 0.0–0.5)
Eosinophils Relative: 2 %
HCT: 39.7 % (ref 39.0–52.0)
Hemoglobin: 12.8 g/dL — ABNORMAL LOW (ref 13.0–17.0)
Immature Granulocytes: 1 %
Lymphocytes Relative: 12 %
Lymphs Abs: 1 10*3/uL (ref 0.7–4.0)
MCH: 33 pg (ref 26.0–34.0)
MCHC: 32.2 g/dL (ref 30.0–36.0)
MCV: 102.3 fL — ABNORMAL HIGH (ref 80.0–100.0)
Monocytes Absolute: 1 10*3/uL (ref 0.1–1.0)
Monocytes Relative: 12 %
Neutro Abs: 6.5 10*3/uL (ref 1.7–7.7)
Neutrophils Relative %: 73 %
Platelets: 172 10*3/uL (ref 150–400)
RBC: 3.88 MIL/uL — ABNORMAL LOW (ref 4.22–5.81)
RDW: 13 % (ref 11.5–15.5)
WBC: 8.8 10*3/uL (ref 4.0–10.5)
nRBC: 0 % (ref 0.0–0.2)

## 2022-09-09 LAB — COMPREHENSIVE METABOLIC PANEL
ALT: 20 U/L (ref 0–44)
AST: 22 U/L (ref 15–41)
Albumin: 3.2 g/dL — ABNORMAL LOW (ref 3.5–5.0)
Alkaline Phosphatase: 75 U/L (ref 38–126)
Anion gap: 14 (ref 5–15)
BUN: 19 mg/dL (ref 8–23)
CO2: 26 mmol/L (ref 22–32)
Calcium: 9.6 mg/dL (ref 8.9–10.3)
Chloride: 99 mmol/L (ref 98–111)
Creatinine, Ser: 1.11 mg/dL (ref 0.61–1.24)
GFR, Estimated: >60 mL/min (ref >60)
Glucose, Bld: 135 mg/dL — ABNORMAL HIGH (ref 70–99)
Potassium: 4.5 mmol/L (ref 3.5–5.1)
Sodium: 139 mmol/L (ref 135–145)
Total Bilirubin: 0.8 mg/dL (ref 0.3–1.2)
Total Protein: 7 g/dL (ref 6.5–8.1)

## 2022-09-09 LAB — URINALYSIS, ROUTINE W REFLEX MICROSCOPIC
Bacteria, UA: NONE SEEN
Bilirubin Urine: NEGATIVE
Glucose, UA: NEGATIVE mg/dL
Hgb urine dipstick: NEGATIVE
Ketones, ur: NEGATIVE mg/dL
Nitrite: NEGATIVE
Protein, ur: NEGATIVE mg/dL
Specific Gravity, Urine: 1.026 (ref 1.005–1.030)
pH: 6 (ref 5.0–8.0)

## 2022-09-09 MED ORDER — IOHEXOL 350 MG/ML SOLN
75.0000 mL | Freq: Once | INTRAVENOUS | Status: AC | PRN
  Administered 2022-09-09: 75 mL via INTRAVENOUS

## 2022-09-09 MED ORDER — LIDOCAINE 5 % EX PTCH
1.0000 | MEDICATED_PATCH | CUTANEOUS | Status: DC
  Administered 2022-09-09 – 2022-09-10 (×2): 1 via TRANSDERMAL
  Filled 2022-09-09 (×2): qty 1

## 2022-09-09 MED ORDER — POLYETHYLENE GLYCOL 3350 17 G PO PACK: 17.0000 g | PACK | Freq: Every day | ORAL | Status: DC | PRN

## 2022-09-09 MED ORDER — ACETAMINOPHEN 500 MG PO TABS: 1000.0000 mg | ORAL_TABLET | Freq: Four times a day (QID) | ORAL | Status: DC

## 2022-09-09 MED ORDER — METHOCARBAMOL 500 MG PO TABS: 1000.0000 mg | ORAL_TABLET | Freq: Three times a day (TID) | ORAL | Status: DC

## 2022-09-09 MED ORDER — VITAMIN B-12 1000 MCG PO TABS
1000.0000 ug | ORAL_TABLET | Freq: Every morning | ORAL | Status: DC
  Administered 2022-09-10: 1000 ug via ORAL
  Filled 2022-09-09: qty 1

## 2022-09-09 MED ORDER — UMECLIDINIUM-VILANTEROL 62.5-25 MCG/ACT IN AEPB: 1.0000 | INHALATION_SPRAY | Freq: Every day | RESPIRATORY_TRACT | Status: DC

## 2022-09-09 MED ORDER — MORPHINE SULFATE (PF) 2 MG/ML IV SOLN: 1.0000 mg | INTRAVENOUS | Status: DC | PRN

## 2022-09-09 MED ORDER — FENTANYL CITRATE PF 50 MCG/ML IJ SOSY
25.0000 ug | PREFILLED_SYRINGE | Freq: Once | INTRAMUSCULAR | Status: AC
  Administered 2022-09-09: 25 ug via INTRAVENOUS
  Filled 2022-09-09: qty 1

## 2022-09-09 MED ORDER — ALBUTEROL SULFATE (2.5 MG/3ML) 0.083% IN NEBU: 2.5000 mg | INHALATION_SOLUTION | Freq: Four times a day (QID) | RESPIRATORY_TRACT | Status: DC | PRN

## 2022-09-09 MED ORDER — DONEPEZIL HCL 10 MG PO TABS
10.0000 mg | ORAL_TABLET | Freq: Every day | ORAL | Status: DC
  Administered 2022-09-09: 10 mg via ORAL
  Filled 2022-09-09: qty 1

## 2022-09-09 MED ORDER — KETOROLAC TROMETHAMINE 15 MG/ML IJ SOLN
15.0000 mg | Freq: Once | INTRAMUSCULAR | Status: AC
  Administered 2022-09-09: 15 mg via INTRAVENOUS
  Filled 2022-09-09: qty 1

## 2022-09-09 MED ORDER — HYDRALAZINE HCL 20 MG/ML IJ SOLN: 10.0000 mg | INTRAMUSCULAR | Status: DC | PRN

## 2022-09-09 MED ORDER — HYDROMORPHONE HCL 1 MG/ML IJ SOLN
0.2500 mg | Freq: Once | INTRAMUSCULAR | Status: AC
  Administered 2022-09-09: 0.25 mg via INTRAVENOUS
  Filled 2022-09-09: qty 1

## 2022-09-09 MED ORDER — METHOCARBAMOL 500 MG PO TABS
500.0000 mg | ORAL_TABLET | Freq: Three times a day (TID) | ORAL | Status: DC
  Administered 2022-09-09 – 2022-09-10 (×4): 500 mg via ORAL
  Filled 2022-09-09 (×4): qty 1

## 2022-09-09 MED ORDER — ONDANSETRON 4 MG PO TBDP: 4.0000 mg | ORAL_TABLET | Freq: Four times a day (QID) | ORAL | Status: DC | PRN

## 2022-09-09 MED ORDER — OXYCODONE HCL 5 MG PO TABS: 2.5000 mg | ORAL_TABLET | ORAL | Status: DC | PRN

## 2022-09-09 MED ORDER — ENOXAPARIN SODIUM 30 MG/0.3ML IJ SOSY
30.0000 mg | PREFILLED_SYRINGE | Freq: Two times a day (BID) | INTRAMUSCULAR | Status: DC
  Administered 2022-09-10: 30 mg via SUBCUTANEOUS
  Filled 2022-09-09: qty 0.3

## 2022-09-09 MED ORDER — METOPROLOL TARTRATE 5 MG/5ML IV SOLN: 5.0000 mg | Freq: Four times a day (QID) | INTRAVENOUS | Status: DC | PRN

## 2022-09-09 MED ORDER — DOCUSATE SODIUM 100 MG PO CAPS
100.0000 mg | ORAL_CAPSULE | Freq: Two times a day (BID) | ORAL | Status: DC
  Administered 2022-09-09 – 2022-09-10 (×3): 100 mg via ORAL
  Filled 2022-09-09 (×3): qty 1

## 2022-09-09 MED ORDER — POLYETHYLENE GLYCOL 3350 17 GM/SCOOP PO POWD: 17.0000 g | Freq: Every day | ORAL | Status: DC | PRN

## 2022-09-09 MED ORDER — FINASTERIDE 5 MG PO TABS
5.0000 mg | ORAL_TABLET | Freq: Every day | ORAL | Status: DC
  Administered 2022-09-09: 5 mg via ORAL
  Filled 2022-09-09: qty 1

## 2022-09-09 MED ORDER — PANTOPRAZOLE SODIUM 40 MG PO TBEC
40.0000 mg | DELAYED_RELEASE_TABLET | Freq: Every day | ORAL | Status: DC
  Administered 2022-09-09 – 2022-09-10 (×2): 40 mg via ORAL
  Filled 2022-09-09 (×2): qty 1

## 2022-09-09 MED ORDER — NITROGLYCERIN 0.4 MG SL SUBL: 0.4000 mg | SUBLINGUAL_TABLET | SUBLINGUAL | Status: DC | PRN

## 2022-09-09 MED ORDER — ONDANSETRON HCL 4 MG/2ML IJ SOLN: 4.0000 mg | Freq: Four times a day (QID) | INTRAMUSCULAR | Status: DC | PRN

## 2022-09-09 MED ORDER — LIDOCAINE 5 % EX PTCH: 1.0000 | MEDICATED_PATCH | CUTANEOUS | Status: DC

## 2022-09-09 MED ORDER — ALBUTEROL SULFATE (2.5 MG/3ML) 0.083% IN NEBU
2.5000 mg | INHALATION_SOLUTION | Freq: Once | RESPIRATORY_TRACT | Status: AC
  Administered 2022-09-09: 2.5 mg via RESPIRATORY_TRACT
  Filled 2022-09-09: qty 3

## 2022-09-09 MED ORDER — VITAMIN D 25 MCG (1000 UNIT) PO TABS
2000.0000 [IU] | ORAL_TABLET | Freq: Every morning | ORAL | Status: DC
  Administered 2022-09-10: 2000 [IU] via ORAL
  Filled 2022-09-09: qty 2

## 2022-09-09 MED ORDER — RANOLAZINE ER 500 MG PO TB12
1000.0000 mg | ORAL_TABLET | Freq: Two times a day (BID) | ORAL | Status: DC
  Administered 2022-09-09 – 2022-09-10 (×2): 1000 mg via ORAL
  Filled 2022-09-09 (×3): qty 2

## 2022-09-09 MED ORDER — ISOSORBIDE MONONITRATE ER 30 MG PO TB24
30.0000 mg | ORAL_TABLET | Freq: Two times a day (BID) | ORAL | Status: DC
  Administered 2022-09-09 – 2022-09-10 (×2): 30 mg via ORAL
  Filled 2022-09-09 (×2): qty 1

## 2022-09-09 MED ORDER — ONDANSETRON HCL 4 MG/2ML IJ SOLN
4.0000 mg | Freq: Once | INTRAMUSCULAR | Status: AC
  Administered 2022-09-09: 4 mg via INTRAVENOUS
  Filled 2022-09-09: qty 2

## 2022-09-09 MED ORDER — DOCUSATE SODIUM 100 MG PO CAPS: 100.0000 mg | ORAL_CAPSULE | Freq: Every day | ORAL | Status: DC | PRN

## 2022-09-09 MED ORDER — ACETAMINOPHEN 500 MG PO TABS
1000.0000 mg | ORAL_TABLET | Freq: Four times a day (QID) | ORAL | Status: DC
  Administered 2022-09-09 – 2022-09-10 (×5): 1000 mg via ORAL
  Filled 2022-09-09 (×5): qty 2

## 2022-09-09 MED ORDER — FAMOTIDINE 20 MG PO TABS
10.0000 mg | ORAL_TABLET | Freq: Every day | ORAL | Status: DC
  Administered 2022-09-09: 10 mg via ORAL
  Filled 2022-09-09: qty 1

## 2022-09-09 NOTE — TOC CAGE-AID Note (Signed)
Transition of Care [facility]) - CAGE-AID Screening   Patient Details  Name: [name] [name] MRN: [id number] Date of Birth: [birth year]  Transition of [facility]) CM/SW Contact:    Katha Hamming, RN Phone Number: 09/09/2022, 9:58 PM     CAGE-AID Screening:    Have You Ever Felt You Ought to Cut Down on Your Drinking or Drug Use?: No Have People Annoyed You By [employer] Your Drinking Or Drug Use?: No Have You Felt Bad Or Guilty About Your Drinking Or Drug Use?: No Have You Ever Had a Drink or Used Drugs First Thing In The Morning to Steady Your Nerves or to Get Rid of a Hangover?: No CAGE-AID Score: 0  Substance Abuse Education Offered: No (hx tobacco abuse)

## 2022-09-09 NOTE — ED Notes (Signed)
ED TO INPATIENT HANDOFF REPORT  ED Nurse Name and Phone #: [phone]  S Name/Age/Gender [name] [name] 82 y.o. male Room/Bed: 029C/029C  Code Status   Code Status: Full Code  Home/SNF/Other Home Patient oriented to: self, place, time, and situation Is this baseline? Yes   Triage Complete: Triage complete  Chief Complaint Multiple rib fractures [S22.49XA]  Triage Note Patient had mechanical fall Friday evening, falling over a small threshold and landing on R side. Patient has pain to R arm, rib cage, and chest wall. Daughter reports some labored breathing and rattling noises with breathing.    Allergies Allergies  Allergen Reactions   Bupropion Hcl Er (Sr) Other (See Comments)    Low BP   Doxycycline Other (See Comments)    Dizziness   Flomax [Tamsulosin] Other (See Comments)    Dizziness    Lipitor [Atorvastatin] Other (See Comments)    Muscle weakness   Rhubarb (Rhein) Other (See Comments)    From childhood- exact reaction not recalled, but is allergic   Sulfa Antibiotics Hives   Sulfacetamide Sodium Hives   Clindamycin/Lincomycin Rash   Crestor [Rosuvastatin] Other (See Comments)    MUSCLE ACHES   Other Hives, Itching, Rash and Other (See Comments)    UNNAMED ANTIBIOTIC- SEVERE SYMPTOMS  [place]- From childhood- exact reaction not recalled, but is allergic   Vytorin [Ezetimibe-Simvastatin] Other (See Comments)    Bodyaches    Level of Care/Admitting Diagnosis ED Disposition     ED Disposition  Admit   Condition  --   Comment  Hospital Area: [facility] [[id number]]  Level of Care: Med-Surg [16]  May place patient in observation at [facility] or [facility] if equivalent level of care is available:: No  Covid Evaluation: Asymptomatic - no recent exposure (last 10 days) testing not required  Diagnosis: Multiple rib fractures [[id number]]  Admitting Physician: TRAUMA MD [[id number]]  Attending Physician: TRAUMA MD [[id number]]  For patients discharging to  extended facilities (i.e. SNF, AL, group homes or LTAC) initiate:: Discharge to SNF/Facility Placement COVID-19 Lab Testing Protocol          B Medical/Surgery History Past Medical History:  Diagnosis Date   Adenomatous polyp 2007   Anginal pain (HCC)    Chronic    Anxiety    Arthritis    "my body"   B12 deficiency    BPH (benign prostatic hyperplasia)    Chronic back pain    "all over"   Chronic low back pain    COPD (chronic obstructive pulmonary disease) (HCC)    Coronary artery disease 2009   DDD (degenerative disc disease), cervical    DDD (degenerative disc disease), lumbosacral    DDD (degenerative disc disease), thoracic    Depression    Gastroesophageal reflux    Gynecomastia 06/2009   History of kidney stones    Hyperglycemia    Hyperlipidemia LDL goal < 70    MI (myocardial infarction) (HCC) 08/1989; 1993   OSA on CPAP    "have mask; don't wear it" (02/02/2014) 09/25/15- now has a BiPAp- doesn'tt use it   Peptic ulcer disease 1959   Pre-diabetes    Right bundle branch block    Tobacco abuse    Vertebral compression fracture (HCC) 1970   Past Surgical History:  Procedure Laterality Date   BOTOX INJECTION  08/08/2020   Procedure: BOTOX INJECTION;  Surgeon: Vida Rigger, MD;  Location: [facility] ENDOSCOPY;  Service: Endoscopy;;   BOTOX INJECTION  06/11/2021  Procedure: BOTOX INJECTION;  Surgeon: Vida Rigger, MD;  Location: [facility] ENDOSCOPY;  Service: Gastroenterology;;   BYPASS GRAFT POPLITEAL TO POPLITEAL Left 06/2005   Dr Arbie Cookey   BYPASS GRAFT POPLITEAL TO POPLITEAL Right 02/2001   Dr Orson Slick   CARDIAC CATHETERIZATION  1991; 1993; 08/2012; 11/2013; 02/02/2014   I've had 6-7 total   CARDIAC CATHETERIZATION  02/02/2014   Procedure: LEFT HEART CATH AND CORONARY ANGIOGRAPHY;  Surgeon: Corky Crafts, MD;  mild disease in LAD/CFX, RCA w/ CTO, unsuccessfull PCI both w/ antegrade and retrograde approaches    CARPAL TUNNEL RELEASE Left 06/01/2019   Procedure: LEFT CARPAL TUNNEL  RELEASE;  Surgeon: Cindee Salt, MD;  Location: [facility];  Service: Orthopedics;  Laterality: Left;  FOREARM BLOCK   CATARACT EXTRACTION W/ INTRAOCULAR LENS  IMPLANT, BILATERAL Bilateral ~ 2012   COLONOSCOPY W/ POLYPECTOMY     CORONARY ANGIOPLASTY  1990's   CYSTOSCOPY WITH STENT PLACEMENT Right 11/19/2019   Procedure: CYSTOSCOPY WITH RIGHT URETERAL STENT PLACEMENT;  Surgeon: Crista Elliot, MD;  Location: [facility];  Service: Urology;  Laterality: Right;   CYSTOSCOPY/URETEROSCOPY/HOLMIUM LASER/STENT PLACEMENT Right 12/22/2019   Procedure: CYSTOSCOPY RIGHT URETEROSCOPY/HOLMIUM LASER/STENT PLACEMENT;  Surgeon: Crist Fat, MD;  Location: [facility] ORS;  Service: Urology;  Laterality: Right;   ESOPHAGOGASTRODUODENOSCOPY (EGD) WITH ESOPHAGEAL DILATION  11/2007   ESOPHAGOGASTRODUODENOSCOPY (EGD) WITH PROPOFOL N/A 09/28/2015   Procedure: ESOPHAGOGASTRODUODENOSCOPY (EGD) WITH PROPOFOL;  Surgeon: Vida Rigger, MD;  Location: [facility] ENDOSCOPY;  Service: Endoscopy;  Laterality: N/A;   ESOPHAGOGASTRODUODENOSCOPY (EGD) WITH PROPOFOL N/A 09/09/2017   Procedure: ESOPHAGOGASTRODUODENOSCOPY (EGD) WITH PROPOFOL;  Surgeon: Vida Rigger, MD;  Location: [facility] ENDOSCOPY;  Service: Endoscopy;  Laterality: N/A;   ESOPHAGOGASTRODUODENOSCOPY (EGD) WITH PROPOFOL N/A 03/09/2020   Procedure: ESOPHAGOGASTRODUODENOSCOPY (EGD) WITH PROPOFOL WITH DIL;  Surgeon: Vida Rigger, MD;  Location: [facility] ENDOSCOPY;  Service: Endoscopy;  Laterality: N/A;   ESOPHAGOGASTRODUODENOSCOPY (EGD) WITH PROPOFOL N/A 08/08/2020   Procedure: ESOPHAGOGASTRODUODENOSCOPY (EGD) WITH PROPOFOL;  Surgeon: Vida Rigger, MD;  Location: [facility] ENDOSCOPY;  Service: Endoscopy;  Laterality: N/A;  Probable Botox injection, possible dilatation   ESOPHAGOGASTRODUODENOSCOPY (EGD) WITH PROPOFOL N/A 06/11/2021   Procedure: ESOPHAGOGASTRODUODENOSCOPY (EGD) WITH PROPOFOL;  Surgeon: Vida Rigger, MD;  Location: [facility] ENDOSCOPY;  Service: Gastroenterology;  Laterality: N/A;   EYE  SURGERY Bilateral    JOINT REPLACEMENT     LEFT HEART CATH AND CORONARY ANGIOGRAPHY N/A 08/10/2018   Procedure: LEFT HEART CATH AND CORONARY ANGIOGRAPHY;  Surgeon: Lennette Bihari, MD;  Location: [facility] INVASIVE CV LAB;  Service: Cardiovascular;  Laterality: N/A;   LEFT HEART CATHETERIZATION WITH CORONARY ANGIOGRAM N/A 09/03/2012   Procedure: LEFT HEART CATHETERIZATION WITH CORONARY ANGIOGRAM;  Surgeon: Lesleigh Noe, MD;  Location: [facility] CATH LAB;  Service: Cardiovascular;  Laterality: N/A;   LEFT HEART CATHETERIZATION WITH CORONARY ANGIOGRAM N/A 12/01/2013   Procedure: LEFT HEART CATHETERIZATION WITH CORONARY ANGIOGRAM;  Surgeon: Lesleigh Noe, MD;  Location: [facility] CATH LAB;  Service: Cardiovascular;  Laterality: N/A;   LOWER EXTREMITY ANGIOGRAM Left 05/2005   L pop-pop BPG patent   NAILBED REPAIR Right 09/17/2016   Procedure: ABLATION NAILBED RIGHT INDEX;  Surgeon: Cindee Salt, MD;  Location: [facility];  Service: Orthopedics;  Laterality: Right;   NASAL SEPTUM SURGERY  1977   PENILE PROSTHESIS IMPLANT  1990's   SAVORY DILATION N/A 09/28/2015   Procedure: SAVORY DILATION;  Surgeon: Vida Rigger, MD;  Location: [facility] ENDOSCOPY;  Service: Endoscopy;  Laterality: N/A;  have balloons available  SAVORY DILATION N/A 09/09/2017   Procedure: SAVORY DILATION;  Surgeon: Vida Rigger, MD;  Location: [facility] ENDOSCOPY;  Service: Endoscopy;  Laterality: N/A;   SAVORY DILATION N/A 03/09/2020   Procedure: SAVORY DILATION;  Surgeon: Vida Rigger, MD;  Location: [facility] ENDOSCOPY;  Service: Endoscopy;  Laterality: N/A;   SCROTAL SURGERY  08/1999   pump revision/notes 07/11/2010   SKIN FULL THICKNESS GRAFT Right 09/17/2016   Procedure: SKIN GRAFT FROM UPPER ARM;  Surgeon: Cindee Salt, MD;  Location:  SURGERY CENTER;  Service: Orthopedics;  Laterality: Right;   TOTAL KNEE ARTHROPLASTY Left 01/2009     A IV Location/Drains/Wounds Patient Lines/Drains/Airways Status     Active Line/Drains/Airways     Name  Placement date Placement time Site Days   Peripheral IV 09/09/22 20 G Left Antecubital 09/09/22  0910  Antecubital  less than 1   Ureteral Drain/Stent Right ureter 6 Fr. 11/19/19  2156  Right ureter  1025   Ureteral Drain/Stent Right ureter 6 Fr. 12/22/19  0844  Right ureter  992   External Urinary Catheter 08/06/20  0030  --  764   Incision (Closed) 09/17/16 Hand Right 09/17/16  1117  -- 2183   Incision (Closed) 09/17/16 Arm Right 09/17/16  1117  -- 2183   Incision (Closed) 06/01/19 Hand Left 06/01/19  1053  -- 1196   Incision (Closed) 11/19/19 Penis Other (Comment) 11/19/19  2213  -- 1025            Intake/Output Last 24 hours No intake or output data in the 24 hours ending 09/09/22 1353  Labs/Imaging Results for orders placed or performed during the hospital encounter of 09/09/22 (from the past 48 hour(s))  CBC WITH DIFFERENTIAL     Status: Abnormal   Collection Time: 09/09/22  9:10 AM  Result Value Ref Range   WBC 8.8 4.0 - 10.5 K/uL   RBC 3.88 (L) 4.22 - 5.81 MIL/uL   Hemoglobin 12.8 (L) 13.0 - 17.0 g/dL   HCT 16.1 09.6 - 04.5 %   MCV 102.3 (H) 80.0 - 100.0 fL   MCH 33.0 26.0 - 34.0 pg   MCHC 32.2 30.0 - 36.0 g/dL   RDW 40.9 81.1 - 91.4 %   Platelets 172 150 - 400 K/uL   nRBC 0.0 0.0 - 0.2 %   Neutrophils Relative % 73 %   Neutro Abs 6.5 1.7 - 7.7 K/uL   Lymphocytes Relative 12 %   Lymphs Abs 1.0 0.7 - 4.0 K/uL   Monocytes Relative 12 %   Monocytes Absolute 1.0 0.1 - 1.0 K/uL   Eosinophils Relative 2 %   Eosinophils Absolute 0.2 0.0 - 0.5 K/uL   Basophils Relative 0 %   Basophils Absolute 0.0 0.0 - 0.1 K/uL   Immature Granulocytes 1 %   Abs Immature Granulocytes 0.04 0.00 - 0.07 K/uL    Comment: Performed at [facility], [address] [place], [place] [address]  Comprehensive metabolic panel     Status: Abnormal   Collection Time: 09/09/22  9:10 AM  Result Value Ref Range   Sodium 139 135 - 145 mmol/L   Potassium 4.5 3.5 - 5.1 mmol/L   Chloride 99 98  - 111 mmol/L   CO2 26 22 - 32 mmol/L   Glucose, Bld 135 (H) 70 - 99 mg/dL    Comment: Glucose reference range applies only to samples taken after fasting for at least 8 hours.   BUN 19 8 - 23 mg/dL  Creatinine, Ser 1.11 0.61 - 1.24 mg/dL   Calcium 9.6 8.9 - 09.8 mg/dL   Total Protein 7.0 6.5 - 8.1 g/dL   Albumin 3.2 (L) 3.5 - 5.0 g/dL   AST 22 15 - 41 U/L   ALT 20 0 - 44 U/L   Alkaline Phosphatase 75 38 - 126 U/L   Total Bilirubin 0.8 0.3 - 1.2 mg/dL   GFR, Estimated >11 >91 mL/min    Comment: (NOTE) Calculated using the CKD-EPI Creatinine Equation (2021)    Anion gap 14 5 - 15    Comment: Performed at [facility], [address] [place], [place] [address]  Urinalysis, Routine w reflex microscopic -Urine, Clean Catch     Status: Abnormal   Collection Time: 09/09/22 11:17 AM  Result Value Ref Range   Color, Urine YELLOW YELLOW   APPearance CLEAR CLEAR   Specific Gravity, Urine 1.026 1.005 - 1.030   pH 6.0 5.0 - 8.0   Glucose, UA NEGATIVE NEGATIVE mg/dL   Hgb urine dipstick NEGATIVE NEGATIVE   Bilirubin Urine NEGATIVE NEGATIVE   Ketones, ur NEGATIVE NEGATIVE mg/dL   Protein, ur NEGATIVE NEGATIVE mg/dL   Nitrite NEGATIVE NEGATIVE   Leukocytes,Ua TRACE (A) NEGATIVE   RBC / HPF 0-5 0 - 5 RBC/hpf   WBC, UA 11-20 0 - 5 WBC/hpf   Bacteria, UA NONE SEEN NONE SEEN   Squamous Epithelial / HPF 0-5 0 - 5 /HPF   Mucus PRESENT     Comment: Performed at [facility], [address] [place], [place] [address]   DG Humerus Right  Result Date: 09/09/2022 CLINICAL DATA:  Patient states he fell 3 days ago. EXAM: RIGHT HUMERUS - 2+ VIEW COMPARISON:  None Available. FINDINGS: There is no evidence of fracture or other focal bone lesions. Moderate acromioclavicular osteoarthritis. Soft tissues are unremarkable. IMPRESSION: No fracture or dislocation. Moderate acromioclavicular osteoarthritis. Electronically Signed   By: Larose Hires D.O.   On: 09/09/2022 13:11   CT HEAD WO  CONTRAST  Result Date: 09/09/2022 CLINICAL DATA:  Provided history: Head trauma moderate/severe. Polytrauma, blunt. Fall. EXAM: CT HEAD WITHOUT CONTRAST CT CERVICAL SPINE WITHOUT CONTRAST TECHNIQUE: Multidetector CT imaging of the head and cervical spine was performed following the standard protocol without intravenous contrast. Multiplanar CT image reconstructions of the cervical spine were also generated. RADIATION DOSE REDUCTION: This exam was performed according to the departmental dose-optimization program which includes automated exposure control, adjustment of the mA and/or kV according to patient size and/or use of iterative reconstruction technique. COMPARISON:  Head CT 06/06/2020. Cervical spine MRI 04/24/2022. Radiographs of the cervical spine 09/05/2021. CT chest/abdomen/pelvis 07/30/2020. FINDINGS: CT HEAD FINDINGS Brain: Moderate generalized cerebral atrophy. Patchy and ill-defined hypoattenuation within the cerebral white matter, nonspecific but compatible with mild chronic small vessel ischemic disease. There is no acute intracranial hemorrhage. No demarcated cortical infarct. No extra-axial fluid collection. No evidence of an intracranial mass. No midline shift. Vascular: No hyperdense vessel. Atherosclerotic calcifications. Skull: No calvarial fracture or aggressive osseous lesion. Sinuses/Orbits: No mass or acute finding within the imaged orbits. No significant paranasal sinus disease. CT CERVICAL SPINE FINDINGS Alignment: 2 mm C7-T1 grade 1 anterolisthesis. Slight grade 1 anterolisthesis at T1-T2 and T2-T3. Skull base and vertebrae: The basion-dental and atlanto-dental intervals are maintained.No evidence of acute fracture to the cervical spine. Mild chronic T2 superior endplate vertebral compression deformity, unchanged from the prior CT chest/abdomen/pelvis of 07/30/2020. Soft tissues and spinal canal: No prevertebral fluid or swelling. No  visible canal hematoma. Disc levels: Cervical  spondylosis with multilevel disc space narrowing, disc bulges/central disc protrusions, endplate spurring, uncovertebral hypertrophy and facet arthrosis. Disc space narrowing is greatest at C6-C7 (advanced at this level). No appreciable high-grade spinal canal stenosis. Multilevel bony neural foraminal narrowing. Osseous fusion across the disc space and right-sided facet ankylosis at C3-C4. Upper chest: No consolidation within the imaged lung apices. No visible pneumothorax. Other: Acute, displaced fractures of the posterior right third and fourth ribs. IMPRESSION: CT head: 1.  No evidence of an acute intracranial abnormality. 2. Mild chronic small vessel ischemic changes within the cerebral white matter. 3. Generalized cerebral atrophy. CT cervical spine: 1. No evidence of an acute cervical spine fracture. 2. Mild grade 1 anterolisthesis at C7-T1, T1-T2 and T2-T3 3. Cervical spondylosis as described. 4. At C3-C4, there is osseous fusion across the disc space and right-sided facet ankylosis. 5. Redemonstrated mild chronic T2 superior endplate vertebral compression deformity. 6. Acute, displaced fractures of the posterior right third and fourth ribs. Same day CT chest/abdomen/pelvis reported separately. Electronically Signed   By: Jackey Loge D.O.   On: 09/09/2022 11:19   CT CERVICAL SPINE WO CONTRAST  Result Date: 09/09/2022 CLINICAL DATA:  Provided history: Head trauma moderate/severe. Polytrauma, blunt. Fall. EXAM: CT HEAD WITHOUT CONTRAST CT CERVICAL SPINE WITHOUT CONTRAST TECHNIQUE: Multidetector CT imaging of the head and cervical spine was performed following the standard protocol without intravenous contrast. Multiplanar CT image reconstructions of the cervical spine were also generated. RADIATION DOSE REDUCTION: This exam was performed according to the departmental dose-optimization program which includes automated exposure control, adjustment of the mA and/or kV according to patient size and/or use of  iterative reconstruction technique. COMPARISON:  Head CT 06/06/2020. Cervical spine MRI 04/24/2022. Radiographs of the cervical spine 09/05/2021. CT chest/abdomen/pelvis 07/30/2020. FINDINGS: CT HEAD FINDINGS Brain: Moderate generalized cerebral atrophy. Patchy and ill-defined hypoattenuation within the cerebral white matter, nonspecific but compatible with mild chronic small vessel ischemic disease. There is no acute intracranial hemorrhage. No demarcated cortical infarct. No extra-axial fluid collection. No evidence of an intracranial mass. No midline shift. Vascular: No hyperdense vessel. Atherosclerotic calcifications. Skull: No calvarial fracture or aggressive osseous lesion. Sinuses/Orbits: No mass or acute finding within the imaged orbits. No significant paranasal sinus disease. CT CERVICAL SPINE FINDINGS Alignment: 2 mm C7-T1 grade 1 anterolisthesis. Slight grade 1 anterolisthesis at T1-T2 and T2-T3. Skull base and vertebrae: The basion-dental and atlanto-dental intervals are maintained.No evidence of acute fracture to the cervical spine. Mild chronic T2 superior endplate vertebral compression deformity, unchanged from the prior CT chest/abdomen/pelvis of 07/30/2020. Soft tissues and spinal canal: No prevertebral fluid or swelling. No visible canal hematoma. Disc levels: Cervical spondylosis with multilevel disc space narrowing, disc bulges/central disc protrusions, endplate spurring, uncovertebral hypertrophy and facet arthrosis. Disc space narrowing is greatest at C6-C7 (advanced at this level). No appreciable high-grade spinal canal stenosis. Multilevel bony neural foraminal narrowing. Osseous fusion across the disc space and right-sided facet ankylosis at C3-C4. Upper chest: No consolidation within the imaged lung apices. No visible pneumothorax. Other: Acute, displaced fractures of the posterior right third and fourth ribs. IMPRESSION: CT head: 1.  No evidence of an acute intracranial abnormality. 2.  Mild chronic small vessel ischemic changes within the cerebral white matter. 3. Generalized cerebral atrophy. CT cervical spine: 1. No evidence of an acute cervical spine fracture. 2. Mild grade 1 anterolisthesis at C7-T1, T1-T2 and T2-T3 3. Cervical spondylosis as described. 4. At C3-C4, there is osseous fusion across the  disc space and right-sided facet ankylosis. 5. Redemonstrated mild chronic T2 superior endplate vertebral compression deformity. 6. Acute, displaced fractures of the posterior right third and fourth ribs. Same day CT chest/abdomen/pelvis reported separately. Electronically Signed   By: Jackey Loge D.O.   On: 09/09/2022 11:19   CT CHEST ABDOMEN PELVIS W CONTRAST  Result Date: 09/09/2022 CLINICAL DATA:  Blunt trauma. EXAM: CT CHEST, ABDOMEN, AND PELVIS WITH CONTRAST TECHNIQUE: Multidetector CT imaging of the chest, abdomen and pelvis was performed following the standard protocol during bolus administration of intravenous contrast. RADIATION DOSE REDUCTION: This exam was performed according to the departmental dose-optimization program which includes automated exposure control, adjustment of the mA and/or kV according to patient size and/or use of iterative reconstruction technique. CONTRAST:  75mL OMNIPAQUE IOHEXOL 350 MG/ML SOLN COMPARISON:  July 30, 2020 FINDINGS: CT CHEST FINDINGS Cardiovascular: Normal heart size. No pericardial effusion. Calcific atherosclerotic disease of the coronary arteries and aorta. Mediastinum/Nodes: No enlarged mediastinal, hilar, or axillary lymph nodes. Thyroid gland, trachea, and esophagus demonstrate no significant findings. Lungs/Pleura: No evidence for pneumothorax. Bibasilar atelectasis. Minimal pleural thickening versus pleural effusions. Left pleural calcifications. Musculoskeletal: No chest wall mass or suspicious bone lesions identified. Minimally displaced fractures of the right lateral second, third, fourth, fifth, 6, 7 ribs. CT ABDOMEN PELVIS FINDINGS  Hepatobiliary: No hepatic injury or perihepatic hematoma. Gallbladder is unremarkable. Pancreas: Unremarkable. No pancreatic ductal dilatation or surrounding inflammatory changes. Spleen: No splenic injury or perisplenic hematoma. Adrenals/Urinary Tract: No adrenal hemorrhage or renal injury identified. Bladder is unremarkable. Bilateral renal cysts, the largest in the right kidney measuring 3.1 cm. Stomach/Bowel: Stomach is within normal limits. Appendix appears normal. No evidence of bowel wall thickening, distention, or inflammatory changes. Vascular/Lymphatic: Aortic atherosclerosis. No enlarged abdominal or pelvic lymph nodes. Reproductive: Coarse calcifications within the prostate gland, which is normal in size. Penile prosthesis in place. Other: No abdominal wall hernia or abnormality. No abdominopelvic ascites. Musculoskeletal: No fracture is seen. IMPRESSION: 1. Minimally displaced fractures of the right lateral second, third, fourth, fifth, sixth and seventh ribs. 2. No evidence of pneumothorax. 3. Bibasilar atelectasis. 4. Minimal pleural thickening versus pleural effusions. 5. Left pleural calcifications. 6. Calcific atherosclerotic disease of the coronary arteries and aorta. 7. Bilateral renal cysts. 8. Aortic atherosclerosis. Aortic Atherosclerosis (ICD10-I70.0). Electronically Signed   By: Ted Mcalpine M.D.   On: 09/09/2022 11:17   DG Pelvis Portable  Result Date: 09/09/2022 CLINICAL DATA:  fall EXAM: PORTABLE PELVIS 1-2 VIEWS COMPARISON:  None Available. FINDINGS: Pelvis is intact with normal and symmetric sacroiliac joints. No acute fracture or dislocation. No aggressive osseous lesion. Visualized sacral arcuate lines are unremarkable. There are changes of chronic pubic symphisitis. There are mild degenerative changes of bilateral hip joints without significant joint space narrowing. Osteophytosis of the superior acetabulum. No radiopaque foreign bodies.  Bilateral penile prosthesis  noted. IMPRESSION: 1. No acute fracture or dislocation. 2. Mild degenerative changes of bilateral hip joints. Electronically Signed   By: Jules Schick M.D.   On: 09/09/2022 09:55   DG Chest Portable 1 View  Result Date: 09/09/2022 CLINICAL DATA:  fall EXAM: PORTABLE CHEST 1 VIEW COMPARISON:  08/05/2020. FINDINGS: Low lung volume. Elevated right hemidiaphragm. Bilateral lung fields are otherwise clear. Bilateral costophrenic angles are clear. Normal cardio-mediastinal silhouette. Aortic knob calcifications noted. Mildly displaced fractures of posterolateral aspect of right third rib and lateral aspect of right fifth through seventh ribs noted. No pneumothorax. Probable old healed fracture of posterolateral left third rib seen. The soft  tissues are within normal limits. IMPRESSION: *Mildly displaced fractures of the right third and fifth through seventh ribs. No pneumothorax. Aortic Atherosclerosis (ICD10-I70.0). Electronically Signed   By: Jules Schick M.D.   On: 09/09/2022 09:53    Pending Labs Unresulted Labs (From admission, onward)     Start     Ordered   09/16/22 0500  Creatinine, serum  (enoxaparin (LOVENOX)    CrCl >/= 30 with major trauma, spinal cord injury, or selected orthopedic surgery)  Weekly,   R     Comments: while on enoxaparin therapy.    09/09/22 1248   09/10/22 0500  CBC  Tomorrow morning,   R        09/09/22 1248   09/10/22 0500  Basic metabolic panel  Tomorrow morning,   R        09/09/22 1248            Vitals/Pain Today's Vitals   09/09/22 1139 09/09/22 1158 09/09/22 1200 09/09/22 1216  BP:  129/82 129/82   Pulse:  70 68   Resp:  16 19   Temp:  (!) 97.5 F (36.4 C)    TempSrc:      SpO2:  95% 96%   PainSc: 8    7     Isolation Precautions No active isolations  Medications Medications  oxyCODONE (Oxy IR/ROXICODONE) immediate release tablet 2.5-5 mg (has no administration in time range)  morphine (PF) 2 MG/ML injection 1-2 mg (has no administration  in time range)  acetaminophen (TYLENOL) tablet 1,000 mg (1,000 mg Oral Given 09/09/22 1333)  docusate sodium (COLACE) capsule 100 mg (100 mg Oral Given 09/09/22 1333)  polyethylene glycol (MIRALAX / GLYCOLAX) packet 17 g (has no administration in time range)  ondansetron (ZOFRAN-ODT) disintegrating tablet 4 mg (has no administration in time range)    Or  ondansetron (ZOFRAN) injection 4 mg (has no administration in time range)  metoprolol tartrate (LOPRESSOR) injection 5 mg (has no administration in time range)  hydrALAZINE (APRESOLINE) injection 10 mg (has no administration in time range)  enoxaparin (LOVENOX) injection 30 mg (has no administration in time range)  methocarbamol (ROBAXIN) tablet 500 mg (has no administration in time range)  lidocaine (LIDODERM) 5 % 1 patch (1 patch Transdermal Patch Applied 09/09/22 1334)  umeclidinium-vilanterol (ANORO ELLIPTA) 62.5-25 MCG/ACT 1 puff (has no administration in time range)  cholecalciferol (VITAMIN D3) 25 MCG (1000 UNIT) tablet 2,000 Units (has no administration in time range)  docusate sodium (COLACE) capsule 100 mg (has no administration in time range)  donepezil (ARICEPT) tablet 10 mg (has no administration in time range)  famotidine (PEPCID) tablet 10 mg (has no administration in time range)  finasteride (PROSCAR) tablet 5 mg (has no administration in time range)  isosorbide mononitrate (IMDUR) 24 hr tablet 30 mg (has no administration in time range)  nitroGLYCERIN (NITROSTAT) SL tablet 0.4 mg (has no administration in time range)  polyethylene glycol powder (GLYCOLAX/MIRALAX) container 17 g (has no administration in time range)  ranolazine (RANEXA) 12 hr tablet 1,000 mg (has no administration in time range)  cyanocobalamin (VITAMIN B12) tablet 1,000 mcg (has no administration in time range)  fentaNYL (SUBLIMAZE) injection 25 mcg (25 mcg Intravenous Given 09/09/22 1021)  iohexol (OMNIPAQUE) 350 MG/ML injection 75 mL (75 mLs Intravenous  Contrast Given 09/09/22 1052)  HYDROmorphone (DILAUDID) injection 0.25 mg (0.25 mg Intravenous Given 09/09/22 1146)  ondansetron (ZOFRAN) injection 4 mg (4 mg Intravenous Given 09/09/22 1146)  ketorolac (TORADOL) 15 MG/ML injection 15 mg (  15 mg Intravenous Given 09/09/22 1333)    Mobility walks with device     Focused Assessments     R Recommendations: See Admitting Provider Note  Report given to:   Additional Notes:

## 2022-09-09 NOTE — ED Triage Notes (Signed)
Patient had mechanical fall Friday evening, falling over a small threshold and landing on R side. Patient has pain to R arm, rib cage, and chest wall. Daughter reports some labored breathing and rattling noises with breathing.

## 2022-09-09 NOTE — ED Provider Notes (Signed)
[place] EMERGENCY DEPARTMENT AT [facility] Provider Note   CSN: [id number] Arrival date & time: 09/09/22  5956     History  Chief Complaint  Patient presents with   [name] [name] is a 82 y.o. male with PMH as listed below who presents with fall, chest pain.  Patient had mechanical fall Friday evening, falling over a small threshold and landing on R side.  He did not hit his head or lose consciousness.  Patient has pain to R arm, rib cage, and chest wall. Daughter reports some labored breathing and rattling noises with breathing.  He has had difficulty getting himself up out of bed over the last couple of days and this morning was unable.  He actually slid from the bed to the floor.  He denies any pain in his head or neck but endorses right-sided chest and upper arm pain.  Worse with breathing.  Denies any cough fevers chills abdominal pain pelvic pain pain in lower extremities.   Past Medical History:  Diagnosis Date   Adenomatous polyp 2007   Anginal pain (HCC)    Chronic    Anxiety    Arthritis    "my body"   B12 deficiency    BPH (benign prostatic hyperplasia)    Chronic back pain    "all over"   Chronic low back pain    COPD (chronic obstructive pulmonary disease) (HCC)    Coronary artery disease 2009   DDD (degenerative disc disease), cervical    DDD (degenerative disc disease), lumbosacral    DDD (degenerative disc disease), thoracic    Depression    Gastroesophageal reflux    Gynecomastia 06/2009   History of kidney stones    Hyperglycemia    Hyperlipidemia LDL goal < 70    MI (myocardial infarction) (HCC) 08/1989; 1993   OSA on CPAP    "have mask; don't wear it" (02/02/2014) 09/25/15- now has a BiPAp- doesn'tt use it   Peptic ulcer disease 1959   Pre-diabetes    Right bundle branch block    Tobacco abuse    Vertebral compression fracture (HCC) 1970       Home Medications Prior to Admission medications   Medication Sig Start Date End  Date Taking? Authorizing Provider  acetaminophen (TYLENOL) 500 MG tablet Take 1,000 mg by mouth every 6 (six) hours as needed for mild pain.    [provider]  albuterol (VENTOLIN HFA) 108 (90 Base) MCG/ACT inhaler Inhale 2 puffs into the lungs every 6 (six) hours as needed for wheezing or shortness of breath.    [provider]  Ernestina Patches 62.5-25 MCG/ACT AEPB Inhale 1 puff into the lungs daily. 03/19/21   [provider]  aspirin EC 81 MG tablet Take 1 tablet (81 mg total) by mouth daily. Resume after 4 days. 06/17/15   Osvaldo Shipper, MD  Cholecalciferol (VITAMIN D3) 50 MCG (2000 UT) TABS Take 2,000 Units by mouth every morning.    [provider]  docusate sodium (COLACE) 100 MG capsule Take 100 mg by mouth daily as needed for mild constipation.    [provider]  donepezil (ARICEPT) 10 MG tablet Take 10 mg by mouth at bedtime.    [provider]  famotidine (PEPCID) 10 MG tablet Take 10 mg by mouth at bedtime.    [provider]  finasteride (PROSCAR) 5 MG tablet Take 5 mg by mouth at bedtime.    [provider]  HYDROcodone-acetaminophen (NORCO/VICODIN) 5-325 MG tablet Take 1 tablet by mouth every 6 (six) hours as needed for severe pain. 02/03/20   [provider]  isosorbide mononitrate (IMDUR) 30 MG 24 hr tablet Take 1 tablet (30 mg total) by mouth 2 (two) times daily. 04/15/22   Corky Crafts, MD  nitroGLYCERIN (NITROSTAT) 0.4 MG SL tablet Place 0.4 mg under the tongue See admin instructions. Dissolve 0.4 mg sublingually 10 minutes before each meal for esophageal spasms and as needed for chest pain    [provider]  pantoprazole (PROTONIX) 40 MG tablet Take 40 mg by mouth 2 (two) times daily with a meal.    [provider]  polyethylene glycol powder (GLYCOLAX/MIRALAX) 17 GM/SCOOP powder Take 17 g by mouth daily as needed for mild constipation (MIX AND DRINK).    [provider]  pravastatin (PRAVACHOL) 10 MG tablet Take 1 tablet (10 mg total) by mouth 3 (three) times a week. 03/06/22   Dyann Kief, PA-C  ranolazine (RANEXA) 1000 MG SR tablet TAKE ONE TABLET BY MOUTH TWICE DAILY 11/14/21   Lyn Records, MD  vitamin B-12 (CYANOCOBALAMIN) 1000 MCG tablet Take 1,000 mcg by mouth every morning.    [provider]      Allergies    Bupropion hcl er (sr), Doxycycline, Flomax [tamsulosin], Lipitor [atorvastatin], Rhubarb (rhein), Sulfa antibiotics, Sulfacetamide sodium, Clindamycin/lincomycin, Crestor [rosuvastatin], Other, and Vytorin [ezetimibe-simvastatin]    Review of Systems   Review of Systems A 10 point review of systems was performed and is negative unless otherwise reported in HPI.  Physical Exam Updated Vital Signs BP 129/82   Pulse 68   Temp (!) 97.5 F (36.4 C)   Resp 19   SpO2 96%  Physical Exam  PRIMARY SURVEY  Airway Airway intact  Breathing Bilateral breath sounds  Circulation Carotid/femoral pulses 2+ intact bilaterally  GCS E =  4 V =  5 M =  6 Total = 15  Environment All clothes removed      SECONDARY SURVEY  Gen: -NAD  HEENT: -Head: NCAT. Scalp is clear of lacerations or wounds. Skull is clear of deformities or depressions -Forehead: Normal -Midface: Stable -Eyes: No visible injury to eyelids or eye, PERRL, EOMI -Nose: No gross deformities -Mouth: No injuries to lips, tongue or teeth. No trismus or malposition -Ears: No auricular hematoma -Neck: Trachea is midline, no distended neck veins  Chest: -Tenderness to palpation with cracking palpated in R anterior chest, R lateral chest wall. No obvious deformities, bruising or crepitus to clavicles or chest -Normal chest expansion -Normal heart sounds, S1/S2 normal, no m/r/g -Rales/crackling heard throughout chest but mostly in R lower lung fields. No wheezes.  Abdomen: -No tenderness, bruising or penetrating injury  Pelvis: -Pelvis is stable and non-tender  Genital:   -No gross deformity or visible injury to perineum or external genitalia -No blood at urethral meatus -No incontinence  Extremities: Right Upper Extremity: -Large ecchymosis to R humerus with no deformities palpable. Soft compartments.  -Radial pulse intact RUE, cap refill good -Normal sensation -Normal ROM, good strength Left Upper Extremity: -No point tenderness, deformity or other signs of injury -Radial pulse intact LUE, cap refill good -Normal sensation -Normal ROM, good strength Right Lower Extremity: -No point tenderness, deformity or other signs of injury -DP intact RLE -Normal sensation -Normal ROM, good strength Left Lower Extremity: -No point tenderness, deformity or other signs of injury -DP intact LLE -Normal sensation -Normal ROM, good strength  Back/Spine: -No midline  C, T, or L spine tenderness or step-offs  Other: N/A     ED Results / Procedures / Treatments   Labs (all labs ordered are listed, but only abnormal results are displayed) Labs Reviewed  CBC WITH DIFFERENTIAL/PLATELET - Abnormal; Notable for the following components:      Result Value   RBC 3.88 (*)    Hemoglobin 12.8 (*)    MCV 102.3 (*)    All other components within normal limits  COMPREHENSIVE METABOLIC PANEL - Abnormal; Notable for the following components:   Glucose, Bld 135 (*)    Albumin 3.2 (*)    All other components within normal limits  URINALYSIS, ROUTINE W REFLEX MICROSCOPIC - Abnormal; Notable for the following components:   Leukocytes,Ua TRACE (*)    All other components within normal limits    EKG EKG Interpretation Date/Time:  Monday September 09 2022 08:56:45 EDT Ventricular Rate:  64 PR Interval:  210 QRS Duration:  157 QT Interval:  456 QTC Calculation: 471 R Axis:   89  Text Interpretation: Sinus rhythm Right bundle branch block Similar to prior EKGs Confirmed by Vivi Barrack [phone]) on 09/09/2022 9:05:46 AM  Radiology DG Humerus Right  Result Date:  09/09/2022 CLINICAL DATA:  Patient states he fell 3 days ago. EXAM: RIGHT HUMERUS - 2+ VIEW COMPARISON:  None Available. FINDINGS: There is no evidence of fracture or other focal bone lesions. Moderate acromioclavicular osteoarthritis. Soft tissues are unremarkable. IMPRESSION: No fracture or dislocation. Moderate acromioclavicular osteoarthritis. Electronically Signed   By: Larose Hires D.O.   On: 09/09/2022 13:11   CT HEAD WO CONTRAST  Result Date: 09/09/2022 CLINICAL DATA:  Provided history: Head trauma moderate/severe. Polytrauma, blunt. Fall. EXAM: CT HEAD WITHOUT CONTRAST CT CERVICAL SPINE WITHOUT CONTRAST TECHNIQUE: Multidetector CT imaging of the head and cervical spine was performed following the standard protocol without intravenous contrast. Multiplanar CT image reconstructions of the cervical spine were also generated. RADIATION DOSE REDUCTION: This exam was performed according to the departmental dose-optimization program which includes automated exposure control, adjustment of the mA and/or kV according to patient size and/or use of iterative reconstruction technique. COMPARISON:  Head CT 06/06/2020. Cervical spine MRI 04/24/2022. Radiographs of the cervical spine 09/05/2021. CT chest/abdomen/pelvis 07/30/2020. FINDINGS: CT HEAD FINDINGS Brain: Moderate generalized cerebral atrophy. Patchy and ill-defined hypoattenuation within the cerebral white matter, nonspecific but compatible with mild chronic small vessel ischemic disease. There is no acute intracranial hemorrhage. No demarcated cortical infarct. No extra-axial fluid collection. No evidence of an intracranial mass. No midline shift. Vascular: No hyperdense vessel. Atherosclerotic calcifications. Skull: No calvarial fracture or aggressive osseous lesion. Sinuses/Orbits: No mass or acute finding within the imaged orbits. No significant paranasal sinus disease. CT CERVICAL SPINE FINDINGS Alignment: 2 mm C7-T1 grade 1 anterolisthesis. Slight  grade 1 anterolisthesis at T1-T2 and T2-T3. Skull base and vertebrae: The basion-dental and atlanto-dental intervals are maintained.No evidence of acute fracture to the cervical spine. Mild chronic T2 superior endplate vertebral compression deformity, unchanged from the prior CT chest/abdomen/pelvis of 07/30/2020. Soft tissues and spinal canal: No prevertebral fluid or swelling. No visible canal hematoma. Disc levels: Cervical spondylosis with multilevel disc space narrowing, disc bulges/central disc protrusions, endplate spurring, uncovertebral hypertrophy and facet arthrosis. Disc space narrowing is greatest at C6-C7 (advanced at this level). No appreciable high-grade spinal canal stenosis. Multilevel bony neural foraminal narrowing. Osseous fusion across the disc space and right-sided facet ankylosis at C3-C4. Upper chest: No consolidation within the imaged lung apices. No visible pneumothorax. Other:  Acute, displaced fractures of the posterior right third and fourth ribs. IMPRESSION: CT head: 1.  No evidence of an acute intracranial abnormality. 2. Mild chronic small vessel ischemic changes within the cerebral white matter. 3. Generalized cerebral atrophy. CT cervical spine: 1. No evidence of an acute cervical spine fracture. 2. Mild grade 1 anterolisthesis at C7-T1, T1-T2 and T2-T3 3. Cervical spondylosis as described. 4. At C3-C4, there is osseous fusion across the disc space and right-sided facet ankylosis. 5. Redemonstrated mild chronic T2 superior endplate vertebral compression deformity. 6. Acute, displaced fractures of the posterior right third and fourth ribs. Same day CT chest/abdomen/pelvis reported separately. Electronically Signed   By: Jackey Loge D.O.   On: 09/09/2022 11:19   CT CERVICAL SPINE WO CONTRAST  Result Date: 09/09/2022 CLINICAL DATA:  Provided history: Head trauma moderate/severe. Polytrauma, blunt. Fall. EXAM: CT HEAD WITHOUT CONTRAST CT CERVICAL SPINE WITHOUT CONTRAST TECHNIQUE:  Multidetector CT imaging of the head and cervical spine was performed following the standard protocol without intravenous contrast. Multiplanar CT image reconstructions of the cervical spine were also generated. RADIATION DOSE REDUCTION: This exam was performed according to the departmental dose-optimization program which includes automated exposure control, adjustment of the mA and/or kV according to patient size and/or use of iterative reconstruction technique. COMPARISON:  Head CT 06/06/2020. Cervical spine MRI 04/24/2022. Radiographs of the cervical spine 09/05/2021. CT chest/abdomen/pelvis 07/30/2020. FINDINGS: CT HEAD FINDINGS Brain: Moderate generalized cerebral atrophy. Patchy and ill-defined hypoattenuation within the cerebral white matter, nonspecific but compatible with mild chronic small vessel ischemic disease. There is no acute intracranial hemorrhage. No demarcated cortical infarct. No extra-axial fluid collection. No evidence of an intracranial mass. No midline shift. Vascular: No hyperdense vessel. Atherosclerotic calcifications. Skull: No calvarial fracture or aggressive osseous lesion. Sinuses/Orbits: No mass or acute finding within the imaged orbits. No significant paranasal sinus disease. CT CERVICAL SPINE FINDINGS Alignment: 2 mm C7-T1 grade 1 anterolisthesis. Slight grade 1 anterolisthesis at T1-T2 and T2-T3. Skull base and vertebrae: The basion-dental and atlanto-dental intervals are maintained.No evidence of acute fracture to the cervical spine. Mild chronic T2 superior endplate vertebral compression deformity, unchanged from the prior CT chest/abdomen/pelvis of 07/30/2020. Soft tissues and spinal canal: No prevertebral fluid or swelling. No visible canal hematoma. Disc levels: Cervical spondylosis with multilevel disc space narrowing, disc bulges/central disc protrusions, endplate spurring, uncovertebral hypertrophy and facet arthrosis. Disc space narrowing is greatest at C6-C7 (advanced at  this level). No appreciable high-grade spinal canal stenosis. Multilevel bony neural foraminal narrowing. Osseous fusion across the disc space and right-sided facet ankylosis at C3-C4. Upper chest: No consolidation within the imaged lung apices. No visible pneumothorax. Other: Acute, displaced fractures of the posterior right third and fourth ribs. IMPRESSION: CT head: 1.  No evidence of an acute intracranial abnormality. 2. Mild chronic small vessel ischemic changes within the cerebral white matter. 3. Generalized cerebral atrophy. CT cervical spine: 1. No evidence of an acute cervical spine fracture. 2. Mild grade 1 anterolisthesis at C7-T1, T1-T2 and T2-T3 3. Cervical spondylosis as described. 4. At C3-C4, there is osseous fusion across the disc space and right-sided facet ankylosis. 5. Redemonstrated mild chronic T2 superior endplate vertebral compression deformity. 6. Acute, displaced fractures of the posterior right third and fourth ribs. Same day CT chest/abdomen/pelvis reported separately. Electronically Signed   By: Jackey Loge D.O.   On: 09/09/2022 11:19   CT CHEST ABDOMEN PELVIS W CONTRAST  Result Date: 09/09/2022 CLINICAL DATA:  Blunt trauma. EXAM: CT CHEST, ABDOMEN, AND PELVIS  WITH CONTRAST TECHNIQUE: Multidetector CT imaging of the chest, abdomen and pelvis was performed following the standard protocol during bolus administration of intravenous contrast. RADIATION DOSE REDUCTION: This exam was performed according to the departmental dose-optimization program which includes automated exposure control, adjustment of the mA and/or kV according to patient size and/or use of iterative reconstruction technique. CONTRAST:  75mL OMNIPAQUE IOHEXOL 350 MG/ML SOLN COMPARISON:  July 30, 2020 FINDINGS: CT CHEST FINDINGS Cardiovascular: Normal heart size. No pericardial effusion. Calcific atherosclerotic disease of the coronary arteries and aorta. Mediastinum/Nodes: No enlarged mediastinal, hilar, or axillary  lymph nodes. Thyroid gland, trachea, and esophagus demonstrate no significant findings. Lungs/Pleura: No evidence for pneumothorax. Bibasilar atelectasis. Minimal pleural thickening versus pleural effusions. Left pleural calcifications. Musculoskeletal: No chest wall mass or suspicious bone lesions identified. Minimally displaced fractures of the right lateral second, third, fourth, fifth, 6, 7 ribs. CT ABDOMEN PELVIS FINDINGS Hepatobiliary: No hepatic injury or perihepatic hematoma. Gallbladder is unremarkable. Pancreas: Unremarkable. No pancreatic ductal dilatation or surrounding inflammatory changes. Spleen: No splenic injury or perisplenic hematoma. Adrenals/Urinary Tract: No adrenal hemorrhage or renal injury identified. Bladder is unremarkable. Bilateral renal cysts, the largest in the right kidney measuring 3.1 cm. Stomach/Bowel: Stomach is within normal limits. Appendix appears normal. No evidence of bowel wall thickening, distention, or inflammatory changes. Vascular/Lymphatic: Aortic atherosclerosis. No enlarged abdominal or pelvic lymph nodes. Reproductive: Coarse calcifications within the prostate gland, which is normal in size. Penile prosthesis in place. Other: No abdominal wall hernia or abnormality. No abdominopelvic ascites. Musculoskeletal: No fracture is seen. IMPRESSION: 1. Minimally displaced fractures of the right lateral second, third, fourth, fifth, sixth and seventh ribs. 2. No evidence of pneumothorax. 3. Bibasilar atelectasis. 4. Minimal pleural thickening versus pleural effusions. 5. Left pleural calcifications. 6. Calcific atherosclerotic disease of the coronary arteries and aorta. 7. Bilateral renal cysts. 8. Aortic atherosclerosis. Aortic Atherosclerosis (ICD10-I70.0). Electronically Signed   By: Ted Mcalpine M.D.   On: 09/09/2022 11:17   DG Pelvis Portable  Result Date: 09/09/2022 CLINICAL DATA:  fall EXAM: PORTABLE PELVIS 1-2 VIEWS COMPARISON:  None Available. FINDINGS:  Pelvis is intact with normal and symmetric sacroiliac joints. No acute fracture or dislocation. No aggressive osseous lesion. Visualized sacral arcuate lines are unremarkable. There are changes of chronic pubic symphisitis. There are mild degenerative changes of bilateral hip joints without significant joint space narrowing. Osteophytosis of the superior acetabulum. No radiopaque foreign bodies.  Bilateral penile prosthesis noted. IMPRESSION: 1. No acute fracture or dislocation. 2. Mild degenerative changes of bilateral hip joints. Electronically Signed   By: Jules Schick M.D.   On: 09/09/2022 09:55   DG Chest Portable 1 View  Result Date: 09/09/2022 CLINICAL DATA:  fall EXAM: PORTABLE CHEST 1 VIEW COMPARISON:  08/05/2020. FINDINGS: Low lung volume. Elevated right hemidiaphragm. Bilateral lung fields are otherwise clear. Bilateral costophrenic angles are clear. Normal cardio-mediastinal silhouette. Aortic knob calcifications noted. Mildly displaced fractures of posterolateral aspect of right third rib and lateral aspect of right fifth through seventh ribs noted. No pneumothorax. Probable old healed fracture of posterolateral left third rib seen. The soft tissues are within normal limits. IMPRESSION: *Mildly displaced fractures of the right third and fifth through seventh ribs. No pneumothorax. Aortic Atherosclerosis (ICD10-I70.0). Electronically Signed   By: Jules Schick M.D.   On: 09/09/2022 09:53    Procedures Procedures    Medications Ordered in ED Medications  oxyCODONE (Oxy IR/ROXICODONE) immediate release tablet 2.5-5 mg (has no administration in time range)  morphine (PF) 2 MG/ML injection  1-2 mg (has no administration in time range)  ketorolac (TORADOL) 15 MG/ML injection 15 mg (has no administration in time range)  acetaminophen (TYLENOL) tablet 1,000 mg (has no administration in time range)  docusate sodium (COLACE) capsule 100 mg (has no administration in time range)  polyethylene  glycol (MIRALAX / GLYCOLAX) packet 17 g (has no administration in time range)  ondansetron (ZOFRAN-ODT) disintegrating tablet 4 mg (has no administration in time range)    Or  ondansetron (ZOFRAN) injection 4 mg (has no administration in time range)  metoprolol tartrate (LOPRESSOR) injection 5 mg (has no administration in time range)  hydrALAZINE (APRESOLINE) injection 10 mg (has no administration in time range)  enoxaparin (LOVENOX) injection 30 mg (has no administration in time range)  methocarbamol (ROBAXIN) tablet 500 mg (has no administration in time range)  lidocaine (LIDODERM) 5 % 1 patch (has no administration in time range)  umeclidinium-vilanterol (ANORO ELLIPTA) 62.5-25 MCG/ACT 1 puff (has no administration in time range)  cholecalciferol (VITAMIN D3) 25 MCG (1000 UNIT) tablet 2,000 Units (has no administration in time range)  docusate sodium (COLACE) capsule 100 mg (has no administration in time range)  donepezil (ARICEPT) tablet 10 mg (has no administration in time range)  famotidine (PEPCID) tablet 10 mg (has no administration in time range)  finasteride (PROSCAR) tablet 5 mg (has no administration in time range)  isosorbide mononitrate (IMDUR) 24 hr tablet 30 mg (has no administration in time range)  nitroGLYCERIN (NITROSTAT) SL tablet 0.4 mg (has no administration in time range)  polyethylene glycol powder (GLYCOLAX/MIRALAX) container 17 g (has no administration in time range)  ranolazine (RANEXA) 12 hr tablet 1,000 mg (has no administration in time range)  cyanocobalamin (VITAMIN B12) tablet 1,000 mcg (has no administration in time range)  fentaNYL (SUBLIMAZE) injection 25 mcg (25 mcg Intravenous Given 09/09/22 1021)  iohexol (OMNIPAQUE) 350 MG/ML injection 75 mL (75 mLs Intravenous Contrast Given 09/09/22 1052)  HYDROmorphone (DILAUDID) injection 0.25 mg (0.25 mg Intravenous Given 09/09/22 1146)  ondansetron (ZOFRAN) injection 4 mg (4 mg Intravenous Given 09/09/22 1146)    ED  Course/ Medical Decision Making/ A&P                          Medical Decision Making Amount and/or Complexity of Data Reviewed Labs: ordered. Decision-making details documented in ED Course. Radiology: ordered. Decision-making details documented in ED Course.  Risk Prescription drug management. Decision regarding hospitalization.    This patient presents to the ED for concern of fall, CP, this involves an extensive number of treatment options, and is a complaint that carries with it a high risk of complications and morbidity.  I considered the following differential and admission for this acute, potentially life threatening condition.   MDM:    DDX for trauma includes but is not limited to:  -Head Injury such as skull fx or ICH - did not report head trauma but given fall, age/trauma will scan head -Chest Injury and Abdominal Injury - Pt with TTP and palpable deformity indicating likely R sided rib fractures. Also consider hemo/pneumothorax, pleural effusion, post-traumatic PNA given crackles on R side -Fractures - R upper arm w/ significant ecchymosis but no deformities palpable   Clinical Course as of 09/09/22 1329  Mon Sep 09, 2022  0945 CBC WITH DIFFERENTIAL(!) Unremarkable in the context of this patient's presentation  [HN]  1135 CT CHEST ABDOMEN PELVIS W CONTRAST R lateral 2-7th rib fractures [HN]  1136 Consulted to trauma  for admission. Initially given fentanyl and now dilaudid/zofran for analgesia [HN]  1328 No humerus fracture. 6 rib fractures on the right. No hypoxia/tachypnea currently. Patient admitted to trauma service for overnight observation, PT/OT, IS. [HN]    Clinical Course User Index [HN] Loetta Rough, MD    Labs: I Ordered, and personally interpreted labs.  The pertinent results include:  those listed above  Imaging Studies ordered: I ordered imaging studies including CXR, PXR, CT C/A/P, CTH/C-spine I independently visualized and interpreted  imaging. I agree with the radiologist interpretation  Additional history obtained from daughter at bedside, chart review.    Cardiac Monitoring: The patient was maintained on a cardiac monitor.  I personally viewed and interpreted the cardiac monitored which showed an underlying rhythm of: NSR  Reevaluation: After the interventions noted above, I reevaluated the patient and found that they have :improved  Social Determinants of Health: Lives independently  Disposition:  Admitted to trauma  Co morbidities that complicate the patient evaluation  Past Medical History:  Diagnosis Date   Adenomatous polyp 2007   Anginal pain (HCC)    Chronic    Anxiety    Arthritis    "my body"   B12 deficiency    BPH (benign prostatic hyperplasia)    Chronic back pain    "all over"   Chronic low back pain    COPD (chronic obstructive pulmonary disease) (HCC)    Coronary artery disease 2009   DDD (degenerative disc disease), cervical    DDD (degenerative disc disease), lumbosacral    DDD (degenerative disc disease), thoracic    Depression    Gastroesophageal reflux    Gynecomastia 06/2009   History of kidney stones    Hyperglycemia    Hyperlipidemia LDL goal < 70    MI (myocardial infarction) (HCC) 08/1989; 1993   OSA on CPAP    "have mask; don't wear it" (02/02/2014) 09/25/15- now has a BiPAp- doesn'tt use it   Peptic ulcer disease 1959   Pre-diabetes    Right bundle branch block    Tobacco abuse    Vertebral compression fracture (HCC) 1970     Medicines Meds ordered this encounter  Medications   fentaNYL (SUBLIMAZE) injection 25 mcg   iohexol (OMNIPAQUE) 350 MG/ML injection 75 mL   HYDROmorphone (DILAUDID) injection 0.25 mg   ondansetron (ZOFRAN) injection 4 mg   DISCONTD: acetaminophen (TYLENOL) tablet 1,000 mg   DISCONTD: methocarbamol (ROBAXIN) tablet 1,000 mg   oxyCODONE (Oxy IR/ROXICODONE) immediate release tablet 2.5-5 mg   morphine (PF) 2 MG/ML injection 1-2 mg    DISCONTD: lidocaine (LIDODERM) 5 % 1 patch   ketorolac (TORADOL) 15 MG/ML injection 15 mg   acetaminophen (TYLENOL) tablet 1,000 mg   docusate sodium (COLACE) capsule 100 mg   polyethylene glycol (MIRALAX / GLYCOLAX) packet 17 g   OR Linked Order Group    ondansetron (ZOFRAN-ODT) disintegrating tablet 4 mg    ondansetron (ZOFRAN) injection 4 mg   metoprolol tartrate (LOPRESSOR) injection 5 mg   hydrALAZINE (APRESOLINE) injection 10 mg   enoxaparin (LOVENOX) injection 30 mg   methocarbamol (ROBAXIN) tablet 500 mg   lidocaine (LIDODERM) 5 % 1 patch   umeclidinium-vilanterol (ANORO ELLIPTA) 62.5-25 MCG/ACT 1 puff   cholecalciferol (VITAMIN D3) 25 MCG (1000 UNIT) tablet 2,000 Units   docusate sodium (COLACE) capsule 100 mg   donepezil (ARICEPT) tablet 10 mg   famotidine (PEPCID) tablet 10 mg   finasteride (PROSCAR) tablet 5 mg   isosorbide mononitrate (IMDUR) 24  hr tablet 30 mg   nitroGLYCERIN (NITROSTAT) SL tablet 0.4 mg   polyethylene glycol powder (GLYCOLAX/MIRALAX) container 17 g   ranolazine (RANEXA) 12 hr tablet 1,000 mg   cyanocobalamin (VITAMIN B12) tablet 1,000 mcg    I have reviewed the patients home medicines and have made adjustments as needed  Problem List / ED Course: Problem List Items Addressed This Visit   None Visit Diagnoses     Fall, initial encounter    -  Primary   Multiple fractures of ribs, right side, initial encounter for closed fracture                       This note was created using dictation software, which may contain spelling or grammatical errors.    Loetta Rough, MD 09/09/22 1329

## 2022-09-09 NOTE — Plan of Care (Signed)

## 2022-09-09 NOTE — Plan of Care (Signed)

## 2022-09-09 NOTE — H&P (Addendum)
H&P Note  [name] [name] 18-Feb-1941  [id number].    Requesting MD: Vivi Barrack, MD Chief Complaint/Reason for Consult: fall with rib fractures  HPI:  Patient is an 82 year old male who presented to the ED s/p Fall on 09/06/22 evening. Reportedly fell over a small threshold and landed on his right side on a stone patio. Denied striking head or LOC. Reports pain to right arm and ribs. His daughter noted some labored breathing and rattling noise with breathing. Increased difficulty with mobility over the weekend secondary to pain.   PMH significant for CAD, COPD, HLD, GERD with hx of PUD, pre-diabetes, chronic low back pain, OSA on CPAP, BPH, B12 deficiency, depression/anxiety. No current blood thinners. Several allergies detailed in chart. Patient takes chronic norco 5-325 mg q6h prn for chronic back pain, according to his daughter he takes it at night only.  The patients daughter tells me that he does have a remote history of gastric ulcers. He has also had to get an esophageal stricture balloon dilated in the past.  ROS: Negative other than HPI  Family History  Problem Relation Age of Onset   Diabetes Father 7       deceased   Peripheral vascular disease Father    Pneumonia Father    Alzheimer's disease Mother 32       deceased   Fibromyalgia Sister 34       deceased   Hypertension Son    Obesity Daughter    Diabetes Daughter     Past Medical History:  Diagnosis Date   Adenomatous polyp 2007   Anginal pain (HCC)    Chronic    Anxiety    Arthritis    "my body"   B12 deficiency    BPH (benign prostatic hyperplasia)    Chronic back pain    "all over"   Chronic low back pain    COPD (chronic obstructive pulmonary disease) (HCC)    Coronary artery disease 2009   DDD (degenerative disc disease), cervical    DDD (degenerative disc disease), lumbosacral    DDD (degenerative disc disease), thoracic    Depression    Gastroesophageal reflux    Gynecomastia 06/2009    History of kidney stones    Hyperglycemia    Hyperlipidemia LDL goal < 70    MI (myocardial infarction) (HCC) 08/1989; 1993   OSA on CPAP    "have mask; don't wear it" (02/02/2014) 09/25/15- now has a BiPAp- doesn'tt use it   Peptic ulcer disease 1959   Pre-diabetes    Right bundle branch block    Tobacco abuse    Vertebral compression fracture (HCC) 1970    Past Surgical History:  Procedure Laterality Date   BOTOX INJECTION  08/08/2020   Procedure: BOTOX INJECTION;  Surgeon: Vida Rigger, MD;  Location: [facility] ENDOSCOPY;  Service: Endoscopy;;   BOTOX INJECTION  06/11/2021   Procedure: BOTOX INJECTION;  Surgeon: Vida Rigger, MD;  Location: [facility] ENDOSCOPY;  Service: Gastroenterology;;   BYPASS GRAFT POPLITEAL TO POPLITEAL Left 06/2005   Dr Arbie Cookey   BYPASS GRAFT POPLITEAL TO POPLITEAL Right 02/2001   Dr Orson Slick   CARDIAC CATHETERIZATION  1991; 1993; 08/2012; 11/2013; 02/02/2014   I've had 6-7 total   CARDIAC CATHETERIZATION  02/02/2014   Procedure: LEFT HEART CATH AND CORONARY ANGIOGRAPHY;  Surgeon: Corky Crafts, MD;  mild disease in LAD/CFX, RCA w/ CTO, unsuccessfull PCI both w/ antegrade and retrograde approaches    CARPAL TUNNEL RELEASE  Left 06/01/2019   Procedure: LEFT CARPAL TUNNEL RELEASE;  Surgeon: Cindee Salt, MD;  Location: [facility];  Service: Orthopedics;  Laterality: Left;  FOREARM BLOCK   CATARACT EXTRACTION W/ INTRAOCULAR LENS  IMPLANT, BILATERAL Bilateral ~ 2012   COLONOSCOPY W/ POLYPECTOMY     CORONARY ANGIOPLASTY  1990's   CYSTOSCOPY WITH STENT PLACEMENT Right 11/19/2019   Procedure: CYSTOSCOPY WITH RIGHT URETERAL STENT PLACEMENT;  Surgeon: Crista Elliot, MD;  Location: [facility] OR;  Service: Urology;  Laterality: Right;   CYSTOSCOPY/URETEROSCOPY/HOLMIUM LASER/STENT PLACEMENT Right 12/22/2019   Procedure: CYSTOSCOPY RIGHT URETEROSCOPY/HOLMIUM LASER/STENT PLACEMENT;  Surgeon: Crist Fat, MD;  Location: [facility];  Service: Urology;  Laterality: Right;    ESOPHAGOGASTRODUODENOSCOPY (EGD) WITH ESOPHAGEAL DILATION  11/2007   ESOPHAGOGASTRODUODENOSCOPY (EGD) WITH PROPOFOL N/A 09/28/2015   Procedure: ESOPHAGOGASTRODUODENOSCOPY (EGD) WITH PROPOFOL;  Surgeon: Vida Rigger, MD;  Location: [facility] ENDOSCOPY;  Service: Endoscopy;  Laterality: N/A;   ESOPHAGOGASTRODUODENOSCOPY (EGD) WITH PROPOFOL N/A 09/09/2017   Procedure: ESOPHAGOGASTRODUODENOSCOPY (EGD) WITH PROPOFOL;  Surgeon: Vida Rigger, MD;  Location: [facility] ENDOSCOPY;  Service: Endoscopy;  Laterality: N/A;   ESOPHAGOGASTRODUODENOSCOPY (EGD) WITH PROPOFOL N/A 03/09/2020   Procedure: ESOPHAGOGASTRODUODENOSCOPY (EGD) WITH PROPOFOL WITH DIL;  Surgeon: Vida Rigger, MD;  Location: [facility] ENDOSCOPY;  Service: Endoscopy;  Laterality: N/A;   ESOPHAGOGASTRODUODENOSCOPY (EGD) WITH PROPOFOL N/A 08/08/2020   Procedure: ESOPHAGOGASTRODUODENOSCOPY (EGD) WITH PROPOFOL;  Surgeon: Vida Rigger, MD;  Location: [facility] ENDOSCOPY;  Service: Endoscopy;  Laterality: N/A;  Probable Botox injection, possible dilatation   ESOPHAGOGASTRODUODENOSCOPY (EGD) WITH PROPOFOL N/A 06/11/2021   Procedure: ESOPHAGOGASTRODUODENOSCOPY (EGD) WITH PROPOFOL;  Surgeon: Vida Rigger, MD;  Location: [facility] ENDOSCOPY;  Service: Gastroenterology;  Laterality: N/A;   EYE SURGERY Bilateral    JOINT REPLACEMENT     LEFT HEART CATH AND CORONARY ANGIOGRAPHY N/A 08/10/2018   Procedure: LEFT HEART CATH AND CORONARY ANGIOGRAPHY;  Surgeon: Lennette Bihari, MD;  Location: [facility];  Service: Cardiovascular;  Laterality: N/A;   LEFT HEART CATHETERIZATION WITH CORONARY ANGIOGRAM N/A 09/03/2012   Procedure: LEFT HEART CATHETERIZATION WITH CORONARY ANGIOGRAM;  Surgeon: Lesleigh Noe, MD;  Location: [facility] CATH LAB;  Service: Cardiovascular;  Laterality: N/A;   LEFT HEART CATHETERIZATION WITH CORONARY ANGIOGRAM N/A 12/01/2013   Procedure: LEFT HEART CATHETERIZATION WITH CORONARY ANGIOGRAM;  Surgeon: Lesleigh Noe, MD;  Location: [facility] CATH LAB;  Service: Cardiovascular;  Laterality:  N/A;   LOWER EXTREMITY ANGIOGRAM Left 05/2005   L pop-pop BPG patent   NAILBED REPAIR Right 09/17/2016   Procedure: ABLATION NAILBED RIGHT INDEX;  Surgeon: Cindee Salt, MD;  Location: [facility];  Service: Orthopedics;  Laterality: Right;   NASAL SEPTUM SURGERY  1977   PENILE PROSTHESIS IMPLANT  1990's   SAVORY DILATION N/A 09/28/2015   Procedure: SAVORY DILATION;  Surgeon: Vida Rigger, MD;  Location: [facility] ENDOSCOPY;  Service: Endoscopy;  Laterality: N/A;  have balloons available   SAVORY DILATION N/A 09/09/2017   Procedure: SAVORY DILATION;  Surgeon: Vida Rigger, MD;  Location: [facility] ENDOSCOPY;  Service: Endoscopy;  Laterality: N/A;   SAVORY DILATION N/A 03/09/2020   Procedure: SAVORY DILATION;  Surgeon: Vida Rigger, MD;  Location: [facility] ENDOSCOPY;  Service: Endoscopy;  Laterality: N/A;   SCROTAL SURGERY  08/1999   pump revision/notes 07/11/2010   SKIN FULL THICKNESS GRAFT Right 09/17/2016   Procedure: SKIN GRAFT FROM UPPER ARM;  Surgeon: Cindee Salt, MD;  Location: [facility];  Service: Orthopedics;  Laterality: Right;   TOTAL KNEE ARTHROPLASTY  Left 01/2009    Social History:  reports that he has quit smoking. His smoking use included cigarettes. He has a 14.8 pack-year smoking history. He has quit using smokeless tobacco.  His smokeless tobacco use included chew. He reports that he does not drink alcohol and does not use drugs.  Allergies:  Allergies  Allergen Reactions   Bupropion Hcl Er (Sr) Other (See Comments)    Low BP   Doxycycline Other (See Comments)    Dizziness   Flomax [Tamsulosin] Other (See Comments)    Dizziness    Lipitor [Atorvastatin] Other (See Comments)    Muscle weakness   Rhubarb ([place]hein) Other (See Comments)    From childhood- exact reaction not recalled, but is allergic   Sulfa Antibiotics Hives   Sulfacetamide Sodium Hives   Clindamycin/Lincomycin Rash   Crestor [Rosuvastatin] Other (See Comments)    MUSCLE ACHES   Other Hives, Itching,  Rash and Other (See Comments)    UNNAMED ANTIBIOTIC- SEVERE SYMPTOMS  [place]- From childhood- exact reaction not recalled, but is allergic   Vytorin [Ezetimibe-Simvastatin] Other (See Comments)    Bodyaches    (Not in a hospital admission)   Blood pressure (!) 154/75, pulse 64, temperature 98 F (36.7 C), temperature source Oral, resp. rate 18, SpO2 100%. Physical Exam:  General: pleasant, WD, elderaly male who is laying in bed in NAD HEENT: head is normocephalic, atraumatic.  Sclera are noninjected.  PERRL.  Ears and nose without any masses or lesions.  Mouth is pink and moist Heart: regular, rate, and rhythm.  Normal s1,s2. No obvious murmurs, gallops, or rubs noted.  No lower extremity edema  Lungs: coarse upper airway breath sounds, wheezing of the right lung field, no rales noted.  Respiratory effort nonlabored Abd: soft, protuberant, NT, ND, +BS, no masses, hernias, or organomegaly MS: all 4 extremities are symmetrical with no cyanosis, clubbing, or edema. RUE with ecchymosis  Skin: warm and dry with no masses, lesions, or rashes Neuro: Cranial nerves 2-12 grossly intact, sensation is normal throughout Psych: A&Ox3 with an appropriate affect.   Results for orders placed or performed during the hospital encounter of 09/09/22 (from the past 48 hour(s))  CBC WITH DIFFERENTIAL     Status: Abnormal   Collection Time: 09/09/22  9:10 AM  Result Value Ref Range   WBC 8.8 4.0 - 10.5 K/uL   RBC 3.88 (L) 4.22 - 5.81 MIL/uL   Hemoglobin 12.8 (L) 13.0 - 17.0 g/dL   HCT 88.4 16.6 - 06.3 %   MCV 102.3 (H) 80.0 - 100.0 fL   MCH 33.0 26.0 - 34.0 pg   MCHC 32.2 30.0 - 36.0 g/dL   RDW 01.6 01.0 - 93.2 %   Platelets 172 150 - 400 K/uL   nRBC 0.0 0.0 - 0.2 %   Neutrophils Relative % 73 %   Neutro Abs 6.5 1.7 - 7.7 K/uL   Lymphocytes Relative 12 %   Lymphs Abs 1.0 0.7 - 4.0 K/uL   Monocytes Relative 12 %   Monocytes Absolute 1.0 0.1 - 1.0 K/uL   Eosinophils Relative 2 %   Eosinophils  Absolute 0.2 0.0 - 0.5 K/uL   Basophils Relative 0 %   Basophils Absolute 0.0 0.0 - 0.1 K/uL   Immature Granulocytes 1 %   Abs Immature Granulocytes 0.04 0.00 - 0.07 K/uL    Comment: Performed at [facility], [address] [place], [place] [address]  Comprehensive metabolic panel     Status: Abnormal  Collection Time: 09/09/22  9:10 AM  Result Value Ref Range   Sodium 139 135 - 145 mmol/L   Potassium 4.5 3.5 - 5.1 mmol/L   Chloride 99 98 - 111 mmol/L   CO2 26 22 - 32 mmol/L   Glucose, Bld 135 (H) 70 - 99 mg/dL    Comment: Glucose reference range applies only to samples taken after fasting for at least 8 hours.   BUN 19 8 - 23 mg/dL   Creatinine, Ser 1.61 0.61 - 1.24 mg/dL   Calcium 9.6 8.9 - 09.6 mg/dL   Total Protein 7.0 6.5 - 8.1 g/dL   Albumin 3.2 (L) 3.5 - 5.0 g/dL   AST 22 15 - 41 U/L   ALT 20 0 - 44 U/L   Alkaline Phosphatase 75 38 - 126 U/L   Total Bilirubin 0.8 0.3 - 1.2 mg/dL   GFR, Estimated >04 >54 mL/min    Comment: (NOTE) Calculated using the CKD-EPI Creatinine Equation (2021)    Anion gap 14 5 - 15    Comment: Performed at [facility], [address] [place], [place] [address]   CT HEAD WO CONTRAST  Result Date: 09/09/2022 CLINICAL DATA:  Provided history: Head trauma moderate/severe. Polytrauma, blunt. Fall. EXAM: CT HEAD WITHOUT CONTRAST CT CERVICAL SPINE WITHOUT CONTRAST TECHNIQUE: Multidetector CT imaging of the head and cervical spine was performed following the standard protocol without intravenous contrast. Multiplanar CT image reconstructions of the cervical spine were also generated. RADIATION DOSE REDUCTION: This exam was performed according to the departmental dose-optimization program which includes automated exposure control, adjustment of the mA and/or kV according to patient size and/or use of iterative reconstruction technique. COMPARISON:  Head CT 06/06/2020. Cervical spine MRI 04/24/2022. Radiographs of the cervical spine 09/05/2021.  CT chest/abdomen/pelvis 07/30/2020. FINDINGS: CT HEAD FINDINGS Brain: Moderate generalized cerebral atrophy. Patchy and ill-defined hypoattenuation within the cerebral white matter, nonspecific but compatible with mild chronic small vessel ischemic disease. There is no acute intracranial hemorrhage. No demarcated cortical infarct. No extra-axial fluid collection. No evidence of an intracranial mass. No midline shift. Vascular: No hyperdense vessel. Atherosclerotic calcifications. Skull: No calvarial fracture or aggressive osseous lesion. Sinuses/Orbits: No mass or acute finding within the imaged orbits. No significant paranasal sinus disease. CT CERVICAL SPINE FINDINGS Alignment: 2 mm C7-T1 grade 1 anterolisthesis. Slight grade 1 anterolisthesis at T1-T2 and T2-T3. Skull base and vertebrae: The basion-dental and atlanto-dental intervals are maintained.No evidence of acute fracture to the cervical spine. Mild chronic T2 superior endplate vertebral compression deformity, unchanged from the prior CT chest/abdomen/pelvis of 07/30/2020. Soft tissues and spinal canal: No prevertebral fluid or swelling. No visible canal hematoma. Disc levels: Cervical spondylosis with multilevel disc space narrowing, disc bulges/central disc protrusions, endplate spurring, uncovertebral hypertrophy and facet arthrosis. Disc space narrowing is greatest at C6-C7 (advanced at this level). No appreciable high-grade spinal canal stenosis. Multilevel bony neural foraminal narrowing. Osseous fusion across the disc space and right-sided facet ankylosis at C3-C4. Upper chest: No consolidation within the imaged lung apices. No visible pneumothorax. Other: Acute, displaced fractures of the posterior right third and fourth ribs. IMPRESSION: CT head: 1.  No evidence of an acute intracranial abnormality. 2. Mild chronic small vessel ischemic changes within the cerebral white matter. 3. Generalized cerebral atrophy. CT cervical spine: 1. No evidence of  an acute cervical spine fracture. 2. Mild grade 1 anterolisthesis at C7-T1, T1-T2 and T2-T3 3. Cervical spondylosis as described. 4. At C3-C4, there is osseous fusion across the disc  space and right-sided facet ankylosis. 5. Redemonstrated mild chronic T2 superior endplate vertebral compression deformity. 6. Acute, displaced fractures of the posterior right third and fourth ribs. Same day CT chest/abdomen/pelvis reported separately. Electronically Signed   By: Jackey Loge D.O.   On: 09/09/2022 11:19   CT CERVICAL SPINE WO CONTRAST  Result Date: 09/09/2022 CLINICAL DATA:  Provided history: Head trauma moderate/severe. Polytrauma, blunt. Fall. EXAM: CT HEAD WITHOUT CONTRAST CT CERVICAL SPINE WITHOUT CONTRAST TECHNIQUE: Multidetector CT imaging of the head and cervical spine was performed following the standard protocol without intravenous contrast. Multiplanar CT image reconstructions of the cervical spine were also generated. RADIATION DOSE REDUCTION: This exam was performed according to the departmental dose-optimization program which includes automated exposure control, adjustment of the mA and/or kV according to patient size and/or use of iterative reconstruction technique. COMPARISON:  Head CT 06/06/2020. Cervical spine MRI 04/24/2022. Radiographs of the cervical spine 09/05/2021. CT chest/abdomen/pelvis 07/30/2020. FINDINGS: CT HEAD FINDINGS Brain: Moderate generalized cerebral atrophy. Patchy and ill-defined hypoattenuation within the cerebral white matter, nonspecific but compatible with mild chronic small vessel ischemic disease. There is no acute intracranial hemorrhage. No demarcated cortical infarct. No extra-axial fluid collection. No evidence of an intracranial mass. No midline shift. Vascular: No hyperdense vessel. Atherosclerotic calcifications. Skull: No calvarial fracture or aggressive osseous lesion. Sinuses/Orbits: No mass or acute finding within the imaged orbits. No significant paranasal  sinus disease. CT CERVICAL SPINE FINDINGS Alignment: 2 mm C7-T1 grade 1 anterolisthesis. Slight grade 1 anterolisthesis at T1-T2 and T2-T3. Skull base and vertebrae: The basion-dental and atlanto-dental intervals are maintained.No evidence of acute fracture to the cervical spine. Mild chronic T2 superior endplate vertebral compression deformity, unchanged from the prior CT chest/abdomen/pelvis of 07/30/2020. Soft tissues and spinal canal: No prevertebral fluid or swelling. No visible canal hematoma. Disc levels: Cervical spondylosis with multilevel disc space narrowing, disc bulges/central disc protrusions, endplate spurring, uncovertebral hypertrophy and facet arthrosis. Disc space narrowing is greatest at C6-C7 (advanced at this level). No appreciable high-grade spinal canal stenosis. Multilevel bony neural foraminal narrowing. Osseous fusion across the disc space and right-sided facet ankylosis at C3-C4. Upper chest: No consolidation within the imaged lung apices. No visible pneumothorax. Other: Acute, displaced fractures of the posterior right third and fourth ribs. IMPRESSION: CT head: 1.  No evidence of an acute intracranial abnormality. 2. Mild chronic small vessel ischemic changes within the cerebral white matter. 3. Generalized cerebral atrophy. CT cervical spine: 1. No evidence of an acute cervical spine fracture. 2. Mild grade 1 anterolisthesis at C7-T1, T1-T2 and T2-T3 3. Cervical spondylosis as described. 4. At C3-C4, there is osseous fusion across the disc space and right-sided facet ankylosis. 5. Redemonstrated mild chronic T2 superior endplate vertebral compression deformity. 6. Acute, displaced fractures of the posterior right third and fourth ribs. Same day CT chest/abdomen/pelvis reported separately. Electronically Signed   By: Jackey Loge D.O.   On: 09/09/2022 11:19   CT CHEST ABDOMEN PELVIS W CONTRAST  Result Date: 09/09/2022 CLINICAL DATA:  Blunt trauma. EXAM: CT CHEST, ABDOMEN, AND PELVIS  WITH CONTRAST TECHNIQUE: Multidetector CT imaging of the chest, abdomen and pelvis was performed following the standard protocol during bolus administration of intravenous contrast. RADIATION DOSE REDUCTION: This exam was performed according to the departmental dose-optimization program which includes automated exposure control, adjustment of the mA and/or kV according to patient size and/or use of iterative reconstruction technique. CONTRAST:  75mL OMNIPAQUE IOHEXOL 350 MG/ML SOLN COMPARISON:  July 30, 2020 FINDINGS: CT CHEST FINDINGS Cardiovascular:  Normal heart size. No pericardial effusion. Calcific atherosclerotic disease of the coronary arteries and aorta. Mediastinum/Nodes: No enlarged mediastinal, hilar, or axillary lymph nodes. Thyroid gland, trachea, and esophagus demonstrate no significant findings. Lungs/Pleura: No evidence for pneumothorax. Bibasilar atelectasis. Minimal pleural thickening versus pleural effusions. Left pleural calcifications. Musculoskeletal: No chest wall mass or suspicious bone lesions identified. Minimally displaced fractures of the right lateral second, third, fourth, fifth, 6, 7 ribs. CT ABDOMEN PELVIS FINDINGS Hepatobiliary: No hepatic injury or perihepatic hematoma. Gallbladder is unremarkable. Pancreas: Unremarkable. No pancreatic ductal dilatation or surrounding inflammatory changes. Spleen: No splenic injury or perisplenic hematoma. Adrenals/Urinary Tract: No adrenal hemorrhage or renal injury identified. Bladder is unremarkable. Bilateral renal cysts, the largest in the right kidney measuring 3.1 cm. Stomach/Bowel: Stomach is within normal limits. Appendix appears normal. No evidence of bowel wall thickening, distention, or inflammatory changes. Vascular/Lymphatic: Aortic atherosclerosis. No enlarged abdominal or pelvic lymph nodes. Reproductive: Coarse calcifications within the prostate gland, which is normal in size. Penile prosthesis in place. Other: No abdominal wall  hernia or abnormality. No abdominopelvic ascites. Musculoskeletal: No fracture is seen. IMPRESSION: 1. Minimally displaced fractures of the right lateral second, third, fourth, fifth, sixth and seventh ribs. 2. No evidence of pneumothorax. 3. Bibasilar atelectasis. 4. Minimal pleural thickening versus pleural effusions. 5. Left pleural calcifications. 6. Calcific atherosclerotic disease of the coronary arteries and aorta. 7. Bilateral renal cysts. 8. Aortic atherosclerosis. Aortic Atherosclerosis (ICD10-I70.0). Electronically Signed   By: Ted Mcalpine M.D.   On: 09/09/2022 11:17   DG Pelvis Portable  Result Date: 09/09/2022 CLINICAL DATA:  fall EXAM: PORTABLE PELVIS 1-2 VIEWS COMPARISON:  None Available. FINDINGS: Pelvis is intact with normal and symmetric sacroiliac joints. No acute fracture or dislocation. No aggressive osseous lesion. Visualized sacral arcuate lines are unremarkable. There are changes of chronic pubic symphisitis. There are mild degenerative changes of bilateral hip joints without significant joint space narrowing. Osteophytosis of the superior acetabulum. No radiopaque foreign bodies.  Bilateral penile prosthesis noted. IMPRESSION: 1. No acute fracture or dislocation. 2. Mild degenerative changes of bilateral hip joints. Electronically Signed   By: Jules Schick M.D.   On: 09/09/2022 09:55   DG Chest Portable 1 View  Result Date: 09/09/2022 CLINICAL DATA:  fall EXAM: PORTABLE CHEST 1 VIEW COMPARISON:  08/05/2020. FINDINGS: Low lung volume. Elevated right hemidiaphragm. Bilateral lung fields are otherwise clear. Bilateral costophrenic angles are clear. Normal cardio-mediastinal silhouette. Aortic knob calcifications noted. Mildly displaced fractures of posterolateral aspect of right third rib and lateral aspect of right fifth through seventh ribs noted. No pneumothorax. Probable old healed fracture of posterolateral left third rib seen. The soft tissues are within normal limits.  IMPRESSION: *Mildly displaced fractures of the right third and fifth through seventh ribs. No pneumothorax. Aortic Atherosclerosis (ICD10-I70.0). Electronically Signed   By: Jules Schick M.D.   On: 09/09/2022 09:53      Assessment/Plan Mechanical fall R 2-7 rib fractures Admit for multimodal pain control, PT/OT, repeat CXR in the AM.   R arm pain - x-ray pending   Admit to trauma service, med-surg, observation  CAD, CHF - home meds COPD - home meds, albuterol  Home aricept/namenda ordered, home proscar ordered   I reviewed ED provider notes, last 24 h vitals and pain scores, last 48 h intake and output, last 24 h labs and trends, and last 24 h imaging results.  Hosie Spangle, [facility] 09/09/2022, 11:59 AM Please see Amion for pager number during day hours 7:00am-4:30pm

## 2022-09-10 ENCOUNTER — Observation Stay (HOSPITAL_COMMUNITY): Payer: Medicare Other

## 2022-09-10 DIAGNOSIS — I25110 Atherosclerotic heart disease of native coronary artery with unstable angina pectoris: Secondary | ICD-10-CM | POA: Diagnosis not present

## 2022-09-10 DIAGNOSIS — S2241XA Multiple fractures of ribs, right side, initial encounter for closed fracture: Secondary | ICD-10-CM | POA: Diagnosis not present

## 2022-09-10 LAB — CBC
HCT: 33.6 % — ABNORMAL LOW (ref 39.0–52.0)
Hemoglobin: 10.7 g/dL — ABNORMAL LOW (ref 13.0–17.0)
MCH: 31.8 pg (ref 26.0–34.0)
MCHC: 31.8 g/dL (ref 30.0–36.0)
MCV: 100 fL (ref 80.0–100.0)
Platelets: 152 10*3/uL (ref 150–400)
RBC: 3.36 MIL/uL — ABNORMAL LOW (ref 4.22–5.81)
RDW: 13 % (ref 11.5–15.5)
WBC: 5.8 10*3/uL (ref 4.0–10.5)
nRBC: 0 % (ref 0.0–0.2)

## 2022-09-10 LAB — BASIC METABOLIC PANEL
Anion gap: 6 (ref 5–15)
BUN: 18 mg/dL (ref 8–23)
CO2: 27 mmol/L (ref 22–32)
Calcium: 8.7 mg/dL — ABNORMAL LOW (ref 8.9–10.3)
Chloride: 103 mmol/L (ref 98–111)
Creatinine, Ser: 1.23 mg/dL (ref 0.61–1.24)
GFR, Estimated: 59 mL/min — ABNORMAL LOW (ref >60)
Glucose, Bld: 101 mg/dL — ABNORMAL HIGH (ref 70–99)
Potassium: 4.1 mmol/L (ref 3.5–5.1)
Sodium: 136 mmol/L (ref 135–145)

## 2022-09-10 MED ORDER — OXYCODONE HCL 5 MG PO TABS: 2.5000 mg | ORAL_TABLET | ORAL | 0 refills | Status: AC | PRN

## 2022-09-10 MED ORDER — METHOCARBAMOL 500 MG PO TABS: 500.0000 mg | ORAL_TABLET | Freq: Three times a day (TID) | ORAL | 0 refills | Status: AC | PRN

## 2022-09-10 MED ORDER — ACETAMINOPHEN 500 MG PO TABS: 1000.0000 mg | ORAL_TABLET | Freq: Four times a day (QID) | ORAL | Status: AC | PRN

## 2022-09-10 NOTE — Progress Notes (Signed)
Subjective: CC: No pain today. No prn pain medication yesterday or today. Episode of hypoxia this am. On RA currently.  No sob. No other areas of pain. Tolerating diet without n/v. No abdominal pain. Voiding. Thinks he is about to have a bm. Has not mobilized yet. Uses a cane at baseline. Has support from his daughter and SIL that he lives at home with.   Objective: Vital signs in last 24 hours: Temp:  [97.5 F (36.4 C)-98.5 F (36.9 C)] 97.7 F (36.5 C) (07/16 0737) Pulse Rate:  [65-72] 69 (07/16 0737) Resp:  [16-19] 17 (07/16 0737) BP: (108-179)/(55-82) 176/73 (07/16 0737) SpO2:  [87 %-99 %] 87 % (07/16 0737)    Intake/Output from previous day: No intake/output data recorded. Intake/Output this shift: No intake/output data recorded.  PE: Gen:  Alert, NAD, pleasant HEENT: EOM's intact, pupils equal and round Card:  RRR Pulm:  CTAB, no W/R/R, effort normal. Will ask RN to bring IS and repeat o2 sat Abd: Soft, ND, NT Ext: MAE's without complaints. No LE edema.  Psych: A&Ox3   Lab Results:  Recent Labs    09/09/22 0910 09/10/22 0144  WBC 8.8 5.8  HGB 12.8* 10.7*  HCT 39.7 33.6*  PLT 172 152   BMET Recent Labs    09/09/22 0910 09/10/22 0144  NA 139 136  K 4.5 4.1  CL 99 103  CO2 26 27  GLUCOSE 135* 101*  BUN 19 18  CREATININE 1.11 1.23  CALCIUM 9.6 8.7*   PT/INR No results for input(s): "LABPROT", "INR" in the last 72 hours. CMP     Component Value Date/Time   NA 136 09/10/2022 0144   NA 141 03/05/2022 1040   K 4.1 09/10/2022 0144   CL 103 09/10/2022 0144   CO2 27 09/10/2022 0144   GLUCOSE 101 (H) 09/10/2022 0144   BUN 18 09/10/2022 0144   BUN 20 03/05/2022 1040   CREATININE 1.23 09/10/2022 0144   CALCIUM 8.7 (L) 09/10/2022 0144   PROT 7.0 09/09/2022 0910   PROT 6.7 03/05/2022 1040   ALBUMIN 3.2 (L) 09/09/2022 0910   ALBUMIN 3.8 03/05/2022 1040   AST 22 09/09/2022 0910   ALT 20 09/09/2022 0910   ALKPHOS 75 09/09/2022 0910   BILITOT  0.8 09/09/2022 0910   BILITOT 0.5 03/05/2022 1040   GFRNONAA 59 (L) 09/10/2022 0144   GFRAA >60 11/23/2019 0501   Lipase     Component Value Date/Time   LIPASE 21 07/30/2020 2039    Studies/Results: DG Humerus Right  Result Date: 09/09/2022 CLINICAL DATA:  Patient states he fell 3 days ago. EXAM: RIGHT HUMERUS - 2+ VIEW COMPARISON:  None Available. FINDINGS: There is no evidence of fracture or other focal bone lesions. Moderate acromioclavicular osteoarthritis. Soft tissues are unremarkable. IMPRESSION: No fracture or dislocation. Moderate acromioclavicular osteoarthritis. Electronically Signed   By: Larose Hires D.O.   On: 09/09/2022 13:11   CT HEAD WO CONTRAST  Result Date: 09/09/2022 CLINICAL DATA:  Provided history: Head trauma moderate/severe. Polytrauma, blunt. Fall. EXAM: CT HEAD WITHOUT CONTRAST CT CERVICAL SPINE WITHOUT CONTRAST TECHNIQUE: Multidetector CT imaging of the head and cervical spine was performed following the standard protocol without intravenous contrast. Multiplanar CT image reconstructions of the cervical spine were also generated. RADIATION DOSE REDUCTION: This exam was performed according to the departmental dose-optimization program which includes automated exposure control, adjustment of the mA and/or kV according to patient size and/or use of iterative reconstruction technique. COMPARISON:  Head CT 06/06/2020. Cervical spine MRI 04/24/2022. Radiographs of the cervical spine 09/05/2021. CT chest/abdomen/pelvis 07/30/2020. FINDINGS: CT HEAD FINDINGS Brain: Moderate generalized cerebral atrophy. Patchy and ill-defined hypoattenuation within the cerebral white matter, nonspecific but compatible with mild chronic small vessel ischemic disease. There is no acute intracranial hemorrhage. No demarcated cortical infarct. No extra-axial fluid collection. No evidence of an intracranial mass. No midline shift. Vascular: No hyperdense vessel. Atherosclerotic calcifications. Skull:  No calvarial fracture or aggressive osseous lesion. Sinuses/Orbits: No mass or acute finding within the imaged orbits. No significant paranasal sinus disease. CT CERVICAL SPINE FINDINGS Alignment: 2 mm C7-T1 grade 1 anterolisthesis. Slight grade 1 anterolisthesis at T1-T2 and T2-T3. Skull base and vertebrae: The basion-dental and atlanto-dental intervals are maintained.No evidence of acute fracture to the cervical spine. Mild chronic T2 superior endplate vertebral compression deformity, unchanged from the prior CT chest/abdomen/pelvis of 07/30/2020. Soft tissues and spinal canal: No prevertebral fluid or swelling. No visible canal hematoma. Disc levels: Cervical spondylosis with multilevel disc space narrowing, disc bulges/central disc protrusions, endplate spurring, uncovertebral hypertrophy and facet arthrosis. Disc space narrowing is greatest at C6-C7 (advanced at this level). No appreciable high-grade spinal canal stenosis. Multilevel bony neural foraminal narrowing. Osseous fusion across the disc space and right-sided facet ankylosis at C3-C4. Upper chest: No consolidation within the imaged lung apices. No visible pneumothorax. Other: Acute, displaced fractures of the posterior right third and fourth ribs. IMPRESSION: CT head: 1.  No evidence of an acute intracranial abnormality. 2. Mild chronic small vessel ischemic changes within the cerebral white matter. 3. Generalized cerebral atrophy. CT cervical spine: 1. No evidence of an acute cervical spine fracture. 2. Mild grade 1 anterolisthesis at C7-T1, T1-T2 and T2-T3 3. Cervical spondylosis as described. 4. At C3-C4, there is osseous fusion across the disc space and right-sided facet ankylosis. 5. Redemonstrated mild chronic T2 superior endplate vertebral compression deformity. 6. Acute, displaced fractures of the posterior right third and fourth ribs. Same day CT chest/abdomen/pelvis reported separately. Electronically Signed   By: Jackey Loge D.O.   On:  09/09/2022 11:19   CT CERVICAL SPINE WO CONTRAST  Result Date: 09/09/2022 CLINICAL DATA:  Provided history: Head trauma moderate/severe. Polytrauma, blunt. Fall. EXAM: CT HEAD WITHOUT CONTRAST CT CERVICAL SPINE WITHOUT CONTRAST TECHNIQUE: Multidetector CT imaging of the head and cervical spine was performed following the standard protocol without intravenous contrast. Multiplanar CT image reconstructions of the cervical spine were also generated. RADIATION DOSE REDUCTION: This exam was performed according to the departmental dose-optimization program which includes automated exposure control, adjustment of the mA and/or kV according to patient size and/or use of iterative reconstruction technique. COMPARISON:  Head CT 06/06/2020. Cervical spine MRI 04/24/2022. Radiographs of the cervical spine 09/05/2021. CT chest/abdomen/pelvis 07/30/2020. FINDINGS: CT HEAD FINDINGS Brain: Moderate generalized cerebral atrophy. Patchy and ill-defined hypoattenuation within the cerebral white matter, nonspecific but compatible with mild chronic small vessel ischemic disease. There is no acute intracranial hemorrhage. No demarcated cortical infarct. No extra-axial fluid collection. No evidence of an intracranial mass. No midline shift. Vascular: No hyperdense vessel. Atherosclerotic calcifications. Skull: No calvarial fracture or aggressive osseous lesion. Sinuses/Orbits: No mass or acute finding within the imaged orbits. No significant paranasal sinus disease. CT CERVICAL SPINE FINDINGS Alignment: 2 mm C7-T1 grade 1 anterolisthesis. Slight grade 1 anterolisthesis at T1-T2 and T2-T3. Skull base and vertebrae: The basion-dental and atlanto-dental intervals are maintained.No evidence of acute fracture to the cervical spine. Mild chronic T2 superior endplate vertebral compression deformity, unchanged from the prior  CT chest/abdomen/pelvis of 07/30/2020. Soft tissues and spinal canal: No prevertebral fluid or swelling. No visible  canal hematoma. Disc levels: Cervical spondylosis with multilevel disc space narrowing, disc bulges/central disc protrusions, endplate spurring, uncovertebral hypertrophy and facet arthrosis. Disc space narrowing is greatest at C6-C7 (advanced at this level). No appreciable high-grade spinal canal stenosis. Multilevel bony neural foraminal narrowing. Osseous fusion across the disc space and right-sided facet ankylosis at C3-C4. Upper chest: No consolidation within the imaged lung apices. No visible pneumothorax. Other: Acute, displaced fractures of the posterior right third and fourth ribs. IMPRESSION: CT head: 1.  No evidence of an acute intracranial abnormality. 2. Mild chronic small vessel ischemic changes within the cerebral white matter. 3. Generalized cerebral atrophy. CT cervical spine: 1. No evidence of an acute cervical spine fracture. 2. Mild grade 1 anterolisthesis at C7-T1, T1-T2 and T2-T3 3. Cervical spondylosis as described. 4. At C3-C4, there is osseous fusion across the disc space and right-sided facet ankylosis. 5. Redemonstrated mild chronic T2 superior endplate vertebral compression deformity. 6. Acute, displaced fractures of the posterior right third and fourth ribs. Same day CT chest/abdomen/pelvis reported separately. Electronically Signed   By: Jackey Loge D.O.   On: 09/09/2022 11:19   CT CHEST ABDOMEN PELVIS W CONTRAST  Result Date: 09/09/2022 CLINICAL DATA:  Blunt trauma. EXAM: CT CHEST, ABDOMEN, AND PELVIS WITH CONTRAST TECHNIQUE: Multidetector CT imaging of the chest, abdomen and pelvis was performed following the standard protocol during bolus administration of intravenous contrast. RADIATION DOSE REDUCTION: This exam was performed according to the departmental dose-optimization program which includes automated exposure control, adjustment of the mA and/or kV according to patient size and/or use of iterative reconstruction technique. CONTRAST:  75mL OMNIPAQUE IOHEXOL 350 MG/ML SOLN  COMPARISON:  July 30, 2020 FINDINGS: CT CHEST FINDINGS Cardiovascular: Normal heart size. No pericardial effusion. Calcific atherosclerotic disease of the coronary arteries and aorta. Mediastinum/Nodes: No enlarged mediastinal, hilar, or axillary lymph nodes. Thyroid gland, trachea, and esophagus demonstrate no significant findings. Lungs/Pleura: No evidence for pneumothorax. Bibasilar atelectasis. Minimal pleural thickening versus pleural effusions. Left pleural calcifications. Musculoskeletal: No chest wall mass or suspicious bone lesions identified. Minimally displaced fractures of the right lateral second, third, fourth, fifth, 6, 7 ribs. CT ABDOMEN PELVIS FINDINGS Hepatobiliary: No hepatic injury or perihepatic hematoma. Gallbladder is unremarkable. Pancreas: Unremarkable. No pancreatic ductal dilatation or surrounding inflammatory changes. Spleen: No splenic injury or perisplenic hematoma. Adrenals/Urinary Tract: No adrenal hemorrhage or renal injury identified. Bladder is unremarkable. Bilateral renal cysts, the largest in the right kidney measuring 3.1 cm. Stomach/Bowel: Stomach is within normal limits. Appendix appears normal. No evidence of bowel wall thickening, distention, or inflammatory changes. Vascular/Lymphatic: Aortic atherosclerosis. No enlarged abdominal or pelvic lymph nodes. Reproductive: Coarse calcifications within the prostate gland, which is normal in size. Penile prosthesis in place. Other: No abdominal wall hernia or abnormality. No abdominopelvic ascites. Musculoskeletal: No fracture is seen. IMPRESSION: 1. Minimally displaced fractures of the right lateral second, third, fourth, fifth, sixth and seventh ribs. 2. No evidence of pneumothorax. 3. Bibasilar atelectasis. 4. Minimal pleural thickening versus pleural effusions. 5. Left pleural calcifications. 6. Calcific atherosclerotic disease of the coronary arteries and aorta. 7. Bilateral renal cysts. 8. Aortic atherosclerosis. Aortic  Atherosclerosis (ICD10-I70.0). Electronically Signed   By: Ted Mcalpine M.D.   On: 09/09/2022 11:17   DG Pelvis Portable  Result Date: 09/09/2022 CLINICAL DATA:  fall EXAM: PORTABLE PELVIS 1-2 VIEWS COMPARISON:  None Available. FINDINGS: Pelvis is intact with normal and symmetric sacroiliac joints.  No acute fracture or dislocation. No aggressive osseous lesion. Visualized sacral arcuate lines are unremarkable. There are changes of chronic pubic symphisitis. There are mild degenerative changes of bilateral hip joints without significant joint space narrowing. Osteophytosis of the superior acetabulum. No radiopaque foreign bodies.  Bilateral penile prosthesis noted. IMPRESSION: 1. No acute fracture or dislocation. 2. Mild degenerative changes of bilateral hip joints. Electronically Signed   By: Jules Schick M.D.   On: 09/09/2022 09:55   DG Chest Portable 1 View  Result Date: 09/09/2022 CLINICAL DATA:  fall EXAM: PORTABLE CHEST 1 VIEW COMPARISON:  08/05/2020. FINDINGS: Low lung volume. Elevated right hemidiaphragm. Bilateral lung fields are otherwise clear. Bilateral costophrenic angles are clear. Normal cardio-mediastinal silhouette. Aortic knob calcifications noted. Mildly displaced fractures of posterolateral aspect of right third rib and lateral aspect of right fifth through seventh ribs noted. No pneumothorax. Probable old healed fracture of posterolateral left third rib seen. The soft tissues are within normal limits. IMPRESSION: *Mildly displaced fractures of the right third and fifth through seventh ribs. No pneumothorax. Aortic Atherosclerosis (ICD10-I70.0). Electronically Signed   By: Jules Schick M.D.   On: 09/09/2022 09:53    Anti-infectives: Anti-infectives (From admission, onward)    None        Assessment/Plan Mechanical fall R 2-7 rib fractures - multimodal pain control, PT/OT, repeat CXR pending this am. Desat this morning. Will ask RN to repeat vitals.  R arm pain -  xray negative.  CAD, CHF - home meds COPD - home meds, albuterol.  FEN - Reg, SLIV VTE - SCDs, Lovenox ID - None Plan - Therapies. If o2 sats ok (hx of COPD) and does well with therapies, possible d/c today.   I reviewed nursing notes, last 24 h vitals and pain scores, last 48 h intake and output, last 24 h labs and trends, and last 24 h imaging results.   LOS: 0 days    Jacinto Halim , [facility] 09/10/2022, 9:53 AM Please see Amion for pager number during day hours 7:00am-4:30pm
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
## 2022-09-10 NOTE — Progress Notes (Signed)
Discharge summary (AVS) packet provided to pt/daughter with instructions.Marland Kitchen Pt/daughter verbalized understanding of instructions. NO complaints. Pt d/c to home as ordered, Pt's daughter is responsible for pt's ride.

## 2022-09-10 NOTE — Plan of Care (Signed)

## 2022-09-10 NOTE — Evaluation (Signed)
Physical Therapy Evaluation Patient Details Name: [name] [name] MRN: [id number] DOB: Jul 24, 1940 Today's Date: 09/10/2022  History of Present Illness  [name] [name] is a 82 y.o. male with PMH of anxiety, arthritis, low back pain, COPD, coronary artery disease, DDD, GERD, hx of kidney stones, hyperglycemia, MI, OSA with BiPAP machine doesn't use it, who presents with fall, chest pain resulting in multiple fractures of ribs, right side.  Clinical Impression  Pt presents with admitting diagnosis above. Pt was not a great historian and most of subjective info taken from OT evaluation. Today, pt was able to ambulate in hallway with RW and navigate stairs Min G. Pt presents at or near baseline mobility. Pt has no further acute PT needs and will be signing off. Re consult PT if mobility status changes. Pt declined HHPT stating that he would like to do PT at a facility that he describes similar to an adult day care.        Assistance Recommended at Discharge Intermittent Supervision/Assistance  If plan is discharge home, recommend the following:  Can travel by private vehicle  A little help with walking and/or transfers;A little help with bathing/dressing/bathroom;Assistance with cooking/housework;Direct supervision/assist for medications management;Assist for transportation;Help with stairs or ramp for entrance        Equipment Recommendations None recommended by PT  Recommendations for Other Services       Functional Status Assessment Patient has had a recent decline in their functional status and demonstrates the ability to make significant improvements in function in a reasonable and predictable amount of time.     Precautions / Restrictions Precautions Precautions: Fall Precaution Comments: R rib fxs Restrictions Weight Bearing Restrictions: No      Mobility  Bed Mobility Overal bed mobility: Modified Independent Bed Mobility: Supine to Sit, Sit to Supine     Supine to sit:  Modified independent (Device/Increase time) Sit to supine: Modified independent (Device/Increase time)   General bed mobility comments: Increased time and use of bedrails    Transfers Overall transfer level: Needs assistance Equipment used: Rolling walker (2 wheels) Transfers: Sit to/from Stand Sit to Stand: Supervision           General transfer comment: Cues for hand placement    Ambulation/Gait Ambulation/Gait assistance: Min guard Gait Distance (Feet): 150 Feet Assistive device: Rolling walker (2 wheels) Gait Pattern/deviations: Decreased stride length, Step-through pattern, Trunk flexed Gait velocity: decreased     General Gait Details: no LOB noted. Pt likely at baseline  Stairs Stairs: Yes Stairs assistance: Min guard Stair Management: Two rails, Alternating pattern, Forwards Number of Stairs: 8 General stair comments: no LOB noted. Min G for safety and history of falls.  Wheelchair Mobility     Tilt Bed    Modified Rankin (Stroke Patients Only)       Balance Overall balance assessment: Needs assistance Sitting-balance support: Feet supported Sitting balance-Leahy Scale: Good Sitting balance - Comments: overall good sitting balance   Standing balance support: During functional activity, Reliant on assistive device for balance Standing balance-Leahy Scale: Poor Standing balance comment: Reliant on RW                             Pertinent Vitals/Pain Pain Assessment Pain Assessment: 0-10 Pain Score: 5  Pain Location: R ribs Pain Descriptors / Indicators: Aching, Discomfort, Grimacing Pain Intervention(s): Monitored during session, Limited activity within patient's tolerance, Repositioned    Home Living Family/patient expects to be discharged  to:: Private residence Living Arrangements: Children;Other relatives Available Help at Discharge: Family;Available 24 hours/day Type of Home: House Home Access: Level entry     Alternate  Level Stairs-Number of Steps: 12 Home Layout: Two level Home Equipment: Agricultural consultant (2 wheels);Rollator (4 wheels);Cane - single point;BSC/3in1;Shower seat Additional Comments: Pt lives at home with daughter/son and grand daughter    Prior Function Prior Level of Function : Needs assist       Physical Assist : Mobility (physical);ADLs (physical) Mobility (physical): Bed mobility;Gait ADLs (physical): Dressing;IADLs Mobility Comments: uses cane/walker around house and out in community ADLs Comments: needs help for bed mobility, socks/shoes     Hand Dominance        Extremity/Trunk Assessment   Upper Extremity Assessment Upper Extremity Assessment: RUE deficits/detail RUE Deficits / Details: B shoulder stiffness, limited ability to reach back of head and overhead activities RUE Coordination: decreased gross motor;decreased fine motor LUE Deficits / Details: B shoulder stiffness, limited ability to reach back of head and overhead activities LUE Coordination: decreased gross motor;decreased fine motor    Lower Extremity Assessment Lower Extremity Assessment: Overall WFL for tasks assessed    Cervical / Trunk Assessment Cervical / Trunk Assessment: Normal  Communication   Communication: No difficulties  Cognition Arousal/Alertness: Awake/alert Behavior During Therapy: WFL for tasks assessed/performed Overall Cognitive Status: No family/caregiver present to determine baseline cognitive functioning                                 General Comments: Pt PT A/Ox4 but inconsistent with which side pain was on, states he also fell 3 weeks ago then changed it to 1-2 years ago. Pt requires increased time to respond and is very conversational with every question/topic.        General Comments General comments (skin integrity, edema, etc.): VSS    Exercises     Assessment/Plan    PT Assessment Patient does not need any further PT services  PT Problem List          PT Treatment Interventions      PT Goals (Current goals can be found in the Care Plan section)  Acute Rehab PT Goals PT Goal Formulation: All assessment and education complete, DC therapy    Frequency       Co-evaluation               AM-PAC PT "6 Clicks" Mobility  Outcome Measure Help needed turning from your back to your side while in a flat bed without using bedrails?: None Help needed moving from lying on your back to sitting on the side of a flat bed without using bedrails?: None Help needed moving to and from a bed to a chair (including a wheelchair)?: A Little Help needed standing up from a chair using your arms (e.g., wheelchair or bedside chair)?: A Little Help needed to walk in hospital room?: A Little Help needed climbing 3-5 steps with a railing? : A Little 6 Click Score: 20    End of Session Equipment Utilized During Treatment: Gait belt Activity Tolerance: Patient tolerated treatment well Patient left: in bed;with call bell/phone within reach;with bed alarm set Nurse Communication: Mobility status PT Visit Diagnosis: Other abnormalities of gait and mobility (R26.89)    Time: 1610-9604 PT Time Calculation (min) (ACUTE ONLY): 22 min   Charges:   PT Evaluation $PT Eval Moderate Complexity: 1 Mod   PT General Charges $$ ACUTE  PT VISIT: 1 Visit         Shela Nevin, PT, DPT Acute Rehab Services [phone]   Gladys Damme 09/10/2022, 4:02 PM

## 2022-09-10 NOTE — Evaluation (Addendum)
Occupational Therapy Evaluation Patient Details Name: [name] [name] MRN: [id number] DOB: Aug 03, 1940 Today's Date: 09/10/2022   History of Present Illness [name] [name] is a 82 y.o. male with PMH of anxiety, arthritis, low back pain, COPD, coronary artery disease, DDD, GERD, hx of kidney stones, hyperglycemia, MI, OSA with BiPAP machine doesn't use it, who presents with fall, chest pain resulting in multiple fractures of ribs, right side.   Clinical Impression   Pt x/p above diagnosis. Pt doing well overall, some pain with bed mobility at R ribs, 5/10 with movement, 0/10 at rest. Pt at baseline needs assistance for LB dressing, bed mobility, uses RW/cane for mobility. Pt lives at home with daughter/son and granddaughter who assist as needed, bedroom on second floor. Pt A/O x4 but displayed some inconsistency with answers on assistance needed for ADLs, who he lived with, and time frame for falls. Pt O2 levels steady around 95 during session. Pt had LOB during ambulation, leaning backwards requiring assistance to maintain balance, using RW. Pt would benefit greatly from continued skilled OT to instruct on safety awareness, transfers, and compensatory strategies for ADLs. HHOT recommended to assess safety at home with transfers/mobility, and to maximize functional independence.     Recommendations for follow up therapy are one component of a multi-disciplinary discharge planning process, led by the attending physician.  Recommendations may be updated based on patient status, additional functional criteria and insurance authorization.   Assistance Recommended at Discharge Intermittent Supervision/Assistance  Patient can return home with the following A little help with walking and/or transfers;A little help with bathing/dressing/bathroom;Assistance with cooking/housework;Assist for transportation;Help with stairs or ramp for entrance    Functional Status Assessment  Patient has had a recent decline in  their functional status and demonstrates the ability to make significant improvements in function in a reasonable and predictable amount of time.  Equipment Recommendations  None recommended by OT    Recommendations for Other Services       Precautions / Restrictions Precautions Precautions: Fall Precaution Comments: R rib fxs Restrictions Weight Bearing Restrictions: No      Mobility Bed Mobility Overal bed mobility: Needs Assistance Bed Mobility: Supine to Sit, Sit to Supine     Supine to sit: Min assist, HOB elevated Sit to supine: Modified independent (Device/Increase time)   General bed mobility comments: needs help to power sitting up, able to perform sit to supine mod I    Transfers Overall transfer level: Needs assistance Equipment used: Rolling walker (2 wheels) Transfers: Sit to/from Stand, Bed to chair/wheelchair/BSC Sit to Stand: Min assist     Step pivot transfers: Min guard     General transfer comment: help with STS and to find balance, min guard with transfer      Balance Overall balance assessment: Needs assistance Sitting-balance support: Feet supported Sitting balance-Leahy Scale: Good Sitting balance - Comments: overall good sitting balance   Standing balance support: During functional activity, Reliant on assistive device for balance Standing balance-Leahy Scale: Poor Standing balance comment: not able to tolerate challenge, LOB a couple of times during ambulation falling backwards                           ADL either performed or assessed with clinical judgement   ADL Overall ADL's : Needs assistance/impaired;At baseline Eating/Feeding: Independent   Grooming: Set up;Sitting   Upper Body Bathing: Min guard;With adaptive equipment;Sitting   Lower Body Bathing: Min guard;With adaptive equipment;Sitting/lateral leans  Upper Body Dressing : Set up;Sitting   Lower Body Dressing: Minimal assistance;Sitting/lateral leans    Toilet Transfer: Min guard;Minimal assistance   Toileting- Clothing Manipulation and Hygiene: Min guard;Sit to/from stand       Functional mobility during ADLs: Min guard General ADL Comments: Pt likely at baseline, needs help with LB ADLs, inconsistent with sit to stand/transfers, min guard to min A.     Vision Baseline Vision/History: 1 Wears glasses Ability to See in Adequate Light: 0 Adequate Patient Visual Report: No change from baseline       Perception     Praxis      Pertinent Vitals/Pain Pain Assessment Pain Assessment: 0-10 Pain Score: 5  Pain Location: R ribs Pain Descriptors / Indicators: Aching, Discomfort, Grimacing Pain Intervention(s): Monitored during session     Hand Dominance     Extremity/Trunk Assessment Upper Extremity Assessment Upper Extremity Assessment: RUE deficits/detail;LUE deficits/detail RUE Deficits / Details: B shoulder stiffness, limited ability to reach back of head and overhead activities RUE Coordination: decreased gross motor;decreased fine motor LUE Deficits / Details: B shoulder stiffness, limited ability to reach back of head and overhead activities LUE Coordination: decreased gross motor;decreased fine motor   Lower Extremity Assessment Lower Extremity Assessment: Defer to PT evaluation       Communication Communication Communication: No difficulties   Cognition Arousal/Alertness: Awake/alert Behavior During Therapy: WFL for tasks assessed/performed Overall Cognitive Status: No family/caregiver present to determine baseline cognitive functioning                                 General Comments: Pt PT A/Ox4 but inconsistent with which side pain was on, states he also fell 3 weeks ago then changed it to 1-2 years ago. Pt requires increased time to respond and is very conversational with every question/topic.     General Comments       Exercises     Shoulder Instructions      Home Living  Family/patient expects to be discharged to:: Private residence Living Arrangements: Children;Other relatives Available Help at Discharge: Family;Available 24 hours/day Type of Home: House Home Access: Level entry     Home Layout: Two level Alternate Level Stairs-Number of Steps: 12 Alternate Level Stairs-Rails: Right Bathroom Shower/Tub: Tub/shower unit         Home Equipment: Agricultural consultant (2 wheels);Rollator (4 wheels);Cane - single point;BSC/3in1;Shower seat   Additional Comments: Pt lives at home with daughter/son and grand daughter      Prior Functioning/Environment Prior Level of Function : Needs assist       Physical Assist : Mobility (physical);ADLs (physical) Mobility (physical): Bed mobility;Gait ADLs (physical): Dressing;IADLs Mobility Comments: uses cane/walker around house and out in community ADLs Comments: needs help for bed mobility, socks/shoes        OT Problem List: Decreased strength;Decreased range of motion;Decreased activity tolerance;Impaired balance (sitting and/or standing);Decreased safety awareness;Pain;Impaired UE functional use      OT Treatment/Interventions: Self-care/ADL training;Therapeutic exercise;DME and/or AE instruction;Therapeutic activities;Patient/family education    OT Goals(Current goals can be found in the care plan section) Acute Rehab OT Goals Patient Stated Goal: to return home OT Goal Formulation: With patient Time For Goal Achievement: 09/24/22 Potential to Achieve Goals: Good  OT Frequency: Min 2X/week    Co-evaluation              AM-PAC OT "6 Clicks" Daily Activity     Outcome Measure Help from another  person eating meals?: None Help from another person taking care of personal grooming?: A Little Help from another person toileting, which includes using toliet, bedpan, or urinal?: A Little Help from another person bathing (including washing, rinsing, drying)?: A Little Help from another person to put on  and taking off regular upper body clothing?: A Little Help from another person to put on and taking off regular lower body clothing?: A Little 6 Click Score: 19   End of Session Equipment Utilized During Treatment: Gait belt;Rolling walker (2 wheels) Nurse Communication: Mobility status  Activity Tolerance: Patient tolerated treatment well Patient left: in bed;with call bell/phone within reach;with nursing/sitter in room  OT Visit Diagnosis: Unsteadiness on feet (R26.81);Other abnormalities of gait and mobility (R26.89);Repeated falls (R29.6);Muscle weakness (generalized) (M62.81);Pain Pain - Right/Left: Right Pain - part of body:  (ribs)                Time: 1100-1136 OT Time Calculation (min): 36 min Charges:  OT General Charges $OT Visit: 1 Visit OT Evaluation $OT Eval Low Complexity: 1 Low OT Treatments $Self Care/Home Management : 8-22 mins  [place], OTR/L   Alexis Goodell 09/10/2022, 11:52 AM

## 2022-12-25 ENCOUNTER — Other Ambulatory Visit: Payer: Self-pay | Admitting: Gastroenterology

## 2022-12-27 ENCOUNTER — Encounter (HOSPITAL_COMMUNITY): Payer: Self-pay | Admitting: Gastroenterology

## 2022-12-27 NOTE — Progress Notes (Signed)
Attempted to obtain medical history for pre op call via telephone, unable to reach at this time. HIPAA compliant voicemail message left requesting return call to pre surgical testing department.

## 2023-01-02 ENCOUNTER — Ambulatory Visit (HOSPITAL_COMMUNITY): Payer: Medicare (Managed Care) | Admitting: Anesthesiology

## 2023-01-02 ENCOUNTER — Ambulatory Visit (HOSPITAL_BASED_OUTPATIENT_CLINIC_OR_DEPARTMENT_OTHER): Payer: Medicare (Managed Care) | Admitting: Anesthesiology

## 2023-01-02 ENCOUNTER — Other Ambulatory Visit: Payer: Self-pay

## 2023-01-02 ENCOUNTER — Encounter (HOSPITAL_COMMUNITY)
Admission: RE | Disposition: A | Discharge status: Home / Self Care | Payer: Self-pay | Source: Home / Self Care | Attending: Gastroenterology

## 2023-01-02 ENCOUNTER — Ambulatory Visit (HOSPITAL_COMMUNITY)
Admission: RE | Admit: 2023-01-02 | Discharge: 2023-01-02 | Disposition: A | Discharge status: Home / Self Care | Payer: Medicare (Managed Care) | Attending: Gastroenterology | Admitting: Gastroenterology

## 2023-01-02 ENCOUNTER — Encounter (HOSPITAL_COMMUNITY): Payer: Self-pay | Admitting: Gastroenterology

## 2023-01-02 DIAGNOSIS — I2510 Atherosclerotic heart disease of native coronary artery without angina pectoris: Secondary | ICD-10-CM | POA: Diagnosis not present

## 2023-01-02 DIAGNOSIS — K220 Achalasia of cardia: Secondary | ICD-10-CM | POA: Diagnosis not present

## 2023-01-02 DIAGNOSIS — R1310 Dysphagia, unspecified: Secondary | ICD-10-CM | POA: Insufficient documentation

## 2023-01-02 HISTORY — PX: BOTOX INJECTION: SHX5754

## 2023-01-02 HISTORY — PX: ESOPHAGOGASTRODUODENOSCOPY: SHX5428

## 2023-01-02 SURGERY — EGD (ESOPHAGOGASTRODUODENOSCOPY)
Anesthesia: Monitor Anesthesia Care

## 2023-01-02 MED ORDER — PROPOFOL 500 MG/50ML IV EMUL
INTRAVENOUS | Status: DC | PRN
  Administered 2023-01-02: 100 mg via INTRAVENOUS

## 2023-01-02 MED ORDER — LIDOCAINE HCL (CARDIAC) PF 100 MG/5ML IV SOSY
PREFILLED_SYRINGE | INTRAVENOUS | Status: DC | PRN
Start: 2023-01-02 — End: 2023-01-02
  Administered 2023-01-02: 100 mg via INTRAVENOUS

## 2023-01-02 MED ORDER — PROPOFOL 10 MG/ML IV BOLUS
INTRAVENOUS | Status: DC | PRN
  Administered 2023-01-02: 100 mg via INTRAVENOUS

## 2023-01-02 MED ORDER — ONABOTULINUMTOXINA 100 UNITS IJ SOLR
INTRAMUSCULAR | Status: AC
  Filled 2023-01-02: qty 100

## 2023-01-02 MED ORDER — SODIUM CHLORIDE (PF) 0.9 % IJ SOLN
INTRAMUSCULAR | Status: DC | PRN
  Administered 2023-01-02: 4 mL via SUBMUCOSAL

## 2023-01-02 MED ORDER — ONDANSETRON HCL 4 MG/2ML IJ SOLN
INTRAMUSCULAR | Status: DC | PRN
  Administered 2023-01-02: 4 mg via INTRAVENOUS

## 2023-01-02 MED ORDER — SUCCINYLCHOLINE CHLORIDE 200 MG/10ML IV SOSY
PREFILLED_SYRINGE | INTRAVENOUS | Status: DC | PRN
  Administered 2023-01-02: 120 mg via INTRAVENOUS

## 2023-01-02 MED ORDER — PROPOFOL 500 MG/50ML IV EMUL
INTRAVENOUS | Status: AC
  Filled 2023-01-02: qty 50

## 2023-01-02 MED ORDER — LACTATED RINGERS IV SOLN: INTRAVENOUS | Status: DC | PRN

## 2023-01-02 MED ORDER — SODIUM CHLORIDE (PF) 0.9 % IJ SOLN
INTRAMUSCULAR | Status: AC
  Filled 2023-01-02: qty 10

## 2023-01-02 MED ORDER — PHENYLEPHRINE HCL (PRESSORS) 10 MG/ML IV SOLN
INTRAVENOUS | Status: DC | PRN
  Administered 2023-01-02: 80 ug via INTRAVENOUS

## 2023-01-02 MED ORDER — PROPOFOL 10 MG/ML IV BOLUS
INTRAVENOUS | Status: AC
  Filled 2023-01-02: qty 20

## 2023-01-02 NOTE — Anesthesia Procedure Notes (Signed)
Procedure Name: MAC Date/Time: 01/02/2023 10:28 AM  Performed by: Lily Lovings, CRNAPre-anesthesia Checklist: Patient identified, Emergency Drugs available, Suction available and Patient being monitored Patient Re-evaluated:Patient Re-evaluated prior to induction Oxygen Delivery Method: Simple face mask Preoxygenation: Pre-oxygenation with 100% oxygen Induction Type: IV induction Comments: POM

## 2023-01-02 NOTE — Op Note (Signed)
[facility] Patient Name: [name] [name] Procedure Date: 01/02/2023 MRN: [id number] Attending MD: Vida Rigger , MD, [id number] Date of Birth: 1940/07/03 CSN: [id number] Age: 82 Admit Type: Outpatient Procedure:                Upper GI endoscopy Indications:              Dysphagia, For therapy of achalasia Providers:                Vida Rigger, MD, Fransisca Connors, Salley Scarlet,                            Technician, Crystal Grant,CRNA Referring MD:              Medicines:                Monitored Anesthesia Care to start the procedure,                            but switched to General Anesthesia when increased                            food in stomach seen Complications:            No immediate complications. Estimated Blood Loss:     Estimated blood loss: none. Procedure:                Pre-Anesthesia Assessment:                           - Prior to the procedure, a History and Physical                            was performed, and patient medications and                            allergies were reviewed. The patient's tolerance of                            previous anesthesia was also reviewed. The risks                            and benefits of the procedure and the sedation                            options and risks were discussed with the patient.                            All questions were answered, and informed consent                            was obtained. Prior Anticoagulants: The patient has                            taken no anticoagulant or antiplatelet agents  except for aspirin. ASA Grade Assessment: III - A                            patient with severe systemic disease. After                            reviewing the risks and benefits, the patient was                            deemed in satisfactory condition to undergo the                            procedure.                           After obtaining informed  consent, the endoscope was                            passed under direct vision. Throughout the                            procedure, the patient's blood pressure, pulse, and                            oxygen saturations were monitored continuously. The                            [id number]-H190 ([id number] Olympus endoscope was introduced                            through the mouth, and advanced to the third part                            of duodenum. The upper GI endoscopy was                            accomplished without difficulty. The patient                            tolerated the procedure well. Scope In: Scope Out: Findings:      A hypertonic lower esophageal sphincter was found. There was mild       resistance to endoscope advancement into the stomach. The Z-line was       regular. The gastroesophageal junction and cardia were normal on       retroflexed view. Area was successfully injected with 100 units       botulinum toxin.      A medium amount of food (residue) was found in the gastric fundus, in       the gastric body and in the gastric antrum.      The duodenal bulb, first portion of the duodenum, second portion of the       duodenum and third portion of the duodenum were normal.      The cardia and gastric fundus were normal on retroflexion.      The exam was otherwise  without abnormality.      The larynx was normal. Impression:               - The esophageal examination was consistent with                            achalasia. Injected with botulinum toxin.                           - A medium amount of food (residue) in the stomach.                           - Normal duodenal bulb, first portion of the                            duodenum, second portion of the duodenum and third                            portion of the duodenum.                           - The examination was otherwise normal.                           - No specimens collected. Moderate Sedation:       Not Applicable - Patient had care per Anesthesia. Recommendation:           - Patient has a contact number available for                            emergencies. The signs and symptoms of potential                            delayed complications were discussed with the                            patient. Return to normal activities tomorrow.                            Written discharge instructions were provided to the                            patient.                           - Full liquid diet for 2 hours.                           - Continue present medications.                           - Return to GI clinic PRN. If he needs Botox in the                            future clear liquid diet today before                           -  Telephone GI clinic if symptomatic PRN. Procedure Code(s):        --- Professional ---                           [phone], Esophagogastroduodenoscopy, flexible,                            transoral; with directed submucosal injection(s),                            any substance Diagnosis Code(s):        --- Professional ---                           R13.10, Dysphagia, unspecified                           K22.0, Achalasia of cardia CPT copyright 2022 [employer]. All rights reserved. The codes documented in this report are preliminary and upon coder review may  be revised to meet current compliance requirements. Vida Rigger, MD 01/02/2023 11:05:07 AM This report has been signed electronically. Number of Addenda: 0

## 2023-01-02 NOTE — Addendum Note (Signed)
Addendum  created 01/02/23 1128 by Lannie Fields, DO   Attestation recorded in Intraprocedure, Clinical Note Signed, Intraprocedure Attestations deleted, Intraprocedure Attestations filed

## 2023-01-02 NOTE — Anesthesia Procedure Notes (Signed)
Procedure Name: Intubation Date/Time: 01/02/2023 10:38 AM  Performed by: Lily Lovings, CRNAPre-anesthesia Checklist: Patient identified, Patient being monitored, Timeout performed, Emergency Drugs available and Suction available Patient Re-evaluated:Patient Re-evaluated prior to induction Oxygen Delivery Method: Circle system utilized Preoxygenation: Pre-oxygenation with 100% oxygen Induction Type: IV induction, Rapid sequence and Cricoid Pressure applied Laryngoscope Size: Mac and 3 Grade View: Grade I Tube type: Oral Tube size: 7.5 mm Number of attempts: 1 Airway Equipment and Method: Stylet Placement Confirmation: ETT inserted through vocal cords under direct vision, positive ETCO2 and breath sounds checked- equal and bilateral Secured at: 21 cm Tube secured with: Tape Dental Injury: Teeth and Oropharynx as per pre-operative assessment

## 2023-01-02 NOTE — Anesthesia Postprocedure Evaluation (Addendum)
Anesthesia Post Note  Patient: [name] [name]  Procedure(s) Performed: ESOPHAGOGASTRODUODENOSCOPY (EGD) WITH BOTOX INJECTION BOTOX INJECTION     Patient location during evaluation: PACU Anesthesia Type: General Level of consciousness: awake and alert Pain management: pain level controlled Vital Signs Assessment: post-procedure vital signs reviewed and stable Respiratory status: spontaneous breathing, nonlabored ventilation and respiratory function stable Cardiovascular status: blood pressure returned to baseline and stable Postop Assessment: no apparent nausea or vomiting Anesthetic complications: no Comments: Converted to GA d/t full stomach upon scope insertion   No notable events documented.  Last Vitals:  Vitals:   01/02/23 1113 01/02/23 1123  BP: 129/69 (!) 121/57  Pulse: 67 69  Resp: 19 18  Temp:    SpO2: 96% 97%    Last Pain:  Vitals:   01/02/23 1123  TempSrc:   PainSc: 0-No pain                 Lannie Fields

## 2023-01-02 NOTE — Discharge Instructions (Addendum)
YOU HAD AN ENDOSCOPIC PROCEDURE TODAY: Refer to the procedure report and other information in the discharge instructions given to you for any specific questions about what was found during the examination. If this information does not answer your questions, please call [facility] office at [phone] to clarify.   YOU SHOULD EXPECT: Some feelings of bloating in the abdomen. Passage of more gas than usual. Walking can help get rid of the air that was put into your GI tract during the procedure and reduce the bloating. If you had a lower endoscopy (such as a colonoscopy or flexible sigmoidoscopy) you may notice spotting of blood in your stool or on the toilet paper. Some abdominal soreness may be present for a day or two, also.  DIET: Your first meal following the procedure should be a light meal and then it is ok to progress to your normal diet. A half-sandwich or bowl of soup is an example of a good first meal. Heavy or fried foods are harder to digest and may make you feel nauseous or bloated. Drink plenty of fluids but you should avoid alcoholic beverages for 24 hours. If you had a esophageal dilation, please see attached instructions for diet.    ACTIVITY: Your care partner should take you home directly after the procedure. You should plan to take it easy, moving slowly for the rest of the day. You can resume normal activity the day after the procedure however YOU SHOULD NOT DRIVE, use power tools, machinery or perform tasks that involve climbing or major physical exertion for 24 hours (because of the sedation medicines used during the test).   SYMPTOMS TO REPORT IMMEDIATELY: A gastroenterologist can be reached at any hour. Please call [phone]  for any of the following symptoms:  Following lower endoscopy (colonoscopy, flexible sigmoidoscopy) Excessive amounts of blood in the stool  Significant tenderness, worsening of abdominal pains  Swelling of the abdomen that is new, acute  Fever of 100  or higher  Following upper endoscopy (EGD, EUS, ERCP, esophageal dilation) Vomiting of blood or coffee ground material  New, significant abdominal pain  New, significant chest pain or pain under the shoulder blades  Painful or persistently difficult swallowing  New shortness of breath  Black, tarry-looking or red, bloody stools  FOLLOW UP:  If any biopsies were taken you will be contacted by phone or by letter within the next 1-3 weeks. Call [phone]  if you have not heard about the biopsies in 3 weeks.  Please also call with any specific questions about appointments or follow up tests. Begin with liquids only for 2 hours and if doing well may slowly advance diet and call me if question or problem otherwise follow-up in the office as needed

## 2023-01-02 NOTE — Transfer of Care (Signed)
Immediate Anesthesia Transfer of Care Note  Patient: [name] [name]  Procedure(s) Performed: ESOPHAGOGASTRODUODENOSCOPY (EGD) WITH BOTOX INJECTION BOTOX INJECTION  Patient Location: Endoscopy Unit  Anesthesia Type:General  Level of Consciousness: drowsy and patient cooperative  Airway & Oxygen Therapy: Patient Spontanous Breathing and Patient connected to face mask oxygen  Post-op Assessment: Report given to RN and Post -op Vital signs reviewed and stable  Post vital signs: Reviewed and stable  Last Vitals:  Vitals Value Taken Time  BP 118/51 01/02/23 1103  Temp 36.4 C 01/02/23 1103  Pulse 64 01/02/23 1103  Resp 16 01/02/23 1103  SpO2 97 % 01/02/23 1103    Last Pain:  Vitals:   01/02/23 1103  TempSrc: Tympanic  PainSc: Asleep         Complications: No notable events documented.

## 2023-01-02 NOTE — Anesthesia Preprocedure Evaluation (Addendum)
Anesthesia Evaluation  Patient identified by MRN, date of birth, ID band Patient awake    Reviewed: Allergy & Precautions, NPO status , Patient's Chart, lab work & pertinent test results  Airway Mallampati: III  TM Distance: >3 FB Neck ROM: Full    Dental  (+) Edentulous Upper   Pulmonary sleep apnea (noncompliant w/ bipap) , COPD,  COPD inhaler, former smoker   Pulmonary exam normal breath sounds clear to auscultation       Cardiovascular hypertension (128/52 preop), Pt. on medications + angina (chronic angina)  + CAD, + Past MI and +CHF  Normal cardiovascular exam Rhythm:Regular Rate:Normal  Echo 2022  1. Left ventricular ejection fraction, by estimation, is 55 to 60%. The  left ventricle has normal function. The left ventricle has no regional  wall motion abnormalities. Left ventricular diastolic parameters are  consistent with Grade I diastolic  dysfunction (impaired relaxation).   2. Right ventricular systolic function is normal. The right ventricular  size is normal. There is normal pulmonary artery systolic pressure. The  estimated right ventricular systolic pressure is 23.8 mmHg.   3. The mitral valve is normal in structure. No evidence of mitral valve  regurgitation. No evidence of mitral stenosis.   4. The aortic valve has an indeterminant number of cusps. Aortic valve  regurgitation is not visualized. Mild aortic valve sclerosis is present,  with no evidence of aortic valve stenosis.   5. The inferior vena cava is dilated in size with <50% respiratory  variability, suggesting right atrial pressure of 15 mmHg.   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  There is an 80% stenosis in the continuation branch between  the PDA and PLA takeoff.   Mild nonobstructive disease involving the LAD with 30% stenosis after the second diagonal vessel and normal left circumflex coronary artery.   Low normal LV function with EF estimated approximately 50%.  There is a focal area of inferobasilar akinesis.  The EDP is 18 mm Hg.      Neuro/Psych  PSYCHIATRIC DISORDERS Anxiety Depression    negative neurological ROS     GI/Hepatic Neg liver ROS, PUD,GERD  Medicated and Controlled,,dysphagia   Endo/Other  negative endocrine ROS    Renal/GU negative Renal ROS  negative genitourinary   Musculoskeletal  (+) Arthritis , Osteoarthritis,  Chronic LBP   Abdominal   Peds  Hematology negative hematology ROS (+)   Anesthesia Other Findings   Reproductive/Obstetrics negative OB ROS                             Anesthesia Physical Anesthesia Plan  ASA: 3  Anesthesia Plan: MAC   Post-op Pain Management:    Induction:   PONV Risk Score and Plan: 2 and Propofol infusion and TIVA  Airway Management Planned: Natural Airway and Simple Face Mask  Additional Equipment: None  Intra-op Plan:   Post-operative Plan:   Informed Consent: I have reviewed the patients History and Physical, chart, labs and discussed the procedure including the risks, benefits and alternatives for the proposed anesthesia with the patient or authorized representative who has indicated his/her understanding and acceptance.       Plan Discussed with: CRNA  Anesthesia Plan Comments: (Last injection 05/2021 under mac, no issues)  Anesthesia Quick Evaluation

## 2023-01-02 NOTE — Progress Notes (Signed)
Willaim Rayas 10:13 AM  Subjective: Patient doing well without any major medical problems since I saw him in the office and he said his previous Botox helped a lot but lately he has been having his swallowing problems again no other new complaints and he stopped his aspirin last week  Objective: No signs stable afebrile no acute distress exam please see preassessment evaluation  Assessment: Swallowing problems endoscopy looks like achalasia helped by Botox in the past  Plan: K to proceed with endoscopy and either Botox or dilation with anesthesia assistance  [facility]  office [phone] After 5PM or if no answer call [phone]

## 2023-01-05 ENCOUNTER — Encounter (HOSPITAL_COMMUNITY): Payer: Self-pay | Admitting: Gastroenterology
